# Patient Record
Sex: Female | Born: 1970 | Hispanic: Yes | Marital: Married | Smoking: Never smoker
Health system: Southern US, Community
[De-identification: ages and names within clinical notes are randomized; demographics above are authoritative.]

## PROBLEM LIST (undated history)

## (undated) DIAGNOSIS — N809 Endometriosis, unspecified: Secondary | ICD-10-CM

## (undated) DIAGNOSIS — N2889 Other specified disorders of kidney and ureter: Secondary | ICD-10-CM

## (undated) DIAGNOSIS — T4145XA Adverse effect of unspecified anesthetic, initial encounter: Secondary | ICD-10-CM

## (undated) DIAGNOSIS — R112 Nausea with vomiting, unspecified: Secondary | ICD-10-CM

## (undated) DIAGNOSIS — Z905 Acquired absence of kidney: Secondary | ICD-10-CM

## (undated) DIAGNOSIS — R002 Palpitations: Secondary | ICD-10-CM

## (undated) DIAGNOSIS — Z9889 Other specified postprocedural states: Secondary | ICD-10-CM

## (undated) DIAGNOSIS — M1990 Unspecified osteoarthritis, unspecified site: Secondary | ICD-10-CM

## (undated) DIAGNOSIS — R0602 Shortness of breath: Secondary | ICD-10-CM

## (undated) DIAGNOSIS — R5383 Other fatigue: Secondary | ICD-10-CM

## (undated) DIAGNOSIS — J45909 Unspecified asthma, uncomplicated: Secondary | ICD-10-CM

## (undated) DIAGNOSIS — K219 Gastro-esophageal reflux disease without esophagitis: Secondary | ICD-10-CM

## (undated) DIAGNOSIS — T8859XA Other complications of anesthesia, initial encounter: Secondary | ICD-10-CM

## (undated) HISTORY — DX: Other fatigue: R53.83

## (undated) HISTORY — PX: OTHER SURGICAL HISTORY: SHX169

## (undated) HISTORY — PX: CHOLECYSTECTOMY: SHX55

## (undated) HISTORY — DX: Other specified disorders of kidney and ureter: N28.89

## (undated) HISTORY — DX: Other specified postprocedural states: Z98.890

## (undated) HISTORY — PX: TUBAL LIGATION: SHX77

## (undated) HISTORY — DX: Other specified postprocedural states: R11.2

## (undated) HISTORY — DX: Palpitations: R00.2

## (undated) HISTORY — DX: Gastro-esophageal reflux disease without esophagitis: K21.9

## (undated) HISTORY — PX: TOTAL ABDOMINAL HYSTERECTOMY: SHX209

## (undated) HISTORY — DX: Shortness of breath: R06.02

## (undated) HISTORY — PX: CERVICAL DISC SURGERY: SHX588

## (undated) HISTORY — DX: Acquired absence of kidney: Z90.5

## (undated) HISTORY — PX: ABDOMINAL HYSTERECTOMY: SHX81

---

## 1998-08-31 ENCOUNTER — Other Ambulatory Visit: Admission: RE | Admit: 1998-08-31 | Discharge: 1998-08-31 | Payer: Self-pay | Admitting: Gynecology

## 1998-11-25 ENCOUNTER — Ambulatory Visit (HOSPITAL_COMMUNITY): Admission: RE | Admit: 1998-11-25 | Discharge: 1998-11-25 | Payer: Self-pay | Admitting: Gastroenterology

## 1999-07-25 ENCOUNTER — Other Ambulatory Visit: Admission: RE | Admit: 1999-07-25 | Discharge: 1999-07-25 | Payer: Self-pay | Admitting: Gynecology

## 1999-12-04 ENCOUNTER — Inpatient Hospital Stay (HOSPITAL_COMMUNITY): Admission: AD | Admit: 1999-12-04 | Discharge: 1999-12-04 | Payer: Self-pay | Admitting: Obstetrics and Gynecology

## 2000-01-15 ENCOUNTER — Inpatient Hospital Stay (HOSPITAL_COMMUNITY): Admission: AD | Admit: 2000-01-15 | Discharge: 2000-01-17 | Payer: Self-pay | Admitting: Obstetrics and Gynecology

## 2000-02-18 ENCOUNTER — Observation Stay (HOSPITAL_COMMUNITY): Admission: AD | Admit: 2000-02-18 | Discharge: 2000-02-18 | Payer: Self-pay | Admitting: Obstetrics & Gynecology

## 2000-02-20 ENCOUNTER — Ambulatory Visit (HOSPITAL_COMMUNITY): Admission: RE | Admit: 2000-02-20 | Discharge: 2000-02-20 | Payer: Self-pay | Admitting: Obstetrics & Gynecology

## 2000-02-22 ENCOUNTER — Inpatient Hospital Stay (HOSPITAL_COMMUNITY): Admission: AD | Admit: 2000-02-22 | Discharge: 2000-02-24 | Payer: Self-pay | Admitting: Obstetrics and Gynecology

## 2000-02-25 ENCOUNTER — Encounter: Admission: RE | Admit: 2000-02-25 | Discharge: 2000-05-25 | Payer: Self-pay | Admitting: Obstetrics & Gynecology

## 2000-05-26 ENCOUNTER — Encounter: Admission: RE | Admit: 2000-05-26 | Discharge: 2000-08-24 | Payer: Self-pay | Admitting: Obstetrics & Gynecology

## 2000-08-26 ENCOUNTER — Encounter: Admission: RE | Admit: 2000-08-26 | Discharge: 2000-11-24 | Payer: Self-pay | Admitting: Obstetrics & Gynecology

## 2001-02-16 ENCOUNTER — Encounter: Payer: Self-pay | Admitting: Emergency Medicine

## 2001-02-16 ENCOUNTER — Emergency Department (HOSPITAL_COMMUNITY): Admission: EM | Admit: 2001-02-16 | Discharge: 2001-02-16 | Payer: Self-pay | Admitting: Emergency Medicine

## 2001-04-24 ENCOUNTER — Inpatient Hospital Stay (HOSPITAL_COMMUNITY): Admission: AD | Admit: 2001-04-24 | Discharge: 2001-04-24 | Payer: Self-pay | Admitting: Obstetrics and Gynecology

## 2001-04-24 ENCOUNTER — Encounter: Payer: Self-pay | Admitting: Obstetrics and Gynecology

## 2001-04-24 ENCOUNTER — Ambulatory Visit (HOSPITAL_COMMUNITY): Admission: RE | Admit: 2001-04-24 | Discharge: 2001-04-24 | Payer: Self-pay | Admitting: Obstetrics and Gynecology

## 2001-04-26 ENCOUNTER — Ambulatory Visit (HOSPITAL_COMMUNITY): Admission: AD | Admit: 2001-04-26 | Discharge: 2001-04-26 | Payer: Self-pay | Admitting: *Deleted

## 2001-04-26 ENCOUNTER — Encounter (INDEPENDENT_AMBULATORY_CARE_PROVIDER_SITE_OTHER): Payer: Self-pay

## 2001-05-29 ENCOUNTER — Inpatient Hospital Stay (HOSPITAL_COMMUNITY): Admission: AD | Admit: 2001-05-29 | Discharge: 2001-05-29 | Payer: Self-pay | Admitting: Obstetrics and Gynecology

## 2001-06-05 ENCOUNTER — Encounter: Payer: Self-pay | Admitting: Obstetrics and Gynecology

## 2001-06-05 ENCOUNTER — Ambulatory Visit (HOSPITAL_COMMUNITY): Admission: RE | Admit: 2001-06-05 | Discharge: 2001-06-05 | Payer: Self-pay | Admitting: Obstetrics and Gynecology

## 2001-07-17 ENCOUNTER — Encounter (INDEPENDENT_AMBULATORY_CARE_PROVIDER_SITE_OTHER): Payer: Self-pay

## 2001-07-17 ENCOUNTER — Ambulatory Visit (HOSPITAL_COMMUNITY): Admission: AD | Admit: 2001-07-17 | Discharge: 2001-07-17 | Payer: Self-pay | Admitting: Obstetrics and Gynecology

## 2001-07-17 ENCOUNTER — Encounter: Payer: Self-pay | Admitting: Obstetrics and Gynecology

## 2001-11-05 HISTORY — PX: LAPAROSCOPIC ASSISTED VAGINAL HYSTERECTOMY: SHX5398

## 2002-06-09 ENCOUNTER — Observation Stay (HOSPITAL_COMMUNITY): Admission: RE | Admit: 2002-06-09 | Discharge: 2002-06-10 | Payer: Self-pay | Admitting: Obstetrics and Gynecology

## 2002-06-09 ENCOUNTER — Encounter (INDEPENDENT_AMBULATORY_CARE_PROVIDER_SITE_OTHER): Payer: Self-pay | Admitting: Specialist

## 2003-05-17 ENCOUNTER — Encounter (INDEPENDENT_AMBULATORY_CARE_PROVIDER_SITE_OTHER): Payer: Self-pay | Admitting: Specialist

## 2003-05-17 ENCOUNTER — Observation Stay (HOSPITAL_COMMUNITY): Admission: RE | Admit: 2003-05-17 | Discharge: 2003-05-18 | Payer: Self-pay | Admitting: Obstetrics and Gynecology

## 2003-05-26 ENCOUNTER — Ambulatory Visit (HOSPITAL_COMMUNITY): Admission: RE | Admit: 2003-05-26 | Discharge: 2003-05-26 | Payer: Self-pay | Admitting: Obstetrics and Gynecology

## 2003-05-26 ENCOUNTER — Encounter: Payer: Self-pay | Admitting: Obstetrics and Gynecology

## 2003-07-09 ENCOUNTER — Ambulatory Visit (HOSPITAL_COMMUNITY): Admission: RE | Admit: 2003-07-09 | Discharge: 2003-07-09 | Payer: Self-pay | Admitting: Gastroenterology

## 2003-07-09 ENCOUNTER — Encounter: Payer: Self-pay | Admitting: Gastroenterology

## 2004-02-28 ENCOUNTER — Encounter: Admission: RE | Admit: 2004-02-28 | Discharge: 2004-02-28 | Payer: Self-pay | Admitting: *Deleted

## 2004-08-25 ENCOUNTER — Emergency Department (HOSPITAL_COMMUNITY): Admission: EM | Admit: 2004-08-25 | Discharge: 2004-08-25 | Payer: Self-pay | Admitting: Family Medicine

## 2004-08-28 ENCOUNTER — Emergency Department (HOSPITAL_COMMUNITY): Admission: EM | Admit: 2004-08-28 | Discharge: 2004-08-28 | Payer: Self-pay | Admitting: Family Medicine

## 2004-11-14 ENCOUNTER — Ambulatory Visit: Payer: Self-pay | Admitting: Internal Medicine

## 2004-11-28 ENCOUNTER — Ambulatory Visit: Payer: Self-pay | Admitting: Gastroenterology

## 2004-12-01 ENCOUNTER — Ambulatory Visit: Payer: Self-pay | Admitting: Internal Medicine

## 2004-12-22 ENCOUNTER — Ambulatory Visit: Payer: Self-pay | Admitting: Gastroenterology

## 2004-12-22 ENCOUNTER — Encounter: Payer: Self-pay | Admitting: Internal Medicine

## 2005-01-25 ENCOUNTER — Ambulatory Visit: Payer: Self-pay | Admitting: Internal Medicine

## 2005-03-27 ENCOUNTER — Ambulatory Visit: Payer: Self-pay | Admitting: Internal Medicine

## 2005-08-03 ENCOUNTER — Ambulatory Visit (HOSPITAL_COMMUNITY): Admission: RE | Admit: 2005-08-03 | Discharge: 2005-08-04 | Payer: Self-pay | Admitting: Neurological Surgery

## 2005-08-28 ENCOUNTER — Encounter: Admission: RE | Admit: 2005-08-28 | Discharge: 2005-08-28 | Payer: Self-pay | Admitting: Neurological Surgery

## 2005-08-30 ENCOUNTER — Ambulatory Visit: Payer: Self-pay | Admitting: Internal Medicine

## 2005-11-22 ENCOUNTER — Encounter: Admission: RE | Admit: 2005-11-22 | Discharge: 2005-11-22 | Payer: Self-pay | Admitting: Neurological Surgery

## 2005-11-30 ENCOUNTER — Encounter: Admission: RE | Admit: 2005-11-30 | Discharge: 2005-11-30 | Payer: Self-pay | Admitting: Neurological Surgery

## 2006-01-22 ENCOUNTER — Ambulatory Visit: Payer: Self-pay | Admitting: Internal Medicine

## 2006-01-31 ENCOUNTER — Ambulatory Visit: Payer: Self-pay | Admitting: Internal Medicine

## 2006-02-12 ENCOUNTER — Ambulatory Visit: Payer: Self-pay | Admitting: Internal Medicine

## 2006-02-16 IMAGING — CR DG CERVICAL SPINE 1V
1 series · 1 of 1 positions shown · non-contrast
Comparison: [REDACTED] Intraoperative cervical spine radiograph, 08/03/05.

CLINICAL DATA: Post ACDF 08/03/05 with head and neck burning sensation.  Dizziness. 
 DIAGNOSTIC CERVICAL SPINE ? ONE VIEW:

[w c-spine lat]
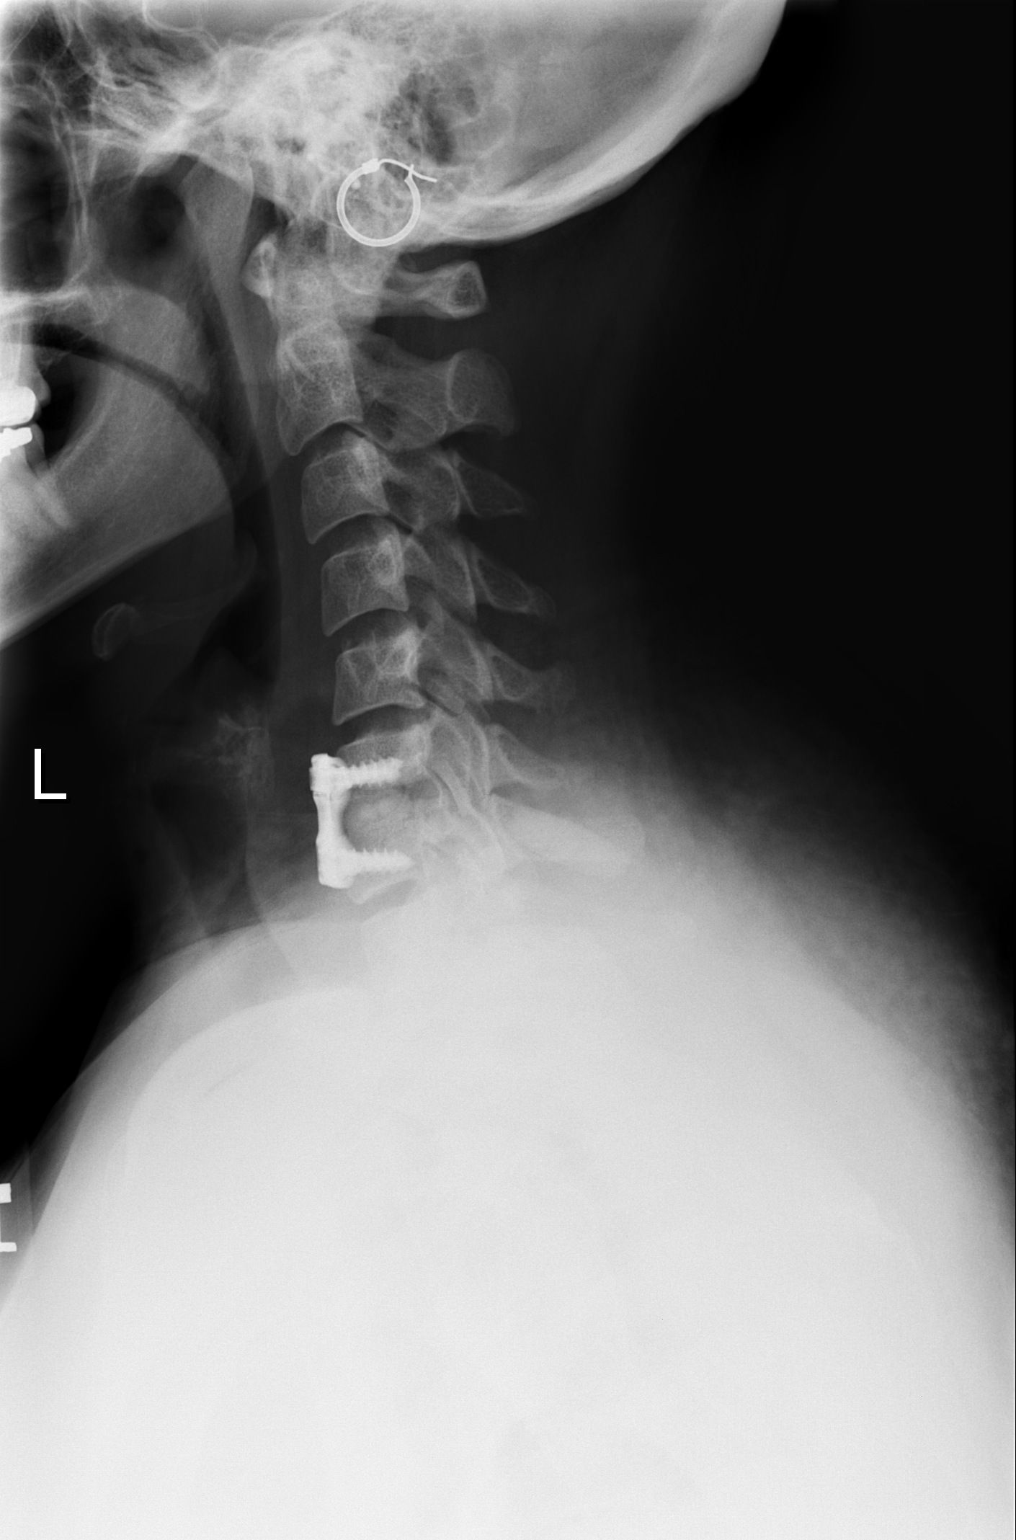

[1 of 1 positions shown; findings below may reference images not displayed]

FINDINGS: Straightening of the normal cervical lordosis of positioning and/or muscle spasm is seen.  No change in anterior cervical diskectomy fusion hardware in satisfactory position and interbody bone plug at C6-7.  The remaining cervical disk spaces and posterior vertebral alignment are normally maintained with no abnormal prevertebral soft tissue swelling.
IMPRESSION: 1.  Stable satisfactory anterior cervical diskectomy fusion C6-7. 
 2.  Otherwise negative.

## 2006-05-06 ENCOUNTER — Ambulatory Visit: Payer: Self-pay | Admitting: Internal Medicine

## 2006-11-26 ENCOUNTER — Ambulatory Visit: Payer: Self-pay | Admitting: Internal Medicine

## 2006-12-24 ENCOUNTER — Ambulatory Visit: Payer: Self-pay | Admitting: *Deleted

## 2007-06-05 ENCOUNTER — Telehealth (INDEPENDENT_AMBULATORY_CARE_PROVIDER_SITE_OTHER): Payer: Self-pay

## 2007-06-10 ENCOUNTER — Ambulatory Visit: Payer: Self-pay | Admitting: Internal Medicine

## 2007-06-10 DIAGNOSIS — F909 Attention-deficit hyperactivity disorder, unspecified type: Secondary | ICD-10-CM | POA: Insufficient documentation

## 2007-07-23 ENCOUNTER — Encounter (INDEPENDENT_AMBULATORY_CARE_PROVIDER_SITE_OTHER): Payer: Self-pay | Admitting: *Deleted

## 2007-07-25 ENCOUNTER — Emergency Department (HOSPITAL_COMMUNITY): Admission: EM | Admit: 2007-07-25 | Discharge: 2007-07-25 | Payer: Self-pay | Admitting: Emergency Medicine

## 2007-07-26 ENCOUNTER — Ambulatory Visit (HOSPITAL_COMMUNITY): Admission: RE | Admit: 2007-07-26 | Discharge: 2007-07-26 | Payer: Self-pay | Admitting: Emergency Medicine

## 2007-12-22 ENCOUNTER — Telehealth: Payer: Self-pay | Admitting: Internal Medicine

## 2008-04-20 ENCOUNTER — Ambulatory Visit: Payer: Self-pay | Admitting: Internal Medicine

## 2008-04-20 DIAGNOSIS — J45991 Cough variant asthma: Secondary | ICD-10-CM | POA: Insufficient documentation

## 2008-04-20 DIAGNOSIS — J45909 Unspecified asthma, uncomplicated: Secondary | ICD-10-CM | POA: Insufficient documentation

## 2008-04-20 DIAGNOSIS — R945 Abnormal results of liver function studies: Secondary | ICD-10-CM | POA: Insufficient documentation

## 2008-04-20 DIAGNOSIS — F411 Generalized anxiety disorder: Secondary | ICD-10-CM | POA: Insufficient documentation

## 2008-04-21 LAB — CONVERTED CEMR LAB
ALT: 30 units/L (ref 0–35)
AST: 19 units/L (ref 0–37)
Albumin: 4 g/dL (ref 3.5–5.2)
Total Protein: 6.2 g/dL (ref 6.0–8.3)

## 2008-04-27 ENCOUNTER — Ambulatory Visit (HOSPITAL_COMMUNITY): Admission: RE | Admit: 2008-04-27 | Discharge: 2008-04-27 | Payer: Self-pay | Admitting: Obstetrics & Gynecology

## 2008-04-27 ENCOUNTER — Ambulatory Visit: Payer: Self-pay | Admitting: Gastroenterology

## 2008-05-05 ENCOUNTER — Ambulatory Visit: Payer: Self-pay | Admitting: Gastroenterology

## 2008-05-11 ENCOUNTER — Ambulatory Visit (HOSPITAL_COMMUNITY): Admission: RE | Admit: 2008-05-11 | Discharge: 2008-05-11 | Payer: Self-pay | Admitting: Obstetrics & Gynecology

## 2008-07-05 ENCOUNTER — Telehealth: Payer: Self-pay | Admitting: Internal Medicine

## 2008-09-14 ENCOUNTER — Telehealth: Payer: Self-pay | Admitting: Internal Medicine

## 2008-11-12 ENCOUNTER — Telehealth: Payer: Self-pay | Admitting: Internal Medicine

## 2008-12-13 ENCOUNTER — Telehealth (INDEPENDENT_AMBULATORY_CARE_PROVIDER_SITE_OTHER): Payer: Self-pay

## 2009-02-07 ENCOUNTER — Telehealth: Payer: Self-pay | Admitting: Internal Medicine

## 2009-05-11 ENCOUNTER — Telehealth: Payer: Self-pay | Admitting: Internal Medicine

## 2009-09-07 ENCOUNTER — Ambulatory Visit (HOSPITAL_COMMUNITY): Payer: Self-pay | Admitting: Psychiatry

## 2009-09-13 DIAGNOSIS — G4700 Insomnia, unspecified: Secondary | ICD-10-CM | POA: Insufficient documentation

## 2010-01-17 ENCOUNTER — Ambulatory Visit (HOSPITAL_COMMUNITY): Payer: Self-pay | Admitting: Psychiatry

## 2010-04-21 ENCOUNTER — Ambulatory Visit (HOSPITAL_COMMUNITY): Payer: Self-pay | Admitting: Psychiatry

## 2010-08-29 DIAGNOSIS — K219 Gastro-esophageal reflux disease without esophagitis: Secondary | ICD-10-CM | POA: Diagnosis present

## 2010-12-06 ENCOUNTER — Ambulatory Visit (HOSPITAL_COMMUNITY): Admit: 2010-12-06 | Payer: Self-pay | Admitting: Psychiatry

## 2010-12-06 ENCOUNTER — Encounter (HOSPITAL_COMMUNITY): Payer: Self-pay | Admitting: Physician Assistant

## 2011-03-12 ENCOUNTER — Other Ambulatory Visit: Payer: Self-pay | Admitting: Nurse Practitioner

## 2011-03-12 DIAGNOSIS — J329 Chronic sinusitis, unspecified: Secondary | ICD-10-CM

## 2011-03-12 DIAGNOSIS — F4321 Adjustment disorder with depressed mood: Secondary | ICD-10-CM | POA: Insufficient documentation

## 2011-03-12 DIAGNOSIS — IMO0002 Reserved for concepts with insufficient information to code with codable children: Secondary | ICD-10-CM

## 2011-03-16 ENCOUNTER — Other Ambulatory Visit: Payer: Self-pay

## 2011-03-23 NOTE — Op Note (Signed)
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   PROCEDURES:  1.  Decompressive anterior cervical diskectomy and fusion, C6-7.  2.  Anterior cervical arthrodesis, C6-7, utilizing a 7 mm VG2 allograft.  3.  Anterior cervical plating utilizing a 22 mm Accufix plate.   SURGEON:  Tia Alert, M.D.   ANESTHESIA:  General endotracheal.   COMPLICATIONS:  None apparent.   INDICATION FOR PROCEDURE:  [name] is a 40 year old female who was  referred with neck pain with bilateral arm pain, right greater than left.  She had an MRI which showed significant spinal stenosis at C6-7 from a broad-  based disk herniation with cord compression.  At C5-6 she had some  spondylosis with a broad-based disk bulge but still CSF posterior to the  cord and no significant cord compression.  I recommended a decompressive  anterior cervical diskectomy and fusion with plating at C6-7, which was much  more significantly involved level on the preop imaging.  She understood the  risks, benefits and expected outcome and wished to proceed with the surgery.   DESCRIPTION OF PROCEDURE:  The patient was taken to the operating room and  after induction of adequate generalized endotracheal anesthesia, she was  placed in the supine position on the operating room table.  Her right  anterior cervical region was prepped with DuraPrep and then draped in the  usual sterile fashion.  Local anesthesia 3 mL was injected and then an  incision was  made to the right of midline and carried down to the platysma  muscle, which was elevated, opened and undermined with Metzenbaum scissors.  I then dissected in a plane medial to the sternocleidomastoid muscle and  internal carotid artery and lateral to the trachea and esophagus to expose  the anterior cervical spine at C6-7.  Intraoperative fluoroscopy confirmed  my level and then the longus colli muscles were taken down and the Shadow  Line retractors were placed under this.  The anterior annulus was incised  with a 15 blade scalpel and the initial diskectomy was done with pituitary  rongeurs and curved curettes.  I then used the high-speed drill to drill the  end plates down to the level of the posterior longitudinal ligament.  The  operating microscope was brought onto the field and the posterior  longitudinal ligament was opened with a nerve hook and then removed in a  circumferential fashion along with the posterior osteophytes at C6-7.  I was  able to undercut the inferior end plate of C6 and the superior end plate of  C7  to decompress the canal.  I was able to identify the C7 nerve roots  bilaterally and decompress them into their neural foramina.  The pedicles  were easily palpable, and the nerves were visualized bilaterally and were  well-decompressed.  Once the decompression was completed, I irrigated with  saline solution containing bacitracin.  I measured the interspace and then  placed a 7 mm VG2 allograft into the interspace.  I then used a 22 mm  Accufix plate and placed two 13 mm variable-angled screws into the body of  C6 and two in the body of C7.  These were locked into position by the  locking mechanism and the plate.  I then irrigated with saline solution  containing bacitracin and dried all bleeding points with bipolar cautery and  once meticulous hemostasis was achieved closed the platysma with 3-0 Vicryl  and closed the subcuticular tissues with 3-0 Vicryl, close the  skin with  Benzoin and Steri-Strips.  A sterile dressing was applied.  The patient was  awakened from general anesthesia and transferred to the recovery room in  stable condition.  At the end of the procedure all sponge, needle and  instrument counts were correct.      Tia Alert, MD  Electronically Signed     DSJ/MEDQ  D:  08/03/2005  T:  08/03/2005  Job:  [id number]

## 2011-03-23 NOTE — Discharge Summary (Signed)
   NAME:  [name], [name]                           ACCOUNT NO.:  [id number]   MEDICAL RECORD NO.:  [id number]                   PATIENT TYPE:  OBV   LOCATION:  9109                                 FACILITY:  [facility]   PHYSICIAN:  Dineen Kid. Rana Snare, M.D.                 DATE OF BIRTH:  04/27/1971   DATE OF ADMISSION:  05/17/2003  DATE OF DISCHARGE:  05/18/2003                                 DISCHARGE SUMMARY   HISTORY OF PRESENT ILLNESS:  [name] is a 40 year old G7, P3, A4 with  pelvic pain and bilateral __________ with history of adhesions,  endometriosis, also dyspareunia.  Ultrasound showed a right hydrosalpinx.  Unresponsive to medical management, she presented for laparoscopic surgical  evaluation and treatment.   HOSPITAL COURSE:  The patient underwent diagnostic laparoscopy with lysis of  adhesions and right salpingectomy.  The procedure was  __________.  Postoperative care was complicated by persistent postoperative nausea,  unable to discharge from the post anesthetic care unit.  The patient was  placed on antiemetics and IV fluids.  By postop day #1, she __________  midnight and tolerating a regular diet, abdomen soft, nontender,  nondistended, __________ and had normoactive bowel sounds.  The patient was  discharged in improved condition .  She will follow up in the office in 2 to  3 weeks and was given a prescription for Vicodin, #30, and Phenergan 12.5  mg, #12.  She was told to call for increased nausea, vomiting, pain,  bleeding __________ .                                               Dineen Kid Rana Snare, M.D.    DCL/MEDQ  D:  05/18/2003  T:  05/18/2003  Job:  [id number]

## 2011-03-23 NOTE — Discharge Summary (Signed)
[facility]  Patient:    [name], [name]                        MRN: [id number] Adm. Date:  [id number] Disc. Date: [id number] Attending:  Mickle Mallory Dictator:   Leilani Able, P.A.                           Discharge Summary  FINAL DIAGNOSES:              1. 30+ week gestation.                               2. Preterm contractions.  HISTORY OF PRESENT ILLNESS:   This 40 year old, gravida 4, para 2-0-1-2, presents around 30 weeks with painful contractions.  The patient has currently been on Procardia for this problem, but contractions began the evening of March 11.  In the ER they are every two to four minutes and her cervix was still long and closed.  She is admitted at this time for subcu Terbutaline, IV fluids, and to check some labs.  HOSPITAL COURSE:              The patient subcu Terbutaline did not help the patient.  She was admitted for magnesium sulfate.  She was started on betamethasone protocol.  By March 13, the patient was having fewer contractions.  She was weaned off the magnesium sulfate and started on Terbutaline 5 mg one every four hours.  She was stable on this protocol and she had her second betamethasone on March 13. She was feeling better by hospital day #2.  Upon discharge, the patients cervix  was no change.  It was still closed and about 50% effaced.  She was sent home on hospital day #2 on Terbutaline 5 mg #60 one every four hours as needed with some refills, discussed work and rest.  She is to continue prenatal vitamins and follow up in the office next week.  The patient was also to call if contractions resumed. DD:  03/10/00 TD:  03/11/00 Job: [id number]

## 2011-03-23 NOTE — Discharge Summary (Signed)
   NAME:  [name], [name]                           ACCOUNT NO.:  AO   MEDICAL RECORD NO.:  [id number]                   PATIENT TYPE:  OBV   LOCATION:  9305                                 FACILITY:  [facility]   PHYSICIAN:  Dineen Kid. Rana Snare, M.D.                 DATE OF BIRTH:  Jul 15, 1971   DATE OF ADMISSION:  06/09/2002  DATE OF DISCHARGE:  06/10/2002                                 DISCHARGE SUMMARY   HISTORY OF PRESENT ILLNESS:  The patient is a 40 year old G6, P3, A3, with  worsening pelvic pain, menometrorrhagia, known fibroids, not responsive to  conservative medical management, who presented for definitive surgery,  planned hysterectomy.   HOSPITAL COURSE:  The patient underwent laparoscopically assisted vaginal  hysterectomy, also ablation of endometriosis implants and lysis of  adhesions.  Surgery was uncomplicated, however, she did have an estimated  blood loss of 500 cc.  Her postoperative course was unremarkable with good  return of bowel function and ambulation on postoperative day #1.  Her  postoperative hemoglobin was 11.2 and by the end of postoperative day #1,  she was ambulating without difficulty, tolerating a regular diet, passing  flatus and is discharged home.   DISPOSITION:  The patient will follow up in the office in two weeks.   DISCHARGE MEDICATIONS:  She is sent home with a prescription for Vicodin,  #30.   DISCHARGE INSTRUCTIONS:  Instructed to return for increased fever, pain or  bleeding.   CONDITION ON DISCHARGE:  Discharge condition is good.                                               Dineen Kid Rana Snare, M.D.    DCL/MEDQ  D:  06/10/2002  T:  06/13/2002  Job:  [id number]

## 2011-03-23 NOTE — Op Note (Signed)
[facility]  Patient:    [name], [name]                        MRN: [id number] Proc. Date: 02/22/00 Adm. Date:  [id number] Attending:  Mickle Mallory                           Operative Report  PATIENTS AGE:                40.  PREOPERATIVE DIAGNOSES:       Thirty-six to 37-week intrauterine pregnancy, positive maturity studies, previous cesarean section x 2, extreme maternal discomfort, preterm contractions, positive group B streptococcus antenatally.  POSTOPERATIVE DIAGNOSES:      Thirty-six to 37-week intrauterine pregnancy, positive maturity studies, previous cesarean section x 2, extreme maternal discomfort, preterm contractions, positive group B streptococcus antenatally, delivery of 6-pound 9-ounce female infant, Apgars 8/9.  PROCEDURE:                    Repeat low transverse cesarean section.  SURGEON:                      Gerrit Friends. Aldona Bar, M.D.  ANESTHESIA:                   Subarachnoid block, Gretta Cool., M.D.  HISTORY:                      This 40 year old gravida 4, para 2, who has had two previous cesarean sections, has had extreme maternal discomfort almost during this entire pregnancy and as well, has complained of frequent contractions, necessitating the use of tocolytic medication.  Because of the desire to deliver her as soon as the baby was mature, an amniocentesis was carried out on February 20, 2000 and fortunately, maturity studies were positive.  She is taken to the operating room now for repeat low transverse cesarean section for delivery.  DESCRIPTION OF PROCEDURE:     Patient was taken to the operating room where a spinal anesthetic was placed by Dr. Harvest Forest.  Thereafter, she was placed in a supine position, slightly tilted to the left, and prepped and draped in usual fashion or cesarean section.  A Foley catheter was inserted as part of the prep.  After she was adequately draped, anesthesia levels  were checked and found to be  adequate and procedure was begun.  A Pfannenstiel incision was made and with minimal difficulty, dissected down sharply to and through the fascia in the low  transverse fashion, with hemostasis created at each layer.  Subfascial space was created inferiorly and superiorly, muscles separated in the midline, peritoneum  identified and entered appropriately, with care taken to avoid the bowel superiorly and the bladder inferiorly.  At this time, the vesicouterine peritoneum was incised in a low transverse fashion and pushed off the lower uterine segment with ease.  Sharp incision into the uterus in a low transverse fashion was then made with the Metzenbaum scissors and then extended with the fingers.  Amniotomy was carried ut with production of clear fluid and thereafter, a viable female infant, which cried spontaneously at once, was delivered from the vertex presentation.  Apgars were  noted to be 8/9.  The infant was taken to the nursery in good condition.  Upon clamping the cord, it was noticed that there was a  true knot in the cord.  The cord had three vessels -- two arteries and one vein.  The placenta was delivered intact without difficulty and afterwards, the uterus was exteriorized.  The uterus was rendered free of any remaining products of conception and then the uterine incision was closed using a single layer of #1 Vicryl in a  running-locking fashion and this was reinforced with a figure-of-eight #1 Vicryl in the midportion to control slight oozing.  At this time, the uterus was noted to be well-contracted.  The uterine incision was dry.  Tubes and ovaries were inspected and noted to be normal.  After the abdomen was lavaged of all free blood and clot, the uterus was replaced into the abdomen and closure of the abdomen was begun in layers.  At this time, all foreign bodies were noted to be out of the abdominal  cavity and all counts  were noted to be correct.  The abdominal peritoneum was closed with 0 Vicryl in a running fashion and muscle secured with same.  Assured of good subfascial hemostasis, the fascia was then reapproximated using 0 Vicryl from angle-to-midline bilaterally.  At this time,  subcu tissue was rendered hemostatic and staples were then used to close the skin. A sterile pressure dressing was applied and the patient at this time was transported to the recovery room in satisfactory condition, having tolerated the procedure well.  Estimated blood loss:  500 cc.  All counts were correct x 2. t the conclusion of the procedure, both mother and baby were doing well in their respective recovery areas.  Mother had good clear urine output during the procedure.  DD:  02/22/00 TD:  02/22/00 Job: [id number]

## 2011-03-23 NOTE — Op Note (Signed)
NAMEANALYN, [name]                           ACCOUNT NO.:  [id number]   MEDICAL RECORD NO.:  [id number]                   PATIENT TYPE:  OBV   LOCATION:  9305                                 FACILITY:  [facility]   PHYSICIAN:  Dineen Kid. Rana Snare, M.D.                 DATE OF BIRTH:  July 15, 1971   DATE OF PROCEDURE:  DATE OF DISCHARGE:                                 OPERATIVE REPORT   PREOPERATIVE DIAGNOSES:  Menometrorrhagia, pelvic pain, fibroids.   POSTOPERATIVE DIAGNOSES:  Menometrorrhagia, pelvic pain, fibroids,  endometriosis.   PROCEDURE:  Laparoscopically assisted vaginal hysterectomy and ablation of  endometriosis implants.   SURGEON:  Dineen Kid. Rana Snare, M.D.   ASSISTANT:  Guy Sandifer. Henderson Cloud, M.D.   ANESTHESIA:  General endotracheal.   INDICATIONS:  The patient is a 40 year old G6, P3 with history of several  ectopics, a left salpingectomy, recently a right salpingostomy.  She has no  future childbearing desires.  She is also continuing to have  menometrorrhagia, pelvic pain which is not responsive to conservative  medical management which has included Depo-Provera, oral contraceptive  agents, anti-inflammatory medications, and currently Vicodin.  She presents  today for definitive surgical treatment, planned hysterectomy.  Risks and  benefits were discussed at length.  Informed consent was obtained.  See  history and physical for further details.   FINDINGS:  Pelvic adhesions to the left tubo-ovarian complex, mild  endometriosis in the cul-de-sac, normal appearing right and left ovary,  normal appearing liver.   DESCRIPTION OF PROCEDURE:  After adequate analgesia with the patient placed  in the dorsal lithotomy position, she is sterilely prepped and draped.  The  bladder was sterilely drained.  Graves speculum was placed and a Hulka  tenaculum was placed in the anterior lip of the cervix.  A 1 cm incision was  made through the previous incision superior to the umbilicus.  The  scar  tissue was taken down sharply.  Veress needle was inserted.  The abdomen as  insufflated to dullness to percussion.  An 11 mm trocar was inserted.  The  laparoscope was inserted and the above findings were noted.  A 5 mm trocar  was placed to the left of midline two fingerbreadths above the pubic  symphysis under direct visualization.  Endometriosis implants were ablated  with bipolar cautery with good thermal burn throughout the entirety of the  implants.  The pelvic adhesions were taken down sharply from both left  ovarian complex and the remnant of the fallopian tube.  The round ligaments  were identified, ligated, and cut.  The utero-ovarian ligaments were then  ligated and cut down across the round ligaments bilaterally with good  hemostasis achieved, the ovaries appearing to be normal.  The bladder was  then dissected off the anterior surface of the cervix.  At this time the  laparoscope was removed.  Abdomen was desufflated.  Weighted  speculum was  placed in the vagina.  A Jacobs tenaculum was placed in the cervix.  Cervix  was circumscribed with Bovie cautery.  A posterior colpotomy was performed.  Jyrus clamp was placed on the uterosacral ligaments bilaterally, ligated,  and cut.  This was done similar to the bladder pillars and also the uterine  vasculature at the inferior portion of the uterus.  At this time the bladder  was dissected off the anterior surface of the cervix.  A Deaver retractor  was placed underneath the bladder.  Successive bites across the uterine  vasculature across the inferior portion of the broad ligament was performed  using the Jyrus and also Mayo scissors.  Femoral burn noted.  At this time  the __________ tenaculum was placed on the fundus of the uterus.  It was  delivered into the introitus and remaining pedicles ligated and cut.  The  uterus was passed off the table.  At this time the uterosacral ligaments  were identified, bilaterally sutured,  ligated with figure-of-eight of 0  Monocryl suture.  Small __________ pack was placed in the posterior fornix  and the posterior peritoneum was then closed in a purse-string fashion with  0 Monocryl.  Posterior vagina was then closed in a vertical fashion using  figure-of-eight of 0 Monocryl with good approximation, good hemostasis.  Uterosacral ligaments were plicated in the midline.  The packing was then  removed and the anterior vaginal mucosa was closed in a similar fashion in a  vertical fashion using figure-of-eight of 0 Monocryl with good  approximation, good hemostasis.  Foley catheter was placed.  Because of the  extensive dissection of the bladder off the uterus, indigo carmine had to be  given to the patient.  Foley catheter when placed revealed clear blue urine  and no evidence of leaking, bleeding are noted.  Reinsufflation of the  abdomen at this time.  A suction irrigator was used.  Areas of moderate  bleeding on the peritoneal edges in the tip of the left ovarian pedicle were  ligated with the Jyrus bipolar cautery.  After a copious amount of  irrigation adequate hemostasis was assured.  The abdomen was desufflated,  the trocars removed.  The supraumbilical incision was undermined with  Metzenbaum scissors through the previous scar tissue since the patient had  undesirable cosmetic effect from the previous laparotomy.  After the scar  tissue was dissected a 0 Vicryl was placed through the fascia in an  interrupted suture.  The incision was then closed with 3-0 Vicryl Rapide in  a subcuticular stitch.  The 5 mm trocar site was closed in a similar fashion  with 3-0 Vicryl Rapide in a subcuticular stitch.  The incision was injected  with 0.25% Marcaine 200 cc.  The patient tolerated the procedure well.  Was  stable on transfer to the recovery room.  The sponge, needle, and instrument  count was normal x3.  The patient received 1 g of Cefotetan preoperatively. Estimated blood  loss from the procedure was 500 cc.                                               Dineen Kid Rana Snare, M.D.    DCL/MEDQ  D:  06/09/2002  T:  06/12/2002  Job:  [id number]

## 2011-03-23 NOTE — H&P (Signed)
NAME:  [name], [name]                           ACCOUNT NO.:  [id number]   MEDICAL RECORD NO.:  [id number]                   PATIENT TYPE:  AMB   LOCATION:  [facility]C                                  FACILITY:  [facility]   PHYSICIAN:  Dineen Kid. Rana Snare, M.D.                 DATE OF BIRTH:  01/19/1971   DATE OF ADMISSION:  05/17/2003  DATE OF DISCHARGE:                                HISTORY & PHYSICAL   HISTORY OF PRESENT ILLNESS:  [name] is a 40 year old G7, P3 with  persistent pelvic pain in bilateral quadrants with a history of  endometriosis, history of adhesions. She also has dyspareunia. All this has  not responded to conservative medical management. She previously had a  laparoscopic assisted vaginal hysterectomy in August of 2003. She reports  that the pain has increased over the last several months and desires  surgical and more aggressive intervention. She had an ultrasound on December 31, 2002 which showed right hydrosalpinx. She presents for laparoscopy with  probably removing one ovary and both fallopian tubes, depending on the  findings at the time of surgery. The patient has been on conservative  medical management and at this time, refuses Depo-Lupron, Depo-Provera,  Elavil. She is taking anti-inflammatory medications as well as narcotic type  of medications and again, wished to proceed with surgery at this time.   PAST MEDICAL HISTORY:  Negative.   PAST SURGICAL HISTORY:  1. History of 3 Cesarean sections  2. Cholecystectomy  3. Multiple laparoscopic surgeries for ectopics  4. Pelvic adhesions  5. Pelvic pain.  6. A laparoscopically assisted vaginal hysterectomy, which also included     ablation of endometrioses implants and lysis of adhesions in August of     2003.   MEDICATIONS:  Vicodin as needed.   ALLERGIES:  CODEINE AND PENICILLIN but again, can tolerate hydrocodone and  oxycodone.   PHYSICAL EXAMINATION:  VITAL SIGNS: Blood pressure is 118/76.  HEART: Regular  rate and rhythm.  LUNGS: Clear to auscultation bilaterally.  ABDOMEN: Soft, nontender and nondistended. No rebound or guarding.  PELVIC: Generalized tenderness in the left, right, and mid pelvis with a 2  out of 5 discomfort. No rebound or guarding or masses are palpable. No  uterosacral nodularity is palpable.   LABORATORY DATA:  Ultrasound in February 2004 shows a 3 x 1.6 x 2 cm ovary  in size with an 8.1 cm cystic tubular structure adjacent to the ovary. The  left adnexa shows a 2.9 x 1.5 x 1.8 cm ovary. The remaining portion of the  cul-de-sac is within normal limits.   IMPRESSION/PLAN:  Pelvic pain in bilateral lower quadrants, history of  endometriosis, history of adhesions and dyspareunia. Numerous options have  been discussed with Ceceilia. Lengthy discussion regarding different treatment  options including expected management, which she wants to do more than that  at this point. Another option  is dealing with chronic pain type of issues,  such as Elavil. She reports that she cannot take anything that makes her  groggy because she has 2 children and is also going to school and is  working. Discussed a third option such as Depo-Lupron, birth control pills  or oral contraceptives agents. She has done all this before and does not  want to be on Depo-Lupron. And then finally, discussed surgical options.  Discussed different options associated with that. I recommended that she not  remove both ovaries unless she has had a trial of Depo-Lupron and sees how  she tolerates menopause, since she does have a history of endometriosis. I  did tell her that if she does want to proceed surgically without doing Depo-  Lupron, I definitely recommend removing both of the fallopian tubes and  ablation of the endometriosis and lysis of adhesions and a possible  unilateral oophorectomy, depending upon which side appears to be worse at  that time. I am trying to preserve at least 1 ovary since she is  young. She  does understand this and she understands that this may not alleviate the  pain or the possibility that this can recur in the future and she does wish  to proceed with the latest plan discussed. Again, she does understand that  there are risks associated with surgery, which include but not limited to,  risk of infection, bleeding, damage to bowel, bladder, ureters, ovaries, the  possibility of not being able to alleviate the pain or worsen the pain or  recurrence of the pain. Risks associated with anesthesia. Blood transfusion  and blood loss. Discussed the recovery time with her and she does give her  informed consent and wishes to proceed.                                               Dineen Kid Rana Snare, M.D.    DCL/MEDQ  D:  05/16/2003  T:  05/16/2003  Job:  [id number]

## 2011-03-23 NOTE — Op Note (Signed)
NAME:  [name], [name]                           ACCOUNT NO.:  [id number]   MEDICAL RECORD NO.:  [id number]                   PATIENT TYPE:  AMB   LOCATION:  SDC                                  FACILITY:  [facility]   PHYSICIAN:  Dineen Kid. Rana Snare, M.D.                 DATE OF BIRTH:  [birth year]   DATE OF PROCEDURE:  05/16/2003  DATE OF DISCHARGE:                                 OPERATIVE REPORT   PREOPERATIVE DIAGNOSES:  1. Pelvic pain.  2. Dyspareunia.  3. History of adhesions.  4. History of endometriosis.  5. History of right hydrosalpinx on ultrasound, not responsive to     conservative medical management.   POSTOPERATIVE DIAGNOSES:  1. Pelvic pain.  2. Dyspareunia.  3. History of adhesions.  4. History of endometriosis.  5. History of right hydrosalpinx on ultrasound, not responsive to     conservative medical management.  6. Extensive pelvic adhesions.   PROCEDURES:  1. Laparoscopy with extensive lysis of adhesions.  2. Right salpingectomy.   SURGEON:  Dineen Kid. Rana Snare, M.D.   ANESTHESIA:  General endotracheal.   INDICATIONS FOR PROCEDURE:  [name] is a 40 year old G7, P3, A4, with  persistent pelvic pain, bilateral quadrants.  She gives a history of  adhesions and endometriosis and dyspareunia.  An ultrasound showed a right  hydrosalpinx.  This has not responded to conservative medical management and  the patient desires more definitive surgical evaluation and treatment.  Planned laparoscopy and possible removal of one ovary, definite removal of  the right hydrosalpinx, possible lysis of adhesions, ablation of  endometriosis.  Risks and benefits were discussed at length.  Informed  consent was obtained.  See history and physical for further details.   FINDINGS AT TIME OF SURGERY:  Extensive pelvic adhesions to the bowel and  omentum to the pelvis close to the vaginal line to bilateral ovaries and a  right hydrosalpinx.  Normal appearing liver.  After lysis of adhesions  was  carried out, the left and right ovaries were normal in appearance.  No  endometriosis was encountered.   DESCRIPTION OF PROCEDURE:  After adequate analgesia, the patient placed in  the dorsal lithotomy position.  She was sterilely prepped and draped.  The  bladder was sterilely drained.  A sponge stick was placed in the vaginal  cuff.  A 1-cm infraumbilical skin incision was made.  The Veress needle was  inserted.  The abdomen was insufflated to dullness of percussion.  A 5-mm  trocar was inserted into the left of the midline two fingerbreadths below  the pubic symphysis and the above findings were noted.  Lysis of adhesions  was carried out with sharp Endoshears and bipolar cautery used for  hemostasis.  This was carried out throughout the entire pelvis with care  being taken to avoid underlying vessels and ureters also to achieve good  hemostasis while avoiding  injury to the bowel.  After extensive lysis of  adhesions, the right hydrosalpinx was identified.  The right fallopian tube  was elevated and cauterization was carried out across the mesosalpinx.  Endoshears were used to remove the mesosalpinx and the right fallopian tube.  This was removed through the trocar.  Good hemostasis was achieved.  At this  point the right ovary was pretty mobile, without any adhesions or evidence  of endometriosis.  The left fallopian tube had previously been removed.  The  left ovary was adherent to the pelvic side wall and bowel and omentum.  These were sharply removed.  Hemostasis was achieved and after the  procedure, the left ovary was free and mobile.  The patient and cul-de-sac  had no residual evidence of endometriosis or pelvic adhesions.  Interceed  adhesion barrier was placed across the pelvic floor.  After copious amount  of irrigation, hemostasis was assured.  The abdomen desufflated.  Trocars  removed.  The infraumbilical skin incision was closed with a 0 Vicryl in  figure-of-eight  fashion and 3-0 Vicryl Rapide subcuticular stitch, and also  two interrupted sutures of 3-0 Vicryl Rapide.  The 5-mm site was closed with  a horizontal mattress of 3-0 Vicryl Rapide.  The incisions were injected  with 0.25% Marcaine 10 mL.  The sponge stick was removed from the vagina.  The patient tolerated the procedure well and was stable on transfer to the  recovery room.  Sponge and instrument count was normal x3 and estimated  blood loss was 10 mL.   DISPOSITION:  The patient to be discharged to home.  Follow up in the office  in two to three weeks.  Sent home with a routine instruction sheet for  laparoscopy and a prescription for Vicodin #30.  Told her to return for  increased pain, fever, or breathing.                                                 Dineen Kid Rana Snare, M.D.    DCL/MEDQ  D:  05/17/2003  T:  05/17/2003  Job:  [id number]

## 2011-03-23 NOTE — Discharge Summary (Signed)
[facility]  Patient:    [name], [name]                        MRN: [id number] Adm. Date:  [id number] Disc. Date: [id number] Attending:  Mickle Mallory Dictator:   Leilani Able, P.A.                           Discharge Summary  FINAL DIAGNOSES:              1. 36 to 37-week intrauterine pregnancy.                               2. Positive maturity studies.                               3. Previous cesarean section x 2.                               4. Extreme maternal discomfort.                               5. Preterm contractions.                               6. Positive Group B Strep culture.  PROCEDURE:                    Repeat low transverse cesarean section.  SURGEON:                      Gerrit Friends. Aldona Bar, M.D.  COMPLICATIONS:                None.  HISTORY OF PRESENT ILLNESS:   This 40 year old, gravida 4, para 2, has had two previous cesarean sections with her last two pregnancies.  The patient has had extreme maternal discomfort during this entire pregnancy and has had frequent contractions requiring hospitalization, tocolytics, and medications.  An amniocentesis was carried out on April 17 and showed that the lung maturity was  positive.  The patient was then scheduled for a cesarean section.  HOSPITAL COURSE:              She was taken to the operating room on February 22, 2000, by Gerrit Friends. Aldona Bar, M.D. where a repeat low transverse cesarean section was performed with the delivery of a 6 pound 9 ounce female infant with Apgars of 8 and 9. Delivery went without complications.  The patients postoperative course was benign without significant fevers.  She was felt ready for discharge on postoperative day #2.  She was sent home on a regular diet, told to decrease activities, told to continue prenatal vitamins.  She was given Tylox one to two  every four hours as needed for pain, and told to return to the office on Monday for staple  removal.  DISCHARGE LABORATORY DATA:    The patient had a hemoglobin of 9.6, white blood ell count 16.4. DD:  03/10/00 TD:  03/11/00 Job: [id number]

## 2011-03-23 NOTE — Op Note (Signed)
[facility]  Patient:    [name], [name]                        MRN: [id number] Proc. Date: 04/26/01 Adm. Date:  [id number] Attending:  Donne Hazel                           Operative Report  PREOPERATIVE DIAGNOSIS:       Ruptured tubal pregnancy.  POSTOPERATIVE DIAGNOSIS:      Ruptured left tubal pregnancy.  PROCEDURE:                    Left partial salpingectomy.  SURGEON:                      Willey Blade, M.D.  ANESTHESIA:                   General endotracheal.  ESTIMATED BLOOD LOSS:         100 cc.  COMPLICATIONS:                None.  FINDINGS:                     At time of surgery, a ruptured tubal pregnancy was identified of the distal one-half of the left fallopian tube. A left partial salpingectomy was performed. I also drained a small left corpus luteum cyst on the left ovary. The uterus was mildly enlarged but otherwise normal. The right adnexa appeared to be normal. There was a hemoperitoneum of about 100 cc noted.  DESCRIPTION OF PROCEDURE:     The patient was taken to the operating room where a general endotracheal anesthetic was administered. The patient was placed on the operating table in the dorsal lithotomy position, the abdomen, perineum, and vagina was prepped and draped in the usual sterile fashion with Betadine and sterile drapes. A Foley catheter was inserted. A Hulka tenaculum was placed inside the intrauterine cavity. Next, a small vertical infraumbilical skin incision was made through which a Veress needle was inserted atraumatically into the abdominal cavity. The abdomen was then insufflated with carbon dioxide gas until a pneumoperitoneum creating 15 mmHg of pressure was created. The Veress needle was removed and a disposable laparoscopic trocar was inserted atraumatically into the abdominal cavity. An accessory incision was made two fingerbreadths above the pubic symphysis and in the midline. A 5 mm  laparoscopic trocar was placed through this with a 5 mm trocar under direct, continuous laparoscopic guidance. The tubal pregnancy was identified and grasped and elevated into the anterior portion of the abdomen. The mesosalpinx was then cauterized thoroughly with a bipolar cautery unit. The distal one path of the tube was then removed after cauterizing the mesosalpinx thoroughly and then dissecting the tube off of the cauterized area with the laparoscopic scissors. An Endocatch was then placed after a 10 mm trocar replaced the 5 mm trocar. An Endocatch was then placed and the ectopic pregnancy removed through this.  The pelvis was then thoroughly irrigated with copious amounts of irrigant and most of the blood was tried to be removed through the suction device. This was done with a Nezhat irrigator. Good hemostasis was once again noted. Attention was then turned to closure. All abdominal instruments were removed and the gas allowed to escape from the abdomen. The two small incisions were closed first  with two interrupted sutures on the muscle fascia.  The skin was then reapproximated with multiple interrupted sutures of 3-0 Vicryl Rapide. All vaginal instruments were then removed. The patient was then awakened, extubated and taken to the recovery room in good condition. There were no perioperative complications. DD:  04/26/01 TD:  04/28/01 Job: [id number]

## 2011-03-23 NOTE — Op Note (Signed)
[facility]  Patient:    [name], [name] Visit Number: [id number] MRN: [id number]          Service Type: DSU Location: [facility] Attending Physician:  Trevor Iha Dictated by:   Trevor Iha, M.D. Proc. Date: 07/17/01                             Operative Report  PREOPERATIVE DIAGNOSES:       1. Probable right ectopic pregnancy.                               2. Pelvic pain.  POSTOPERATIVE DIAGNOSES:      1. Probable right ectopic pregnancy.                               2. Pelvic pain.                               3. Adhesive disease.  PROCEDURE:                    Laparoscopy with lysis of adhesions and right                               linear salpingostomy with removal of ectopic                               pregnancy.  SURGEON:                      Trevor Iha, M.D.  ANESTHESIA:                   General endotracheal.  ESTIMATED BLOOD LOSS:         Less than 25 cc.  INDICATIONS:                  [name] is a 40 year old, G6, P3, with a history of ectopic in June and a left salpingo-oophorectomy who presents with a pregnancy test, beta hCG greater than 9000, ultrasound showing a right ectopic pregnancy measuring 5 weeks 3 days size. She desires definitive surgical intervention, desires preservation the fallopian tube. Risks and benefits were discussed at length. Informed consent was obtained. See history and physical for further details.  FINDINGS AT TIME OF SURGERY:  Adhesions from the bowel and the omentum to the anterior surface of the uterus. Right fallopian tube dilated at the ampullary portion consistent with a right ampullary ectopic.  DESCRIPTION OF PROCEDURE:     After adequate analgesia, the patient was placed in the dorsal lithotomy position. She was sterilely prepped and draped. Bladder was sterilely drained. A Hulka tenaculum was placed on the anterior lip of the cervix. A 1 cm infraumbilical skin  incision was made. A Veress needle was inserted. The abdomen was insufflated to dullness percussion. An 11 mm trocar was inserted and the above findings were noted. A 5 mm trocar was inserted to the left of the midline two fingerbreadths above the pubic symphysis under direct visualization and the trocar was inserted. Adhesions on the anterior surface of the cervix were sharply dissected with  Endoshear scissors with good hemostasis achieved with bipolar cautery. The right fallopian tube was grasped with the atraumatic graspers. Pitressin was injected along the ampullary portion of the antimesenteric border. Linear salpingostomy was performed with Endoshear scissors along the antimesenteric border over the ampulla. The ectopic tissue was grasped from within the ampulla and was removed from the abdomen and sent to pathology. After all the tissue and clot that could be palpated or visualized, was removed from that portion of the tube. Irrigation was performed with suction irrigator, and after no further tissue was identified at this point, hemostasis was then achieved along the border of the _______ salpingostomy with bipolar cautery. Examination showed good hemostasis and no further dilation of the ampullary portion of the tube. At this time, the trocars were removed. The abdomen was desufflated for examination of the tube at this time which still revealed good hemostasis. After the trocar was removed, the infraumbilical skin incision was closed with a O Vicryl UR needle figure-of-eight in fashion. A 3-0 Vicryl Rapide subcuticular stitch in the skin. The 5 mm site was closed with 3-0 Vicryl Rapide subcuticular stitch. The incisions were injected with 0.25% Marcaine. The tenaculum was removed from the cervix, and the cervix was noted to be hemostatic. The patient tolerated the procedure well, was stable, and transferred to recovery room. Sponge, needle, and instrument count was normal x 3.  Estimated blood loss was less than 25 cc.  DISPOSITION:                  The patient will be discharged home to follow up in the office in one week for a repeat beta hCG and incision check. She was sent home with a routine instruction sheet for laparoscopy and a prescription for Darvocet, #30. She was told to return for increased fever, bleeding, pain. We will have her followup with weekly beta hCGs until zero, and will give her Depo-Provera 150 mg IM before she leaves the hospital. Dictated by:   Trevor Iha, M.D. Attending Physician:  Trevor Iha DD:  07/17/01 TD:  07/17/01 Job: [id number]

## 2011-03-23 NOTE — H&P (Signed)
[facility]  Patient:    [name], [name]                        MRN: [id number] Adm. Date:  [id number] Attending:  Donne Hazel                         History and Physical  HISTORY OF PRESENT ILLNESS:   [name] is a 40 year old female, gravida 5, para 3 with known ectopic pregnancy. The patient received methotrexate on April 24, 2001. Today she presents at the emergency room with severe abdominal discomfort. She was admitted to the emergency room and was writhing in pain upon admission. Informed consent was given and she was taken immediately to the operating room for surgical repair of ruptured tubal pregnancy.  PAST MEDICAL HISTORY:         1. History of cholecystectomy.                               2. History of recent methotrexate injection.  PAST SURGICAL HISTORY:        1. Cesarean section x 3.                               2. Cholecystectomy.  OBSTETRIC HISTORY:            1. Cesarean section x 3 at term.                               2. SAB x 1.  CURRENT MEDICATIONS:          Methotrexate.  ALLERGIES:                    CODEINE and PENICILLIN.  PHYSICAL EXAMINATION:  VITAL SIGNS:                  Vital signs stable, temperature deferred, pulse 108, respirations 20, blood pressure 120/72.  GENERAL:                      Well-developed, well-nourished female writhing in pain.  HEENT:                        Within normal limits with no pallor noted.  NECK:                         Supple.  HEART AND LUNGS:              Clear, tachycardia is noted.  BREASTS:                      Deferred.  ABDOMEN:                      Rigid with extreme tenderness and peritoneal signs. No masses identifiable due to the patients discomfort.  PELVIC:                       Deferred.  EXTREMITIES AND NEUROLOGIC:   Grossly normal.  LABORATORY DATA:              Maternal blood type is B positive (  unsure), hemoglobin 12.2.  ASSESSMENT:                    Ruptured tubal pregnancy.  PLAN:                         1. Laparoscopy.                               2. Possible laparotomy.                               3. Repair of ruptured tubal pregnancy.  DISCUSSION:                   The risk and benefits of this procedure discussed briefly with the patient. I explained laparoscopy and the goals of therapy with her briefly. Her questions were answered regarding this procedure. DD:  04/26/01 TD:  04/27/01 Job: [id number]

## 2011-03-23 NOTE — H&P (Signed)
[facility]  Patient:    [name], [name] Visit Number: [id number] MRN: [id number]          Service Type: DSU Location: [facility] Attending Physician:  Trevor Iha Dictated by:   Trevor Iha, M.D. Adm. Date:  07/17/01                           History and Physical  HISTORY OF PRESENT ILLNESS:   [name] is a 40 year old G6, P3, A2, received in the office, newly pregnant.  Beta HCG was 9000.  Ultrasound shows probable right adnexal ectopic measuring 1.1 cm in size.  She has a history of a previous ectopic on the left three months ago which required a left salpingectomy.  She presents today with increasing cramping and bleeding. Ultrasound today at [facility] confirmed the ectopic pregnancy of the right adnexa measuring 5 weeks 3 days with a yolk sac, a pseudodecidual reaction in the uterus but no intrauterine pregnancy.  She presents for laparoscopic evaluation and hopeful preservation of the right fallopian tube. Last menstrual period was May 13, 2001.  Beta HCG yesterday was greater than 9000.  PAST MEDICAL HISTORY:         Negative.  PAST SURGICAL HISTORY:        Cholecystectomy, three cesarean sections, and ruptured ectopic in June with a left salpingectomy.  MEDICATIONS:                  None.  ALLERGIES:                    PENICILLIN and CODEINE.  PHYSICAL EXAMINATION:  VITAL SIGNS:                  Blood pressure 106/62.  HEART:                        Regular rate and rhythm.  LUNGS:                        Clear to auscultation bilaterally.  ABDOMEN:                      Nondistended, nontender.  PELVIC:                       Uterus is anteverted, mobile, mildly tender in the right adnexa.  Mild vaginal bleeding.  LABORATORY DATA:              Hemoglobin 12.9, platelets 348.  IMPRESSION AND PLAN:          Right ectopic pregnancy by ultrasound and beta human chorionic gonadotropin (HCG).  The patient desires  preservation of the right fallopian tube if possible.  I discussed at length the risks and benefits.  She is also offered methotrexate therapy.  She is aware that she is at extremely high risk for recurrent ectopic pregnancy if a salpingostomy is performed.  She is also aware she needs to follow up weekly beta HCGs if we attempt salpingostomy, but she does wish to try to preserve it at all costs.  Risks and benefits of the procedure including but not limited risks of infection, bleeding, damage to uterus, tubes, ovaries, bowel, bladder were discussed.  Also associated risk of blood transfusion including risk of AIDS or hepatitis.  The patient does give informed consent.  Dictated by:   Trevor Iha, M.D. Attending Physician:  Trevor Iha DD:  07/17/01 TD:  07/17/01 Job: [id number]

## 2011-03-23 NOTE — H&P (Signed)
[name], [name]                           ACCOUNT NO.:  [id number]   MEDICAL RECORD NO.:  [id number]                   PATIENT TYPE:  AMB   LOCATION:  SDC                                  FACILITY:  [facility]   PHYSICIAN:  Dineen Kid. Rana Snare, M.D.                 DATE OF BIRTH:  January 08, 1971   DATE OF ADMISSION:  06/09/2002  DATE OF DISCHARGE:                                HISTORY & PHYSICAL   HISTORY OF PRESENT ILLNESS:  The patient is a 40 year old G7, P3, with  history of several ectopics, history of a left salpingo-oophorectomy,  history of multiple ectopics and most recently with abnormal uterine  bleeding and pelvic pain.  She has the pain and bleeding has been  unresponsive to conservative medical management which has included anti-  inflammatory medications, narcotic medications, also Depo-Provera and  estrogen.  The patient desires definitive surgical evaluation of treatment.  She also has a history of uterine fibroids, has no future childbearing  desires and wishes hysterectomy, presents for that.   PAST MEDICAL HISTORY:  Negative.   PAST SURGICAL HISTORY:  Three cesarean sections, cholecystectomy, several  laparoscopies for ectopics and pelvic adhesions and pelvic pain.   MEDICATIONS:  Depo-Provera, Osteo and Ovcon-35 at two a day and Vicodin.   ALLERGIES:  She is allergic to CODEINE and PENICILLIN but can tolerate  hydrocodone and oxycodone.   PHYSICAL EXAMINATION:  VITAL SIGNS: Her blood pressure is 118/76; hemoglobin  15.4.  HEART: Regular rate and rhythm.  LUNGS: Clear to auscultation bilaterally.  ABDOMEN: Nondistended, nontender.  She has several well-healed incisions at  the umbilicus from previous laparoscopies.  She does have an incision just  above the umbilicus which is well healed and dimples.  She had approximately  3-4 mm which she would like revised.  PELVIC: Uterus is antevert and mobile, mildly tender, slightly enlarged by  ultrasound and pelvic exam no  adnexal masses are palpable.  Ultrasound shows  mildly enlarged uterus, at least one fibroid greater than 1 cm, normal-  appearing ovary.   IMPRESSION:  Abnormal uterine bleeding, pelvic pain, fibroids unresponsive  to conservative medical management.  The patient desires definitive surgical  evaluation and treatment.   PLAN:  Hysterectomy.  Risks and benefits were discussed at length which  include but are not limited to risk of infection, bleeding, damage to  ureters, bowel, bladder, possibility of not being able to resolve the pelvic  pain.  The patient also desires the previous incision to be revised.  The  patient also understands that this may or may not achieve the cosmetic  effects that she desires but we will attempt to improve it at the time of  surgery.  She does give an informed consent.  Dineen Kid Rana Snare, M.D.    DCL/MEDQ  D:  06/08/2002  T:  06/08/2002  Job:  [id number]

## 2011-04-03 ENCOUNTER — Encounter (HOSPITAL_COMMUNITY): Payer: Self-pay | Admitting: Physician Assistant

## 2011-04-19 ENCOUNTER — Encounter (HOSPITAL_COMMUNITY): Payer: 59 | Admitting: Physician Assistant

## 2011-05-04 ENCOUNTER — Encounter: Payer: Self-pay | Admitting: Gastroenterology

## 2011-05-10 ENCOUNTER — Encounter: Payer: Self-pay | Admitting: Gastroenterology

## 2011-05-14 ENCOUNTER — Emergency Department (HOSPITAL_COMMUNITY): Admission: EM | Admit: 2011-05-14 | Payer: 59 | Source: Home / Self Care

## 2011-07-06 ENCOUNTER — Other Ambulatory Visit: Payer: 59 | Admitting: Gastroenterology

## 2011-08-10 ENCOUNTER — Telehealth: Payer: Self-pay

## 2011-08-10 NOTE — Telephone Encounter (Signed)
Was called into work @3  am

## 2011-08-24 ENCOUNTER — Other Ambulatory Visit: Payer: 59 | Admitting: Gastroenterology

## 2012-01-18 ENCOUNTER — Ambulatory Visit (INDEPENDENT_AMBULATORY_CARE_PROVIDER_SITE_OTHER): Payer: 59 | Admitting: Family Medicine

## 2012-01-18 ENCOUNTER — Encounter: Payer: Self-pay | Admitting: Family Medicine

## 2012-01-18 VITALS — Ht 60.0 in | Wt 174.2 lb

## 2012-01-18 DIAGNOSIS — E669 Obesity, unspecified: Secondary | ICD-10-CM | POA: Insufficient documentation

## 2012-01-18 NOTE — Progress Notes (Signed)
Medical Nutrition Therapy:  Appt start time: 1200 end time:  1300.  Assessment:  Primary concerns today: Weight management.   Usual eating pattern includes 2-3 meals and 2-3 snacks per day. [name] is trying to limit kcal to 1200 per day, using MyFitnessPal.  She is participating with the [employer] as part of the [facility] ER team.  [place] gave up sodas two weeks ago.  She is also very concerned with her 11-YO son, whose PCP has referred for MNT related to obesity.   Everyday foods include water, Naked Juice (2-3 X wk), 6 oz yogurt, salad daily.  Avoided foods include seafood, [place] food, Brussels sprouts, broccoli, spinach.    24-hr recall suggests an intake of 09-1199 kcal:  B (5:30 AM)-   1 banana, Swt & Salty bkfst bar, 1 c o.j (total 440 kcal) Snk ( AM)-   none L (12:30 PM)-  Salad, Smart Ones frzn meal [place] & potatoes (total kcal 340), water Snk ( PM)-  none D (5:30 PM)-  1/2 Monterey's taco salad (let, car, radishes, 1 tsp salad dressing) (total 327 kcal), water Snk ( PM)-  2 Ritz crackers (~20 kcal)  Usual physical activity includes none, but plans to join the gym, and go with her son.  Fiorella was very active (up to 4 hrs of exercise/day) several years ago.    Progress Towards Goal(s):  In progress.   Nutritional Diagnosis:  NB-2.1 Physical inactivity As related to time constraints and motivation.  As evidenced by no current regular activity.    Intervention:  Nutrition education.  Monitoring/Evaluation:  Dietary intake, exercise, and body weight in 1 month.

## 2012-01-18 NOTE — Patient Instructions (Signed)
-   Exercise goals:  1. Join [employer].    2. Weekly exercise:  At least 60 min 3 X wk to start.  Build up to at least 5 X wk.   3. Look for exercise opportunities in your day:  Never lie down when you can sit; never sit when you can stand; never stand when you can pace.  Moving your body throughout the day is just as important as the 30 or 60 minutes of    exercise at the gym! - Food goals:  1. Eat at least 3 meals and 1-2 snacks per day.  Aim for no more than 5 hours between eating.    Eat breakfast within the first hour of getting up.  If you eat cereal, choose one with at least 5 grams of fiber per serving, and have a lot of milk (for protein).    2. Obtain twice as many veg's &/or fruits as protein or carbohydrate foods for both lunch and dinner.  3. Include protein at each meal, i.e., meat, fish, poultry, beans, peanut butter, dairy foods, eggs, and soy foods.    4. Fat-free milk is best (0 grams of fat vs. 5 grams/cup in 2%).  5. Limit sugar-sweetened beverages.    - TASTE PREFERENCES ARE LEARNED.  This means that it will get easier to choose foods you know are good for you if you are exposed to them enough.   - Keep in mind:  There are EVERYDAY foods and SOMETIMES foods.    - Call [employer] to ask re. coverage for TJ for Medical Nutrition Therapy.  Call HR to ask if he is eligible for no co-pay.

## 2012-03-11 ENCOUNTER — Ambulatory Visit: Payer: 59 | Admitting: Family Medicine

## 2012-08-01 ENCOUNTER — Encounter: Payer: Self-pay | Admitting: Gastroenterology

## 2013-02-03 ENCOUNTER — Other Ambulatory Visit (HOSPITAL_COMMUNITY): Payer: Self-pay | Admitting: Nurse Practitioner

## 2013-02-03 DIAGNOSIS — Z1231 Encounter for screening mammogram for malignant neoplasm of breast: Secondary | ICD-10-CM

## 2013-02-25 ENCOUNTER — Ambulatory Visit (HOSPITAL_COMMUNITY)
Admission: RE | Admit: 2013-02-25 | Discharge: 2013-02-25 | Disposition: A | Discharge status: Home / Self Care | Payer: 59 | Source: Ambulatory Visit | Attending: Nurse Practitioner | Admitting: Nurse Practitioner

## 2013-02-25 DIAGNOSIS — Z1231 Encounter for screening mammogram for malignant neoplasm of breast: Secondary | ICD-10-CM | POA: Insufficient documentation

## 2013-03-05 ENCOUNTER — Encounter: Payer: Self-pay | Admitting: Internal Medicine

## 2013-03-05 ENCOUNTER — Ambulatory Visit (INDEPENDENT_AMBULATORY_CARE_PROVIDER_SITE_OTHER): Payer: 59 | Admitting: Internal Medicine

## 2013-03-05 VITALS — BP 108/70 | HR 95 | Temp 97.7°F | Resp 18 | Ht 61.75 in | Wt 191.0 lb

## 2013-03-05 DIAGNOSIS — Z0000 Encounter for general adult medical examination without abnormal findings: Secondary | ICD-10-CM

## 2013-03-05 DIAGNOSIS — R945 Abnormal results of liver function studies: Secondary | ICD-10-CM

## 2013-03-05 DIAGNOSIS — F909 Attention-deficit hyperactivity disorder, unspecified type: Secondary | ICD-10-CM

## 2013-03-05 DIAGNOSIS — E669 Obesity, unspecified: Secondary | ICD-10-CM

## 2013-03-05 LAB — CBC WITH DIFFERENTIAL/PLATELET
Eosinophils Absolute: 0.3 10*3/uL (ref 0.0–0.7)
Eosinophils Relative: 3 % (ref 0.0–5.0)
Hemoglobin: 13.8 g/dL (ref 12.0–15.0)
Lymphocytes Relative: 23.8 % (ref 12.0–46.0)
Monocytes Relative: 6.2 % (ref 3.0–12.0)
Neutrophils Relative %: 66.8 % (ref 43.0–77.0)
Platelets: 357 10*3/uL (ref 150.0–400.0)
RDW: 14.5 % (ref 11.5–14.6)

## 2013-03-05 LAB — COMPREHENSIVE METABOLIC PANEL
ALT: 50 U/L — ABNORMAL HIGH (ref 0–35)
AST: 25 U/L (ref 0–37)
Albumin: 3.8 g/dL (ref 3.5–5.2)
Alkaline Phosphatase: 61 U/L (ref 39–117)
Calcium: 8.7 mg/dL (ref 8.4–10.5)
Chloride: 102 mEq/L (ref 96–112)
Potassium: 4 mEq/L (ref 3.5–5.1)
Sodium: 134 mEq/L — ABNORMAL LOW (ref 135–145)
Total Protein: 6.7 g/dL (ref 6.0–8.3)

## 2013-03-05 LAB — TSH: TSH: 0.4 u[IU]/mL (ref 0.35–5.50)

## 2013-03-05 MED ORDER — PHENTERMINE HCL 15 MG PO CAPS: 15.0000 mg | ORAL_CAPSULE | ORAL | Status: DC

## 2013-03-05 MED ORDER — PHENTERMINE HCL 37.5 MG PO CAPS: 37.5000 mg | ORAL_CAPSULE | ORAL | Status: DC

## 2013-03-05 NOTE — Patient Instructions (Addendum)
It is important that you exercise regularly, at least 20 minutes 3 to 4 times per week.  If you develop chest pain or shortness of breath seek  medical attention.  You need to lose weight.  Consider a lower calorie diet and regular exercise.DASH Diet The DASH diet stands for "Dietary Approaches to Stop Hypertension." It is a healthy eating plan that has been shown to reduce high blood pressure (hypertension) in as little as 14 days, while also possibly providing other significant health benefits. These other health benefits include reducing the risk of breast cancer after menopause and reducing the risk of type 2 diabetes, heart disease, colon cancer, and stroke. Health benefits also include weight loss and slowing kidney failure in patients with chronic kidney disease.  DIET GUIDELINES  Limit salt (sodium). Your diet should contain less than 1500 mg of sodium daily.  Limit refined or processed carbohydrates. Your diet should include mostly whole grains. Desserts and added sugars should be used sparingly.  Include small amounts of heart-healthy fats. These types of fats include nuts, oils, and tub margarine. Limit saturated and trans fats. These fats have been shown to be harmful in the body. CHOOSING FOODS  The following food groups are based on a 2000 calorie diet. See your Registered Dietitian for individual calorie needs. Grains and Grain Products (6 to 8 servings daily)  Eat More Often: Whole-wheat bread, brown rice, whole-grain or wheat pasta, quinoa, popcorn without added fat or salt (air popped).  Eat Less Often: White bread, white pasta, white rice, cornbread. Vegetables (4 to 5 servings daily)  Eat More Often: Fresh, frozen, and canned vegetables. Vegetables may be raw, steamed, roasted, or grilled with a minimal amount of fat.  Eat Less Often/Avoid: Creamed or fried vegetables. Vegetables in a cheese sauce. Fruit (4 to 5 servings daily)  Eat More Often: All fresh, canned (in  natural juice), or frozen fruits. Dried fruits without added sugar. One hundred percent fruit juice ( cup [237 mL] daily).  Eat Less Often: Dried fruits with added sugar. Canned fruit in light or heavy syrup. [employer], Fish, and Poultry (2 servings or less daily. One serving is 3 to 4 oz [85-114 g]).  Eat More Often: Ninety percent or leaner ground beef, tenderloin, sirloin. Round cuts of beef, chicken breast, [place] breast. All fish. Grill, bake, or broil your meat. Nothing should be fried.  Eat Less Often/Avoid: Fatty cuts of meat, [place], or chicken leg, thigh, or wing. Fried cuts of meat or fish. Dairy (2 to 3 servings)  Eat More Often: Low-fat or fat-free milk, low-fat plain or light yogurt, reduced-fat or part-skim cheese.  Eat Less Often/Avoid: Milk (whole, 2%).Whole milk yogurt. Full-fat cheeses. Nuts, Seeds, and Legumes (4 to 5 servings per week)  Eat More Often: All without added salt.  Eat Less Often/Avoid: Salted nuts and seeds, canned beans with added salt. Fats and Sweets (limited)  Eat More Often: Vegetable oils, tub margarines without trans fats, sugar-free gelatin. Mayonnaise and salad dressings.  Eat Less Often/Avoid: Coconut oils, palm oils, butter, stick margarine, cream, half and half, cookies, candy, pie. FOR MORE INFORMATION The Dash Diet Eating Plan: www.dashdiet.org Document Released: 10/11/2011 Document Revised: 01/14/2012 Document Reviewed: 10/11/2011 [facility] Patient Information 2013 [place], [place].

## 2013-03-05 NOTE — Progress Notes (Signed)
Subjective:    Patient ID: [name], female    DOB: [birth year], 42 y.o.   MRN: [id number]  HPI  42 year old patient who is seen today to reestablish.  She has history of ADHD which has been stable off medication. Her main concern today is weight gain.  She also works 60 hour work weeks and also is going to school.  She has used phentermine in the past with some success. She has joined a Psychologist, forensic and is planning on more regular exercise.  Family history father died age 39 of complications of colon cancer Mother died age 29 of complications of pneumonia 4 sisters one brother. Positive for colon cancer colon polyps One sister with hypothyroidism  Gravida 7 para 3 abortus 4  Social history- divorced employed the [employer] history status post hysterectomy and cholecystectomy remote tonsillectomy  History reviewed. No pertinent past medical history.  History   Social History  . Marital Status: Divorced    Spouse Name: N/A    Number of Children: N/A  . Years of Education: N/A   Occupational History  . Not on file.   Social History Main Topics  . Smoking status: Never Smoker   . Smokeless tobacco: Never Used  . Alcohol Use: Not on file  . Drug Use: Not on file  . Sexually Active: Not on file   Other Topics Concern  . Not on file   Social History Narrative  . No narrative on file    History reviewed. No pertinent past surgical history.  No family history on file.  Allergies  Allergen Reactions  . Penicillins     No current outpatient prescriptions on file prior to visit.   No current facility-administered medications on file prior to visit.    BP 108/70  Pulse 95  Temp(Src) 97.7 F (36.5 C) (Oral)  Resp 18  Ht 5' 1.75" (1.568 m)  Wt 191 lb (86.637 kg)  BMI 35.24 kg/m2  SpO2 98%       Wt Readings from Last 3 Encounters:  03/05/13 191 lb (86.637 kg)  01/18/12 174 lb 3.2 oz (79.017 kg)  04/20/08 156 lb (70.761 kg)    Review of Systems   Constitutional: Positive for unexpected weight change.  HENT: Negative for hearing loss, congestion, sore throat, rhinorrhea, dental problem, sinus pressure and tinnitus.   Eyes: Negative for pain, discharge and visual disturbance.  Respiratory: Negative for cough and shortness of breath.   Cardiovascular: Negative for chest pain, palpitations and leg swelling.  Gastrointestinal: Negative for nausea, vomiting, abdominal pain, diarrhea, constipation, blood in stool and abdominal distention.  Genitourinary: Negative for dysuria, urgency, frequency, hematuria, flank pain, vaginal bleeding, vaginal discharge, difficulty urinating, vaginal pain and pelvic pain.  Musculoskeletal: Negative for joint swelling, arthralgias and gait problem.  Skin: Negative for rash.  Neurological: Negative for dizziness, syncope, speech difficulty, weakness, numbness and headaches.  Hematological: Negative for adenopathy.  Psychiatric/Behavioral: Negative for behavioral problems, dysphoric mood and agitation. The patient is not nervous/anxious.        Objective:   Physical Exam  Constitutional: She is oriented to person, place, and time. She appears well-developed and well-nourished.  Weight 191  HENT:  Head: Normocephalic.  Right Ear: External ear normal.  Left Ear: External ear normal.  Mouth/Throat: Oropharynx is clear and moist.  Eyes: Conjunctivae and EOM are normal. Pupils are equal, round, and reactive to light.  Neck: Normal range of motion. Neck supple. No thyromegaly present.  Cardiovascular: Normal rate,  regular rhythm, normal heart sounds and intact distal pulses.   Pulmonary/Chest: Effort normal and breath sounds normal.  Abdominal: Soft. Bowel sounds are normal. She exhibits no mass. There is no tenderness.  Musculoskeletal: Normal range of motion.  Lymphadenopathy:    She has no cervical adenopathy.  Neurological: She is alert and oriented to person, place, and time.  Skin: Skin is warm and  dry. No rash noted.  Psychiatric: She has a normal mood and affect. Her behavior is normal.          Assessment & Plan:   Preventive health exam Exogenous obesity History of ADHD History of asthma stable History elevated LFTs  We'll check some updated lab We'll refill phentermine which has been helpful for weight loss in the past. Better diet weight loss exercise regimen all discussed Recheck 6 months

## 2013-06-03 ENCOUNTER — Encounter: Payer: Self-pay | Admitting: Internal Medicine

## 2013-06-03 ENCOUNTER — Ambulatory Visit (INDEPENDENT_AMBULATORY_CARE_PROVIDER_SITE_OTHER): Payer: 59 | Admitting: Internal Medicine

## 2013-06-03 VITALS — BP 120/80 | HR 117 | Temp 98.5°F | Resp 20 | Wt 168.0 lb

## 2013-06-03 DIAGNOSIS — E669 Obesity, unspecified: Secondary | ICD-10-CM

## 2013-06-03 DIAGNOSIS — Z23 Encounter for immunization: Secondary | ICD-10-CM

## 2013-06-03 DIAGNOSIS — J45909 Unspecified asthma, uncomplicated: Secondary | ICD-10-CM

## 2013-06-03 MED ORDER — PHENTERMINE HCL 37.5 MG PO CAPS: 37.5000 mg | ORAL_CAPSULE | ORAL | Status: DC

## 2013-06-03 NOTE — Patient Instructions (Signed)
It is important that you exercise regularly, at least 20 minutes 3 to 4 times per week.  If you develop chest pain or shortness of breath seek  medical attention.  You need to lose weight.  Consider a lower calorie diet and regular exercise.  Return in one year for follow-up   

## 2013-06-03 NOTE — Progress Notes (Signed)
  Subjective:    Patient ID: [name], female    DOB: 10-12-1971, 42 y.o.   MRN: [id number]  HPI  42 year old patient who is seen today in followup. She has a history of exogenous obesity history musculoskeletal is on phentermine. She has had a very nice response in her weight is down from 191-168. She is exercising regularly. No concerns or complaints. She does have a history of mild stable asthma and uses albuterol rescue medication only rarely. She has been on maintenance medication in the past. No new concerns or complaints.  No past medical history on file.  History   Social History  . Marital Status: Divorced    Spouse Name: N/A    Number of Children: N/A  . Years of Education: N/A   Occupational History  . Not on file.   Social History Main Topics  . Smoking status: Never Smoker   . Smokeless tobacco: Never Used  . Alcohol Use: Not on file  . Drug Use: Not on file  . Sexually Active: Not on file   Other Topics Concern  . Not on file   Social History Narrative  . No narrative on file    No past surgical history on file.  No family history on file.  Allergies  Allergen Reactions  . Penicillins     Current Outpatient Prescriptions on File Prior to Visit  Medication Sig Dispense Refill  . phentermine 37.5 MG capsule Take 1 capsule (37.5 mg total) by mouth every morning.  90 capsule  2   No current facility-administered medications on file prior to visit.    BP 120/80  Pulse 117  Temp(Src) 98.5 F (36.9 C) (Oral)  Resp 20  Wt 168 lb (76.204 kg)  BMI 30.99 kg/m2  SpO2 97%       Review of Systems  Constitutional: Negative.   HENT: Negative for hearing loss, congestion, sore throat, rhinorrhea, dental problem, sinus pressure and tinnitus.   Eyes: Negative for pain, discharge and visual disturbance.  Respiratory: Negative for cough and shortness of breath.   Cardiovascular: Negative for chest pain, palpitations and leg swelling.   Gastrointestinal: Negative for nausea, vomiting, abdominal pain, diarrhea, constipation, blood in stool and abdominal distention.  Genitourinary: Negative for dysuria, urgency, frequency, hematuria, flank pain, vaginal bleeding, vaginal discharge, difficulty urinating, vaginal pain and pelvic pain.  Musculoskeletal: Negative for joint swelling, arthralgias and gait problem.  Skin: Negative for rash.  Neurological: Negative for dizziness, syncope, speech difficulty, weakness, numbness and headaches.  Hematological: Negative for adenopathy.  Psychiatric/Behavioral: Negative for behavioral problems, dysphoric mood and agitation. The patient is not nervous/anxious.        Objective:   Physical Exam  Constitutional: She appears well-nourished. No distress.  Blood pressure 120/80 Repeat pulse in the 80s          Assessment & Plan:   Exogenous obesity. Nice response with lifestyle changes and phentermine. The patient requests a refill. Stable asthma Recheck 1 year or as needed

## 2013-09-01 ENCOUNTER — Ambulatory Visit (INDEPENDENT_AMBULATORY_CARE_PROVIDER_SITE_OTHER): Payer: Self-pay | Admitting: Internal Medicine

## 2013-09-01 ENCOUNTER — Encounter: Payer: Self-pay | Admitting: Internal Medicine

## 2013-09-01 VITALS — BP 120/80 | HR 110 | Temp 98.4°F | Resp 20 | Wt 164.0 lb

## 2013-09-01 DIAGNOSIS — E669 Obesity, unspecified: Secondary | ICD-10-CM

## 2013-09-01 DIAGNOSIS — Z23 Encounter for immunization: Secondary | ICD-10-CM

## 2013-09-01 DIAGNOSIS — F909 Attention-deficit hyperactivity disorder, unspecified type: Secondary | ICD-10-CM

## 2013-09-01 MED ORDER — PHENTERMINE HCL 37.5 MG PO CAPS: 37.5000 mg | ORAL_CAPSULE | ORAL | Status: DC

## 2013-09-01 NOTE — Patient Instructions (Signed)
It is important that you exercise regularly, at least 20 minutes 3 to 4 times per week.  If you develop chest pain or shortness of breath seek  medical attention.  Return in 6 months for follow-up  

## 2013-09-01 NOTE — Progress Notes (Signed)
Subjective:    Patient ID: [name], female    DOB: Aug 18, 1971, 42 y.o.   MRN: [id number]  HPI  42 year old patient who has a history of ADHD as well as exogenous obesity. She has done well on phentermine for weight control but recently weights have stabilized. She is exercising regularly but do to academic and work demands not quite as regularly. She currently feels well.  History reviewed. No pertinent past medical history.  History   Social History  . Marital Status: Divorced    Spouse Name: N/A    Number of Children: N/A  . Years of Education: N/A   Occupational History  . Not on file.   Social History Main Topics  . Smoking status: Never Smoker   . Smokeless tobacco: Never Used  . Alcohol Use: Not on file  . Drug Use: Not on file  . Sexual Activity: Not on file   Other Topics Concern  . Not on file   Social History Narrative  . No narrative on file    History reviewed. No pertinent past surgical history.  No family history on file.  Allergies  Allergen Reactions  . Penicillins     No current outpatient prescriptions on file prior to visit.   No current facility-administered medications on file prior to visit.    BP 120/80  Pulse 110  Temp(Src) 98.4 F (36.9 C) (Oral)  Resp 20  Wt 164 lb (74.39 kg)  BMI 30.26 kg/m2  SpO2 98%       Review of Systems  Constitutional: Negative.   HENT: Negative for congestion, dental problem, hearing loss, rhinorrhea, sinus pressure, sore throat and tinnitus.   Eyes: Negative for pain, discharge and visual disturbance.  Respiratory: Negative for cough and shortness of breath.   Cardiovascular: Negative for chest pain, palpitations and leg swelling.  Gastrointestinal: Negative for nausea, vomiting, abdominal pain, diarrhea, constipation, blood in stool and abdominal distention.  Genitourinary: Negative for dysuria, urgency, frequency, hematuria, flank pain, vaginal bleeding, vaginal discharge, difficulty  urinating, vaginal pain and pelvic pain.  Musculoskeletal: Negative for arthralgias, gait problem and joint swelling.  Skin: Negative for rash.  Neurological: Negative for dizziness, syncope, speech difficulty, weakness, numbness and headaches.  Hematological: Negative for adenopathy.  Psychiatric/Behavioral: Negative for behavioral problems, dysphoric mood and agitation. The patient is not nervous/anxious.        Objective:   Physical Exam  Constitutional: She is oriented to person, place, and time. She appears well-developed and well-nourished.  HENT:  Head: Normocephalic.  Right Ear: External ear normal.  Left Ear: External ear normal.  Mouth/Throat: Oropharynx is clear and moist.  Eyes: Conjunctivae and EOM are normal. Pupils are equal, round, and reactive to light.  Neck: Normal range of motion. Neck supple. No thyromegaly present.  Cardiovascular: Normal rate, regular rhythm, normal heart sounds and intact distal pulses.   Pulmonary/Chest: Effort normal and breath sounds normal.  Abdominal: Soft. Bowel sounds are normal. She exhibits no mass. There is no tenderness.  Musculoskeletal: Normal range of motion.  Lymphadenopathy:    She has no cervical adenopathy.  Neurological: She is alert and oriented to person, place, and time.  Skin: Skin is warm and dry. No rash noted.  Psychiatric: She has a normal mood and affect. Her behavior is normal.          Assessment & Plan:   Obesity. Improved although a stabilized presently. We'll continue her efforts at diet and weight loss. We'll continue phentermine for least  3 months and if no further improvement we'll consider discontinuation ADHD stable

## 2014-02-25 ENCOUNTER — Ambulatory Visit: Payer: 59 | Admitting: Internal Medicine

## 2014-02-28 DIAGNOSIS — Z0289 Encounter for other administrative examinations: Secondary | ICD-10-CM

## 2014-09-21 ENCOUNTER — Other Ambulatory Visit: Payer: Self-pay | Admitting: Internal Medicine

## 2014-09-22 ENCOUNTER — Telehealth: Payer: Self-pay | Admitting: Internal Medicine

## 2014-09-22 MED ORDER — PHENTERMINE HCL 37.5 MG PO CAPS: 37.5000 mg | ORAL_CAPSULE | ORAL | Status: DC

## 2014-09-22 NOTE — Telephone Encounter (Signed)
Pt request refill of the following: phentermine 37.5 MG capsule   Phamacy:  [facility]

## 2014-09-22 NOTE — Telephone Encounter (Signed)
Left message on voicemail to call office.  

## 2014-09-22 NOTE — Telephone Encounter (Signed)
#  30 only  Needs return office visit prior to any further refills

## 2014-09-22 NOTE — Telephone Encounter (Signed)
Pt requesting refill, last seen 08/2013. Please advise if can fill?

## 2014-09-23 NOTE — Telephone Encounter (Signed)
Left message on voicemail to call office.  

## 2014-09-23 NOTE — Telephone Encounter (Signed)
Pt has been scheduled.  °

## 2014-10-04 ENCOUNTER — Encounter: Payer: Self-pay | Admitting: Internal Medicine

## 2014-10-04 ENCOUNTER — Ambulatory Visit (INDEPENDENT_AMBULATORY_CARE_PROVIDER_SITE_OTHER): Payer: Commercial Managed Care - PPO | Admitting: Internal Medicine

## 2014-10-04 VITALS — BP 146/90 | HR 103 | Temp 98.3°F | Resp 20 | Ht 61.75 in | Wt 182.0 lb

## 2014-10-04 DIAGNOSIS — F909 Attention-deficit hyperactivity disorder, unspecified type: Secondary | ICD-10-CM

## 2014-10-04 DIAGNOSIS — J4520 Mild intermittent asthma, uncomplicated: Secondary | ICD-10-CM

## 2014-10-04 NOTE — Progress Notes (Signed)
Pre visit review using our clinic review tool, if applicable. No additional management support is needed unless otherwise documented below in the visit note. 

## 2014-10-04 NOTE — Patient Instructions (Signed)
It is important that you exercise regularly, at least 20 minutes 3 to 4 times per week.  If you develop chest pain or shortness of breath seek  medical attention.  Return in one year for follow-up   

## 2014-10-04 NOTE — Progress Notes (Signed)
Subjective:    Patient ID: [name], female    DOB: [birth year], 43 y.o.   MRN: [id number]  HPI 43 year old patient who has a history of mild intermittent asthma.  This has been fairly stable.  She has had to borrow her son's metered dose inhaler occasionally over the past few months. She has a history of exogenous obesity and has resumed efforts at weight loss. In general doing reasonably well She does have a history of ADHD, but presently no longer in school and not on stimulants  Social history recently engaged with wedding date scheduled for spring 2016   No past medical history on file.  History   Social History  . Marital Status: Divorced    Spouse Name: N/A    Number of Children: N/A  . Years of Education: N/A   Occupational History  . Not on file.   Social History Main Topics  . Smoking status: Never Smoker   . Smokeless tobacco: Never Used  . Alcohol Use: Not on file  . Drug Use: Not on file  . Sexual Activity: Not on file   Other Topics Concern  . Not on file   Social History Narrative    No past surgical history on file.  No family history on file.  Allergies  Allergen Reactions  . Penicillins     Current Outpatient Prescriptions on File Prior to Visit  Medication Sig Dispense Refill  . phentermine 37.5 MG capsule Take 1 capsule (37.5 mg total) by mouth every morning. 30 capsule 0   No current facility-administered medications on file prior to visit.    BP 146/90 mmHg  Pulse 103  Temp(Src) 98.3 F (36.8 C) (Oral)  Resp 20  Ht 5' 1.75" (1.568 m)  Wt 182 lb (82.555 kg)  BMI 33.58 kg/m2  SpO2 97%      Review of Systems  Constitutional: Positive for unexpected weight change.  HENT: Positive for congestion and sinus pressure. Negative for dental problem, hearing loss, rhinorrhea, sore throat and tinnitus.   Eyes: Negative for pain, discharge and visual disturbance.  Respiratory: Negative for cough and shortness of breath.     Cardiovascular: Negative for chest pain, palpitations and leg swelling.  Gastrointestinal: Negative for nausea, vomiting, abdominal pain, diarrhea, constipation, blood in stool and abdominal distention.  Genitourinary: Negative for dysuria, urgency, frequency, hematuria, flank pain, vaginal bleeding, vaginal discharge, difficulty urinating, vaginal pain and pelvic pain.  Musculoskeletal: Negative for joint swelling, arthralgias and gait problem.  Skin: Negative for rash.  Neurological: Negative for dizziness, syncope, speech difficulty, weakness, numbness and headaches.  Hematological: Negative for adenopathy.  Psychiatric/Behavioral: Negative for behavioral problems, dysphoric mood and agitation. The patient is not nervous/anxious.        Objective:   Physical Exam  Constitutional: She is oriented to person, place, and time. She appears well-developed and well-nourished.  HENT:  Head: Normocephalic.  Right Ear: External ear normal.  Left Ear: External ear normal.  Mouth/Throat: Oropharynx is clear and moist.  Eyes: Conjunctivae and EOM are normal. Pupils are equal, round, and reactive to light.  Neck: Normal range of motion. Neck supple. No thyromegaly present.  Cardiovascular: Normal rate, regular rhythm, normal heart sounds and intact distal pulses.   Pulmonary/Chest: Effort normal and breath sounds normal. No respiratory distress. She has no wheezes. She has no rales.  Abdominal: Soft. Bowel sounds are normal. She exhibits no mass. There is no tenderness.  Musculoskeletal: Normal range of motion.  Lymphadenopathy:  She has no cervical adenopathy.  Neurological: She is alert and oriented to person, place, and time.  Skin: Skin is warm and dry. No rash noted.  Psychiatric: She has a normal mood and affect. Her behavior is normal.          Assessment & Plan   Asthma.  Will renew prescription for albuterol Obesity.  Weight loss encouraged ADHD stable off  medication  Schedule CPX

## 2014-11-29 ENCOUNTER — Telehealth: Payer: Self-pay

## 2014-11-29 ENCOUNTER — Other Ambulatory Visit: Payer: Self-pay | Admitting: Internal Medicine

## 2014-11-29 MED ORDER — PHENTERMINE HCL 37.5 MG PO CAPS: 37.5000 mg | ORAL_CAPSULE | ORAL | Status: DC

## 2014-11-29 NOTE — Telephone Encounter (Signed)
Received a call from [place] on [address]. Stating that pt's phentermine was last refilled in Nov. 2015 and pt is telling the pharmacy that she has a written prescription for phentermine with multiple refills. Per the last office note on 11.30.2015 pt needs to schedule a physical.

## 2014-11-29 NOTE — Telephone Encounter (Signed)
Spoke to pharmacist Dhruv, told him I do not see a Rx was given to pt for Phentermine and that I called in #30 on 11/18 and that pt is to schedule physical. Told Dhruv okay to give pt #30 and 0 refill. Dhruv verbalized understanding.

## 2014-11-29 NOTE — Telephone Encounter (Signed)
Pt called and I spoke to pt regarding Rx for Phentermine. Pt said when she saw Dr. Raliegh Ip on 11/30 he gave her a written Rx for 6  Months and was told did not need to return till March also pt said she handed the Rx into the pharmacy and now they do not have it. Pt is very upset with the pharmacyTold pt I called in one month supply and will discuss with Dr. Raliegh Ip.  Discussed with Dr. Raliegh Ip, he did not remember but said okay to call in Rx for Phentermine with refills. Rx called into pharmacy  Pt notified Rx called into pharmacy with refills.

## 2015-01-03 ENCOUNTER — Telehealth: Payer: Self-pay | Admitting: Internal Medicine

## 2015-01-03 NOTE — Telephone Encounter (Signed)
Error/njr °

## 2015-04-22 ENCOUNTER — Encounter: Payer: Self-pay | Admitting: Gastroenterology

## 2016-01-24 ENCOUNTER — Other Ambulatory Visit: Payer: Self-pay | Admitting: Obstetrics and Gynecology

## 2016-01-24 ENCOUNTER — Other Ambulatory Visit (HOSPITAL_COMMUNITY)
Admission: RE | Admit: 2016-01-24 | Discharge: 2016-01-24 | Disposition: A | Discharge status: Home / Self Care | Payer: Commercial Managed Care - PPO | Source: Ambulatory Visit | Attending: Obstetrics and Gynecology | Admitting: Obstetrics and Gynecology

## 2016-01-24 DIAGNOSIS — Z01419 Encounter for gynecological examination (general) (routine) without abnormal findings: Secondary | ICD-10-CM | POA: Insufficient documentation

## 2016-01-24 DIAGNOSIS — Z1151 Encounter for screening for human papillomavirus (HPV): Secondary | ICD-10-CM | POA: Insufficient documentation

## 2016-01-26 LAB — CYTOLOGY - PAP

## 2016-02-08 ENCOUNTER — Encounter: Payer: Self-pay | Admitting: Genetic Counselor

## 2016-02-08 ENCOUNTER — Telehealth: Payer: Self-pay | Admitting: Genetic Counselor

## 2016-02-08 NOTE — Telephone Encounter (Signed)
Verified address and insurance, faxed referring office, mailed new pt packets, scheduled intake

## 2016-02-14 ENCOUNTER — Other Ambulatory Visit (HOSPITAL_COMMUNITY): Payer: Self-pay | Admitting: *Deleted

## 2016-02-14 DIAGNOSIS — N644 Mastodynia: Secondary | ICD-10-CM

## 2016-02-23 ENCOUNTER — Ambulatory Visit
Admission: RE | Admit: 2016-02-23 | Discharge: 2016-02-23 | Disposition: A | Discharge status: Home / Self Care | Payer: No Typology Code available for payment source | Source: Ambulatory Visit | Attending: Obstetrics and Gynecology | Admitting: Obstetrics and Gynecology

## 2016-02-23 ENCOUNTER — Encounter (HOSPITAL_COMMUNITY): Payer: Self-pay

## 2016-02-23 ENCOUNTER — Ambulatory Visit (HOSPITAL_COMMUNITY)
Admission: RE | Admit: 2016-02-23 | Discharge: 2016-02-23 | Disposition: A | Discharge status: Home / Self Care | Payer: Self-pay | Source: Ambulatory Visit | Attending: Obstetrics and Gynecology | Admitting: Obstetrics and Gynecology

## 2016-02-23 VITALS — BP 138/86 | Temp 98.5°F | Ht 60.0 in | Wt 197.4 lb

## 2016-02-23 DIAGNOSIS — Z1239 Encounter for other screening for malignant neoplasm of breast: Secondary | ICD-10-CM

## 2016-02-23 DIAGNOSIS — N644 Mastodynia: Secondary | ICD-10-CM

## 2016-02-23 DIAGNOSIS — N6452 Nipple discharge: Secondary | ICD-10-CM

## 2016-02-23 HISTORY — DX: Endometriosis, unspecified: N80.9

## 2016-02-23 NOTE — Patient Instructions (Addendum)
Educational materials on self breast awareness given. Explained to [name] that she did not need a Pap smear today due to a history of a hysterectomy for benign reasons. Let patient know that she doesn't need any further Pap smears due to her history of a hysterectomy for benign reasons. Referred patient to the [facility] for diagnostic mammogram. Appointment scheduled for Thursday, February 23, 2016 at 1000. Let patient know will follow up with her within the next couple weeks with results of breast discharge by phone. [name] verbalized understanding.  [name], Arvil Chaco, RN 11:11 AM

## 2016-02-23 NOTE — Addendum Note (Signed)
Encounter addended by: Loletta Parish, RN on: 02/23/2016 11:50 AM<BR>     Documentation filed: Patient Instructions Section

## 2016-02-23 NOTE — Progress Notes (Signed)
Complaints of bilateral breast being warm to touch and painful. Patient states the pain comes and goes rating at a 4-5 out of 10. Patient complained of left breast discharge that is yellow to white in color stating it has been happening spontaneously for the past 16 years.  Pap Smear:  Pap smear not completed today. Last Pap smear was 01/24/2016 at [facility] and normal with negative HPV. Per patient had an abnormal Pap smear when she was 45 years old that cryotherapy was completed for follow up. Patient stated she hasn't had any abnormal Pap smears since. Patient has a history of a hysterectomy 06/09/2002 for fibroids and AUB. No further Pap smears are needed due to history of a hysterectomy for benign reasons. Last Pap smear result is in EPIC.  Physical exam: Breasts Breasts symmetrical. No skin abnormalities bilateral breasts. No nipple retraction bilateral breasts. No nipple discharge left breast. Thick whitish colored discharge expressed from right breast on exam. Sample sent to cytology. No lymphadenopathy. No lumps palpated bilateral breasts. Complaints of left breast pain at 3 o'clock, 9 o'clock, and 12 o'clock on exam. Complaints of right breast pain at  3 o'clock and 9 o'clock on exam. Referred patient to the [facility] for diagnostic mammogram. Appointment scheduled for Thursday, February 23, 2016 at 1000.  Pelvic/Bimanual No Pap smear completed today since last Pap smear was 01/24/2016 and patient has a history of a hysterectomy for benign reasons.. Pap smear not indicated per BCCCP guidelines.   Smoking History: Patient has never smoked.  Patient Navigation: Patient education provided. Access to services provided for patient through [facility] program.

## 2016-02-23 NOTE — Addendum Note (Signed)
Encounter addended by: Armond Hang, LPN on: [id number]  [id number]<BR>     Documentation filed: [employer], Orders

## 2016-02-24 ENCOUNTER — Encounter (HOSPITAL_COMMUNITY): Payer: Self-pay | Admitting: *Deleted

## 2016-03-13 ENCOUNTER — Other Ambulatory Visit: Payer: Self-pay

## 2016-03-13 ENCOUNTER — Ambulatory Visit (HOSPITAL_BASED_OUTPATIENT_CLINIC_OR_DEPARTMENT_OTHER): Payer: Self-pay | Admitting: Genetic Counselor

## 2016-03-13 DIAGNOSIS — Z8049 Family history of malignant neoplasm of other genital organs: Secondary | ICD-10-CM

## 2016-03-13 DIAGNOSIS — Z315 Encounter for genetic counseling: Secondary | ICD-10-CM

## 2016-03-13 DIAGNOSIS — Z8371 Family history of colonic polyps: Secondary | ICD-10-CM

## 2016-03-13 DIAGNOSIS — Z800 Family history of malignant neoplasm of digestive organs: Secondary | ICD-10-CM

## 2016-03-13 DIAGNOSIS — Z803 Family history of malignant neoplasm of breast: Secondary | ICD-10-CM

## 2016-03-13 DIAGNOSIS — Z809 Family history of malignant neoplasm, unspecified: Secondary | ICD-10-CM

## 2016-03-13 DIAGNOSIS — Z8051 Family history of malignant neoplasm of kidney: Secondary | ICD-10-CM

## 2016-03-14 ENCOUNTER — Encounter: Payer: Self-pay | Admitting: Genetic Counselor

## 2016-03-14 DIAGNOSIS — Z803 Family history of malignant neoplasm of breast: Secondary | ICD-10-CM | POA: Insufficient documentation

## 2016-03-14 DIAGNOSIS — Z800 Family history of malignant neoplasm of digestive organs: Secondary | ICD-10-CM | POA: Insufficient documentation

## 2016-03-14 NOTE — Progress Notes (Signed)
REFERRING PROVIDER: Thurnell Lose, MD  PRIMARY PROVIDER:  Nyoka Cowden, MD  PRIMARY REASON FOR VISIT:  1. Family history of colon cancer   2. Family history of breast cancer in female   3. Family history of colonic polyps   4. Family history of renal cancer   5. Family history of cervical cancer   6. Family history of cancer      HISTORY OF PRESENT ILLNESS:   [name], a 45 y.o. female, was seen for a [facility] cancer genetics consultation at the request of Dr. Simona Huh due to a family history of cancer.  [name] presents to clinic today to discuss the possibility of a hereditary predisposition to cancer, genetic testing, and to further clarify her future cancer risks, as well as potential cancer risks for family members.   [name] is a 45 y.o. female with no personal history of cancer.    HORMONAL RISK FACTORS:  Menarche was at age 45.  First live birth at age 33.  OCP use for approximately - "off an on for a few years" Ovaries intact: has "1/4 of an ovary and part of a fallopian tube" still remaining.  Hysterectomy: yes.  Menopausal status: postmenopausal.  HRT use: HCG injections for weight loss  Colonoscopy: yes, most recent was 4 years ago; normal. Mammogram within the last year: yes and currently awaiting biopsy results Number of breast biopsies: 1. Up to date with pelvic exams:  yes. Any excessive radiation exposure in the past:  Used to work in the ED at [employer]; reports some history of secondhand smoke exposure as a child (her mother smoked)  Past Medical History  Diagnosis Date  . Endometriosis     Past Surgical History  Procedure Laterality Date  . Abdominal hysterectomy      Social History   Social History  . Marital Status: Divorced    Spouse Name: N/A  . Number of Children: N/A  . Years of Education: N/A   Social History Main Topics  . Smoking status: Never Smoker   . Smokeless tobacco: Never Used  . Alcohol Use:  No  . Drug Use: No  . Sexual Activity: Yes    Birth Control/ Protection: Surgical   Other Topics Concern  . Not on file   Social History Narrative     FAMILY HISTORY:  We obtained a detailed, 4-generation family history.  Significant diagnoses are listed below: Family History  Problem Relation Age of Onset  . Hypertension Mother   . Cancer Mother 67    "back cancer"  . Other Mother     hysterectomy at 78 for "pre-cancerous cervical cells"  . Hypertension Father   . Colon cancer Father 109  . Cervical cancer Sister     full sister dx. unspecified age  . Colon polyps Sister     unspecified number  . Breast cancer Maternal Grandmother     unspecified age  . Diabetes Maternal Grandmother   . Diabetes Paternal Grandfather   . Colon cancer Paternal Aunt     (x2) paternal aunts with colon cancer at unspecified ages; lim info  . Colon cancer Paternal Uncle     dx. unspecified age; lim info  . Colon polyps Sister     paternal half-sister w/ colon polyps dx. age 22 or younger - unspecified number  . Colon cancer Sister 7    paternal half-sister   . Colon polyps Sister     paternal half-sister; unspecified number  .  Colon cancer Sister     paternal half-sister dx. before age 61; has had two colon cancers  . Breast cancer Sister     paternal half-sister dx under age 1  . Renal cancer Sister 77    paternal half-sister dx. w/ "[facility] - lymphoid renal cancer"  . Cancer Sister 79    paternal half-sister dx cancer of wrist that was not a skin cancer  . Colon polyps Son 73    has had a "bleeding polyp"  . Cervical cancer Other 25    niece; +hysterectomy  . Breast cancer Paternal Aunt     (x3) paternal aunts with breast cancer at unspecified ages; lim info    [name] has three sons, ages 45, 27, and 77.  Her oldest son had a "bleeding polyp" found on colonoscopy at the age of 58.  He is expecting his first child (a daughter) soon.  [name] has one full sister who is  currently 54 and has a history of cervical cancer and colon polyps (unspecified number).  This sister has one son and one daughter; her daughter has a history of cervical cancer, diagnosed at 40.  [name] has one maternal half-brother who is currently 60 and has never had cancer.  He has two sons who have also not had cancer.  [name] also has three paternal half-sisters.  One half-sister is 66 and has never had cancer, but does have a history of colon polyps (unspecified number).  Another half-sister is 16 and was diagnosed with colon cancer at the age of 90; she also has a history of unspecified number of colon polyps.  This sister has three sons and one daughter who are cancer-free but who are still very young.  [name]'s oldest paternal half-sister is currently 70 and has a history of two colon cancers, the first of which was diagnosed at the age of 78, a history of breast cancer younger than 79, a history of "lymphoid renal cancer" at age 37, and history of a wrist cancer ("not a skin cancer") at the age of 54.  [name] reports that none of these cancers were metastases, but instead were primaries.  This sister has two sons who are living, are below age 70, and are cancer-free; she also has a daughter who was stillborn.  [name]'s mother died of pneumonia at age 3.  She has a history of "back cancer" at the age of 64 and she had a hysterectomy at the age of 81 due to pre-cancerous cervical cells.  [name]'s mother is an only child.  Her parents have both passed away an [name] has limited information for them.  She does report that her maternal grandmother was diagnosed with breast cancer at an unspecified age.  She also reports a distant maternal family history of colon cancer, but is not sure which of her relatives are affected.  [name]'s father was diagnosed with colon cancer at 103 and he passed away a year later.  He had five full brothers  and seven full sisters.  Two brothers have passed away, but [name] has very limited information for them and her other paternal aunts and uncles.  She does report that one paternal uncle and two aunts have had colon cancer at unspecified ages.  Three aunts have been diagnosed with breast cancer.  She has no information for any of her paternal first cousins.  Her paternal grandmother passed away in her 34s and her grandfather died of diabetes-related illness in his late 24s.  She has no further information for any of her paternal great aunts/uncles or great grandparents.  Patient's maternal ancestors are of Caucasian and Native [place] and [place] descent, and paternal ancestors are of [place] descent. There is no reported Ashkenazi Jewish ancestry. There is no known consanguinity.  GENETIC COUNSELING ASSESSMENT: [name] is a 45 y.o. female with a family history of colon and breast cancers and colon polyps which is somewhat suggestive of a hereditary cancers syndrome and predisposition to cancer. We, therefore, discussed and recommended the following at today's visit.   DISCUSSION: We reviewed the characteristics, features and inheritance patterns of hereditary cancer syndromes, particularly those caused by mutations in the BRCA1/2, Lynch syndrome, APC, and TP53 genes. We also discussed genetic testing, including the appropriate family members to test, the process of testing, insurance coverage and turn-around-time for results. We discussed the implications of a negative, positive and/or variant of uncertain significant result. We recommended [name] pursue genetic testing for the 28-gene [facility] Hereditary Cancer Panel through [facility]  The [facility] gene panel offered by [employer] includes sequencing and/or deletion/duplication testing of the following 28 genes: APC, ATM, BARD1, BMPR1A, BRCA1, BRCA2, BRIP1,  CHD1, CDK4, CDKN2A, CHEK2, EPCAM (large rearrangement only), GREM1, MLH1, MSH2, MSH6, MUTYH, NBN, PALB2, PMS2, POLD1, POLE, PTEN, RAD51C, RAD51D, SMAD4, STK11, and TP53.     Based on [name]'s family history of cancer, she meets medical criteria for genetic testing. Since [name] in uninsured and meets [facility]' criteria for their Financial Assistance Program, she should have no out-of-pocket cost for genetic testing.  She is instructed to call the genetic counselor, if she erroneously receives a bill for genetic testing.  PLAN: After considering the risks, benefits, and limitations, [name]  provided informed consent to pursue genetic testing and the blood sample was sent to [facility] for analysis of the 28-gene [facility] Hereditary Cancer Panel. Results should be available within approximately 3 weeks' time, at which point they will be disclosed by telephone to [name], as will any additional recommendations warranted by these results. [name] will receive a summary of her genetic counseling visit and a copy of her results once available. This information will also be available in Epic. We encouraged [name] to remain in contact with cancer genetics annually so that we can continuously update the family history and inform her of any changes in cancer genetics and testing that may be of benefit for her family. [name]'s questions were answered to her satisfaction today. Our contact information was provided should additional questions or concerns arise.  Thank you for the referral and allowing [place] to share in the care of your patient.   Jeanine Luz, MS, [facility] Certified Genetic Counselor [email or web address] Phone: [phone]  The patient was seen for a total of 60 minutes in face-to-face genetic counseling.  This patient was discussed with Drs. Magrinat, Lindi Adie and/or Burr Medico who agrees with the above.     _______________________________________________________________________ For Office Staff:  Number of people involved in session: 1 Was an Intern/ student involved with case: no

## 2016-03-30 ENCOUNTER — Telehealth: Payer: Self-pay | Admitting: Genetic Counselor

## 2016-03-30 NOTE — Telephone Encounter (Signed)
Discussed with [name] that her genetic test results were negative for pathogenic mutations within any of 28 genes on the [facility] Hereditary Cancer Panel ([facility]) that would increase her genetic risk for breast, colon, ovarian, or other related cancers.  Additionally, no uncertain changes were found.  Discussed that this result cannot rule out a genetic cause for the family history of cancer.  The family history is still suspicious of a hereditary cancer syndrome.  There is a significant paternal family history of colon and breast cancers and colon polyps--some of which are early-onset.  We discussed the importance that [name]'s affected relatives (especially her affected half-sisters) have genetic counseling and testing.  They all live in different states, so we discussed that they could use the [employer] website to locate a cancer genetic counselor in their local area.  Discussed that [name] will need to be screened as if she were high-risk for colon and breast cancers in the meantime.  She should be keeping up with her colonoscopies (probably every 3-5 years, or more often as based on her doctor's recommendations and/or based on abnormal findings).  She should be sure to continue to have annual mammograms, and, based on her half-sister's diagnosis at 68, and the additional family history in 3 paternal aunts, she may be eligible for additional screening, such as breast MRIs.  She should discuss with her doctor and determine whether she should be seen for consult at a high risk breast center.  Again, further testing of affected relatives would be very helpful to further elucidation of her own cancer risks.  She should like a copy of her test results mailed to her and I am happy to do that.  I will send along with a results letter.  She is welcome to call with any questions, and I encouraged her to update the family history with [place] in the future.

## 2016-04-04 ENCOUNTER — Encounter: Payer: Self-pay | Admitting: Genetic Counselor

## 2016-04-04 ENCOUNTER — Ambulatory Visit: Payer: Self-pay | Admitting: Genetic Counselor

## 2016-04-04 DIAGNOSIS — Z1379 Encounter for other screening for genetic and chromosomal anomalies: Secondary | ICD-10-CM

## 2016-04-04 DIAGNOSIS — Z8051 Family history of malignant neoplasm of kidney: Secondary | ICD-10-CM

## 2016-04-04 DIAGNOSIS — Z809 Family history of malignant neoplasm, unspecified: Secondary | ICD-10-CM

## 2016-04-04 DIAGNOSIS — Z800 Family history of malignant neoplasm of digestive organs: Secondary | ICD-10-CM

## 2016-04-04 DIAGNOSIS — Z803 Family history of malignant neoplasm of breast: Secondary | ICD-10-CM

## 2016-04-04 NOTE — Progress Notes (Signed)
GENETIC TEST RESULT  HPI: [name] was previously seen in the [place] clinic due to a family history of early-onset colon, breast and other cancers, and concerns regarding a hereditary predisposition to cancer. Please refer to our prior cancer genetics clinic note from Mar 13, 2016 for more information regarding [name] medical, social and family histories, and our assessment and recommendations, at the time. [name]'s recent genetic test results were disclosed to her, as were recommendations warranted by these results. These results and recommendations are discussed in more detail below.  GENETIC TEST RESULTS: At the time of [name]'s visit on 03/13/16, we recommended she pursue genetic testing of the 28-gene myRisk Hereditary Cancer Panel through [facility].  The [facility] Hereditary Cancer Panel offered by [facility] ([place], [place]) includes sequencing and/or deletion/duplication testing of the following 28 genes: APC, ATM, BARD1, BMPR1A, BRCA1, BRCA2, BRIP1, CHD1, CDK4, CDKN2A, CHEK2, EPCAM (large rearrangement only), GREM1, MLH1, MSH2, MSH6, MUTYH, NBN, PALB2, PMS2, POLD1, POLE, PTEN, RAD51C, RAD51D, SMAD4, STK11, and TP53.  Those results are now back, the report date for which is Mar 28, 2016.  Genetic testing was normal, and did not reveal a deleterious mutation in these genes.  Additionally, no variants of uncertain significance (VUSes) were found.  The test report will be scanned into EPIC and will be located under the Results Review tab in the Pathology>Molecular Pathology section.   We discussed with [name] that since the current genetic testing is not perfect, it is possible there may be a gene mutation in one of these genes that current testing cannot detect, but that chance is small. We also discussed, that it is possible that another gene that has not yet been discovered, or that we have not yet  tested, is responsible for the cancer diagnoses in the family, and it is, therefore, important to remain in touch with cancer genetics in the future so that we can continue to offer [name] the most up-to-date genetic testing.   CANCER SCREENING RECOMMENDATIONS: Given [name]'s family history, we must interpret these negative results with some caution.  Families with features suggestive of hereditary risk for cancer tend to have multiple family members with cancer, diagnoses in multiple generations and diagnoses before the age of 53. [name] family exhibits some of these features. Thus this result may simply reflect our current inability to detect all mutations within these genes or there may be a different gene that has not yet been discovered or tested.  Further testing of affected relatives would be helpful for further understanding of [name]'s own cancer risks as well as for that of the family.  [name] should continue to follow her doctors' recommendations for future cancer screening.  These recommendations will likely include, at a minimum, having colonoscopies every 3-5 years, annual mammograms, annual clinical breast exams, monthly self breast exams, and routine gynecological exams.  She should discuss with her doctor whether or not she may be eligible for additional cancer screening based on family history of cancer, such as annual breast MRIs.   RECOMMENDATIONS FOR FAMILY MEMBERS: Women in this family are likely at an increased risk of developing breast and colon cancer, over the general population risk, simply due to the family history of these cancers. We recommended women in this family have a yearly mammogram beginning at age 26, or 51 years younger than the earliest onset  of cancer in the immediate family, an annual clinical breast exam, and perform monthly breast self-exams. Women in this family should also have a gynecological exam as recommended by  their primary provider. All family members should likely have a colonoscopy by age 17 or younger, as recommended by their provider.  Based on [name]'s family history, we recommended her paternal half-sisters, who have been diagnosed with early-onset colon, breast, and/or renal cancers, have genetic counseling and testing.  Her paternal aunts/uncle who have had colon or breast cancer are also eligible for genetic counseling and testing. [name] will let us know if we can be of any assistance in coordinating genetic counseling and/or testing for these family members.  These relatives all live in different areas, so they can also use the [employer] website to locate cancer genetic counselors in their areas.  FOLLOW-UP: Lastly, we discussed with [name] that cancer genetics is a rapidly advancing field and it is possible that new genetic tests will be appropriate for her and/or her family members in the future. We encouraged her to remain in contact with cancer genetics on an annual basis so we can update her personal and family histories and let her know of advances in cancer genetics that may benefit this family.   Our contact number was provided. [name]'s questions were answered to her satisfaction, and she knows she is welcome to call us at anytime with additional questions or concerns.   Jeanine Luz, MS, [facility] Certified Genetic Counselor [email or web address] Phone: [phone]

## 2016-10-04 ENCOUNTER — Encounter: Payer: Self-pay | Admitting: Family Medicine

## 2016-10-04 ENCOUNTER — Ambulatory Visit (INDEPENDENT_AMBULATORY_CARE_PROVIDER_SITE_OTHER): Payer: PRIVATE HEALTH INSURANCE | Admitting: Family Medicine

## 2016-10-04 VITALS — BP 138/88 | HR 99 | Temp 97.8°F | Wt 191.4 lb

## 2016-10-04 DIAGNOSIS — L509 Urticaria, unspecified: Secondary | ICD-10-CM

## 2016-10-04 MED ORDER — METHYLPREDNISOLONE ACETATE 80 MG/ML IJ SUSP
80.0000 mg | Freq: Once | INTRAMUSCULAR | Status: AC
  Administered 2016-10-04: 80 mg via INTRAMUSCULAR

## 2016-10-04 MED ORDER — PREDNISONE 10 MG PO TABS: ORAL_TABLET | ORAL | 0 refills | Status: DC

## 2016-10-04 NOTE — Patient Instructions (Addendum)
Please take allegra, claritin, or zyrtec. Please take prednisone as directed. Also, please have epipen ready if needed. Follow up with allergy referral as directed.   Hives Introduction Hives (urticaria) are itchy, red, swollen areas on your skin. Hives can show up on any part of your body, and they can vary in size. They can be as small as the tip of a pen or much larger. Hives often fade within 24 hours (acute hives). In other cases, new hives show up after old ones fade. This can continue for many days or weeks (chronic hives). Hives are caused by your body's reaction to an irritant or to something that you are allergic to (trigger). You can get hives right after being around a trigger or hours later. Hives do not spread from person to person (are not contagious). Hives may get worse if you scratch them, if you exercise, or if you have worries (emotional stress). Follow these instructions at home: Medicines  Take or apply over-the-counter and prescription medicines only as told by your doctor.  If you were prescribed an antibiotic medicine, use it as told by your doctor. Do not stop taking the antibiotic even if you start to feel better. Skin Care  Apply cool, wet cloths (cool compresses) to the itchy, red, swollen areas.  Do not scratch your skin. Do not rub your skin. General instructions  Do not take hot showers or baths. This can make itching worse.  Do not wear tight clothes.  Use sunscreen and wear clothing that covers your skin when you are outside.  Avoid any triggers that cause your hives. Keep a journal to help you keep track of what causes your hives. Write down:  What medicines you take.  What you eat and drink.  What products you use on your skin.  Keep all follow-up visits as told by your doctor. This is important. Contact a doctor if:  Your symptoms are not better with medicine.  Your joints are painful or swollen. Get help right away if:  You have a  fever.  You have belly pain.  Your tongue or lips are swollen.  Your eyelids are swollen.  Your chest or throat feels tight.  You have trouble breathing or swallowing. These symptoms may be an emergency. Do not wait to see if the symptoms will go away. Get medical help right away. Call your local emergency services (911 in the [place] Do not drive yourself to the hospital.  This information is not intended to replace advice given to you by your health care provider. Make sure you discuss any questions you have with your health care provider. Document Released: 07/31/2008 Document Revised: 03/29/2016 Document Reviewed: 08/10/2015  2017 Elsevier

## 2016-10-04 NOTE — Progress Notes (Signed)
Subjective:    Patient ID: [name], female    DOB: 1971-07-24, 45 y.o.   MRN: [id number]  HPI  [name] is a 45 year old female who presents today with hives that she noticed over the past 5 days. Hives will appear without a known trigger and treatment with benedryl has provided benefit. Three days ago she experienced hives while at work where she was evaluated and treated in the ED at her work. She received steroids, ranitidine, epipen, and allergy referral.  Today, she is experiencing hives again that are located on her abdomen, back, and upper chest.  She is extremely anxious and is scratching throughout the exam. She reports that her experience in the ED has concerned her as she was experiencing throat tightness and she is concerned this will happen again. She has attempted to make an appointment with an allergist and is waiting to be seen in approximately 6 to 8 weeks. Today, she denies fever, chills, sweats, throat tightness, SOB, swelling of lips/tongue/eyelids, or trouble swallowing. No new medications, foods, detergents, lotions, fabric softener, perfumes, makeup, or pets.   Review of Systems  Constitutional: Negative for chills and fever.  HENT: Negative for congestion, rhinorrhea, sneezing and sore throat.   Respiratory: Negative for cough, shortness of breath and wheezing.   Cardiovascular: Negative for chest pain and palpitations.  Gastrointestinal: Negative for abdominal pain, diarrhea, nausea and vomiting.  Musculoskeletal: Negative for myalgias.  Skin:       Hives  Neurological: Negative for dizziness, weakness, light-headedness and headaches.   Past Medical History:  Diagnosis Date  . Endometriosis      Social History   Social History  . Marital status: Divorced    Spouse name: N/A  . Number of children: N/A  . Years of education: N/A   Occupational History  . Not on file.   Social History Main Topics  . Smoking status: Never Smoker  .  Smokeless tobacco: Never Used  . Alcohol use No  . Drug use: No  . Sexual activity: Yes    Birth control/ protection: Surgical   Other Topics Concern  . Not on file   Social History Narrative  . No narrative on file    Past Surgical History:  Procedure Laterality Date  . ABDOMINAL HYSTERECTOMY      Family History  Problem Relation Age of Onset  . Hypertension Mother   . Cancer Mother 30    "back cancer"  . Other Mother     hysterectomy at 47 for "pre-cancerous cervical cells"  . Hypertension Father   . Colon cancer Father 101  . Cervical cancer Sister     full sister dx. unspecified age  . Colon polyps Sister     unspecified number  . Breast cancer Maternal Grandmother     unspecified age  . Diabetes Maternal Grandmother   . Diabetes Paternal Grandfather   . Colon cancer Paternal Aunt     (x2) paternal aunts with colon cancer at unspecified ages; lim info  . Colon cancer Paternal Uncle     dx. unspecified age; lim info  . Colon polyps Sister     paternal half-sister w/ colon polyps dx. age 31 or younger - unspecified number  . Colon cancer Sister 7    paternal half-sister   . Colon polyps Sister     paternal half-sister; unspecified number  . Colon cancer Sister     paternal half-sister dx. before age 68; has  had two colon cancers  . Breast cancer Sister     paternal half-sister dx under age 62  . Renal cancer Sister 42    paternal half-sister dx. w/ "[facility] - lymphoid renal cancer"  . Cancer Sister 20    paternal half-sister dx cancer of wrist that was not a skin cancer  . Colon polyps Son 89    has had a "bleeding polyp"  . Cervical cancer Other 25    niece; +hysterectomy  . Breast cancer Paternal Aunt     (x3) paternal aunts with breast cancer at unspecified ages; lim info    Allergies  Allergen Reactions  . Penicillins     Current Outpatient Prescriptions on File Prior to Visit  Medication Sig Dispense Refill  . phentermine 37.5 MG capsule Take 1  capsule (37.5 mg total) by mouth every morning. 30 capsule 3   No current facility-administered medications on file prior to visit.     BP 138/88   Pulse 99   Temp 97.8 F (36.6 C) (Oral)   Wt 191 lb 6.4 oz (86.8 kg)   SpO2 97%   BMI 37.38 kg/m       Objective:   Physical Exam  Constitutional: She is oriented to person, place, and time. She appears well-developed and well-nourished.  Patient is scratching and appears anxious during exam  HENT:  Right Ear: Tympanic membrane normal.  Left Ear: Tympanic membrane normal.  Nose: No rhinorrhea.  Mouth/Throat: Uvula is midline and mucous membranes are normal. No oropharyngeal exudate or posterior oropharyngeal erythema.  Eyes: Pupils are equal, round, and reactive to light.  Neck: Neck supple.  Cardiovascular: Normal rate and regular rhythm.   Pulmonary/Chest: Effort normal and breath sounds normal. She has no wheezes. She has no rales.  Abdominal: Soft. Bowel sounds are normal. There is no tenderness.  Musculoskeletal: She exhibits no edema.  Lymphadenopathy:    She has no cervical adenopathy.  Neurological: She is alert and oriented to person, place, and time.  Skin: Skin is warm and dry.  Erythematous, pruritic plaques scattered on torso in a serpiginous pattern      Assessment & Plan:  1. Hives Exam and history support treatment for urticaria. No known trigger has been identified. Advised patient to keep a diary of any new products and use hypoallergenic products. Advised patient that anxiety and stress can exacerbate these symptoms.  Depo-Medrol provided in office with taper dose of prednisone to be completed at home. Allegra, Claritin, or Zyrtec will be added. Advised avoidance of NSAIDs or new medications. We discussed/reviewed use of epipen. She was able to articulate proper use of this pen and the need to seek medical attention if symptoms of throat tightness, swelling of lips/eyelids/tongue, or trouble breathing or  swallowing. She voiced understanding and agreed with plan.  - methylPREDNISolone acetate (DEPO-MEDROL) injection 80 mg; Inject 1 mL (80 mg total) into the muscle once.  Contacted allergist for possible earlier appointment with this patient. She was advised to keep her scheduled appointment and she will be placed on a cancellation list for earlier time if available.  Delano Metz, FNP-C

## 2016-10-04 NOTE — Progress Notes (Signed)
Pre visit review using our clinic review tool, if applicable. No additional management support is needed unless otherwise documented below in the visit note. 

## 2016-11-21 ENCOUNTER — Ambulatory Visit: Payer: Self-pay | Admitting: Internal Medicine

## 2016-12-21 ENCOUNTER — Encounter: Payer: Self-pay | Admitting: Internal Medicine

## 2016-12-21 ENCOUNTER — Telehealth: Payer: Self-pay | Admitting: Internal Medicine

## 2016-12-21 ENCOUNTER — Ambulatory Visit (INDEPENDENT_AMBULATORY_CARE_PROVIDER_SITE_OTHER): Payer: PRIVATE HEALTH INSURANCE | Admitting: Internal Medicine

## 2016-12-21 VITALS — BP 120/86 | HR 121 | Temp 97.7°F | Ht 60.0 in | Wt 185.6 lb

## 2016-12-21 DIAGNOSIS — J340 Abscess, furuncle and carbuncle of nose: Secondary | ICD-10-CM | POA: Diagnosis not present

## 2016-12-21 DIAGNOSIS — R911 Solitary pulmonary nodule: Secondary | ICD-10-CM | POA: Diagnosis not present

## 2016-12-21 DIAGNOSIS — J101 Influenza due to other identified influenza virus with other respiratory manifestations: Secondary | ICD-10-CM | POA: Diagnosis not present

## 2016-12-21 DIAGNOSIS — J4520 Mild intermittent asthma, uncomplicated: Secondary | ICD-10-CM | POA: Diagnosis not present

## 2016-12-21 DIAGNOSIS — N281 Cyst of kidney, acquired: Secondary | ICD-10-CM | POA: Diagnosis not present

## 2016-12-21 MED ORDER — HYDROCODONE-HOMATROPINE 5-1.5 MG/5ML PO SYRP: 5.0000 mL | ORAL_SOLUTION | Freq: Four times a day (QID) | ORAL | 0 refills | Status: DC | PRN

## 2016-12-21 MED ORDER — DOXYCYCLINE HYCLATE 100 MG PO TABS: 100.0000 mg | ORAL_TABLET | Freq: Two times a day (BID) | ORAL | 0 refills | Status: DC

## 2016-12-21 MED ORDER — OSELTAMIVIR PHOSPHATE 75 MG PO CAPS: 75.0000 mg | ORAL_CAPSULE | Freq: Two times a day (BID) | ORAL | 0 refills | Status: DC

## 2016-12-21 NOTE — Telephone Encounter (Signed)
Pharmacy has also called because they have a question about the instructions and the length of the prescribed   oseltamivir (TAMIFLU) 75 MG capsule  Pharm states the regular dosage is BID for 5 days, or one a day for 7 days.  This rx is for BID/ 7 days.  Would like to know if this is correct?  [facility]/ [address]

## 2016-12-21 NOTE — Telephone Encounter (Signed)
Verbal orders given to pharmacy per Dr Raliegh Ip for BID x 5 days.

## 2016-12-21 NOTE — Telephone Encounter (Signed)
Twice a day for 5 days

## 2016-12-21 NOTE — Progress Notes (Signed)
Pre visit review using our clinic review tool, if applicable. No additional management support is needed unless otherwise documented below in the visit note. 

## 2016-12-21 NOTE — Progress Notes (Signed)
Subjective:    Patient ID: [name], female    DOB: [birth year], 46 y.o.   MRN: [id number]  HPI 46 year old patient who has a history of asthma.  She presents with a four-day history of increasing cough, wheezing, myalgias and productive cough.  2 family members have been diagnosed with influenza A. She is also noted a rash involving the bridge of her nose.  This apparently started as a pustule. She complains of significant myalgias, but no documented fever.  She states that she has been evaluated in [facility] and states that she has a pulmonary nodule noted on CT scan. She also states that she has a very large renal cyst and needed to be evaluated by urology  She wishes her medical care to be delivered in [place] and not [employer]  Past Medical History:  Diagnosis Date  . Endometriosis      Social History   Social History  . Marital status: Divorced    Spouse name: N/A  . Number of children: N/A  . Years of education: N/A   Occupational History  . Not on file.   Social History Main Topics  . Smoking status: Never Smoker  . Smokeless tobacco: Never Used  . Alcohol use No  . Drug use: No  . Sexual activity: Yes    Birth control/ protection: Surgical   Other Topics Concern  . Not on file   Social History Narrative  . No narrative on file    Past Surgical History:  Procedure Laterality Date  . ABDOMINAL HYSTERECTOMY      Family History  Problem Relation Age of Onset  . Hypertension Mother   . Cancer Mother 61    "back cancer"  . Other Mother     hysterectomy at 10 for "pre-cancerous cervical cells"  . Hypertension Father   . Colon cancer Father 69  . Cervical cancer Sister     full sister dx. unspecified age  . Colon polyps Sister     unspecified number  . Breast cancer Maternal Grandmother     unspecified age  . Diabetes Maternal Grandmother   . Diabetes Paternal Grandfather   . Colon cancer Paternal Aunt     (x2) paternal aunts  with colon cancer at unspecified ages; lim info  . Colon cancer Paternal Uncle     dx. unspecified age; lim info  . Colon polyps Sister     paternal half-sister w/ colon polyps dx. age 6 or younger - unspecified number  . Colon cancer Sister 7    paternal half-sister   . Colon polyps Sister     paternal half-sister; unspecified number  . Colon cancer Sister     paternal half-sister dx. before age 22; has had two colon cancers  . Breast cancer Sister     paternal half-sister dx under age 36  . Renal cancer Sister 42    paternal half-sister dx. w/ "[facility] - lymphoid renal cancer"  . Cancer Sister 60    paternal half-sister dx cancer of wrist that was not a skin cancer  . Colon polyps Son 54    has had a "bleeding polyp"  . Cervical cancer Other 25    niece; +hysterectomy  . Breast cancer Paternal Aunt     (x3) paternal aunts with breast cancer at unspecified ages; lim info    Allergies  Allergen Reactions  . Penicillins     Current Outpatient Prescriptions on File Prior to Visit  Medication  Sig Dispense Refill  . phentermine 37.5 MG capsule Take 1 capsule (37.5 mg total) by mouth every morning. 30 capsule 3   No current facility-administered medications on file prior to visit.     BP 120/86 (BP Location: Left Arm, Patient Position: Sitting, Cuff Size: Normal)   Pulse (!) 121   Temp 97.7 F (36.5 C) (Oral)   Ht 5' (1.524 m)   Wt 185 lb 9.6 oz (84.2 kg)   SpO2 98%   BMI 36.25 kg/m      Review of Systems  Constitutional: Positive for activity change, appetite change and fatigue.  HENT: Positive for congestion. Negative for dental problem, hearing loss, rhinorrhea, sinus pressure, sore throat and tinnitus.   Eyes: Negative for pain, discharge and visual disturbance.  Respiratory: Positive for shortness of breath and wheezing. Negative for cough.   Cardiovascular: Negative for chest pain, palpitations and leg swelling.  Gastrointestinal: Negative for abdominal  distention, abdominal pain, blood in stool, constipation, diarrhea, nausea and vomiting.  Genitourinary: Negative for difficulty urinating, dysuria, flank pain, frequency, hematuria, pelvic pain, urgency, vaginal bleeding, vaginal discharge and vaginal pain.  Musculoskeletal: Positive for myalgias. Negative for arthralgias, gait problem and joint swelling.  Skin: Negative for rash.  Neurological: Positive for headaches. Negative for dizziness, syncope, speech difficulty, weakness and numbness.  Hematological: Negative for adenopathy.  Psychiatric/Behavioral: Negative for agitation, behavioral problems and dysphoric mood. The patient is not nervous/anxious.        Objective:   Physical Exam  Constitutional: She is oriented to person, place, and time. She appears well-developed and well-nourished.  Pulse 120 No acute distress but appears unwell O2 saturation 98%  HENT:  Head: Normocephalic.  Right Ear: External ear normal.  Left Ear: External ear normal.  Mouth/Throat: Oropharynx is clear and moist.  Eyes: Conjunctivae and EOM are normal. Pupils are equal, round, and reactive to light.  Neck: Normal range of motion. Neck supple. No thyromegaly present.  Cardiovascular: Normal rate, regular rhythm, normal heart sounds and intact distal pulses.   Pulmonary/Chest: Effort normal and breath sounds normal.  Abdominal: Soft. Bowel sounds are normal. She exhibits no mass. There is no tenderness.  Musculoskeletal: Normal range of motion.  Lymphadenopathy:    She has no cervical adenopathy.  Neurological: She is alert and oriented to person, place, and time.  Skin: Skin is warm and dry. No rash noted.  Erythema, soft tissue swelling and some superficial ulceration involving the bridge of her nose  Psychiatric: She has a normal mood and affect. Her behavior is normal.          Assessment & Plan:   Flu syndrome.  Strong exposure history.  Likely influenza A.  Will treat with Tamiflu for 5  days Cellulitis of the nose.  Will treat with doxycycline.  History of pulmonary nodule.  Patient request.  Pulmonary referral History of large renal cyst.  Patient requests urology referral  These records and imaging studies are not available.  She was told to make these available for her referrals  Nyoka Cowden

## 2016-12-21 NOTE — Telephone Encounter (Signed)
Pt state that Dr. Raliegh Ip was going to give her a cream for her nose that had a infection in it and wanted to know if the antibiotics that was given was for that.

## 2016-12-21 NOTE — Patient Instructions (Addendum)
Take over-the-counter expectorants and cough medications such as  Mucinex DM.  Call if there is no improvement in 5 to 7 days or if  you develop worsening cough, fever, or new symptoms, such as shortness of breath or chest pain.   Influenza, Adult Influenza ("the flu") is an infection in the lungs, nose, and throat (respiratory tract). It is caused by a virus. The flu causes many common cold symptoms, as well as a high fever and body aches. It can make you feel very sick. The flu spreads easily from person to person (is contagious). Getting a flu shot (influenza vaccination) every year is the best way to prevent the flu. Follow these instructions at home:  Take over-the-counter and prescription medicines only as told by your doctor.  Use a cool mist humidifier to add moisture (humidity) to the air in your home. This can make it easier to breathe.  Rest as needed.  Drink enough fluid to keep your pee (urine) clear or pale yellow.  Cover your mouth and nose when you cough or sneeze.  Wash your hands with soap and water often, especially after you cough or sneeze. If you cannot use soap and water, use hand sanitizer.  Stay home from work or school as told by your doctor. Unless you are visiting your doctor, try to avoid leaving home until your fever has been gone for 24 hours without the use of medicine.  Keep all follow-up visits as told by your doctor. This is important. How is this prevented?  Getting a yearly (annual) flu shot is the best way to avoid getting the flu. You may get the flu shot in late summer, fall, or winter. Ask your doctor when you should get your flu shot.  Wash your hands often or use hand sanitizer often.  Avoid contact with people who are sick during cold and flu season.  Eat healthy foods.  Drink plenty of fluids.  Get enough sleep.  Exercise regularly. Contact a doctor if:  You get new symptoms.  You have:  Chest pain.  Watery poop  (diarrhea).  A fever.  Your cough gets worse.  You start to have more mucus.  You feel sick to your stomach (nauseous).  You throw up (vomit). Get help right away if:  You start to be short of breath or have trouble breathing.  Your skin or nails turn a bluish color.  You have very bad pain or stiffness in your neck.  You get a sudden headache.  You get sudden pain in your face or ear.  You cannot stop throwing up. This information is not intended to replace advice given to you by your health care provider. Make sure you discuss any questions you have with your health care provider. Document Released: 07/31/2008 Document Revised: 03/29/2016 Document Reviewed: 08/16/2015 Elsevier Interactive Patient Education  2017 [employer].

## 2016-12-21 NOTE — Telephone Encounter (Signed)
See message below  Please advise

## 2016-12-25 ENCOUNTER — Ambulatory Visit (INDEPENDENT_AMBULATORY_CARE_PROVIDER_SITE_OTHER): Payer: PRIVATE HEALTH INSURANCE | Admitting: Adult Health

## 2016-12-25 ENCOUNTER — Telehealth: Payer: Self-pay | Admitting: Internal Medicine

## 2016-12-25 ENCOUNTER — Emergency Department (HOSPITAL_COMMUNITY)
Admission: EM | Admit: 2016-12-25 | Discharge: 2016-12-25 | Disposition: A | Discharge status: Home / Self Care | Payer: PRIVATE HEALTH INSURANCE | Attending: Emergency Medicine | Admitting: Emergency Medicine

## 2016-12-25 ENCOUNTER — Emergency Department (HOSPITAL_COMMUNITY): Payer: PRIVATE HEALTH INSURANCE

## 2016-12-25 ENCOUNTER — Encounter: Payer: Self-pay | Admitting: Adult Health

## 2016-12-25 ENCOUNTER — Encounter (HOSPITAL_COMMUNITY): Payer: Self-pay

## 2016-12-25 VITALS — BP 150/78 | HR 118 | Temp 98.4°F | Ht 60.0 in | Wt 187.2 lb

## 2016-12-25 DIAGNOSIS — J069 Acute upper respiratory infection, unspecified: Secondary | ICD-10-CM

## 2016-12-25 DIAGNOSIS — J45909 Unspecified asthma, uncomplicated: Secondary | ICD-10-CM | POA: Insufficient documentation

## 2016-12-25 DIAGNOSIS — J029 Acute pharyngitis, unspecified: Secondary | ICD-10-CM | POA: Diagnosis not present

## 2016-12-25 DIAGNOSIS — R6889 Other general symptoms and signs: Secondary | ICD-10-CM

## 2016-12-25 DIAGNOSIS — R112 Nausea with vomiting, unspecified: Secondary | ICD-10-CM | POA: Diagnosis not present

## 2016-12-25 DIAGNOSIS — R05 Cough: Secondary | ICD-10-CM | POA: Diagnosis present

## 2016-12-25 DIAGNOSIS — Z79899 Other long term (current) drug therapy: Secondary | ICD-10-CM | POA: Insufficient documentation

## 2016-12-25 HISTORY — DX: Unspecified osteoarthritis, unspecified site: M19.90

## 2016-12-25 LAB — URINALYSIS, ROUTINE W REFLEX MICROSCOPIC
BILIRUBIN URINE: NEGATIVE
GLUCOSE, UA: NEGATIVE mg/dL
Hgb urine dipstick: NEGATIVE
KETONES UR: NEGATIVE mg/dL
Leukocytes, UA: NEGATIVE
Nitrite: NEGATIVE
PH: 5 (ref 5.0–8.0)
Protein, ur: NEGATIVE mg/dL
Specific Gravity, Urine: 1.02 (ref 1.005–1.030)

## 2016-12-25 LAB — COMPREHENSIVE METABOLIC PANEL
ALK PHOS: 81 U/L (ref 38–126)
ALT: 32 U/L (ref 14–54)
AST: 25 U/L (ref 15–41)
Albumin: 3.7 g/dL (ref 3.5–5.0)
Anion gap: 13 (ref 5–15)
BUN: 9 mg/dL (ref 6–20)
CALCIUM: 9.2 mg/dL (ref 8.9–10.3)
CO2: 23 mmol/L (ref 22–32)
CREATININE: 0.88 mg/dL (ref 0.44–1.00)
Chloride: 105 mmol/L (ref 101–111)
GFR calc Af Amer: >60 mL/min (ref >60)
GFR calc non Af Amer: >60 mL/min (ref >60)
Glucose, Bld: 116 mg/dL — ABNORMAL HIGH (ref 65–99)
Potassium: 3.3 mmol/L — ABNORMAL LOW (ref 3.5–5.1)
SODIUM: 141 mmol/L (ref 135–145)
Total Bilirubin: <0.1 mg/dL — ABNORMAL LOW (ref 0.3–1.2)
Total Protein: 6.5 g/dL (ref 6.5–8.1)

## 2016-12-25 LAB — CBC WITH DIFFERENTIAL/PLATELET
BASOS ABS: 0 10*3/uL (ref 0.0–0.1)
BASOS PCT: 0 %
EOS ABS: 0.2 10*3/uL (ref 0.0–0.7)
Eosinophils Relative: 1 %
HCT: 42.1 % (ref 36.0–46.0)
HEMOGLOBIN: 13.4 g/dL (ref 12.0–15.0)
LYMPHS ABS: 2.4 10*3/uL (ref 0.7–4.0)
Lymphocytes Relative: 19 %
MCH: 27 pg (ref 26.0–34.0)
MCHC: 31.8 g/dL (ref 30.0–36.0)
MCV: 84.9 fL (ref 78.0–100.0)
Monocytes Absolute: 0.9 10*3/uL (ref 0.1–1.0)
Monocytes Relative: 7 %
NEUTROS PCT: 73 %
Neutro Abs: 9.3 10*3/uL — ABNORMAL HIGH (ref 1.7–7.7)
Platelets: 338 10*3/uL (ref 150–400)
RBC: 4.96 MIL/uL (ref 3.87–5.11)
RDW: 14.5 % (ref 11.5–15.5)
WBC: 12.8 10*3/uL — AB (ref 4.0–10.5)

## 2016-12-25 LAB — I-STAT TROPONIN, ED: Troponin i, poc: 0 ng/mL (ref 0.00–0.08)

## 2016-12-25 LAB — D-DIMER, QUANTITATIVE: D-Dimer, Quant: 0.33 ug/mL-FEU (ref 0.00–0.50)

## 2016-12-25 MED ORDER — SODIUM CHLORIDE 0.9 % IV BOLUS (SEPSIS)
1000.0000 mL | Freq: Once | INTRAVENOUS | Status: AC
  Administered 2016-12-25: 1000 mL via INTRAVENOUS

## 2016-12-25 MED ORDER — LEVOFLOXACIN 750 MG PO TABS: 750.0000 mg | ORAL_TABLET | Freq: Every day | ORAL | 0 refills | Status: DC

## 2016-12-25 MED ORDER — ONDANSETRON HCL 4 MG PO TABS: 4.0000 mg | ORAL_TABLET | Freq: Three times a day (TID) | ORAL | 0 refills | Status: DC | PRN

## 2016-12-25 MED ORDER — PREDNISONE 50 MG PO TABS: ORAL_TABLET | ORAL | 0 refills | Status: DC

## 2016-12-25 MED ORDER — ONDANSETRON HCL 4 MG PO TABS: 4.0000 mg | ORAL_TABLET | Freq: Four times a day (QID) | ORAL | 0 refills | Status: DC

## 2016-12-25 MED ORDER — ONDANSETRON HCL 4 MG/2ML IJ SOLN
4.0000 mg | Freq: Once | INTRAMUSCULAR | Status: AC
  Administered 2016-12-25: 4 mg via INTRAMUSCULAR

## 2016-12-25 MED ORDER — CYCLOBENZAPRINE HCL 10 MG PO TABS: 10.0000 mg | ORAL_TABLET | Freq: Two times a day (BID) | ORAL | 0 refills | Status: DC | PRN

## 2016-12-25 MED ORDER — ONDANSETRON HCL 4 MG/2ML IJ SOLN
4.0000 mg | Freq: Once | INTRAMUSCULAR | Status: AC
  Administered 2016-12-25: 4 mg via INTRAVENOUS
  Filled 2016-12-25: qty 2

## 2016-12-25 MED ORDER — HYDROCOD POLST-CPM POLST ER 10-8 MG/5ML PO SUER: 5.0000 mL | Freq: Two times a day (BID) | ORAL | 0 refills | Status: DC | PRN

## 2016-12-25 MED ORDER — SODIUM CHLORIDE 0.9 % IV SOLN
INTRAVENOUS | Status: AC
  Administered 2016-12-25: 19:00:00 via INTRAVENOUS

## 2016-12-25 MED ORDER — METHYLPREDNISOLONE ACETATE 80 MG/ML IJ SUSP
80.0000 mg | Freq: Once | INTRAMUSCULAR | Status: AC
  Administered 2016-12-25: 80 mg via INTRAMUSCULAR

## 2016-12-25 MED ORDER — IPRATROPIUM-ALBUTEROL 0.5-2.5 (3) MG/3ML IN SOLN: 3.0000 mL | Freq: Four times a day (QID) | RESPIRATORY_TRACT | Status: DC

## 2016-12-25 MED ORDER — HYDROCOD POLST-CPM POLST ER 10-8 MG/5ML PO SUER
5.0000 mL | Freq: Once | ORAL | Status: AC
  Administered 2016-12-25: 5 mL via ORAL
  Filled 2016-12-25: qty 5

## 2016-12-25 NOTE — Telephone Encounter (Signed)
FYI  Pt wanted you to know that she is on her way to the ER she is having a hard time to breath.

## 2016-12-25 NOTE — Discharge Instructions (Signed)
Take the medication as directed and follow up with your doctor. Return here as needed for worsening symptoms.  °

## 2016-12-25 NOTE — Progress Notes (Signed)
Subjective:    Patient ID: [name], female    DOB: 04-23-1971, 46 y.o.   MRN: [id number]  HPI  46 year old female who  has a past medical history of Endometriosis. She was seen in the office on 12/21/2016 and diagnosed with influenza, was given a 5 day course of Tamiflu. She presents to the clinic today complaining on no improvement in her symptoms. She reports worsening shortness of breath, fatigue, productive cough,  nausea and diarrhea. She denies fever.   Per patient she has a long history of pneumonia.     Review of Systems  Constitutional: Positive for activity change, appetite change and fatigue. Negative for chills and fever.  HENT: Negative.   Eyes: Negative.   Respiratory: Positive for cough, chest tightness, shortness of breath and wheezing. Negative for apnea, choking and stridor.   Cardiovascular: Negative.   Gastrointestinal: Positive for diarrhea and nausea. Negative for abdominal pain and blood in stool.  Neurological: Positive for weakness.  All other systems reviewed and are negative.   Past Medical History:  Diagnosis Date  . Endometriosis     Social History   Social History  . Marital status: Divorced    Spouse name: N/A  . Number of children: N/A  . Years of education: N/A   Occupational History  . Not on file.   Social History Main Topics  . Smoking status: Never Smoker  . Smokeless tobacco: Never Used  . Alcohol use No  . Drug use: No  . Sexual activity: Yes    Birth control/ protection: Surgical   Other Topics Concern  . Not on file   Social History Narrative  . No narrative on file    Past Surgical History:  Procedure Laterality Date  . ABDOMINAL HYSTERECTOMY      Family History  Problem Relation Age of Onset  . Hypertension Mother   . Cancer Mother 56    "back cancer"  . Other Mother     hysterectomy at 63 for "pre-cancerous cervical cells"  . Hypertension Father   . Colon cancer Father 35  . Cervical  cancer Sister     full sister dx. unspecified age  . Colon polyps Sister     unspecified number  . Breast cancer Maternal Grandmother     unspecified age  . Diabetes Maternal Grandmother   . Diabetes Paternal Grandfather   . Colon cancer Paternal Aunt     (x2) paternal aunts with colon cancer at unspecified ages; lim info  . Colon cancer Paternal Uncle     dx. unspecified age; lim info  . Colon polyps Sister     paternal half-sister w/ colon polyps dx. age 69 or younger - unspecified number  . Colon cancer Sister 7    paternal half-sister   . Colon polyps Sister     paternal half-sister; unspecified number  . Colon cancer Sister     paternal half-sister dx. before age 109; has had two colon cancers  . Breast cancer Sister     paternal half-sister dx under age 76  . Renal cancer Sister 65    paternal half-sister dx. w/ "[facility] - lymphoid renal cancer"  . Cancer Sister 74    paternal half-sister dx cancer of wrist that was not a skin cancer  . Colon polyps Son 51    has had a "bleeding polyp"  . Cervical cancer Other 25    niece; +hysterectomy  . Breast cancer Paternal Aunt     (  x3) paternal aunts with breast cancer at unspecified ages; lim info    Allergies  Allergen Reactions  . Doxycycline Diarrhea  . Penicillins     Current Outpatient Prescriptions on File Prior to Visit  Medication Sig Dispense Refill  . HYDROcodone-homatropine (HYCODAN) 5-1.5 MG/5ML syrup Take 5 mLs by mouth every 6 (six) hours as needed for cough. 120 mL 0  . oseltamivir (TAMIFLU) 75 MG capsule Take 1 capsule (75 mg total) by mouth 2 (two) times daily. 14 capsule 0  . phentermine 37.5 MG capsule Take 1 capsule (37.5 mg total) by mouth every morning. 30 capsule 3   No current facility-administered medications on file prior to visit.     BP (!) 150/78   Pulse (!) 118   Temp 98.4 F (36.9 C) (Oral)   Ht 5' (1.524 m)   Wt 187 lb 3.2 oz (84.9 kg)   SpO2 97%   BMI 36.56 kg/m       Objective:    Physical Exam  Constitutional: She is oriented to person, place, and time. She appears well-developed and well-nourished. She appears ill. No distress.  Cardiovascular: Normal rate, regular rhythm, normal heart sounds and intact distal pulses.  Exam reveals no gallop and no friction rub.   No murmur heard. Pulmonary/Chest: Accessory muscle usage present. No respiratory distress. She has decreased breath sounds in the right lower field, the left middle field and the left lower field. She has wheezes in the right upper field, the right middle field, the right lower field, the left upper field, the left middle field and the left lower field. She has no rhonchi. She has no rales. She exhibits no tenderness.  Tight sounding   Neurological: She is alert and oriented to person, place, and time.  Skin: Skin is warm and dry. No rash noted. She is not diaphoretic. No erythema. No pallor.  Psychiatric: She has a normal mood and affect. Her behavior is normal. Judgment and thought content normal.  Nursing note and vitals reviewed.     Assessment & Plan:  1. Upper respiratory tract infection, unspecified type - I advised that she go to go for further evaluation due to symptoms. She refused at this time. Advised to go to the ER if her symptoms did not improve or if they became worse  - predniSONE (DELTASONE) 50 MG tablet; Take one tablet daily  Dispense: 5 tablet; Refill: 0 - ondansetron (ZOFRAN) 4 MG tablet; Take 1 tablet (4 mg total) by mouth every 8 (eight) hours as needed for nausea or vomiting.  Dispense: 20 tablet; Refill: 0 - levofloxacin (LEVAQUIN) 750 MG tablet; Take 1 tablet (750 mg total) by mouth daily.  Dispense: 7 tablet; Refill: 0 - methylPREDNISolone acetate (DEPO-MEDROL) injection 80 mg; Inject 1 mL (80 mg total) into the muscle once. - ondansetron (ZOFRAN) injection 4 mg; Inject 2 mLs (4 mg total) into the muscle once. - ipratropium-albuterol (DUONEB) 0.5-2.5 (3) MG/3ML nebulizer solution 3  mL; Take 3 mLs by nebulization every 6 (six) hours. - After Duoneb the patient endorsed as though she was able to breath easier. She was still using accessory muscle. Her pulse went down from 120 to 95 after duo neb. Oxygen saturation at 97%. Wheezing had diminished and she sounded less tight.   Dorothyann Peng, NP

## 2016-12-25 NOTE — ED Provider Notes (Signed)
[facility] DEPT Provider Note   CSN: [id number] Arrival date & time: 12/25/16  1554  By signing my name below, I, [name], attest that this documentation has been prepared under the direction and in the presence of Whitney Baller, NP. Electronically Signed: Charolotte [name], Scribe. 12/25/16. 6:07 PM.   History   Chief Complaint Chief Complaint  Patient presents with  . Influenza    HPI [name] is a 46 y.o. female with h/o PNA,  HTN who presents to the Emergency Department complaining of cough producing green chucks that started 2 weeks ago. Pt has associated fever tmax 105, diarrhea, nausea, vomiting. Pt has taken Tamiflu with limited relief due to vomiting the medication. Pt went to PCP today and given 2 injections that patient thinks was a steroid and zofran. She also states she had a breathing treatment. She was told to go to ED for fluids and IV medications. Pt has taken Hydrocodone cough syrup with slight relief. Doxycycline with an allergic reaction hives. Pt had positive sick contact from son and husband.   The history is provided by the patient. No language interpreter was used.    Past Medical History:  Diagnosis Date  . Arthritis   . Endometriosis     Patient Active Problem List   Diagnosis Date Noted  . Genetic testing 04/04/2016  . Family history of colon cancer 03/14/2016  . Family history of breast cancer in female 03/14/2016  . Obesity 01/18/2012  . ANXIETY 04/20/2008  . Asthma 04/20/2008  . LIVER FUNCTION TESTS, ABNORMAL 04/20/2008  . Attention deficit hyperactivity disorder (ADHD) 06/10/2007    Past Surgical History:  Procedure Laterality Date  . ABDOMINAL HYSTERECTOMY      OB History    Gravida Para Term Preterm AB Living   7 3 3   4 3    SAB TAB Ectopic Multiple Live Births   4               Home Medications    Prior to Admission medications   Medication Sig Start Date End Date Taking? Authorizing Provider    chlorpheniramine-HYDROcodone (TUSSIONEX PENNKINETIC ER) 10-8 MG/5ML SUER Take 5 mLs by mouth every 12 (twelve) hours as needed for cough. 12/25/16   Whitney Weichel Bunnie Pion, NP  cyclobenzaprine (FLEXERIL) 10 MG tablet Take 1 tablet (10 mg total) by mouth 2 (two) times daily as needed for muscle spasms. 12/25/16   Whitney Kingma Bunnie Pion, NP  HYDROcodone-homatropine (HYCODAN) 5-1.5 MG/5ML syrup Take 5 mLs by mouth every 6 (six) hours as needed for cough. 12/21/16   Whitney Lor, MD  levofloxacin (LEVAQUIN) 750 MG tablet Take 1 tablet (750 mg total) by mouth daily. 12/25/16   Whitney Peng, NP  ondansetron (ZOFRAN) 4 MG tablet Take 1 tablet (4 mg total) by mouth every 6 (six) hours. 12/25/16   Rigby, NP  oseltamivir (TAMIFLU) 75 MG capsule Take 1 capsule (75 mg total) by mouth 2 (two) times daily. 12/21/16   Whitney Lor, MD  phentermine 37.5 MG capsule Take 1 capsule (37.5 mg total) by mouth every morning. 11/29/14   Whitney Lor, MD  predniSONE (DELTASONE) 50 MG tablet Take one tablet daily 12/25/16   Whitney Peng, NP    Family History Family History  Problem Relation Age of Onset  . Hypertension Mother   . Cancer Mother 31    "back cancer"  . Other Mother     hysterectomy at 47 for "pre-cancerous cervical cells"  .  Hypertension Father   . Colon cancer Father 62  . Cervical cancer Sister     full sister dx. unspecified age  . Colon polyps Sister     unspecified number  . Breast cancer Maternal Grandmother     unspecified age  . Diabetes Maternal Grandmother   . Diabetes Paternal Grandfather   . Colon cancer Paternal Aunt     (x2) paternal aunts with colon cancer at unspecified ages; lim info  . Colon cancer Paternal Uncle     dx. unspecified age; lim info  . Colon polyps Sister     paternal half-sister w/ colon polyps dx. age 57 or younger - unspecified number  . Colon cancer Sister 7    paternal half-sister   . Colon polyps Sister     paternal half-sister; unspecified number   . Colon cancer Sister     paternal half-sister dx. before age 84; has had two colon cancers  . Breast cancer Sister     paternal half-sister dx under age 26  . Renal cancer Sister 88    paternal half-sister dx. w/ "[facility] - lymphoid renal cancer"  . Cancer Sister 53    paternal half-sister dx cancer of wrist that was not a skin cancer  . Colon polyps Son 6    has had a "bleeding polyp"  . Cervical cancer Other 25    niece; +hysterectomy  . Breast cancer Paternal Aunt     (x3) paternal aunts with breast cancer at unspecified ages; lim info    Social History Social History  Substance Use Topics  . Smoking status: Never Smoker  . Smokeless tobacco: Never Used  . Alcohol use No     Allergies   Doxycycline and Penicillins   Review of Systems Review of Systems  Constitutional: Positive for chills and fever.  HENT: Positive for congestion and sore throat. Negative for trouble swallowing.   Eyes: Negative for redness.  Respiratory: Positive for cough and shortness of breath.   Gastrointestinal: Positive for diarrhea, nausea and vomiting. Negative for abdominal pain.  Genitourinary: Negative for difficulty urinating.  Musculoskeletal: Negative for neck stiffness.  Skin: Negative for rash.  Neurological: Positive for headaches.     Physical Exam Updated Vital Signs BP 160/91   Pulse 93   Temp 98.9 F (37.2 C) (Oral)   Resp 12   Ht 5' (1.524 m)   Wt 77.1 kg   SpO2 97%   BMI 33.20 kg/m   Physical Exam  Constitutional: She is oriented to person, place, and time. She appears well-developed and well-nourished. No distress.  HENT:  Head: Normocephalic and atraumatic.  Uvula midline.  Eyes: EOM are normal. Pupils are equal, round, and reactive to light.  Neck: Normal range of motion. Neck supple.  Cardiovascular: Normal rate and regular rhythm.   Pulmonary/Chest: Effort normal. She has no wheezes. She has no rales.  Abdominal: Soft. Bowel sounds are normal. There is no  tenderness.  Musculoskeletal: Normal range of motion.  Lymphadenopathy:    She has no cervical adenopathy.  Neurological: She is alert and oriented to person, place, and time.  Skin: Skin is warm and dry.  Psychiatric: She has a normal mood and affect.  Nursing note and vitals reviewed.    ED Treatments / Results   DIAGNOSTIC STUDIES: Oxygen Saturation is 99% on room air, normal by my interpretation.    COORDINATION OF CARE: 6:06 PM Discussed treatment plan with pt at bedside and pt agreed to plan.  Labs (all labs ordered are listed, but only abnormal results are displayed) Labs Reviewed  CBC WITH DIFFERENTIAL/PLATELET - Abnormal; Notable for the following:       Result Value   WBC 12.8 (*)    Neutro Abs 9.3 (*)    All other components within normal limits  COMPREHENSIVE METABOLIC PANEL - Abnormal; Notable for the following:    Potassium 3.3 (*)    Glucose, Bld 116 (*)    Total Bilirubin <0.1 (*)    All other components within normal limits  URINALYSIS, ROUTINE W REFLEX MICROSCOPIC  D-DIMER, QUANTITATIVE (NOT AT [facility])  I-STAT TROPOININ, ED    EKG  EKG Interpretation  Date/Time:  Tuesday December 25 2016 19:31:59 EST Ventricular Rate:  94 PR Interval:    QRS Duration: 86 QT Interval:  370 QTC Calculation: 463 R Axis:   -32 Text Interpretation:  Sinus rhythm Left axis deviation Abnormal R-wave progression, late transition Borderline abnrm T, anterolateral leads Baseline wander in lead(s) V4 V5 No significant change since last tracing Confirmed by [facility] MD, Junie Panning ([id number]) on 12/25/2016 9:20:58 PM Also confirmed by [facility] MD, ERIN ([id number]), editor [place]  CCT, BEVERLY ([id number])  on 12/26/2016 6:52:55 AM       Radiology Dg Chest 2 View  Result Date: 12/25/2016 CLINICAL DATA:  Cough with fever and body aches. Vomiting for 2 weeks. Recent history of flu and pneumonia. EXAM: CHEST  2 VIEW COMPARISON:  08/01/2005. FINDINGS: The heart size and mediastinal contours  are within normal limits. Both lungs are clear. The visualized skeletal structures are unremarkable. Prior cervical fusion. IMPRESSION: No active cardiopulmonary disease.  Stable exam. Electronically Signed   By: Staci Righter M.D.   On: 12/25/2016 16:36    Procedures Procedures (including critical care time)  Medications Ordered in ED Medications  0.9 %  sodium chloride infusion ( Intravenous Stopped 12/25/16 2203)  sodium chloride 0.9 % bolus 1,000 mL (0 mLs Intravenous Stopped 12/25/16 2312)  ondansetron (ZOFRAN) injection 4 mg (4 mg Intravenous Given 12/25/16 1847)  chlorpheniramine-HYDROcodone (TUSSIONEX) 10-8 MG/5ML suspension 5 mL (5 mLs Oral Given 12/25/16 2216)     Initial Impression / Assessment and Plan / ED Course  I have reviewed the triage vital signs and the nursing notes.  Pertinent labs & imaging results that were available during my care of the patient were reviewed by me and considered in my medical decision making (see chart for details).     Patient with symptoms consistent with influenza.  Vitals are stable.  Tolerating PO's prior to d/c.  Lungs are clear. Due to patient's presentation and physical exam a chest x-ray was ordered and is normal. Patient already taking Tamiflu.  Patient will be discharged with instructions to orally hydrate, rest, and use over-the-counter medications such as anti-inflammatories ibuprofen and Aleve for muscle aches and Tylenol for fever.  Patient will also be given a cough suppressant.   Dr. Billy Fischer in to examine the patient and will add flexeril to her d/c Rx for muscle aches. Stable for d/c. Patient to f/u with her PCP. Return precautions given.  Final Clinical Impressions(s) / ED Diagnoses   Final diagnoses:  Flu-like symptoms    New Prescriptions Discharge Medication List as of 12/25/2016 10:46 PM    START taking these medications   Details  chlorpheniramine-HYDROcodone (TUSSIONEX PENNKINETIC ER) 10-8 MG/5ML SUER Take 5 mLs by  mouth every 12 (twelve) hours as needed for cough., Starting Tue 12/25/2016, Print    cyclobenzaprine (FLEXERIL) 10 MG tablet  Take 1 tablet (10 mg total) by mouth 2 (two) times daily as needed for muscle spasms., Starting Tue 12/25/2016, Print       I personally performed the services described in this documentation, which was scribed in my presence. The recorded information has been reviewed and is accurate.     [address] [place], [place] 12/27/16 [id number]    Gareth Morgan, MD 12/27/16 [phone]

## 2016-12-25 NOTE — ED Notes (Signed)
Mask on patient at triage.

## 2016-12-25 NOTE — Patient Instructions (Signed)
I have sent in a prescription for Levaquin, Zofran, and Prednisone.   Take these as directed.   Go to the ER if you are not feeling any better or your symptoms worsen

## 2016-12-25 NOTE — ED Triage Notes (Addendum)
Pt reports she was sent here to ED by her PCP. She has been diagnosed with the flu last week as well as bronchitis and pneumonia with productive cough of green "chunks." She reports n/v/d as well. She has taken Tamiflu but "it hasnt worked." She reports chest congestion and headache. States her doctor recommends IV fluids for dehydration.

## 2016-12-26 NOTE — Telephone Encounter (Signed)
Noted  

## 2016-12-31 ENCOUNTER — Ambulatory Visit (INDEPENDENT_AMBULATORY_CARE_PROVIDER_SITE_OTHER): Payer: PRIVATE HEALTH INSURANCE | Admitting: Family Medicine

## 2016-12-31 ENCOUNTER — Encounter: Payer: Self-pay | Admitting: Family Medicine

## 2016-12-31 VITALS — BP 124/82 | HR 99 | Temp 98.7°F | Wt 187.2 lb

## 2016-12-31 DIAGNOSIS — R6889 Other general symptoms and signs: Secondary | ICD-10-CM

## 2016-12-31 DIAGNOSIS — R112 Nausea with vomiting, unspecified: Secondary | ICD-10-CM | POA: Diagnosis not present

## 2016-12-31 MED ORDER — PROMETHAZINE HCL 25 MG RE SUPP: 25.0000 mg | Freq: Four times a day (QID) | RECTAL | 0 refills | Status: DC | PRN

## 2016-12-31 NOTE — Progress Notes (Signed)
Subjective:    Patient ID: [name], female    DOB: 09/10/1971, 46 y.o.   MRN: [id number]  HPI  Ms. Marlowe Sax is a 46 year old female who presents today with nausea and vomiting intermittently for 2 weeks.  Associated flu like symptoms were noted and she  was treated in the office on 12/2016 for an URI where she was advised to go to the ED.  On 12/25/16, she was evaluated in the ED with fever, diarrhea, nausea and vomiting. She took Tamiflu however vomited medication and found little benefit.  At PCP office on 2/202/18, she was treated with zofran, steroid, and breathing treatment and advised to go to ED for IV fluids and medications.   At the ED, she was diagnosed with influenza and vitals were stable. She received a chest X-ray and exam which was normal.  She was discharged with supportive treatment.     Today, she reports nausea with vomiting, nasal congestion, and sinus pressure/pain.  She denies fever, chills, sweats, diarrhea, neck pain, and myalgias. She is seeking care today as nausea has persisted and she is unable to take oral zofran that was provided to her in the ED.      Review of Systems  Constitutional: Negative for chills and fever.  HENT: Positive for congestion.   Respiratory: Positive for cough. Negative for shortness of breath and wheezing.   Gastrointestinal: Positive for nausea and vomiting. Negative for abdominal pain and diarrhea.  Genitourinary: Negative for decreased urine volume.  Musculoskeletal: Negative for myalgias.  Neurological: Negative for dizziness, light-headedness and headaches.   Past Medical History:  Diagnosis Date  . Arthritis   . Endometriosis      Social History   Social History  . Marital status: Divorced    Spouse name: N/A  . Number of children: N/A  . Years of education: N/A   Occupational History  . Not on file.   Social History Main Topics  . Smoking status: Never Smoker  . Smokeless tobacco: Never Used  . Alcohol  use No  . Drug use: No  . Sexual activity: Yes    Birth control/ protection: Surgical   Other Topics Concern  . Not on file   Social History Narrative  . No narrative on file    Past Surgical History:  Procedure Laterality Date  . ABDOMINAL HYSTERECTOMY      Family History  Problem Relation Age of Onset  . Hypertension Mother   . Cancer Mother 17    "back cancer"  . Other Mother     hysterectomy at 16 for "pre-cancerous cervical cells"  . Hypertension Father   . Colon cancer Father 42  . Cervical cancer Sister     full sister dx. unspecified age  . Colon polyps Sister     unspecified number  . Breast cancer Maternal Grandmother     unspecified age  . Diabetes Maternal Grandmother   . Diabetes Paternal Grandfather   . Colon cancer Paternal Aunt     (x2) paternal aunts with colon cancer at unspecified ages; lim info  . Colon cancer Paternal Uncle     dx. unspecified age; lim info  . Colon polyps Sister     paternal half-sister w/ colon polyps dx. age 65 or younger - unspecified number  . Colon cancer Sister 7    paternal half-sister   . Colon polyps Sister     paternal half-sister; unspecified number  . Colon cancer Sister  paternal half-sister dx. before age 26; has had two colon cancers  . Breast cancer Sister     paternal half-sister dx under age 35  . Renal cancer Sister 55    paternal half-sister dx. w/ "[facility] - lymphoid renal cancer"  . Cancer Sister 65    paternal half-sister dx cancer of wrist that was not a skin cancer  . Colon polyps Son 52    has had a "bleeding polyp"  . Cervical cancer Other 25    niece; +hysterectomy  . Breast cancer Paternal Aunt     (x3) paternal aunts with breast cancer at unspecified ages; lim info    Allergies  Allergen Reactions  . Doxycycline Diarrhea  . Penicillins     Current Outpatient Prescriptions on File Prior to Visit  Medication Sig Dispense Refill  . chlorpheniramine-HYDROcodone (TUSSIONEX PENNKINETIC  ER) 10-8 MG/5ML SUER Take 5 mLs by mouth every 12 (twelve) hours as needed for cough. 115 mL 0  . cyclobenzaprine (FLEXERIL) 10 MG tablet Take 1 tablet (10 mg total) by mouth 2 (two) times daily as needed for muscle spasms. 20 tablet 0  . HYDROcodone-homatropine (HYCODAN) 5-1.5 MG/5ML syrup Take 5 mLs by mouth every 6 (six) hours as needed for cough. 120 mL 0  . levofloxacin (LEVAQUIN) 750 MG tablet Take 1 tablet (750 mg total) by mouth daily. 7 tablet 0  . ondansetron (ZOFRAN) 4 MG tablet Take 1 tablet (4 mg total) by mouth every 6 (six) hours. 12 tablet 0  . oseltamivir (TAMIFLU) 75 MG capsule Take 1 capsule (75 mg total) by mouth 2 (two) times daily. 14 capsule 0  . phentermine 37.5 MG capsule Take 1 capsule (37.5 mg total) by mouth every morning. 30 capsule 3  . predniSONE (DELTASONE) 50 MG tablet Take one tablet daily 5 tablet 0   Current Facility-Administered Medications on File Prior to Visit  Medication Dose Route Frequency Provider Last Rate Last Dose  . ipratropium-albuterol (DUONEB) 0.5-2.5 (3) MG/3ML nebulizer solution 3 mL  3 mL Nebulization Q6H Cory Nafziger, NP        BP 124/82 (BP Location: Left Arm, Patient Position: Sitting, Cuff Size: Normal)   Pulse 99   Temp 98.7 F (37.1 C) (Oral)   Wt 187 lb 3.2 oz (84.9 kg)   SpO2 96%   BMI 36.56 kg/m        Objective:   Physical Exam  Constitutional: She is oriented to person, place, and time. She appears well-developed.  HENT:  Right Ear: Tympanic membrane normal.  Left Ear: Tympanic membrane normal.  Nose: No rhinorrhea. Right sinus exhibits no maxillary sinus tenderness and no frontal sinus tenderness. Left sinus exhibits no maxillary sinus tenderness and no frontal sinus tenderness.  Mouth/Throat: Mucous membranes are normal. No oropharyngeal exudate or posterior oropharyngeal erythema.  Eyes: Pupils are equal, round, and reactive to light. No scleral icterus.  Neck: Neck supple.  Cardiovascular: Normal rate, regular  rhythm and intact distal pulses.   Pulmonary/Chest: Effort normal and breath sounds normal. She has no wheezes. She has no rales.  Abdominal: Soft. Bowel sounds are normal. There is no tenderness. There is no rebound.  Lymphadenopathy:    She has no cervical adenopathy.  Neurological: She is alert and oriented to person, place, and time. Coordination normal.  Skin: Skin is warm and dry. No rash noted.  Psychiatric: She has a normal mood and affect. Her behavior is normal. Judgment and thought content normal.  Assessment & Plan:  1. Nausea and vomiting, intractability of vomiting not specified, unspecified vomiting type Recent flu symptoms diagnosed with strong exposure history to flu; recent ED treatment with fluids and evaluation with an EKG, chest x-ray and normal physical exam; exam is reassuring today; she is afebrile; nausea and vomiting with increased fluids will be treated with suppository as she reports inability to take oral zofran that was previously prescribed. Further advised her to monitor her urine output and fluid intake as it is important that she remain hydrated. She will seek medical treatment if she is unable to hold down fluids with treatment or urine decreases. She verbalized understanding and agreed with plan. - promethazine (PHENERGAN) 25 MG suppository; Place 1 suppository (25 mg total) rectally every 6 (six) hours as needed for nausea or vomiting.  Dispense: 12 each; Refill: 0   2. Flu-like symptoms Resolving but nausea with vomiting still persists. Advised patient on supportive measures:  Get rest, drink plenty of fluids, and use follow up if fever >101, if symptoms worsen or if symptoms are not improved in 3 days. Patient verbalizes understanding.    Delano Metz, FNP-C

## 2016-12-31 NOTE — Patient Instructions (Signed)
Please take medication as prescribed and focus on increasing fluids and diet slowly as tolerated.  If you are unable to hold fluids down or develop new symptoms, please follow up for evaluation.   Nausea and Vomiting, Adult Introduction Feeling sick to your stomach (nausea) means that your stomach is upset or you feel like you have to throw up (vomit). Feeling more and more sick to your stomach can lead to throwing up. Throwing up happens when food and liquid from your stomach are thrown up and out the mouth. Throwing up can make you feel weak and cause you to get dehydrated. Dehydration can make you tired and thirsty, make you have a dry mouth, and make it so you pee (urinate) less often. Older adults and people with other diseases or a weak defense system (immune system) are at higher risk for dehydration. If you feel sick to your stomach or if you throw up, it is important to follow instructions from your doctor about how to take care of yourself. Follow these instructions at home: Eating and drinking Follow these instructions as told by your doctor:  Take an oral rehydration solution (ORS). This is a drink that is sold at pharmacies and stores.  Drink clear fluids in small amounts as you are able, such as:  Water.  Ice chips.  Diluted fruit juice.  Low-calorie sports drinks.  Eat bland, easy-to-digest foods in small amounts as you are able, such as:  Bananas.  Applesauce.  Rice.  Low-fat (lean) meats.  Toast.  Crackers.  Avoid fluids that have a lot of sugar or caffeine in them.  Avoid alcohol.  Avoid spicy or fatty foods. General instructions  Drink enough fluid to keep your pee (urine) clear or pale yellow.  Wash your hands often. If you cannot use soap and water, use hand sanitizer.  Make sure that all people in your home wash their hands well and often.  Take over-the-counter and prescription medicines only as told by your doctor.  Rest at home while you  get better.  Watch your condition for any changes.  Breathe slowly and deeply when you feel sick to your stomach.  Keep all follow-up visits as told by your doctor. This is important. Contact a doctor if:  You have a fever.  You cannot keep fluids down.  Your symptoms get worse.  You have new symptoms.  You feel sick to your stomach for more than two days.  You feel light-headed or dizzy.  You have a headache.  You have muscle cramps. Get help right away if:  You have pain in your chest, neck, arm, or jaw.  You feel very weak or you pass out (faint).  You throw up again and again.  You see blood in your throw-up.  Your throw-up looks like black coffee grounds.  You have bloody or black poop (stools) or poop that look like tar.  You have a very bad headache, a stiff neck, or both.  You have a rash.  You have very bad pain, cramping, or bloating in your belly (abdomen).  You have trouble breathing.  You are breathing very quickly.  Your heart is beating very quickly.  Your skin feels cold and clammy.  You feel confused.  You have pain when you pee.  You have signs of dehydration, such as:  Dark pee, hardly any pee, or no pee.  Cracked lips.  Dry mouth.  Sunken eyes.  Sleepiness.  Weakness. These symptoms may be an emergency.  Do not wait to see if the symptoms will go away. Get medical help right away. Call your local emergency services (911 in the [place] Do not drive yourself to the hospital.  This information is not intended to replace advice given to you by your health care provider. Make sure you discuss any questions you have with your health care provider. Document Released: 04/09/2008 Document Revised: 05/11/2016 Document Reviewed: 06/28/2015  2017 Elsevier

## 2016-12-31 NOTE — Progress Notes (Signed)
Pre visit review using our clinic review tool, if applicable. No additional management support is needed unless otherwise documented below in the visit note. 

## 2017-01-01 ENCOUNTER — Telehealth: Payer: Self-pay | Admitting: Internal Medicine

## 2017-01-01 NOTE — Telephone Encounter (Signed)
Called back the patient, not able to leave a voice message due to mailbox being full, will trying in the morning.

## 2017-01-01 NOTE — Telephone Encounter (Signed)
Pt saw Gregary Signs yesterday and forgot to get a work note. Pt would like to pick up note on Wednesday stating ok to go back to work on Monday.  Pt states she is still coughing real bad, but was able to rest with the med she prescribed.  Pt states Gregary Signs also advised her to call back today to let us know how she is doing and Tommi Rumps would address any concerns.   Pt hopes someone can write her work note for pick up tomorrow.

## 2017-01-02 NOTE — Telephone Encounter (Signed)
Okay to provide work note. Advise her to follow up by the end of the week if she is not improving or sooner if she is unable to keep fluids down.

## 2017-01-03 NOTE — Telephone Encounter (Signed)
Patient wanted to see Dr Gillermina Phy 01/02/17 to get a Zofran injection, but Doctor Burnice Logan told her that if she was driving  Herself it would be dangerous since Zofran was sedating and he wanted to make sure she was safe. Patient  Was advised to follow up if not feeling better.

## 2017-01-16 ENCOUNTER — Ambulatory Visit: Payer: PRIVATE HEALTH INSURANCE | Admitting: Adult Health

## 2017-01-17 ENCOUNTER — Ambulatory Visit (INDEPENDENT_AMBULATORY_CARE_PROVIDER_SITE_OTHER): Payer: PRIVATE HEALTH INSURANCE | Admitting: Adult Health

## 2017-01-17 ENCOUNTER — Encounter: Payer: Self-pay | Admitting: Adult Health

## 2017-01-17 VITALS — BP 152/80 | Temp 98.0°F | Ht 60.0 in | Wt 192.9 lb

## 2017-01-17 DIAGNOSIS — J45901 Unspecified asthma with (acute) exacerbation: Secondary | ICD-10-CM | POA: Diagnosis not present

## 2017-01-17 DIAGNOSIS — R112 Nausea with vomiting, unspecified: Secondary | ICD-10-CM

## 2017-01-17 LAB — BASIC METABOLIC PANEL
BUN: 11 mg/dL (ref 6–23)
CHLORIDE: 106 meq/L (ref 96–112)
CO2: 27 meq/L (ref 19–32)
Calcium: 8.9 mg/dL (ref 8.4–10.5)
Creatinine, Ser: 0.95 mg/dL (ref 0.40–1.20)
GFR: 66.71 mL/min (ref >60.00)
GLUCOSE: 94 mg/dL (ref 70–99)
POTASSIUM: 4 meq/L (ref 3.5–5.1)
Sodium: 141 mEq/L (ref 135–145)

## 2017-01-17 LAB — CBC WITH DIFFERENTIAL/PLATELET
BASOS PCT: 0.4 % (ref 0.0–3.0)
Basophils Absolute: 0 10*3/uL (ref 0.0–0.1)
Eosinophils Absolute: 0.7 10*3/uL (ref 0.0–0.7)
Eosinophils Relative: 8.9 % — ABNORMAL HIGH (ref 0.0–5.0)
HEMATOCRIT: 41.8 % (ref 36.0–46.0)
Hemoglobin: 13.3 g/dL (ref 12.0–15.0)
LYMPHS PCT: 21.9 % (ref 12.0–46.0)
Lymphs Abs: 1.8 10*3/uL (ref 0.7–4.0)
MCHC: 31.9 g/dL (ref 30.0–36.0)
MCV: 85.4 fl (ref 78.0–100.0)
MONOS PCT: 11.2 % (ref 3.0–12.0)
Monocytes Absolute: 0.9 10*3/uL (ref 0.1–1.0)
NEUTROS ABS: 4.8 10*3/uL (ref 1.4–7.7)
Neutrophils Relative %: 57.6 % (ref 43.0–77.0)
PLATELETS: 351 10*3/uL (ref 150.0–400.0)
RBC: 4.89 Mil/uL (ref 3.87–5.11)
RDW: 15.6 % — AB (ref 11.5–15.5)
WBC: 8.3 10*3/uL (ref 4.0–10.5)

## 2017-01-17 MED ORDER — HYDROCODONE-HOMATROPINE 5-1.5 MG/5ML PO SYRP: 5.0000 mL | ORAL_SOLUTION | Freq: Three times a day (TID) | ORAL | 0 refills | Status: DC | PRN

## 2017-01-17 MED ORDER — METHYLPREDNISOLONE ACETATE 80 MG/ML IJ SUSP
80.0000 mg | Freq: Once | INTRAMUSCULAR | Status: AC
  Administered 2017-01-17: 80 mg via INTRAMUSCULAR

## 2017-01-17 MED ORDER — PROMETHAZINE HCL 25 MG RE SUPP: 25.0000 mg | Freq: Four times a day (QID) | RECTAL | 0 refills | Status: DC | PRN

## 2017-01-17 MED ORDER — IPRATROPIUM-ALBUTEROL 0.5-2.5 (3) MG/3ML IN SOLN
3.0000 mL | Freq: Once | RESPIRATORY_TRACT | Status: AC
  Administered 2017-01-17: 3 mL via RESPIRATORY_TRACT

## 2017-01-17 MED ORDER — AZITHROMYCIN 250 MG PO TABS: ORAL_TABLET | ORAL | 0 refills | Status: DC

## 2017-01-17 NOTE — Addendum Note (Signed)
Addended by: Sandria Bales B on: 01/17/2017 11:33 AM   Modules accepted: Orders

## 2017-01-17 NOTE — Progress Notes (Signed)
Subjective:    Patient ID: [name], female    DOB: [birth year], 46 y.o.   MRN: [id number]  HPI  46 year old female, patient of Dr. Raliegh Ip who presents to the office today with the complaint of shortness of breath, dry cough, and nausea and vomiting. She was seen for these same issues by this writeron 12/25/2016 and was advised to go the ER, which she did. She was diagnosed with influenza. Her chest x ray was normal. She was again seen in the office by Almira Coaster on 12/31/2016 and was given a prescription for Phenergan suppositories as she was unable to keep down oral Zofran.   She reports " I was good for three days and then my granddaughter got me sick again."   She has been vomiting and has been short of breath with a dry cough for the last 24 hours. She has been using cough syrup but has run out of her prescription. She has also been using phenergan suppositories to help with nausea and vomiting.   She denies any fevers  She was referred to pulmonology by Dr. Raliegh Ip but has not heard from them about making an appointment. Per appointment notes, she was called but did not answer and she does not have her voice mail set up. The referral has been closed.    Review of Systems  Constitutional: Positive for activity change.  HENT: Positive for postnasal drip, rhinorrhea and sore throat.   Respiratory: Positive for cough, chest tightness, shortness of breath and wheezing.   Cardiovascular: Negative.   Gastrointestinal: Negative.   All other systems reviewed and are negative.  Past Medical History:  Diagnosis Date  . Arthritis   . Endometriosis     Social History   Social History  . Marital status: Divorced    Spouse name: N/A  . Number of children: N/A  . Years of education: N/A   Occupational History  . Not on file.   Social History Main Topics  . Smoking status: Never Smoker  . Smokeless tobacco: Never Used  . Alcohol use No  . Drug use: No  . Sexual activity:  Yes    Birth control/ protection: Surgical   Other Topics Concern  . Not on file   Social History Narrative  . No narrative on file    Past Surgical History:  Procedure Laterality Date  . ABDOMINAL HYSTERECTOMY      Family History  Problem Relation Age of Onset  . Hypertension Mother   . Cancer Mother 60    "back cancer"  . Other Mother     hysterectomy at 25 for "pre-cancerous cervical cells"  . Hypertension Father   . Colon cancer Father 75  . Cervical cancer Sister     full sister dx. unspecified age  . Colon polyps Sister     unspecified number  . Breast cancer Maternal Grandmother     unspecified age  . Diabetes Maternal Grandmother   . Diabetes Paternal Grandfather   . Colon cancer Paternal Aunt     (x2) paternal aunts with colon cancer at unspecified ages; lim info  . Colon cancer Paternal Uncle     dx. unspecified age; lim info  . Colon polyps Sister     paternal half-sister w/ colon polyps dx. age 59 or younger - unspecified number  . Colon cancer Sister 7    paternal half-sister   . Colon polyps Sister     paternal half-sister; unspecified number  .  Colon cancer Sister     paternal half-sister dx. before age 58; has had two colon cancers  . Breast cancer Sister     paternal half-sister dx under age 57  . Renal cancer Sister 33    paternal half-sister dx. w/ "[facility] - lymphoid renal cancer"  . Cancer Sister 45    paternal half-sister dx cancer of wrist that was not a skin cancer  . Colon polyps Son 57    has had a "bleeding polyp"  . Cervical cancer Other 25    niece; +hysterectomy  . Breast cancer Paternal Aunt     (x3) paternal aunts with breast cancer at unspecified ages; lim info    Allergies  Allergen Reactions  . Doxycycline Diarrhea  . Penicillins     Current Outpatient Prescriptions on File Prior to Visit  Medication Sig Dispense Refill  . cyclobenzaprine (FLEXERIL) 10 MG tablet Take 1 tablet (10 mg total) by mouth 2 (two) times daily as  needed for muscle spasms. 20 tablet 0  . levofloxacin (LEVAQUIN) 750 MG tablet Take 1 tablet (750 mg total) by mouth daily. 7 tablet 0  . ondansetron (ZOFRAN) 4 MG tablet Take 1 tablet (4 mg total) by mouth every 6 (six) hours. 12 tablet 0  . oseltamivir (TAMIFLU) 75 MG capsule Take 1 capsule (75 mg total) by mouth 2 (two) times daily. 14 capsule 0  . predniSONE (DELTASONE) 50 MG tablet Take one tablet daily 5 tablet 0  . promethazine (PHENERGAN) 25 MG suppository Place 1 suppository (25 mg total) rectally every 6 (six) hours as needed for nausea or vomiting. 12 each 0   Current Facility-Administered Medications on File Prior to Visit  Medication Dose Route Frequency Provider Last Rate Last Dose  . ipratropium-albuterol (DUONEB) 0.5-2.5 (3) MG/3ML nebulizer solution 3 mL  3 mL Nebulization Q6H Calogero Geisen, NP        BP (!) 152/80 (BP Location: Right Arm, Patient Position: Sitting, Cuff Size: Normal)   Temp 98 F (36.7 C) (Oral)   Ht 5' (1.524 m)   Wt 192 lb 14.4 oz (87.5 kg)   BMI 37.67 kg/m       Objective:   Physical Exam  Constitutional: She is oriented to person, place, and time. She appears well-developed and well-nourished. No distress.  HENT:  Head: Normocephalic and atraumatic.  Right Ear: External ear normal.  Left Ear: External ear normal.  Nose: Nose normal.  Mouth/Throat: Oropharynx is clear and moist. No oropharyngeal exudate.  Eyes: Conjunctivae and EOM are normal. Pupils are equal, round, and reactive to light. Right eye exhibits no discharge. Left eye exhibits no discharge. No scleral icterus.  Neck: Normal range of motion. Neck supple. No thyromegaly present.  Cardiovascular: Normal rate, regular rhythm, normal heart sounds and intact distal pulses.  Exam reveals no gallop and no friction rub.   No murmur heard. Pulmonary/Chest: Effort normal. Tachypnea noted. No respiratory distress. She has wheezes (wheezes throughout. Tight sounding ). She has no rhonchi. She  has no rales. She exhibits no tenderness.  Abdominal: Soft. Bowel sounds are normal. She exhibits no distension and no mass. There is no tenderness. There is no rebound and no guarding.  Lymphadenopathy:    She has no cervical adenopathy.  Neurological: She is alert and oriented to person, place, and time.  Skin: Skin is warm and dry. No rash noted. She is not diaphoretic. No erythema. No pallor.  Psychiatric: She has a normal mood and affect. Her behavior is  normal. Judgment and thought content normal.  Nursing note and vitals reviewed.     Assessment & Plan:  1. Moderate asthma with exacerbation, unspecified whether persistent  - ipratropium-albuterol (DUONEB) 0.5-2.5 (3) MG/3ML nebulizer solution 3 mL; Take 3 mLs by nebulization once. - azithromycin (ZITHROMAX Z-PAK) 250 MG tablet; Take 2 tablets on Day 1.  Then take 1 tablet daily.  Dispense: 6 tablet; Refill: 0 - HYDROcodone-homatropine (HYCODAN) 5-1.5 MG/5ML syrup; Take 5 mLs by mouth every 8 (eight) hours as needed for cough.  Dispense: 120 mL; Refill: 0 - methylPREDNISolone acetate (DEPO-MEDROL) injection 80 mg; Inject 1 mL (80 mg total) into the muscle once. - Ambulatory referral to Pulmonology - Breathing improved after duoneb. She reported being able to breath easier. Wheezing still present but reduces.  - Follow up as needed. Go to the ER with worsening symptoms  2. Nausea and vomiting, intractability of vomiting not specified, unspecified vomiting type - New script for phenergan suppositories given  - CBC with Differential/Platelet - Basic metabolic panel; Future   Dorothyann Peng, NP

## 2017-02-18 ENCOUNTER — Ambulatory Visit (INDEPENDENT_AMBULATORY_CARE_PROVIDER_SITE_OTHER): Payer: PRIVATE HEALTH INSURANCE | Admitting: Internal Medicine

## 2017-02-18 ENCOUNTER — Encounter: Payer: Self-pay | Admitting: Internal Medicine

## 2017-02-18 VITALS — BP 136/82 | HR 112 | Ht 60.0 in | Wt 193.4 lb

## 2017-02-18 DIAGNOSIS — R911 Solitary pulmonary nodule: Secondary | ICD-10-CM

## 2017-02-18 DIAGNOSIS — J45991 Cough variant asthma: Secondary | ICD-10-CM | POA: Diagnosis not present

## 2017-02-18 LAB — NITRIC OXIDE: NITRIC OXIDE: 8

## 2017-02-18 MED ORDER — FAMOTIDINE 20 MG PO TABS: ORAL_TABLET | ORAL | 2 refills | Status: DC

## 2017-02-18 MED ORDER — PANTOPRAZOLE SODIUM 40 MG PO TBEC: 40.0000 mg | DELAYED_RELEASE_TABLET | Freq: Every day | ORAL | 2 refills | Status: DC

## 2017-02-18 NOTE — Assessment & Plan Note (Addendum)
CT chest 10/20/16 wnl   There is apparently a subsequent ct taken while acutely ill with "pna/flu" (which she did not provide) that shows an abnormality but she did not provide it - given she's a never smoker and had nl ct 10/20/16 likely any change with acute illness would be inflammatory and would not necessitate f/u but would instead follow fleischner society guidelines/ advised

## 2017-02-18 NOTE — Assessment & Plan Note (Signed)
Body mass index is 37.77 kg/m.  -  trending up Lab Results  Component Value Date   TSH 0.40 03/05/2013     Contributing to gerd risk/ doe/reviewed the need and the process to achieve and maintain neg calorie balance > defer f/u primary care including intermittently monitoring thyroid status

## 2017-02-18 NOTE — Assessment & Plan Note (Addendum)
02/18/2017  Walked RA x 3 laps @ 185 ft each stopped due to  End of study, fast pace, no  desat   - sob at end  After ex Spirometry 02/18/2017  s any obstruction at all while symptomatic - FENO 02/18/2017  =   8 while symptomatic and no recent steroids of any kind - 02/18/2017  After extensive coaching HFA effectiveness =    75% from baseline < 25%      Symptoms are markedly disproportionate to objective findings and not clear this is a lung problem but pt does appear to have difficult airway management issues. DDX of  difficult airways management almost all start with A and  include Adherence, Ace Inhibitors, Acid Reflux, Active Sinus Disease, Alpha 1 Antitripsin deficiency, Anxiety masquerading as Airways dz,  ABPA,  Allergy(esp in young), Aspiration (esp in elderly), Adverse effects of meds,  Active smokers, A bunch of PE's (a small clot burden can't cause this syndrome unless there is already severe underlying pulm or vascular dz with poor reserve) plus two Bs  = Bronchiectasis and Beta blocker use..and one C= CHF   Adherence is always the initial "prime suspect" and is a multilayered concern that requires a "trust but verify" approach in every patient - starting with knowing how to use medications, especially inhalers, correctly, keeping up with refills and understanding the fundamental difference between maintenance and prns vs those medications only taken for a very short course and then stopped and not refilled.  - see hfa teaching   ? Acid (or non-acid) GERD > always difficult to exclude as up to 75% of pts in some series report no assoc GI/ Heartburn symptoms> rec max (24h)  acid suppression and diet restrictions/ reviewed and instructions given in writing.   ? Anxiety > usually at the bottom of this list of usual suspects but should be much higher on this pt's based on H and P and note has worked in [employer] which is a common feature for pseudasthma/ vcd  ? Allergy/ asthma very  unlikely at present given she's symptomatic currently and yet  no evidence of eos inflammation or airways obst so should use saba sparingly when can't otherwise catch her breath   ? A bunch of PE's > certainly at risk given obesity but chronology back to age 46 does not support this   rec reconditioning/reduce saba if possible  f/u @ 4 weeks with cpst   Total time devoted to counseling  > 50 % of initial 60 min office visit:  review case with pt/ discussion of options/alternatives/ personally creating written customized instructions  in presence of pt  then going over those specific  Instructions directly with the pt including how to use all of the meds but in particular covering each new medication in detail and the difference between the maintenance= "automatic" meds and the prns using an action plan format for the latter (If this problem/symptom => do that organization reading Left to right).  Please see AVS from this visit for a full list of these instructions which I personally wrote for this pt and  are unique to this visit.

## 2017-02-18 NOTE — Patient Instructions (Addendum)
Only use your albuterol as a rescue medication to be used if you can't catch your breath by resting or doing a relaxed purse lip breathing pattern.  - The less you use it, the better it will work when you need it. - Ok to use up to 2 puffs  every 4 hours if you must but call for immediate appointment if use goes up over your usual need - Don't leave home without it !!  (think of it like the spare tire for your car)    Pantoprazole (protonix) 40 mg   Take  30-60 min before first meal of the day and Pepcid (famotidine)  20 mg one @  bedtime until return to office - this is the best way to tell whether stomach acid is contributing to your problem.    GERD (REFLUX)  is an extremely common cause of respiratory symptoms just like yours , many times with no obvious heartburn at all.    It can be treated with medication, but also with lifestyle changes including elevation of the head of your bed (ideally with 6 inch  bed blocks),  Smoking cessation, avoidance of late meals, excessive alcohol, and avoid fatty foods, chocolate, peppermint, colas, red wine, and acidic juices such as orange juice.  NO MINT OR MENTHOL PRODUCTS SO NO COUGH DROPS   USE SUGARLESS CANDY INSTEAD (Jolley ranchers or Stover's or Life Savers) or even ice chips will also do - the key is to swallow to prevent all throat clearing. NO OIL BASED VITAMINS - use powdered substitutes.   Weight control is simply a matter of calorie balance which needs to be tilted in your favor by eating less and exercising more.  To get the most out of exercise, you need to be continuously aware that you are short of breath, but never out of breath, for 30 minutes daily. As you improve, it will actually be easier for you to do the same amount of exercise  in  30 minutes so always push to the level where you are short of breath.  If this does not result in gradual weight reduction then I strongly recommend you see a nutritionist with a food diary x 2 weeks so  that we can work out a negative calorie balance which is universally effective in steady weight loss programs.  Think of your calorie balance like you do your bank account where in this case you want the balance to go down so you must take in less calories than you burn up.  It's just that simple:  Hard to do, but easy to understand.  Good luck!    If not improving after a month please call so schedule CPST by calling [facility] at [phone]

## 2017-02-18 NOTE — Progress Notes (Signed)
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 Pulmonary Consult    Referred by Dorothyann Peng. Pt states she was dxed with Asthma in her 20's. She c/o increased SOB for the past 9 months.  She states she gets SOB walking short distances such as from parking lot to our building today.   last pregnancy age 49  baby born premturely @  49 months but did fine and she did fine with breathing @ term but says sob since age 7  Baseline wt around 58 at age 46  Prednisone doesn't really help any more, not sure it ever did  avg day = today used saba 9 h prior to OV and sob across the room    Sleeps 30 degrees better p melatonin   No obvious day to day or daytime variability or assoc excess/ purulent sputum or mucus plugs or hemoptysis or cp or chest tightness, subjective wheeze or overt sinus or hb symptoms. No unusual exp hx or h/o childhood pna/ asthma or knowledge of premature birth.  Sleeping ok without nocturnal  or early am exacerbation  of respiratory  c/o's or need for noct saba. Also denies any obvious fluctuation of symptoms with weather or environmental changes or other aggravating or alleviating factors except as outlined above   Current Medications, Allergies, Complete Past Medical History, Past Surgical History, Family History, and Social History were reviewed in [facility] record.  ROS  The following are not active complaints unless bolded sore throat, dysphagia, dental problems, itching, sneezing,  nasal congestion or  excess/ purulent secretions, ear ache,   fever, chills, sweats, unintended wt loss, classically pleuritic or exertional cp,  orthopnea pnd or leg swelling, presyncope, palpitations, abdominal pain, anorexia, nausea, vomiting, diarrhea  or change in bowel or bladder habits, change in stools or urine, dysuria,hematuria,  rash, arthralgias, visual complaints, headache, numbness, weakness or ataxia or problems with walking or coordination,  change in mood/affect or memory.          Review of Systems     Objective:   Physical Exam    amb wf nad   Wt Readings from Last 3 Encounters:  02/18/17 193 lb 6.4 oz (87.7 kg)  01/17/17 192 lb 14.4 oz (87.5 kg)  12/31/16 187 lb 3.2 oz (84.9 kg)    Vital signs reviewed   - Note on arrival 02 sats  96% on RA     HEENT: nl dentition, turbinates bilaterally, and oropharynx. Nl external ear canals without cough reflex   NECK :  without JVD/Nodes/TM/ nl carotid upstrokes bilaterally   LUNGS: no acc muscle use,  Nl contour chest which is clear to A and P bilaterally without cough on insp or exp maneuvers   CV:  RRR  no s3 or murmur or increase in P2, and no edema   ABD:  Obese but soft and nontender with nl inspiratory excursion in the supine position. No bruits or  organomegaly appreciated, bowel sounds nl  MS:  Nl gait/ ext warm without deformities, calf tenderness, cyanosis or clubbing No obvious joint restrictions   SKIN: warm and dry without lesions    NEURO:  alert, approp, nl sensorium with  no motor or cerebellar deficits apparent.                 Assessment:

## 2017-03-05 ENCOUNTER — Telehealth: Payer: Self-pay | Admitting: Gastroenterology

## 2017-03-05 ENCOUNTER — Other Ambulatory Visit (HOSPITAL_COMMUNITY): Payer: Self-pay | Admitting: Urology

## 2017-03-05 DIAGNOSIS — N1339 Other hydronephrosis: Secondary | ICD-10-CM

## 2017-03-05 NOTE — Telephone Encounter (Signed)
She needs upper eus radial +/- linear next Thursday (5/10) with MAC sedation for abnormal stomach.  thanks

## 2017-03-05 NOTE — Telephone Encounter (Signed)
Patient notified that records have arrived. I advised her that I will pass them along to Dr. Ardis Hughs to review.

## 2017-03-05 NOTE — Telephone Encounter (Signed)
Patient reports a "mass on my stomach" found on CT.  She was told that we would be doing an EUS on her.  Patient says that Dr. Karsten Ro from [facility] is supposed to be sending the referral.  Discussed with Dr. Ardis Hughs he has not received a referral.  Patient notified that referral is not here.  She will contact Dr. Simone Curia office to send the referral.  She is extremely anxious.

## 2017-03-06 NOTE — Telephone Encounter (Signed)
Pt has been scheduled for EUS on 03/14/17 [facility] 1030 am

## 2017-03-06 NOTE — Telephone Encounter (Signed)
EUS scheduled, pt instructed and medications reviewed.  Patient instructions mailed to home.  Patient to call with any questions or concerns.  

## 2017-03-13 ENCOUNTER — Ambulatory Visit (HOSPITAL_COMMUNITY)
Admission: RE | Admit: 2017-03-13 | Discharge: 2017-03-13 | Disposition: A | Discharge status: Home / Self Care | Payer: PRIVATE HEALTH INSURANCE | Source: Ambulatory Visit | Attending: Urology | Admitting: Urology

## 2017-03-13 ENCOUNTER — Encounter (HOSPITAL_COMMUNITY): Payer: Self-pay | Admitting: *Deleted

## 2017-03-13 ENCOUNTER — Telehealth: Payer: Self-pay | Admitting: Gastroenterology

## 2017-03-13 DIAGNOSIS — N270 Small kidney, unilateral: Secondary | ICD-10-CM | POA: Insufficient documentation

## 2017-03-13 DIAGNOSIS — N1339 Other hydronephrosis: Secondary | ICD-10-CM

## 2017-03-13 MED ORDER — FUROSEMIDE 10 MG/ML IJ SOLN
44.0000 mg | Freq: Once | INTRAMUSCULAR | Status: AC
  Administered 2017-03-13: 44 mg via INTRAVENOUS

## 2017-03-13 MED ORDER — TECHNETIUM TC 99M MERTIATIDE
4.8000 | Freq: Once | INTRAVENOUS | Status: AC | PRN
  Administered 2017-03-13: 4.8 via INTRAVENOUS

## 2017-03-13 MED ORDER — FUROSEMIDE 10 MG/ML IJ SOLN
INTRAMUSCULAR | Status: AC
  Filled 2017-03-13: qty 8

## 2017-03-13 NOTE — Telephone Encounter (Signed)
Attempted to return call but received message that voicemail box has not been set up yet, unable to reach. Will try again.

## 2017-03-13 NOTE — Telephone Encounter (Signed)
Pt has been advised to arrive at 9 am

## 2017-03-14 ENCOUNTER — Ambulatory Visit (HOSPITAL_COMMUNITY): Payer: PRIVATE HEALTH INSURANCE | Admitting: Anesthesiology

## 2017-03-14 ENCOUNTER — Encounter (HOSPITAL_COMMUNITY): Payer: Self-pay

## 2017-03-14 ENCOUNTER — Ambulatory Visit (HOSPITAL_COMMUNITY)
Admission: RE | Admit: 2017-03-14 | Discharge: 2017-03-14 | Disposition: A | Discharge status: Home / Self Care | Payer: PRIVATE HEALTH INSURANCE | Source: Ambulatory Visit | Attending: Gastroenterology | Admitting: Gastroenterology

## 2017-03-14 ENCOUNTER — Encounter (HOSPITAL_COMMUNITY)
Admission: RE | Disposition: A | Discharge status: Home / Self Care | Payer: Self-pay | Source: Ambulatory Visit | Attending: Gastroenterology

## 2017-03-14 DIAGNOSIS — R1310 Dysphagia, unspecified: Secondary | ICD-10-CM | POA: Diagnosis present

## 2017-03-14 DIAGNOSIS — Z809 Family history of malignant neoplasm, unspecified: Secondary | ICD-10-CM | POA: Insufficient documentation

## 2017-03-14 DIAGNOSIS — J45909 Unspecified asthma, uncomplicated: Secondary | ICD-10-CM | POA: Insufficient documentation

## 2017-03-14 DIAGNOSIS — N1330 Unspecified hydronephrosis: Secondary | ICD-10-CM | POA: Diagnosis not present

## 2017-03-14 DIAGNOSIS — K3189 Other diseases of stomach and duodenum: Secondary | ICD-10-CM | POA: Diagnosis not present

## 2017-03-14 DIAGNOSIS — R933 Abnormal findings on diagnostic imaging of other parts of digestive tract: Secondary | ICD-10-CM | POA: Diagnosis not present

## 2017-03-14 HISTORY — DX: Adverse effect of unspecified anesthetic, initial encounter: T41.45XA

## 2017-03-14 HISTORY — PX: EUS: SHX5427

## 2017-03-14 HISTORY — DX: Other complications of anesthesia, initial encounter: T88.59XA

## 2017-03-14 SURGERY — UPPER ENDOSCOPIC ULTRASOUND (EUS) LINEAR
Anesthesia: Monitor Anesthesia Care

## 2017-03-14 MED ORDER — LACTATED RINGERS IV SOLN
INTRAVENOUS | Status: DC | PRN
  Administered 2017-03-14: 10:00:00 via INTRAVENOUS

## 2017-03-14 MED ORDER — PROPOFOL 10 MG/ML IV BOLUS
INTRAVENOUS | Status: AC
  Filled 2017-03-14: qty 20

## 2017-03-14 MED ORDER — PROPOFOL 10 MG/ML IV BOLUS
INTRAVENOUS | Status: AC
  Filled 2017-03-14: qty 40

## 2017-03-14 MED ORDER — PROPOFOL 500 MG/50ML IV EMUL
INTRAVENOUS | Status: DC | PRN
  Administered 2017-03-14 (×3): 10 mg via INTRAVENOUS
  Administered 2017-03-14: 50 mg via INTRAVENOUS
  Administered 2017-03-14: 10 mg via INTRAVENOUS
  Administered 2017-03-14: 60 mg via INTRAVENOUS

## 2017-03-14 MED ORDER — PROPOFOL 500 MG/50ML IV EMUL
INTRAVENOUS | Status: DC | PRN
  Administered 2017-03-14: 100 ug/kg/min via INTRAVENOUS

## 2017-03-14 NOTE — H&P (View-Only) (Signed)
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 Pulmonary Consult    Referred by Dorothyann Peng. Pt states she was dxed with Asthma in her 20's. She c/o increased SOB for the past 9 months.  She states she gets SOB walking short distances such as from parking lot to our building today.   last pregnancy age 46  baby born premturely @  27 months but did fine and she did fine with breathing @ term but says sob since age 84  Baseline wt around 35 at age 20  Prednisone doesn't really help any more, not sure it ever did  avg day = today used saba 9 h prior to OV and sob across the room    Sleeps 30 degrees better p melatonin   No obvious day to day or daytime variability or assoc excess/ purulent sputum or mucus plugs or hemoptysis or cp or chest tightness, subjective wheeze or overt sinus or hb symptoms. No unusual exp hx or h/o childhood pna/ asthma or knowledge of premature birth.  Sleeping ok without nocturnal  or early am exacerbation  of respiratory  c/o's or need for noct saba. Also denies any obvious fluctuation of symptoms with weather or environmental changes or other aggravating or alleviating factors except as outlined above   Current Medications, Allergies, Complete Past Medical History, Past Surgical History, Family History, and Social History were reviewed in [employer] record.  ROS  The following are not active complaints unless bolded sore throat, dysphagia, dental problems, itching, sneezing,  nasal congestion or  excess/ purulent secretions, ear ache,   fever, chills, sweats, unintended wt loss, classically pleuritic or exertional cp,  orthopnea pnd or leg swelling, presyncope, palpitations, abdominal pain, anorexia, nausea, vomiting, diarrhea  or change in bowel or bladder habits, change in stools or urine, dysuria,hematuria,  rash, arthralgias, visual complaints, headache, numbness, weakness or ataxia or problems with walking or coordination,  change in mood/affect or memory.          Review of Systems     Objective:   Physical Exam    amb wf nad   Wt Readings from Last 3 Encounters:  02/18/17 193 lb 6.4 oz (87.7 kg)  01/17/17 192 lb 14.4 oz (87.5 kg)  12/31/16 187 lb 3.2 oz (84.9 kg)    Vital signs reviewed   - Note on arrival 02 sats  96% on RA     HEENT: nl dentition, turbinates bilaterally, and oropharynx. Nl external ear canals without cough reflex   NECK :  without JVD/Nodes/TM/ nl carotid upstrokes bilaterally   LUNGS: no acc muscle use,  Nl contour chest which is clear to A and P bilaterally without cough on insp or exp maneuvers   CV:  RRR  no s3 or murmur or increase in P2, and no edema   ABD:  Obese but soft and nontender with nl inspiratory excursion in the supine position. No bruits or  organomegaly appreciated, bowel sounds nl  MS:  Nl gait/ ext warm without deformities, calf tenderness, cyanosis or clubbing No obvious joint restrictions   SKIN: warm and dry without lesions    NEURO:  alert, approp, nl sensorium with  no motor or cerebellar deficits apparent.                 Assessment:

## 2017-03-14 NOTE — Op Note (Signed)
[facility] Patient Name: [name] Procedure Date: 03/14/2017 MRN: [id number] Attending MD: Milus Banister , MD Date of Birth: 16-Feb-1971 CSN: [id number] Age: 46 Admit Type: Outpatient Procedure:                Upper EUS Indications:              Hyperenhancing lesion noted on recent CT scan on                            stomach wall, also right sided hydronephrosis and                            obstructing lesion in pelvis; history of                            endometriosis and extensive FH of cancer Providers:                Milus Banister, MD, Burtis Junes, RN, Lillie Fragmin, RN, Cherylynn Ridges, Technician, Alan Mulder, Technician, Rosario Adie, CRNA Referring MD:             Dr. Kathie Rhodes Medicines:                Monitored Anesthesia Care Complications:            No immediate complications. Estimated blood loss:                            None. Estimated Blood Loss:     Estimated blood loss: none. Procedure:                Pre-Anesthesia Assessment:                           - Prior to the procedure, a History and Physical                            was performed, and patient medications and                            allergies were reviewed. The patient's tolerance of                            previous anesthesia was also reviewed. The risks                            and benefits of the procedure and the sedation                            options and risks were discussed with the patient.  All questions were answered, and informed consent                            was obtained. Prior Anticoagulants: The patient has                            taken no previous anticoagulant or antiplatelet                            agents. ASA Grade Assessment: II - A patient with                            mild systemic disease. After reviewing the risks           and benefits, the patient was deemed in                            satisfactory condition to undergo the procedure.                           After obtaining informed consent, the endoscope was                            passed under direct vision. Throughout the                            procedure, the patient's blood pressure, pulse, and                            oxygen saturations were monitored continuously. The                            was introduced through the mouth, and advanced to                            the antrum of the stomach. The [id number] ([id number])                            scope was introduced through the mouth, and                            advanced to the antrum of the stomach. The upper                            EUS was accomplished without difficulty. The                            patient tolerated the procedure well. Scope In: Scope Out: Findings:      Endoscopic Finding :      The examined esophagus was endoscopically normal.      The entire examined stomach was endoscopically normal.      The examined duodenum was endoscopically normal.      Endosonographic Finding :      1. There was a heterogeneous, predominantly hyperechoic soft  tissue       lesion abutting the posterior gastric wall. The lesion is not mucosal,       rather it appears to communicate with serosa of the stomach and it       measures 2.2cm maximally. Fine needle aspiration for cytology was       performed. Color Doppler imaging was utilized prior to needle puncture       to confirm a lack of significant vascular structures within the needle       path. Two passes were made with the 25 gauge needle using a transgastric       approach. A stylet was used. A cytotechnologist was present to evaluate       the adequacy of the specimen. Final cytology results are pending.      2. Limited views of liver, spleen, pancreas were all normal. Impression:               - UGI tract is normal  endoscopically (no mucosal                            lesions)                           - 2.2cm soft tissue lesion along the posterior wall                            of the stomach that appears to involve the serosa                            of the gastric wall, externally. This was sampled                            with FNA. Cytology results pending. Moderate Sedation:      N/A- Per Anesthesia Care Recommendation:           - Discharge patient to home (ambulatory).                           - Await pathology results. Procedure Code(s):        --- Professional ---                           [phone], 77, Esophagogastroduodenoscopy, flexible,                            transoral; with transendoscopic ultrasound-guided                            intramural or transmural fine needle                            aspiration/biopsy(s), (includes endoscopic                            ultrasound examination limited to the esophagus,                            stomach or duodenum, and adjacent structures)  Diagnosis Code(s):        --- Professional ---                           K31.89, Other diseases of stomach and duodenum                           R93.3, Abnormal findings on diagnostic imaging of                            other parts of digestive tract CPT copyright 2016 American Medical Association. All rights reserved. The codes documented in this report are preliminary and upon coder review may  be revised to meet current compliance requirements. Milus Banister, MD 03/14/2017 11:15:15 AM This report has been signed electronically. Number of Addenda: 0

## 2017-03-14 NOTE — Transfer of Care (Signed)
Immediate Anesthesia Transfer of Care Note  Patient: [name]  Procedure(s) Performed: Procedure(s): UPPER ENDOSCOPIC ULTRASOUND (EUS) LINEAR (N/A)  Patient Location: PACU  Anesthesia Type:MAC  Level of Consciousness: awake, alert  and oriented  Airway & Oxygen Therapy: Patient Spontanous Breathing  Post-op Assessment: Report given to RN  Post vital signs: Reviewed and stable  Last Vitals:  Vitals:   03/14/17 0942  BP: (!) 146/85  Pulse: 80  Resp: 19  Temp: 36.8 C    Last Pain:  Vitals:   03/14/17 0942  TempSrc: Oral         Complications: No apparent anesthesia complications

## 2017-03-14 NOTE — Anesthesia Preprocedure Evaluation (Addendum)
Anesthesia Evaluation  Patient identified by MRN, date of birth, ID band Patient awake    Reviewed: Allergy & Precautions, NPO status , Patient's Chart, lab work & pertinent test results  Airway Mallampati: II  TM Distance: >3 FB Neck ROM: Full    Dental no notable dental hx.    Pulmonary neg pulmonary ROS,    Pulmonary exam normal breath sounds clear to auscultation       Cardiovascular negative cardio ROS Normal cardiovascular exam Rhythm:Regular Rate:Normal     Neuro/Psych negative neurological ROS  negative psych ROS   GI/Hepatic negative GI ROS, Neg liver ROS,   Endo/Other  negative endocrine ROS  Renal/GU negative Renal ROS  negative genitourinary   Musculoskeletal negative musculoskeletal ROS (+)   Abdominal   Peds negative pediatric ROS (+)  Hematology negative hematology ROS (+)   Anesthesia Other Findings   Reproductive/Obstetrics negative OB ROS                             Anesthesia Physical Anesthesia Plan  ASA: II  Anesthesia Plan: MAC   Post-op Pain Management:    Induction: Intravenous  Airway Management Planned: Nasal Cannula  Additional Equipment:   Intra-op Plan:   Post-operative Plan:   Informed Consent: I have reviewed the patients History and Physical, chart, labs and discussed the procedure including the risks, benefits and alternatives for the proposed anesthesia with the patient or authorized representative who has indicated his/her understanding and acceptance.   Dental advisory given  Plan Discussed with: CRNA and Surgeon  Anesthesia Plan Comments:         Anesthesia Quick Evaluation

## 2017-03-14 NOTE — Interval H&P Note (Signed)
History and Physical Interval Note:  03/14/2017 9:35 AM     Found to have lesion along wall of posterior stomach on recent CT scan.  Known endometriosis. Also right hydro with possible endometrioma causing ureteral obstruction. Here for EUS evaluation of the gastric lesion.    [name]  has presented today for surgery, with the diagnosis of abn stomach  The various methods of treatment have been discussed with the patient and family. After consideration of risks, benefits and other options for treatment, the patient has consented to  Procedure(s): UPPER ENDOSCOPIC ULTRASOUND (EUS) LINEAR (N/A) as a surgical intervention .  The patient's history has been reviewed, patient examined, no change in status, stable for surgery.  I have reviewed the patient's chart and labs.  Questions were answered to the patient's satisfaction.     Milus Banister

## 2017-03-14 NOTE — Discharge Instructions (Signed)

## 2017-03-15 ENCOUNTER — Encounter (HOSPITAL_COMMUNITY): Payer: Self-pay | Admitting: Gastroenterology

## 2017-03-15 NOTE — Anesthesia Postprocedure Evaluation (Signed)
Anesthesia Post Note  Patient: [name]  Procedure(s) Performed: Procedure(s) (LRB): UPPER ENDOSCOPIC ULTRASOUND (EUS) LINEAR (N/A)  Patient location during evaluation: PACU Anesthesia Type: MAC Level of consciousness: awake and alert Pain management: pain level controlled Vital Signs Assessment: post-procedure vital signs reviewed and stable Respiratory status: spontaneous breathing, nonlabored ventilation, respiratory function stable and patient connected to nasal cannula oxygen Cardiovascular status: stable and blood pressure returned to baseline Anesthetic complications: no       Last Vitals:  Vitals:   03/14/17 1125 03/14/17 1130  BP:  128/90  Pulse: 74 77  Resp: 14 15  Temp:      Last Pain:  Vitals:   03/14/17 1100  TempSrc: Oral                 [name] S

## 2017-03-27 ENCOUNTER — Telehealth: Payer: Self-pay | Admitting: Internal Medicine

## 2017-03-27 NOTE — Telephone Encounter (Signed)
Pt is getting ready to have one of her kidney removed due to mass and cyst. Pt has [facility]. Pt would like to see nutrition due to must start taken better care of herself

## 2017-03-28 NOTE — Telephone Encounter (Signed)
Please see message below, please advise 

## 2017-03-28 NOTE — Telephone Encounter (Signed)
Okay for nutritional consult

## 2017-04-03 ENCOUNTER — Other Ambulatory Visit: Payer: Self-pay | Admitting: Internal Medicine

## 2017-04-03 DIAGNOSIS — Z794 Long term (current) use of insulin: Principal | ICD-10-CM

## 2017-04-03 DIAGNOSIS — E119 Type 2 diabetes mellitus without complications: Secondary | ICD-10-CM

## 2017-04-03 DIAGNOSIS — Z905 Acquired absence of kidney: Secondary | ICD-10-CM

## 2017-04-05 ENCOUNTER — Encounter: Payer: Self-pay | Admitting: Dietician

## 2017-04-05 ENCOUNTER — Encounter: Payer: PRIVATE HEALTH INSURANCE | Attending: Internal Medicine | Admitting: Dietician

## 2017-04-05 DIAGNOSIS — E119 Type 2 diabetes mellitus without complications: Secondary | ICD-10-CM | POA: Diagnosis not present

## 2017-04-05 DIAGNOSIS — Z794 Long term (current) use of insulin: Secondary | ICD-10-CM | POA: Insufficient documentation

## 2017-04-05 DIAGNOSIS — Z713 Dietary counseling and surveillance: Secondary | ICD-10-CM | POA: Diagnosis not present

## 2017-04-05 NOTE — Patient Instructions (Signed)
Aim for good nutrition.  Breakfast, Lunch, Dinner daily.  Snack when you are hungry. Healthy eating for life. Whole foods.  Less processed. No nitrates (avoid processed meat-bacon, sausage, hot dogs, lunch meat etc.) Avoid grilled/charred meat.  Grill vegetables on the grill instead. Limit added fat.  Increase your vegetable intake.  Consider looking at the pcrm.com (physician's committee for responsible  medicine.)

## 2017-04-05 NOTE — Progress Notes (Signed)
  Medical Nutrition Therapy:  Appt start time: 1300 end time:  2023.   Assessment:  Primary concerns today: Patient here today to learn to eat healthfully and lose weight.  UBW 100-110 lbs 5 years ago.  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  Biopsy could not tell if it was cancer at this point.  She wants to proactive and learn what she needs to do now. Weight today 190 lbs decreased from 199 lbs in the past month by stopping regular soda use.  GFR 66.   Patient lives with her husband and son (she has 4 children). She used to be a Electrical engineer at [employer]. She is currently not working.  She used to run up to 5 hours per day until 1 year ago.  She is growing a garden.  Preferred Learning Style:   No preference indicated   Learning Readiness:   Ready  Change in progress   MEDICATIONS: see list   DIETARY INTAKE:  Usual eating pattern includes 2 meals and 1 snacks per day.  Everyday foods include Monk fruit sweetener.  Avoided foods include bread (usually), no fried foods, seafood (dislikes), take out and other fast food other than [employer], soda, limits sweets.  Was drinking 2 six packs of Dr. Malachi Bonds per day prior to 1 month ago.  24-hr recall:  B ( AM): just a banana today OR yogurt OR chips OR rare cereal OR egg  Snk ( AM): none  L ( PM): skips at times OR vegetables and meat Snk ( PM): none D ( PM): Meat, vegetables, and potatoes, occasional tortillas or chips and salsa OR spaghetti OR olive Garden Snk ( PM): piece of chocolate or bite of ice cream or a few chips Beverages: water, cucumber/lime water, occasional lemonade  Usual physical activity: increased cleaning, gardening, walking   Progress Towards Goal(s):  In progress.   Nutritional Diagnosis:  NB-1.1 Food and nutrition-related knowledge deficit As related to healthy eating pre and post op and for  lifestyle changes to promote weight loss..  As evidenced by diet hx and patient report.    Intervention:  Nutrition counseling/education related to healthy nutrition pre and post op.  Encouraged increased plant based, whole foods.  Aim for good nutrition.  Breakfast, Lunch, Dinner daily.  Snack when you are hungry. Healthy eating for life. Whole foods.  Less processed. No nitrates (avoid processed meat-bacon, sausage, hot dogs, lunch meat etc.) Avoid grilled/charred meat.  Grill vegetables on the grill instead. Limit added fat.  Increase your vegetable intake.  Consider looking at the pcrm.com (physician's committee for responsible  medicine.)  Teaching Method Utilized:  Visual Auditory Hands on  Handouts given during visit include:  Meal plan card  Barriers to learning/adherence to lifestyle change: none  Demonstrated degree of understanding via:  Teach Back   Monitoring/Evaluation:  Dietary intake, exercise, and body weight prn.

## 2017-04-08 NOTE — Addendum Note (Signed)
Addendum  created 04/08/17 1432 by Blessing Ozga, MD   Sign clinical note    

## 2017-04-08 NOTE — Anesthesia Postprocedure Evaluation (Signed)
Anesthesia Post Note  Patient: [name]  Procedure(s) Performed: Procedure(s) (LRB): UPPER ENDOSCOPIC ULTRASOUND (EUS) LINEAR (N/A)     Anesthesia Post Evaluation  Last Vitals:  Vitals:   03/14/17 1125 03/14/17 1130  BP:  128/90  Pulse: 74 77  Resp: 14 15  Temp:      Last Pain:  Vitals:   03/18/17 1512  TempSrc:   PainSc: 0-No pain                 Sherian Valenza S

## 2017-04-17 ENCOUNTER — Other Ambulatory Visit: Payer: Self-pay | Admitting: Urology

## 2017-04-17 ENCOUNTER — Ambulatory Visit: Payer: Self-pay | Admitting: General Surgery

## 2017-04-17 NOTE — H&P (Signed)
[name] 04/12/2017 9:17 AM Location: [facility] Patient #: [id number] DOB: Mar 25, 1971 Married / Language: English / Race: White Female   History of Present Illness Whitney Hollingshead MD; 04/15/2017 9:20 AM) The patient is a 46 year old female.  Note:She is referred by Dr. Louis [name] for consultation regarding an incidentally found enhancing mass on the lesser curvature of the gastric wall. She was having right flank and CVA pain. A CT scan demonstrated right hydronephrosis with an atrophic right kidney. There appeared to be a lesion near the distal right ureter causing the obstruction. She was also noted to have a enhancing lesion of the lesser curvature gastric wall. She underwent upper EUS which demonstrated a soft tissue lesion that was not mucosal but rather involved the serosa and measured 2.2 cm. FNA was performed whch demonstrated benign gastric cells. [name] is planning on a right nephrectomy and inspection of the area in the pelvis leading to the ureteral obstruction. She does have a history of endometriosis.  Past Surgical History Whitney Lorenzo, LPN; 03/09/7321 0:25 AM) Cesarean Section - Multiple  Colon Polyp Removal - Colonoscopy  Gallbladder Surgery - Laparoscopic  Hysterectomy (due to cancer) - Partial  Hysterectomy (not due to cancer) - Partial  Tonsillectomy   Diagnostic Studies History Whitney Lorenzo, LPN; 02/04/7061 3:76 AM) Colonoscopy  5-10 years ago Mammogram  within last year Pap Smear  1-5 years ago  Allergies Whitney Lorenzo, LPN; 12/13/3149 7:61 AM) Penicillins  Keflex *CEPHALOSPORINS*  Doxycycline *DERMATOLOGICALS*  Allergies Reconciled   Medication History Whitney Lorenzo, LPN; 6/0/7371 0:62 AM) Juice Plus Fibre (Oral) Active. Biotin Plus Keratin (10000-100MCG-MG Tablet, Oral) Active.  Social History Whitney Lorenzo, LPN; 04/13/4853 6:27 AM) No alcohol use  No caffeine use  No drug use  Tobacco use  Never  smoker.  Family History Whitney Lorenzo, LPN; 0/01/5008 3:81 AM) Alcohol Abuse  Brother, Family Members In General, Mother. Breast Cancer  Sister. Cancer  Family Members In General, Mother. Cerebrovascular Accident  Mother. Cervical Cancer  Mother. Colon Cancer  Father. Colon Polyps  Family Members In General, Father, Sister, Son. Diabetes Mellitus  Family Members In General. Heart Disease  Family Members In General. Heart disease in female family member before age 102  Hypertension  Brother, Mother, Son. Kidney Disease  Sister. Ovarian Cancer  Mother, Sister. Respiratory Condition  Mother. Thyroid problems  Sister.  Pregnancy / Birth History Whitney Lorenzo, LPN; 06/06/9936 1:69 AM) Age at menarche  61 years. Gravida  6 Length (months) of breastfeeding  3-6 Maternal age  33-20 Para  3  Other Problems Whitney Lorenzo, LPN; 04/11/8937 1:01 AM) Asthma  Cholelithiasis  Chronic Renal Failure Syndrome  Gastroesophageal Reflux Disease     Review of Systems Whitney Billings Dockery LPN; 05/09/1024 8:52 AM) General Present- Weight Gain and Weight Loss. Not Present- Appetite Loss, Chills, Fatigue, Fever and Night Sweats. Skin Not Present- Change in Wart/Mole, Dryness, Hives, Jaundice, New Lesions, Non-Healing Wounds, Rash and Ulcer. HEENT Not Present- Earache, Hearing Loss, Hoarseness, Nose Bleed, Oral Ulcers, Ringing in the Ears, Seasonal Allergies, Sinus Pain, Sore Throat, Visual Disturbances, Wears glasses/contact lenses and Yellow Eyes. Respiratory Not Present- Bloody sputum, Chronic Cough, Difficulty Breathing, Snoring and Wheezing. Breast Not Present- Breast Mass, Breast Pain, Nipple Discharge and Skin Changes. Cardiovascular Not Present- Chest Pain, Difficulty Breathing Lying Down, Leg Cramps, Palpitations, Rapid Heart Rate, Shortness of Breath and Swelling of Extremities. Gastrointestinal Present- Abdominal Pain and Bloating. Not Present- Bloody Stool, Change in Bowel  Habits, Chronic diarrhea, Constipation,  Difficulty Swallowing, Excessive gas, Gets full quickly at meals, Hemorrhoids, Indigestion, Nausea, Rectal Pain and Vomiting. Female Genitourinary Present- Frequency and Nocturia. Not Present- Painful Urination, Pelvic Pain and Urgency. Musculoskeletal Present- Back Pain. Not Present- Joint Pain, Joint Stiffness, Muscle Pain, Muscle Weakness and Swelling of Extremities. Neurological Not Present- Decreased Memory, Fainting, Headaches, Numbness, Seizures, Tingling, Tremor, Trouble walking and Weakness. Psychiatric Not Present- Anxiety, Bipolar, Change in Sleep Pattern, Depression, Fearful and Frequent crying. Endocrine Present- Hair Changes. Not Present- Cold Intolerance, Excessive Hunger, Heat Intolerance, Hot flashes and New Diabetes. Hematology Not Present- Blood Thinners, Easy Bruising, Excessive bleeding, Gland problems, HIV and Persistent Infections.  Vitals Whitney Billings Dockery LPN; 06/11/5796 2:82 AM) 04/12/2017 9:24 AM Weight: 191 lb Height: 60in Body Surface Area: 1.83 m Body Mass Index: 37.3 kg/m  Temp.: 98.49F(Oral)  Pulse: 115 (Regular)  P.OX: 97% (Room air) BP: 142/101 (Sitting, Left Arm, Standard)       Physical Exam Whitney Hollingshead MD; 04/15/2017 9:13 AM) The physical exam findings are as follows: Note:GENERAL APPEARANCE: Obese female in NAD. Pleasant and cooperative.  EARS, NOSE, MOUTH THROAT: [facility]/AT external ears: no lesions or deformities external nose: no lesions or deformities hearing: grossly normal lips: moist, no deformities EYES external: conjunctiva, lids, sclerae normal pupils: equal, round  NECK: Supple, no obvious mass or thyroid mass/enlargement, no trachea deviation, scar on lower right side  CV ascultation: RRR, no murmur extremity edema: no extremity varicosities: no  RESP auscultation: breath sounds equal and clear respiratory effort: normal  GASTROINTESTINAL abdomen: Soft, non-tender,  non-distended, no masses liver and spleen: not enlarged. 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 I have explained the procedure and risks. Risks include but are not limited to bleeding, infection, wound problems, anesthesia, anastomotic leak, need for reoperative surgery, injury to intraabominal organs (such as intestine, spleen, kidney etc.), ileus, irregular bowel habits. She seems to understand and agrees with the plan. Will coordinate with Dr. Louis [name].  Jackolyn Confer, M.D.

## 2017-05-07 ENCOUNTER — Other Ambulatory Visit: Payer: Self-pay | Admitting: Urology

## 2017-05-07 NOTE — Progress Notes (Signed)
12-25-16 (EPIC) EKG, CXR

## 2017-05-07 NOTE — Patient Instructions (Signed)
[place]  05/07/2017   Your procedure is scheduled on: 05-17-17   Report to [facility]  Entrance Take [place]  elevators to 3rd floor to [place] at 5:30 AM.    Call this number if you have problems the morning of surgery [phone]    Remember: ONLY 1 PERSON MAY GO WITH YOU TO SHORT STAY TO GET  READY MORNING OF [place].  Do not eat food or drink liquids :After Midnight.     Take these medicines the morning of surgery with A SIP OF WATER: None.                                You may not have any metal on your body including hair pins and              piercings  Do not wear jewelry, make-up, lotions, powders or perfumes, deodorant             Do not wear nail polish.  Do not shave  48 hours prior to surgery.                Do not bring valuables to the hospital. [place].  Contacts, dentures or bridgework may not be worn into surgery.  Leave suitcase in the car. After surgery it may be brought to your room.                Please read over the following fact sheets you were given: _____________________________________________________________________             [facility] - Preparing for Surgery Before surgery, you can play an important role.  Because skin is not sterile, your skin needs to be as free of germs as possible.  You can reduce the number of germs on your skin by washing with CHG (chlorahexidine gluconate) soap before surgery.  CHG is an antiseptic cleaner which kills germs and bonds with the skin to continue killing germs even after washing. Please DO NOT use if you have an allergy to CHG or antibacterial soaps.  If your skin becomes reddened/irritated stop using the CHG and inform your nurse when you arrive at Short Stay. Do not shave (including legs and underarms) for at least 48 hours prior to the first CHG shower.  You may shave your face/neck. Please follow  these instructions carefully:  1.  Shower with CHG Soap the night before surgery and the  morning of Surgery.  2.  If you choose to wash your hair, wash your hair first as usual with your  normal  shampoo.  3.  After you shampoo, rinse your hair and body thoroughly to remove the  shampoo.                           4.  Use CHG as you would any other liquid soap.  You can apply chg directly  to the skin and wash                       Gently with a scrungie or clean washcloth.  5.  Apply the CHG Soap to your body ONLY  FROM THE NECK DOWN.   Do not use on face/ open                           Wound or open sores. Avoid contact with eyes, ears mouth and genitals (private parts).                       Wash face,  Genitals (private parts) with your normal soap.             6.  Wash thoroughly, paying special attention to the area where your surgery  will be performed.  7.  Thoroughly rinse your body with warm water from the neck down.  8.  DO NOT shower/wash with your normal soap after using and rinsing off  the CHG Soap.                9.  Pat yourself dry with a clean towel.            10.  Wear clean pajamas.            11.  Place clean sheets on your bed the night of your first shower and do not  sleep with pets. Day of Surgery : Do not apply any lotions/deodorants the morning of surgery.  Please wear clean clothes to the hospital/surgery center.  FAILURE TO FOLLOW THESE INSTRUCTIONS MAY RESULT IN THE CANCELLATION OF YOUR SURGERY PATIENT SIGNATURE_________________________________  NURSE SIGNATURE__________________________________  ________________________________________________________________________

## 2017-05-14 ENCOUNTER — Encounter (HOSPITAL_COMMUNITY): Payer: Self-pay

## 2017-05-14 ENCOUNTER — Encounter (HOSPITAL_COMMUNITY)
Admission: RE | Admit: 2017-05-14 | Discharge: 2017-05-14 | Disposition: A | Discharge status: Home / Self Care | Payer: PRIVATE HEALTH INSURANCE | Source: Ambulatory Visit | Attending: General Surgery | Admitting: General Surgery

## 2017-05-14 DIAGNOSIS — K319 Disease of stomach and duodenum, unspecified: Secondary | ICD-10-CM | POA: Insufficient documentation

## 2017-05-14 DIAGNOSIS — N289 Disorder of kidney and ureter, unspecified: Secondary | ICD-10-CM | POA: Insufficient documentation

## 2017-05-14 DIAGNOSIS — Z01812 Encounter for preprocedural laboratory examination: Secondary | ICD-10-CM

## 2017-05-14 DIAGNOSIS — Z0183 Encounter for blood typing: Secondary | ICD-10-CM | POA: Insufficient documentation

## 2017-05-14 LAB — CBC WITH DIFFERENTIAL/PLATELET
Basophils Absolute: 0 10*3/uL (ref 0.0–0.1)
Basophils Relative: 0 %
EOS ABS: 0.3 10*3/uL (ref 0.0–0.7)
EOS PCT: 3 %
HCT: 42.6 % (ref 36.0–46.0)
HEMOGLOBIN: 13.6 g/dL (ref 12.0–15.0)
LYMPHS ABS: 1.7 10*3/uL (ref 0.7–4.0)
Lymphocytes Relative: 14 %
MCH: 27.1 pg (ref 26.0–34.0)
MCHC: 31.9 g/dL (ref 30.0–36.0)
MCV: 84.9 fL (ref 78.0–100.0)
MONO ABS: 0.9 10*3/uL (ref 0.1–1.0)
MONOS PCT: 7 %
NEUTROS PCT: 76 %
Neutro Abs: 9.3 10*3/uL — ABNORMAL HIGH (ref 1.7–7.7)
Platelets: 378 10*3/uL (ref 150–400)
RBC: 5.02 MIL/uL (ref 3.87–5.11)
RDW: 13.8 % (ref 11.5–15.5)
WBC: 12.2 10*3/uL — ABNORMAL HIGH (ref 4.0–10.5)

## 2017-05-14 LAB — ABO/RH: ABO/RH(D): A POS

## 2017-05-14 LAB — COMPREHENSIVE METABOLIC PANEL
ALK PHOS: 76 U/L (ref 38–126)
ALT: 31 U/L (ref 14–54)
ANION GAP: 8 (ref 5–15)
AST: 22 U/L (ref 15–41)
Albumin: 4.1 g/dL (ref 3.5–5.0)
BUN: 14 mg/dL (ref 6–20)
CALCIUM: 9.2 mg/dL (ref 8.9–10.3)
CO2: 26 mmol/L (ref 22–32)
Chloride: 103 mmol/L (ref 101–111)
Creatinine, Ser: 0.87 mg/dL (ref 0.44–1.00)
GFR calc non Af Amer: >60 mL/min (ref >60)
Glucose, Bld: 94 mg/dL (ref 65–99)
Potassium: 4.2 mmol/L (ref 3.5–5.1)
SODIUM: 137 mmol/L (ref 135–145)
TOTAL PROTEIN: 7.1 g/dL (ref 6.5–8.1)
Total Bilirubin: 0.3 mg/dL (ref 0.3–1.2)

## 2017-05-17 ENCOUNTER — Inpatient Hospital Stay (HOSPITAL_COMMUNITY): Payer: PRIVATE HEALTH INSURANCE | Admitting: Certified Registered Nurse Anesthetist

## 2017-05-17 ENCOUNTER — Encounter (HOSPITAL_COMMUNITY): Payer: Self-pay

## 2017-05-17 ENCOUNTER — Encounter (HOSPITAL_COMMUNITY)
Admission: RE | Disposition: A | Discharge status: Home / Self Care | Payer: Self-pay | Source: Ambulatory Visit | Attending: General Surgery

## 2017-05-17 ENCOUNTER — Inpatient Hospital Stay (HOSPITAL_COMMUNITY)
Admission: RE | Admit: 2017-05-17 | Discharge: 2017-05-24 | DRG: 982 | Disposition: A | Discharge status: Home / Self Care | Payer: PRIVATE HEALTH INSURANCE | Source: Ambulatory Visit | Attending: General Surgery | Admitting: General Surgery

## 2017-05-17 DIAGNOSIS — B999 Unspecified infectious disease: Secondary | ICD-10-CM

## 2017-05-17 DIAGNOSIS — Z90710 Acquired absence of both cervix and uterus: Secondary | ICD-10-CM

## 2017-05-17 DIAGNOSIS — E876 Hypokalemia: Secondary | ICD-10-CM | POA: Diagnosis not present

## 2017-05-17 DIAGNOSIS — N136 Pyonephrosis: Secondary | ICD-10-CM | POA: Diagnosis not present

## 2017-05-17 DIAGNOSIS — K567 Ileus, unspecified: Secondary | ICD-10-CM | POA: Diagnosis not present

## 2017-05-17 DIAGNOSIS — F411 Generalized anxiety disorder: Secondary | ICD-10-CM | POA: Diagnosis present

## 2017-05-17 DIAGNOSIS — N261 Atrophy of kidney (terminal): Secondary | ICD-10-CM

## 2017-05-17 DIAGNOSIS — D131 Benign neoplasm of stomach: Principal | ICD-10-CM | POA: Diagnosis present

## 2017-05-17 DIAGNOSIS — N803 Endometriosis of pelvic peritoneum: Secondary | ICD-10-CM | POA: Diagnosis present

## 2017-05-17 DIAGNOSIS — T4145XA Adverse effect of unspecified anesthetic, initial encounter: Secondary | ICD-10-CM | POA: Diagnosis present

## 2017-05-17 DIAGNOSIS — D490 Neoplasm of unspecified behavior of digestive system: Secondary | ICD-10-CM | POA: Diagnosis present

## 2017-05-17 DIAGNOSIS — N2889 Other specified disorders of kidney and ureter: Secondary | ICD-10-CM

## 2017-05-17 DIAGNOSIS — J45909 Unspecified asthma, uncomplicated: Secondary | ICD-10-CM | POA: Diagnosis present

## 2017-05-17 DIAGNOSIS — N1330 Unspecified hydronephrosis: Secondary | ICD-10-CM | POA: Diagnosis present

## 2017-05-17 DIAGNOSIS — K219 Gastro-esophageal reflux disease without esophagitis: Secondary | ICD-10-CM | POA: Diagnosis present

## 2017-05-17 DIAGNOSIS — M1990 Unspecified osteoarthritis, unspecified site: Secondary | ICD-10-CM | POA: Diagnosis present

## 2017-05-17 DIAGNOSIS — Z800 Family history of malignant neoplasm of digestive organs: Secondary | ICD-10-CM | POA: Diagnosis not present

## 2017-05-17 DIAGNOSIS — J069 Acute upper respiratory infection, unspecified: Secondary | ICD-10-CM

## 2017-05-17 DIAGNOSIS — T8859XA Other complications of anesthesia, initial encounter: Secondary | ICD-10-CM | POA: Diagnosis present

## 2017-05-17 DIAGNOSIS — F909 Attention-deficit hyperactivity disorder, unspecified type: Secondary | ICD-10-CM | POA: Diagnosis present

## 2017-05-17 HISTORY — PX: LAPAROSCOPIC ABDOMINAL EXPLORATION: SHX6249

## 2017-05-17 HISTORY — PX: LAPAROSCOPIC NEPHRECTOMY: SHX1930

## 2017-05-17 LAB — TYPE AND SCREEN
ABO/RH(D): A POS
ANTIBODY SCREEN: NEGATIVE

## 2017-05-17 SURGERY — EXPLORATION, ABDOMEN, LAPAROSCOPIC
Anesthesia: General | Laterality: Right

## 2017-05-17 MED ORDER — CHLORHEXIDINE GLUCONATE CLOTH 2 % EX PADS: 6.0000 | MEDICATED_PAD | Freq: Once | CUTANEOUS | Status: DC

## 2017-05-17 MED ORDER — SODIUM CHLORIDE 0.9 % IV SOLN
INTRAVENOUS | Status: DC
  Administered 2017-05-17 – 2017-05-19 (×4): via INTRAVENOUS

## 2017-05-17 MED ORDER — FENTANYL CITRATE (PF) 250 MCG/5ML IJ SOLN
INTRAMUSCULAR | Status: AC
  Filled 2017-05-17: qty 5

## 2017-05-17 MED ORDER — LACTATED RINGERS IV SOLN
INTRAVENOUS | Status: DC | PRN
  Administered 2017-05-17 (×3): via INTRAVENOUS

## 2017-05-17 MED ORDER — LABETALOL HCL 5 MG/ML IV SOLN
INTRAVENOUS | Status: DC | PRN
  Administered 2017-05-17 (×5): 2.5 mg via INTRAVENOUS

## 2017-05-17 MED ORDER — PANTOPRAZOLE SODIUM 40 MG PO TBEC: 40.0000 mg | DELAYED_RELEASE_TABLET | Freq: Every day | ORAL | 1 refills | Status: DC

## 2017-05-17 MED ORDER — OXYCODONE HCL 5 MG PO TABS: 5.0000 mg | ORAL_TABLET | ORAL | 0 refills | Status: DC | PRN

## 2017-05-17 MED ORDER — PROMETHAZINE HCL 25 MG/ML IJ SOLN
6.2500 mg | INTRAMUSCULAR | Status: AC | PRN
  Administered 2017-05-17 (×2): 6.25 mg via INTRAVENOUS

## 2017-05-17 MED ORDER — BUPIVACAINE HCL (PF) 0.5 % IJ SOLN
INTRAMUSCULAR | Status: AC
  Filled 2017-05-17: qty 30

## 2017-05-17 MED ORDER — ESMOLOL HCL 100 MG/10ML IV SOLN
INTRAVENOUS | Status: DC | PRN
  Administered 2017-05-17 (×2): 20 mg via INTRAVENOUS

## 2017-05-17 MED ORDER — PANTOPRAZOLE SODIUM 40 MG IV SOLR
40.0000 mg | Freq: Every day | INTRAVENOUS | Status: DC
  Administered 2017-05-17: 40 mg via INTRAVENOUS
  Filled 2017-05-17: qty 40

## 2017-05-17 MED ORDER — ONDANSETRON HCL 4 MG/2ML IJ SOLN
INTRAMUSCULAR | Status: DC | PRN
  Administered 2017-05-17: 4 mg via INTRAVENOUS

## 2017-05-17 MED ORDER — STERILE WATER FOR IRRIGATION IR SOLN
Status: DC | PRN
  Administered 2017-05-17: 1000 mL

## 2017-05-17 MED ORDER — SUCCINYLCHOLINE CHLORIDE 200 MG/10ML IV SOSY
PREFILLED_SYRINGE | INTRAVENOUS | Status: DC | PRN
  Administered 2017-05-17: 100 mg via INTRAVENOUS

## 2017-05-17 MED ORDER — DEXAMETHASONE SODIUM PHOSPHATE 10 MG/ML IJ SOLN
INTRAMUSCULAR | Status: AC
  Filled 2017-05-17: qty 1

## 2017-05-17 MED ORDER — GLYCOPYRROLATE 0.2 MG/ML IJ SOLN
INTRAMUSCULAR | Status: DC | PRN
  Administered 2017-05-17: 0.1 mg via INTRAVENOUS

## 2017-05-17 MED ORDER — SCOPOLAMINE 1 MG/3DAYS TD PT72
MEDICATED_PATCH | TRANSDERMAL | Status: AC
  Filled 2017-05-17: qty 1

## 2017-05-17 MED ORDER — SCOPOLAMINE 1 MG/3DAYS TD PT72
MEDICATED_PATCH | TRANSDERMAL | Status: DC | PRN
  Administered 2017-05-17: 1 via TRANSDERMAL

## 2017-05-17 MED ORDER — BUPIVACAINE HCL (PF) 0.5 % IJ SOLN
INTRAMUSCULAR | Status: DC | PRN
  Administered 2017-05-17: 11 mL

## 2017-05-17 MED ORDER — SODIUM CHLORIDE 0.9 % IJ SOLN
INTRAMUSCULAR | Status: DC | PRN
  Administered 2017-05-17: 80 mL

## 2017-05-17 MED ORDER — 0.9 % SODIUM CHLORIDE (POUR BTL) OPTIME
TOPICAL | Status: DC | PRN
  Administered 2017-05-17: 1000 mL

## 2017-05-17 MED ORDER — ROCURONIUM BROMIDE 50 MG/5ML IV SOSY
PREFILLED_SYRINGE | INTRAVENOUS | Status: AC
  Filled 2017-05-17: qty 5

## 2017-05-17 MED ORDER — ALBUTEROL SULFATE HFA 108 (90 BASE) MCG/ACT IN AERS: 2.0000 | INHALATION_SPRAY | Freq: Four times a day (QID) | RESPIRATORY_TRACT | Status: DC | PRN

## 2017-05-17 MED ORDER — ESMOLOL HCL 100 MG/10ML IV SOLN
INTRAVENOUS | Status: AC
  Filled 2017-05-17: qty 10

## 2017-05-17 MED ORDER — PROMETHAZINE HCL 25 MG/ML IJ SOLN
INTRAMUSCULAR | Status: AC
  Filled 2017-05-17: qty 1

## 2017-05-17 MED ORDER — ONDANSETRON HCL 4 MG/2ML IJ SOLN
4.0000 mg | INTRAMUSCULAR | Status: DC | PRN
  Administered 2017-05-17 – 2017-05-19 (×9): 4 mg via INTRAVENOUS
  Filled 2017-05-17 (×8): qty 2

## 2017-05-17 MED ORDER — MORPHINE SULFATE (PF) 4 MG/ML IV SOLN
1.0000 mg | INTRAVENOUS | Status: DC | PRN
  Administered 2017-05-17 – 2017-05-18 (×3): 1 mg via INTRAVENOUS
  Administered 2017-05-18: 2 mg via INTRAVENOUS
  Administered 2017-05-18: 1 mg via INTRAVENOUS
  Administered 2017-05-18 – 2017-05-19 (×5): 2 mg via INTRAVENOUS
  Filled 2017-05-17 (×10): qty 1

## 2017-05-17 MED ORDER — GLYCOPYRROLATE 0.2 MG/ML IV SOSY
PREFILLED_SYRINGE | INTRAVENOUS | Status: AC
  Filled 2017-05-17: qty 5

## 2017-05-17 MED ORDER — ACETAMINOPHEN 325 MG PO TABS: 650.0000 mg | ORAL_TABLET | ORAL | Status: DC | PRN

## 2017-05-17 MED ORDER — PREMIER PROTEIN SHAKE
2.0000 [oz_av] | ORAL | Status: DC
  Administered 2017-05-19 – 2017-05-21 (×9): 2 [oz_av] via ORAL
  Filled 2017-05-17 (×34): qty 325.31

## 2017-05-17 MED ORDER — ROCURONIUM BROMIDE 50 MG/5ML IV SOSY
PREFILLED_SYRINGE | INTRAVENOUS | Status: DC | PRN
  Administered 2017-05-17: 30 mg via INTRAVENOUS
  Administered 2017-05-17: 50 mg via INTRAVENOUS
  Administered 2017-05-17 (×3): 10 mg via INTRAVENOUS
  Administered 2017-05-17 (×2): 20 mg via INTRAVENOUS

## 2017-05-17 MED ORDER — BUPIVACAINE LIPOSOME 1.3 % IJ SUSP
20.0000 mL | Freq: Once | INTRAMUSCULAR | Status: AC
Start: 2017-05-17 — End: 2017-05-17
  Administered 2017-05-17: 20 mL
  Filled 2017-05-17: qty 20

## 2017-05-17 MED ORDER — SUGAMMADEX SODIUM 200 MG/2ML IV SOLN
INTRAVENOUS | Status: AC
  Filled 2017-05-17: qty 2

## 2017-05-17 MED ORDER — OXYCODONE HCL 5 MG/5ML PO SOLN: 5.0000 mg | ORAL | Status: DC | PRN

## 2017-05-17 MED ORDER — ENOXAPARIN SODIUM 30 MG/0.3ML ~~LOC~~ SOLN
30.0000 mg | Freq: Two times a day (BID) | SUBCUTANEOUS | Status: DC
  Administered 2017-05-18 – 2017-05-24 (×13): 30 mg via SUBCUTANEOUS
  Filled 2017-05-17 (×14): qty 0.3

## 2017-05-17 MED ORDER — ALBUTEROL SULFATE (2.5 MG/3ML) 0.083% IN NEBU: 2.5000 mg | INHALATION_SOLUTION | Freq: Four times a day (QID) | RESPIRATORY_TRACT | Status: DC | PRN

## 2017-05-17 MED ORDER — HYDROMORPHONE HCL-NACL 0.5-0.9 MG/ML-% IV SOSY: 0.2500 mg | PREFILLED_SYRINGE | INTRAVENOUS | Status: DC | PRN

## 2017-05-17 MED ORDER — LACTATED RINGERS IV SOLN
INTRAVENOUS | Status: DC | PRN
  Administered 2017-05-17 (×3): via INTRAVENOUS

## 2017-05-17 MED ORDER — MIDAZOLAM HCL 2 MG/2ML IJ SOLN
INTRAMUSCULAR | Status: AC
  Filled 2017-05-17: qty 2

## 2017-05-17 MED ORDER — ACETAMINOPHEN 10 MG/ML IV SOLN
1000.0000 mg | Freq: Four times a day (QID) | INTRAVENOUS | Status: DC
  Administered 2017-05-17 – 2017-05-18 (×3): 1000 mg via INTRAVENOUS
  Filled 2017-05-17 (×4): qty 100

## 2017-05-17 MED ORDER — SODIUM CHLORIDE 0.9 % IJ SOLN
INTRAMUSCULAR | Status: AC
  Filled 2017-05-17: qty 100

## 2017-05-17 MED ORDER — PROPOFOL 10 MG/ML IV BOLUS
INTRAVENOUS | Status: DC | PRN
  Administered 2017-05-17: 150 mg via INTRAVENOUS

## 2017-05-17 MED ORDER — SUCCINYLCHOLINE CHLORIDE 200 MG/10ML IV SOSY
PREFILLED_SYRINGE | INTRAVENOUS | Status: AC
  Filled 2017-05-17: qty 10

## 2017-05-17 MED ORDER — LABETALOL HCL 5 MG/ML IV SOLN
INTRAVENOUS | Status: AC
  Filled 2017-05-17: qty 4

## 2017-05-17 MED ORDER — SUGAMMADEX SODIUM 200 MG/2ML IV SOLN
INTRAVENOUS | Status: DC | PRN
  Administered 2017-05-17: 200 mg via INTRAVENOUS

## 2017-05-17 MED ORDER — PROPOFOL 10 MG/ML IV BOLUS
INTRAVENOUS | Status: AC
  Filled 2017-05-17: qty 20

## 2017-05-17 MED ORDER — ONDANSETRON HCL 4 MG/2ML IJ SOLN
INTRAMUSCULAR | Status: AC
  Filled 2017-05-17: qty 2

## 2017-05-17 MED ORDER — DEXAMETHASONE SODIUM PHOSPHATE 10 MG/ML IJ SOLN
INTRAMUSCULAR | Status: DC | PRN
  Administered 2017-05-17: 10 mg via INTRAVENOUS

## 2017-05-17 MED ORDER — LIDOCAINE 2% (20 MG/ML) 5 ML SYRINGE
INTRAMUSCULAR | Status: AC
  Filled 2017-05-17: qty 5

## 2017-05-17 MED ORDER — CLINDAMYCIN PHOSPHATE 900 MG/50ML IV SOLN
900.0000 mg | Freq: Once | INTRAVENOUS | Status: AC
  Administered 2017-05-17: 900 mg via INTRAVENOUS
  Filled 2017-05-17: qty 50

## 2017-05-17 MED ORDER — EPHEDRINE SULFATE 50 MG/ML IJ SOLN
INTRAMUSCULAR | Status: DC | PRN
  Administered 2017-05-17: 5 mg via INTRAVENOUS

## 2017-05-17 MED ORDER — VANCOMYCIN HCL IN DEXTROSE 1-5 GM/200ML-% IV SOLN
1000.0000 mg | INTRAVENOUS | Status: AC
  Administered 2017-05-17: 1000 mg via INTRAVENOUS
  Filled 2017-05-17: qty 200

## 2017-05-17 MED ORDER — ACETAMINOPHEN 10 MG/ML IV SOLN
INTRAVENOUS | Status: AC
  Administered 2017-05-17: 1000 mg
  Filled 2017-05-17: qty 100

## 2017-05-17 MED ORDER — MIDAZOLAM HCL 5 MG/5ML IJ SOLN
INTRAMUSCULAR | Status: DC | PRN
  Administered 2017-05-17: 2 mg via INTRAVENOUS

## 2017-05-17 MED ORDER — ACETAMINOPHEN 160 MG/5ML PO SOLN: 325.0000 mg | ORAL | Status: DC | PRN

## 2017-05-17 MED ORDER — FENTANYL CITRATE (PF) 100 MCG/2ML IJ SOLN
INTRAMUSCULAR | Status: DC | PRN
  Administered 2017-05-17 (×9): 50 ug via INTRAVENOUS

## 2017-05-17 MED ORDER — LIDOCAINE 2% (20 MG/ML) 5 ML SYRINGE
INTRAMUSCULAR | Status: DC | PRN
  Administered 2017-05-17: 100 mg via INTRAVENOUS

## 2017-05-17 SURGICAL SUPPLY — 99 items
ADH SKN CLS APL DERMABOND .7 (GAUZE/BANDAGES/DRESSINGS) ×4
APL ESCP 34 STRL LF DISP (HEMOSTASIS)
APL SKNCLS STERI-STRIP NONHPOA (GAUZE/BANDAGES/DRESSINGS)
APL SRG 38 LTWT LNG FL B (MISCELLANEOUS)
APPLICATOR ARISTA FLEXITIP XL (MISCELLANEOUS) IMPLANT
APPLICATOR SURGIFLO ENDO (HEMOSTASIS) IMPLANT
APPLIER CLIP ROT 10 11.4 M/L (STAPLE)
APR CLP MED LRG 11.4X10 (STAPLE)
BAG LAPAROSCOPIC 12 15 PORT 16 (BASKET) IMPLANT
BAG RETRIEVAL 12/15 (BASKET) ×3
BAG SPEC RTRVL 10 TROC 200 (ENDOMECHANICALS) ×2
BAG SPEC THK2 15X12 ZIP CLS (MISCELLANEOUS) ×2
BAG ZIPLOCK 12X15 (MISCELLANEOUS) ×3 IMPLANT
BENZOIN TINCTURE PRP APPL 2/3 (GAUZE/BANDAGES/DRESSINGS) IMPLANT
BLADE EXTENDED COATED 6.5IN (ELECTRODE) IMPLANT
BLADE HEX COATED 2.75 (ELECTRODE) IMPLANT
BLADE SURG SZ10 CARB STEEL (BLADE) IMPLANT
CHLORAPREP W/TINT 26ML (MISCELLANEOUS) ×6 IMPLANT
CLIP APPLIE ROT 10 11.4 M/L (STAPLE) IMPLANT
CLIP LIGATING HEM O LOK PURPLE (MISCELLANEOUS) ×3 IMPLANT
CLIP LIGATING HEMO LOK XL GOLD (MISCELLANEOUS) IMPLANT
CLIP LIGATING HEMO O LOK GREEN (MISCELLANEOUS) ×4 IMPLANT
CUTTER FLEX LINEAR 45M (STAPLE) IMPLANT
DECANTER SPIKE VIAL GLASS SM (MISCELLANEOUS) ×3 IMPLANT
DERMABOND ADVANCED (GAUZE/BANDAGES/DRESSINGS) ×2
DERMABOND ADVANCED .7 DNX12 (GAUZE/BANDAGES/DRESSINGS) ×2 IMPLANT
DRAPE INCISE IOBAN 66X45 STRL (DRAPES) ×3 IMPLANT
DRAPE UTILITY XL STRL (DRAPES) ×3 IMPLANT
DRAPE WARM FLUID 44X44 (DRAPE) IMPLANT
DRSG TEGADERM 2-3/8X2-3/4 SM (GAUZE/BANDAGES/DRESSINGS) ×1 IMPLANT
ELECT PENCIL ROCKER SW 15FT (MISCELLANEOUS) ×3 IMPLANT
ELECT REM PT RETURN 15FT ADLT (MISCELLANEOUS) ×6 IMPLANT
GLOVE BIOGEL M STRL SZ7.5 (GLOVE) ×3 IMPLANT
GLOVE BIOGEL PI IND STRL 7.0 (GLOVE) ×2 IMPLANT
GLOVE BIOGEL PI INDICATOR 7.0 (GLOVE) ×1
GLOVE ECLIPSE 8.0 STRL XLNG CF (GLOVE) ×3 IMPLANT
GLOVE INDICATOR 8.0 STRL GRN (GLOVE) ×6 IMPLANT
GOWN STRL REUS W/TWL LRG LVL3 (GOWN DISPOSABLE) ×9 IMPLANT
GOWN STRL REUS W/TWL XL LVL3 (GOWN DISPOSABLE) ×6 IMPLANT
HANDLE SUCTION POOLE (INSTRUMENTS) IMPLANT
HEMOSTAT ARISTA ABSORB 3G PWDR (MISCELLANEOUS) IMPLANT
HEMOSTAT SURGICEL 4X8 (HEMOSTASIS) ×1 IMPLANT
IRRIG SUCT STRYKERFLOW 2 WTIP (MISCELLANEOUS) ×3
IRRIGATION SUCT STRKRFLW 2 WTP (MISCELLANEOUS) ×2 IMPLANT
KIT BASIN OR (CUSTOM PROCEDURE TRAY) ×6 IMPLANT
MANIFOLD NEPTUNE II (INSTRUMENTS) ×3 IMPLANT
NDL SPNL 22GX3.5 QUINCKE BK (NEEDLE) ×2 IMPLANT
NEEDLE SPNL 22GX3.5 QUINCKE BK (NEEDLE) ×3 IMPLANT
PAD POSITIONING PINK XL (MISCELLANEOUS) IMPLANT
POSITIONER SURGICAL ARM (MISCELLANEOUS) ×6 IMPLANT
POUCH RETRIEVAL ECOSAC 10 (ENDOMECHANICALS) IMPLANT
POUCH RETRIEVAL ECOSAC 10MM (ENDOMECHANICALS) ×1
RELOAD 45 VASCULAR/THIN (ENDOMECHANICALS) IMPLANT
RELOAD STAPLE 45 2.5 WHT GRN (ENDOMECHANICALS) IMPLANT
RELOAD STAPLE 45 3.5 BLU ETS (ENDOMECHANICALS) IMPLANT
RELOAD STAPLE 60 2.6 WHT THN (STAPLE) IMPLANT
RELOAD STAPLE 60 3.8 GOLD REG (STAPLE) IMPLANT
RELOAD STAPLE TA45 3.5 REG BLU (ENDOMECHANICALS) IMPLANT
RELOAD STAPLER GOLD 60MM (STAPLE) ×6 IMPLANT
RELOAD STAPLER WHITE 60MM (STAPLE) ×10 IMPLANT
SCISSORS LAP 5X35 DISP (ENDOMECHANICALS) IMPLANT
SHEARS HARMONIC ACE PLUS 36CM (ENDOMECHANICALS) ×3 IMPLANT
SLEEVE XCEL OPT CAN 5 100 (ENDOMECHANICALS) ×7 IMPLANT
SOLUTION ANTI FOG 6CC (MISCELLANEOUS) ×3 IMPLANT
SPONGE LAP 4X18 X RAY DECT (DISPOSABLE) IMPLANT
STAPLER ECHELON LONG 60 440 (INSTRUMENTS) ×1 IMPLANT
STAPLER RELOAD GOLD 60MM (STAPLE) ×9
STAPLER RELOAD WHITE 60MM (STAPLE) ×15
STRIP CLOSURE SKIN 1/2X4 (GAUZE/BANDAGES/DRESSINGS) IMPLANT
SUCTION POOLE HANDLE (INSTRUMENTS)
SUT ETHILON 2 0 PS N (SUTURE) ×1 IMPLANT
SUT MNCRL AB 4-0 PS2 18 (SUTURE) ×7 IMPLANT
SUT PDS AB 0 CT1 36 (SUTURE) ×4 IMPLANT
SUT SILK 2 0 SH (SUTURE) ×3 IMPLANT
SUT SILK 2 0 SH CR/8 (SUTURE) IMPLANT
SUT VIC AB 2-0 CT1 27 (SUTURE)
SUT VIC AB 2-0 CT1 27XBRD (SUTURE) IMPLANT
SUT VIC AB 3-0 SH 27 (SUTURE) ×6
SUT VIC AB 3-0 SH 27XBRD (SUTURE) IMPLANT
SUT VIC AB 4-0 PS2 27 (SUTURE) IMPLANT
SUT VICRYL 0 UR6 27IN ABS (SUTURE) ×7 IMPLANT
SUT VICRYL 2 0 18  UND BR (SUTURE)
SUT VICRYL 2 0 18 UND BR (SUTURE) IMPLANT
TAPE CLOTH 4X10 WHT NS (GAUZE/BANDAGES/DRESSINGS) IMPLANT
TOWEL OR 17X26 10 PK STRL BLUE (TOWEL DISPOSABLE) ×7 IMPLANT
TOWEL OR NON WOVEN STRL DISP B (DISPOSABLE) ×6 IMPLANT
TRAY FOLEY W/METER SILVER 16FR (SET/KITS/TRAYS/PACK) IMPLANT
TRAY LAPAROSCOPIC (CUSTOM PROCEDURE TRAY) ×6 IMPLANT
TROCAR BLADELESS OPT 5 100 (ENDOMECHANICALS) ×3 IMPLANT
TROCAR UNIVERSAL OPT 12M 100M (ENDOMECHANICALS) ×3 IMPLANT
TROCAR XCEL 12X100 BLDLESS (ENDOMECHANICALS) ×4 IMPLANT
TROCAR XCEL BLUNT TIP 100MML (ENDOMECHANICALS) IMPLANT
TROCAR XCEL NON-BLD 11X100MML (ENDOMECHANICALS) IMPLANT
TROCAR XCEL UNIV SLVE 11M 100M (ENDOMECHANICALS) IMPLANT
TUBING CONNECTING 10 (TUBING) ×1 IMPLANT
TUBING ENDO SMARTCAP PENTAX (MISCELLANEOUS) ×1 IMPLANT
TUBING INSUF HEATED (TUBING) ×6 IMPLANT
YANKAUER SUCT BULB TIP 10FT TU (MISCELLANEOUS) IMPLANT
YANKAUER SUCT BULB TIP NO VENT (SUCTIONS) IMPLANT

## 2017-05-17 NOTE — Op Note (Signed)
Operative Note  [place] female 46 y.o. 05/17/2017  PREOPERATIVE DX:  Gastric wall tumor (lesser curvature)  POSTOPERATIVE DX:  Same  PROCEDURE:   Laparoscopic resection of gastric wall tumor Upper endoscopy by Dr. Lucia Gaskins         Surgeon: Jackolyn Confer J   Assistants: Alphonsa Overall M.D.  Anesthesia: General endotracheal anesthesia  Indications:   This is a 46 year old female having severe right flank pain. CT scan demonstrated right hydronephrosis due to an obstructing lesion in that area with an atrophic right kidney. There is incidentally found enhancing gastric mass on the lesser curvature of the stomach emanating from the gastric wall. It measured approximately 2.2 cm. Upper endoscopy demonstrated no intrinsic luminal lesion. Attempted needle biopsy just demonstrated benign gastric cells. She is now here for excision of this tumor. Dr. Louis Meckel is going to follow with right nephrectomy.  Procedure Detail:  She was brought to the operating room placed supine on the operating table and a general anesthetic was administered. An nasogastric tube was inserted. A Foley catheter was inserted. The abdominal wall was widely sterilely prepped and draped. A timeout was performed.  She was put in slight reverse Trendelenburg position. A 5 mm incision was made in the left subcostal area. Using a 5 mm Optiview trocar and laparoscope, access was gained into the peritoneal cavity and a pneumoperitoneum was created. Inspection of the area under the trocar demonstrated no evidence of bleeding or organ injury. A 5 mm trocar was placed just to the left of the umbilicus. A 5 mm trocar was placed in the right mid lateral abdomen. A 5 mm trocar was placed in the right mid abdomen. A 5 mm trocar was placed in the lateral left upper quadrant.  Using the harmonic scalpel, the lesser sac was opened beginning at the body of the stomach and carried up to the fundus and down toward the antrum. I  gastric wall mass was visualized. Stay sutures of 2-0 silk were placed proximally and distally to the mass. I then went to the lesser curvature and mobilized some of by dividing part of the gastrohepatic ligament using the Harmonic scalpel. I was able to hold the 2 sutures up directly anteriorly. The liver appeared normal. There are no omental implants.   The lateral right-sided trocar was upsized to a 12 mm. Using a laparoscopic gastrointestinal stapler with gold loads, the tumor was resected with adequate gross margins. The tumor was placed in a retrieval bag and removed through the 12 mm port site.   The staple line appeared solid without bleeding or leak. Upper endoscopy was performed and the stomach insufflated. This staple line was placed under irrigation fluid and there was no evidence of leak. There is no evidence of intragastric bleeding.  Specimen was sent fresh to pathology and the lesion was cut open and noted. It coincided with the lesion found on CT scan.  She tolerated this part of the procedure well without any apparent complications. Dr. Louis Meckel is proceeding with the right simple nephrectomy.  Estimated Blood Loss:  100 ml         Specimens: Gastric wall tumor        Complications:  * No complications entered in OR log *         Disposition: Dr. Louis Meckel is proceeding with right simple nephrectomy

## 2017-05-17 NOTE — H&P (Signed)
hydronephrosis.  HPI: [name] is a 46 year-old female established patient who is here for hydronephrosis.  The problem is on the right side. She is having pain. She is currently having flank pain and back pain. She denies having groin pain, nausea, vomiting, fever, chills, and voiding symptoms. The patient has had recent labs. The patient had some abnormal labsnot.   She has had previous abdominal surgery. She has had 1 surgery. She has not had a stent placed in her kidney. She has not received radiation therapy. The patient does not have a history of recurrent UTIs.   BUN/Cr - 9/0.88  Cholecystectomy  h/o of Endometriosis with h/o hysterectomy in 2004, likely has endometrium on distal right ureter causing obstruction.   Renogram - right kidney 6% function/left kidney 94%  CT - 02/26/17 - atrophic right kidney, simple hilum, hydronephrosis down to right distal ureter. 2.4 cm enhancing mass on posterior/lesser curvature of stomach.       ALLERGIES: Ibuprofen Penicillin    MEDICATIONS: Apple Cider Vinegar  Hair Vitamin  Juice Plus     GU PSH: Hysterectomy Locm 300-399Mg /Ml Iodine,1Ml - 02/26/2017    NON-GU PSH: Cholecystectomy (laparoscopic)    GU PMH: Oth hydronephrosis (Stable), She was concerned about the cause of her hydronephrosis and I told her at the time of her nephrectomy either Dr. Louis Meckel or the general surgeon could potentially evaluate the area where she is obstructed and a biopsy this if necessary. We discussed the possible causes including a possible malignant cause but more likely, with her history of extensive endometriosis, the fact that this is much more likely to be endometriosis or scar from previous endometriosis. - 03/18/2017, Right, We went over the results of her CT scan report in detail. She has an area of ureteral obstruction in the pelvis associated with what appears to be some scarring or possibly some form of benign or malignant neoplastic condition.  It appears to have been present for quite some time as she has lost the majority of the parenchyma of her right kidney. In addition she was found to have an enhancing lesion associated with the lesser curvature of the stomach which could be endometriosis but further characterization is necessary. I told her that at this point my recommendation was to proceed with upper endoscopy with endoscopic ultrasound and biopsy by a gastroenterologist and also further evaluation of the amount of renal function present on the right side. We discussed the fact that if there is minimal function this may direct surgical therapy differently than if there is a salvageable amount of function left in the kidney. I therefore have recommended a renogram and follow-up after her gastric lesion and the renogram had been completed., - 03/04/2017 Renal atrophy, Right, She has marked thinning of her renal parenchyma on the right hand side. I need to check a creatinine today. - 02/21/2017 RLQ pain, She's having right and left lower quadrant abdominal pain. With a strong history of endometriosis I suspect that may be what is occurring. - 02/21/2017 Endometriosis Ot    NON-GU PMH: Neoplasm of uncertain behavior of stomach, I'm going to schedule her for an appointment with Dr. Ardis Hughs for consideration of endoscopic ultrasound and biopsy. Once she has undergone a biopsy I will then have her return to discuss further management of her right kidney. - 03/04/2017 Asthma GERD    FAMILY HISTORY: 3 Son's - Other Breast Cancer - Sister, Grandmother Colon Cancer - Father, Runs in Family Death of family member -  Father, Mother Diabetes - Runs in Family Kidney Stones - Son pneumonia - Mother   SOCIAL HISTORY: Marital Status: Married Current Smoking Status: Patient has never smoked.   Tobacco Use Assessment Completed: Used Tobacco in last 30 days? Does not use smokeless tobacco. Has never drank.  Does not drink caffeine. 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    REVIEW OF SYSTEMS:    GU Review Female:   Patient denies leakage of urine, burning /pain with urination, have to strain to urinate, frequent urination, hard to postpone urination, get up at night to urinate, trouble starting your stream, being pregnant, and stream starts and stops.  Gastrointestinal (Upper):   Patient denies nausea, vomiting, and indigestion/ heartburn.  Gastrointestinal (Lower):   Patient denies diarrhea and constipation.  Constitutional:   Patient denies fever, night sweats, weight loss, and fatigue.  Skin:   Patient denies skin rash/ lesion and itching.  Eyes:   Patient denies blurred vision and double vision.  Ears/ Nose/ Throat:   Patient denies sore throat and sinus problems.  Hematologic/Lymphatic:   Patient denies swollen glands and easy bruising.  Cardiovascular:   Patient denies leg swelling and chest pains.  Respiratory:   Patient denies cough and shortness of breath.  Endocrine:   Patient denies excessive thirst.  Musculoskeletal:   Patient denies back pain and joint pain.  Neurological:   Patient denies headaches and dizziness.  Psychologic:   Patient denies depression and anxiety.   VITAL SIGNS:      04/02/2017 04:21 PM  Weight 193 lb / 87.54 kg  Height 60 in / 152.4 cm  BP 143/84 mmHg  Pulse 89 /min  Temperature 98.2 F / 37 C  BMI 37.7 kg/m   MULTI-SYSTEM PHYSICAL EXAMINATION:    Constitutional: Well-nourished. No physical deformities. Normally developed. Good grooming.  Neck: Neck symmetrical, not swollen. Normal tracheal position.  Respiratory: No labored breathing, no use of accessory muscles. CTA- BILATERALLY  Cardiovascular: Normal temperature, normal extremity pulses, no swelling, no varicosities. RRR  Lymphatic: No enlargement of neck, axillae, groin.  Skin: No paleness, no jaundice, no cyanosis. No lesion, no ulcer, no rash.  Neurologic / Psychiatric: Oriented to time, oriented to place, oriented  to person. No depression, no anxiety, no agitation.  Gastrointestinal: No mass, no tenderness, no rigidity, non obese abdomen. right CVA tenderness  Eyes: Normal conjunctivae. Normal eyelids.  Ears, Nose, Mouth, and Throat: Left ear no scars, no lesions, no masses. Right ear no scars, no lesions, no masses. Nose no scars, no lesions, no masses. Normal hearing. Normal lips.  Musculoskeletal: Normal gait and station of head and neck.     PAST DATA REVIEWED:  Source Of History:  Patient  Records Review:   Previous Patient Records  X-Ray Review: C.T. Abdomen/Pelvis: Reviewed Films.  Lasix Renogram: Reviewed Films.     PROCEDURES:          Urinalysis w/Scope - 81001 Dipstick Dipstick Cont'd Micro  Color: Yellow Bilirubin: Neg WBC/hpf: NS (Not Seen)  Appearance: Cloudy Ketones: Neg RBC/hpf: 0 - 2/hpf  Specific Gravity: 1.020 Blood: Neg Bacteria: Rare (0-9/hpf)  pH: 6.0 Protein: Neg Cystals: NS (Not Seen)  Glucose: Neg Urobilinogen: 0.2 Casts: NS (Not Seen)    Nitrites: Neg Trichomonas: Not Present    Leukocyte Esterase: Neg Mucous: Not Present      Epithelial Cells: 0 - 5/hpf      Yeast: NS (Not Seen)      Sperm: Not Present    Notes:  ASSESSMENT:      ICD-10 Details  1 GU:   Hydronephrosis Unspec - N13.30    PLAN:           Document Letter(s):  Created for Patient: Clinical Summary         Notes:   The patient has an atrophic nonfunctioning right kidney with hydroureteronephrosis down to the distal ureter. At that point, it appears to be extrinsic compression from endometriosis or possibly a "drop lesion"From the neoplastic process involving her lesser curvature of the stomach.She is symptomatic having significant amount of right CVA and lower abdominal pain.   Our plan is to perform a laparoscopic nephrectomy.We would do this in combination with general surgery at the time of the excisional biopsy of the lesion in the posterior or lesser curvature of the stomach. We  would dissected the ureter down to the distal area and excise the endometrial lesion or soft tissue that is compressing the ureter.   I detailed the various treatment options with the patient and ultimately I recommended a laparoscopic radical nephrectomy. I went over this operation in detail with the patient including the risks and benefits. We discussed port position and I outlined the position of the 4 planned trocars. I also explained to him that he will need extraction incision which typically is in the left lower quadrant. I discussed the actual surgery with him and we went over the various structures better intimate association with the kidney and the risk of damage thereof. I outlined the risk of injury to the major nerves and vessels in proximity to the kidney. I explained the patient the expected hospital course. I told him that he should plan to be in the hospital at least 2-3 days. Further, I told her that she would likely need approximately 1 month to fully recover. We will plan to schedule ASAP.

## 2017-05-17 NOTE — Anesthesia Procedure Notes (Signed)
Procedure Name: Intubation Date/Time: 05/17/2017 7:41 AM Performed by: Maxwell Caul Pre-anesthesia Checklist: Patient identified, Emergency Drugs available, Suction available and Patient being monitored Patient Re-evaluated:Patient Re-evaluated prior to induction Oxygen Delivery Method: Circle system utilized Preoxygenation: Pre-oxygenation with 100% oxygen Induction Type: IV induction, Rapid sequence and Cricoid Pressure applied Laryngoscope Size: Mac and 4 Grade View: Grade I Tube type: Oral Tube size: 7.5 mm Number of attempts: 1 Airway Equipment and Method: Stylet Placement Confirmation: ETT inserted through vocal cords under direct vision,  positive ETCO2 and breath sounds checked- equal and bilateral Secured at: 21 cm Tube secured with: Tape Dental Injury: Teeth and Oropharynx as per pre-operative assessment

## 2017-05-17 NOTE — H&P (View-Only) (Signed)
[name] 04/12/2017 9:17 AM Location: [facility] Patient #: [id number] DOB: 1971/02/26 Married / Language: English / Race: White Female   History of Present Illness Whitney Hollingshead MD; 04/15/2017 9:20 AM) The patient is a 46 year old female.  Note:She is referred by Dr. Louis [name] for consultation regarding an incidentally found enhancing mass on the lesser curvature of the gastric wall. She was having right flank and CVA pain. A CT scan demonstrated right hydronephrosis with an atrophic right kidney. There appeared to be a lesion near the distal right ureter causing the obstruction. She was also noted to have a enhancing lesion of the lesser curvature gastric wall. She underwent upper EUS which demonstrated a soft tissue lesion that was not mucosal but rather involved the serosa and measured 2.2 cm. FNA was performed whch demonstrated benign gastric cells. [name] is planning on a right nephrectomy and inspection of the area in the pelvis leading to the ureteral obstruction. She does have a history of endometriosis.  Past Surgical History Whitney Lorenzo, LPN; 02/06/346 4:25 AM) Cesarean Section - Multiple  Colon Polyp Removal - Colonoscopy  Gallbladder Surgery - Laparoscopic  Hysterectomy (due to cancer) - Partial  Hysterectomy (not due to cancer) - Partial  Tonsillectomy   Diagnostic Studies History Whitney Lorenzo, LPN; 07/10/6386 5:64 AM) Colonoscopy  5-10 years ago Mammogram  within last year Pap Smear  1-5 years ago  Allergies Whitney Lorenzo, LPN; 01/05/2950 8:84 AM) Penicillins  Keflex *CEPHALOSPORINS*  Doxycycline *DERMATOLOGICALS*  Allergies Reconciled   Medication History Whitney Lorenzo, LPN; 11/10/6061 0:16 AM) Juice Plus Fibre (Oral) Active. Biotin Plus Keratin (10000-100MCG-MG Tablet, Oral) Active.  Social History Whitney Lorenzo, LPN; 0/11/930 3:55 AM) No alcohol use  No caffeine use  No drug use  Tobacco use  Never  smoker.  Family History Whitney Lorenzo, LPN; 05/07/2201 5:42 AM) Alcohol Abuse  Brother, Family Members In General, Mother. Breast Cancer  Sister. Cancer  Family Members In General, Mother. Cerebrovascular Accident  Mother. Cervical Cancer  Mother. Colon Cancer  Father. Colon Polyps  Family Members In General, Father, Sister, Son. Diabetes Mellitus  Family Members In General. Heart Disease  Family Members In General. Heart disease in female family member before age 99  Hypertension  Brother, Mother, Son. Kidney Disease  Sister. Ovarian Cancer  Mother, Sister. Respiratory Condition  Mother. Thyroid problems  Sister.  Pregnancy / Birth History Whitney Lorenzo, LPN; 7/0/6237 6:28 AM) Age at menarche  23 years. Gravida  6 Length (months) of breastfeeding  3-6 Maternal age  45-20 Para  3  Other Problems Whitney Lorenzo, LPN; 01/04/5175 1:60 AM) Asthma  Cholelithiasis  Chronic Renal Failure Syndrome  Gastroesophageal Reflux Disease     Review of Systems Whitney Billings Dockery LPN; 05/07/7105 2:69 AM) General Present- Weight Gain and Weight Loss. Not Present- Appetite Loss, Chills, Fatigue, Fever and Night Sweats. Skin Not Present- Change in Wart/Mole, Dryness, Hives, Jaundice, New Lesions, Non-Healing Wounds, Rash and Ulcer. HEENT Not Present- Earache, Hearing Loss, Hoarseness, Nose Bleed, Oral Ulcers, Ringing in the Ears, Seasonal Allergies, Sinus Pain, Sore Throat, Visual Disturbances, Wears glasses/contact lenses and Yellow Eyes. Respiratory Not Present- Bloody sputum, Chronic Cough, Difficulty Breathing, Snoring and Wheezing. Breast Not Present- Breast Mass, Breast Pain, Nipple Discharge and Skin Changes. Cardiovascular Not Present- Chest Pain, Difficulty Breathing Lying Down, Leg Cramps, Palpitations, Rapid Heart Rate, Shortness of Breath and Swelling of Extremities. Gastrointestinal Present- Abdominal Pain and Bloating. Not Present- Bloody Stool, Change in Bowel  Habits, Chronic diarrhea, Constipation,  Difficulty Swallowing, Excessive gas, Gets full quickly at meals, Hemorrhoids, Indigestion, Nausea, Rectal Pain and Vomiting. Female Genitourinary Present- Frequency and Nocturia. Not Present- Painful Urination, Pelvic Pain and Urgency. Musculoskeletal Present- Back Pain. Not Present- Joint Pain, Joint Stiffness, Muscle Pain, Muscle Weakness and Swelling of Extremities. Neurological Not Present- Decreased Memory, Fainting, Headaches, Numbness, Seizures, Tingling, Tremor, Trouble walking and Weakness. Psychiatric Not Present- Anxiety, Bipolar, Change in Sleep Pattern, Depression, Fearful and Frequent crying. Endocrine Present- Hair Changes. Not Present- Cold Intolerance, Excessive Hunger, Heat Intolerance, Hot flashes and New Diabetes. Hematology Not Present- Blood Thinners, Easy Bruising, Excessive bleeding, Gland problems, HIV and Persistent Infections.  Vitals Whitney Billings Dockery LPN; 01/06/4355 8:61 AM) 04/12/2017 9:24 AM Weight: 191 lb Height: 60in Body Surface Area: 1.83 m Body Mass Index: 37.3 kg/m  Temp.: 98.97F(Oral)  Pulse: 115 (Regular)  P.OX: 97% (Room air) BP: 142/101 (Sitting, Left Arm, Standard)       Physical Exam Whitney Hollingshead MD; 04/15/2017 9:13 AM) The physical exam findings are as follows: Note:GENERAL APPEARANCE: Obese female in NAD. Pleasant and cooperative.  EARS, NOSE, MOUTH THROAT: [name]/AT external ears: no lesions or deformities external nose: no lesions or deformities hearing: grossly normal lips: moist, no deformities EYES external: conjunctiva, lids, sclerae normal pupils: equal, round  NECK: Supple, no obvious mass or thyroid mass/enlargement, no trachea deviation, scar on lower right side  CV ascultation: RRR, no murmur extremity edema: no extremity varicosities: no  RESP auscultation: breath sounds equal and clear respiratory effort: normal  GASTROINTESTINAL abdomen: Soft, non-tender,  non-distended, no masses liver and spleen: not enlarged. 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 I have explained the procedure and risks. Risks include but are not limited to bleeding, infection, wound problems, anesthesia, anastomotic leak, need for reoperative surgery, injury to intraabominal organs (such as intestine, spleen, kidney etc.), ileus, irregular bowel habits. She seems to understand and agrees with the plan. Will coordinate with Dr. Louis [name].  Jackolyn Confer, M.D.

## 2017-05-17 NOTE — Interval H&P Note (Signed)
History and Physical Interval Note:  05/17/2017 7:23 AM  [name]  has presented today for surgery, with the diagnosis of stomach mass  The various methods of treatment have been discussed with the patient and family. After consideration of risks, benefits and other options for treatment, the patient has consented to  Procedure(s): LAPAROSCOPIC POSSIBLE OPEN REMOVAL OF STOMACH MASS (N/A) RIGHT LAPAROSCOPIC SIMPLE NEPHRECTOMY (Right) as a surgical intervention .  The patient's history has been reviewed, patient examined, no change in status, stable for surgery.  I have reviewed the patient's chart and labs.  Questions were answered to the patient's satisfaction.     Darlisa Spruiell Lenna Sciara

## 2017-05-17 NOTE — Discharge Instructions (Addendum)
[facility] ______[facility] ABDOMINAL SURGERY: POST OP INSTRUCTIONS Always review your discharge instruction sheet given to you by the facility where your surgery was performed. IF YOU HAVE DISABILITY OR FAMILY LEAVE FORMS, YOU MUST BRING THEM TO THE OFFICE FOR PROCESSING.   DO NOT GIVE THEM TO YOUR DOCTOR.  1. A prescription for pain medication may be given to you upon discharge.  Take your pain medication as prescribed, if needed.  If narcotic pain medicine is not needed, then you may take acetaminophen (Tylenol) or ibuprofen (Advil) as needed. 2. Take your usually prescribed medications unless otherwise directed. 3. If you need a refill on your pain medication, please contact your pharmacy.  They will contact our office to request authorization. Prescriptions will not be filled after 5pm or on week-ends. 4. You should follow a soft diet for one week after arrival home. Six small meals per day. DO NOT OVEREAT!  Be sure to include lots of fluids daily.   5. Most patients will experience some swelling and bruising in the area of the incisions.  Ice packs will help.  Swelling and bruising can take several days to resolve.  6. It is common to experience some constipation if taking pain medication after surgery.  Increasing fluid intake and taking a stool softener (such as Colace) will usually help or prevent this problem from occurring.  A mild laxative (Milk of Magnesia or Miralax) should be taken according to package instructions if there are no bowel movements after 48 hours. 7. Unless discharge instructions indicate otherwise, you may remove your bandages 72 hours after surgery.  You may shower the day after surgery.  You may have steri-strips (small skin tapes) in place directly over the incision.  These strips should be left on the skin until they fall off.  If your surgeon used skin glue on the incision, you may shower in 24 hours.  The glue will flake off over the next 2-3 weeks.  Any  sutures or staples will be removed at the office during your follow-up visit. 8. ACTIVITIES:  You may resume regular (light) daily activities beginning the next day--such as daily self-care, walking, climbing stairs--gradually increasing activities as tolerated.  You may have sexual intercourse when it is comfortable.  Refrain from any heavy lifting or straining until released by your surgeons.  Do not lift anything over 10 pounds.  a. You may drive when you are no longer taking prescription pain medication, you can comfortably wear a seatbelt, and you can safely maneuver your car and apply brakes. b. RETURN TO WORK:  When released by the surgeons .________________________________________________________ 9. You should see your doctor in the office for a follow-up appointment approximately 2-3 weeks after your surgery.  Make sure that you call for this appointment within a day or two after you arrive home to insure a convenient appointment time. 10. OTHER INSTRUCTIONS: __________________________________________________________________________________________________________________________ __________________________________________________________________________________________________________________________ WHEN TO CALL YOUR DOCTOR: 1. Fever over 101.0 2. Inability to urinate 3. Continued bleeding from incision. 4. Increased pain, redness, or drainage from the incision. 5. Increasing abdominal pain  The clinic staff is available to answer your questions during regular business hours.  Please dont hesitate to call and ask to speak to one of the nurses for clinical concerns.  If you have a medical emergency, go to the nearest emergency room or call 911.  A surgeon from [facility] is always on call at the hospital. [address], [place], [place], [place]  [address] ?  [id number], [place], [place]   [address] [phone] ? [phone] ? FAX [phone] Web site:  [email or web address]

## 2017-05-17 NOTE — Anesthesia Preprocedure Evaluation (Addendum)
Anesthesia Evaluation  Patient identified by MRN, date of birth, ID band Patient awake    Reviewed: Allergy & Precautions, NPO status , Patient's Chart, lab work & pertinent test results  Airway Mallampati: II  TM Distance: >3 FB Neck ROM: Full    Dental  (+) Dental Advisory Given   Pulmonary asthma ,    breath sounds clear to auscultation       Cardiovascular negative cardio ROS   Rhythm:Regular Rate:Normal     Neuro/Psych Anxiety    GI/Hepatic Neg liver ROS, GERD  ,Stomach mass   Endo/Other  negative endocrine ROS  Renal/GU Renal mass     Musculoskeletal  (+) Arthritis ,   Abdominal (+) + obese,   Peds  Hematology negative hematology ROS (+)   Anesthesia Other Findings   Reproductive/Obstetrics                           Lab Results  Component Value Date   WBC 12.2 (H) 05/14/2017   HGB 13.6 05/14/2017   HCT 42.6 05/14/2017   MCV 84.9 05/14/2017   PLT 378 05/14/2017   Lab Results  Component Value Date   CREATININE 0.87 05/14/2017   BUN 14 05/14/2017   NA 137 05/14/2017   K 4.2 05/14/2017   CL 103 05/14/2017   CO2 26 05/14/2017    Anesthesia Physical Anesthesia Plan  ASA: III  Anesthesia Plan: General   Post-op Pain Management:    Induction: Intravenous  PONV Risk Score and Plan: 4 or greater and Ondansetron, Dexamethasone, Propofol, Midazolam and Scopolamine patch - Pre-op  Airway Management Planned: Oral ETT  Additional Equipment:   Intra-op Plan:   Post-operative Plan: Extubation in OR and Possible Post-op intubation/ventilation  Informed Consent: I have reviewed the patients History and Physical, chart, labs and discussed the procedure including the risks, benefits and alternatives for the proposed anesthesia with the patient or authorized representative who has indicated his/her understanding and acceptance.   Dental advisory given  Plan Discussed with:  CRNA  Anesthesia Plan Comments:        Anesthesia Quick Evaluation

## 2017-05-17 NOTE — Anesthesia Postprocedure Evaluation (Signed)
Anesthesia Post Note  Patient: Meli Faley Hill  Procedure(s) Performed: Procedure(s) (LRB): LAPAROSCOPIC POSSIBLE OPEN REMOVAL OF STOMACH MASS (N/A) RIGHT RADICAL NEPHRECTOMY, URETERECTOMY (Right)     Patient location during evaluation: PACU Anesthesia Type: General Level of consciousness: awake and alert Pain management: pain level controlled Vital Signs Assessment: post-procedure vital signs reviewed and stable Respiratory status: spontaneous breathing, nonlabored ventilation, respiratory function stable and patient connected to nasal cannula oxygen Cardiovascular status: blood pressure returned to baseline and stable Postop Assessment: no signs of nausea or vomiting Anesthetic complications: no    Last Vitals:  Vitals:   05/17/17 1500 05/17/17 1515  BP: (!) 148/89 (!) 149/84  Pulse: 93 93  Resp: 20 19  Temp:      Last Pain:  Vitals:   05/17/17 1445  TempSrc:   PainSc: 7                  Tylan Kinn,W. EDMOND

## 2017-05-17 NOTE — Progress Notes (Signed)
Patient ambulated around 40 feet in hallway with standby assist. Tolerated fairly well, c/o 8 out of 10 pain to abdomen after ambulation. PRN Morphine 1mg  given per request. Continues to c/o nausea but no vomiting, did dry heave several times. Zofran effective at this time. IS performed- patient achieved to 750. Will continue to encourage IS and ambulation.

## 2017-05-17 NOTE — Op Note (Signed)
Preoperative diagnosis:  1. Right atrophic  nonfunctioning kidney 2. Right distal ureteral obstruction secondary to extrinsic mass  Postoperative diagnosis:  1. same   Procedure: 1. Laparoscopic right radical nephrectomy with distal ureterectomy and pelvic mass excision 2. Laparoscopic right oophorectomy 3.  Surgeon: Ardis Hughs, MD Resident Assistant: Chrisandra Netters First Assistant: Debbrah Alar PA-C  Anesthesia: General  Complications: None  Intraoperative findings:  There was a 2 cm firm mass at the distal ureter with proximal hydroureteronephrosis. This was excised along with the ureter.  As part of the dissection, the right ovary and ovarian artery was taken to allow excision of the pelvic mass. Given the need to take the artery, reactive to also take the ovary.  EBL: 250cc  Specimens:   #1: Right kidney #2: Right ureter and pelvic mass   #3: Right ovary  Indication: [name] is a 46 y.o. 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 Workup demonstrated a essentially nonfunctioning right kidney. In addition she was found to have a tumor on the lesser sac of the stomach. Dr. Clarise Cruz saw the patient and they opted to surgically excise this. Thus we decided to combine the 2 cases. The patient agreed to proceed after we explained to her the risks and benefits of the operation..  After reviewing the management options for treatment, he elected to proceed with the above surgical procedure(s). We have discussed the potential benefits and risks of the procedure, side effects of the proposed treatment, the likelihood of the patient achieving the goals of the procedure, and any potential problems that might occur during the procedure or recuperation. Informed consent has been obtained.  Description of procedure:  Consent was obtained the preoperative Foley area. She is a brought  back to the operating room placed on the table in supine position. Gen. anesthesia was then induced and timeout held.  Dr. Clarise Cruz then proceeded to perform his part of the operation, please see his note for further details.  I then assumed care of the patient and we began by closing the most lateral left port. We were able to use the remaining ports to open the posterior peritoneum and identified the right ureter. This was dissected down into the pelvis to the ureteral mass. The area was then dissected out and the ureter was transected distally. During the dissection, the artery to the ovary also appeared to be feeding this area extrinsic to the ureter where the mass was. As such, this artery was taken, and the ovary was then also freed and brought with the specimen. We then closed several of the additional ports including the ports on the left side of her abdomen and 12 mm port placed in the right mid abdomen by Dr. Clarise Cruz. We placed a Betadine impregnated last to grab on her abdomen and repositioned her into the lateral decubitus position. We were able to use the trocar that was right of her umbilicus. This was converted to a 12 mm port.  This was placed using a standard open Hassan technique which allowed entry into the peritoneal cavity under direct vision and without difficulty. A 12 mm Hassan cannula was placed and a pneumoperitoneum established. The camera was then used to inspect the abdomen and there was no evidence of any intra-abdominal injuries or other abnormalities. The remaining abdominal ports were then placed under visual guidance.  A second 12 mm port was placed in  the right lower quadrant approximately 8 cm away from the camera. A 5 mm port was placed in the right upper quadrant again 8 cm away from the camera. A second 5 mm port was placed just inferior to the xiphoid process in the midline, this was used as a liver retractor.  An additional 5 mm port was then placed in the right  lower quadrant at the anterior axillary line lateral to the 12 mm port. All ports were placed under direct vision without difficulty.   The white line of Toldt was incised allowing the colon to be mobilized medially and the plane between the mesocolon and the anterior layer of Gerota's fascia to be developed and the kidney exposed. The ureter and gonadal vein were identified inferiorly and the ureter was lifted anteriorly off the psoas muscle. Dissection proceeded superiorly along the gonadal vein until the renal vein was identified. The renal hilum was then carefully isolated with a combination of blunt and sharp dissectiong allowing the renal arterial and venous structures to be separated and isolated.   The renal artery and vein were subsequently isolated and using a 60 mm vascular stapler they were taken in combination simultaneously. A second load was then fired along the adrenal vascular supply.   Gerota's fascia was intentionally entered superiorly and the space between the adrenal gland and the kidney was developed allowing the adrenal gland to be spared. The hepatorenal ligaments were divided.. The lateral and posterior attachements to the kidney were then divided. Once the kidney was free from its attachments  40cc of 0.25% rupivocaine was then injected into the right anterior axillary line b/w the iliac crest and the twelfth rib under laparoscopic guidance. The layer between the tranversus abdominus and the internal oblique was targeted.   The kidney/ureter specimen was then placed into a 15 mm Endocatch II retrieval bag. The renal hilum, liver, adrenal bed and gonadal vein areas were each inspected and hemostasis was ensured with the pneomperitoneal pressures lowered. Surgicel was then placed over the hilum.  The camera was then brought to the 12 mm port laterally and the camera port was removed and the fascia closed using a [place] needle with a 0 Vicryl.  All the ports were then removed  under visual guidance. The lateral 12 mm port and 5 mm port were then connected sharply with a 15 blade. Then opened this incision down to the external oblique fascia. We then spread the muscle fibers down to the internal oblique fascia which we then opened with cautery. These were then spread in all muscle spared and the posterior peritoneum was opened. The rectus muscle was pulled medially. The specimen was then removed through these incision. The internal oblique fascia was then closed with a 0 Vicryl in a running fashion. The external oblique fascia was then closed with a 0 PDS in a running fashion.  All incisions were injected with local anesthetic and reapproximated at the skin with 4-0 monocryl sutures. Dermabond was applied to the skin. The patient tolerated the procedure well and without complications and was transferred to the recovery unit in satisfactory condition.   Ardis Hughs, M.D.

## 2017-05-17 NOTE — Progress Notes (Signed)
Post-op note  Subjective: The patient is doing well.  Sleepy, with occasionally nausea likely from anesthesia.   Objective: Vital signs in last 24 hours: Temp:  [98 F (36.7 C)-98.3 F (36.8 C)] 98 F (36.7 C) (07/13 1548) Pulse Rate:  [84-102] 96 (07/13 1702) Resp:  [16-20] 18 (07/13 1702) BP: (132-156)/(78-89) 152/85 (07/13 1702) SpO2:  [95 %-100 %] 97 % (07/13 1702) Weight:  [85.7 kg (189 lb)] 85.7 kg (189 lb) (07/13 0544)  Intake/Output from previous day: No intake/output data recorded. Intake/Output this shift: Total I/O In: 5300 [P.O.:300; I.V.:4900; IV Piggyback:100] Out: 730 [Urine:400; Emesis/NG output:130; Blood:200]  Physical Exam:  General: Alert and oriented. Abdomen: Soft, Nondistended. Incisions: Clean and dry, dermabond.  GU: foley catheter to drainage, draining clear yellow urine.   Lab Results: No results for input(s): HGB, HCT in the last 72 hours.  Assessment/Plan: POD#0 s/p lap right nephrectomy, partial ureterectomy.   1) Continue to monitor   Jonna Clark, MD   LOS: 0 days   FILIPPOU, PAULINE L 05/17/2017, 5:45 PM

## 2017-05-17 NOTE — Transfer of Care (Signed)
Immediate Anesthesia Transfer of Care Note  Patient: [name]  Procedure(s) Performed: Procedure(s): LAPAROSCOPIC POSSIBLE OPEN REMOVAL OF STOMACH MASS (N/A) RIGHT RADICAL NEPHRECTOMY, URETERECTOMY (Right)  Patient Location: PACU  Anesthesia Type:General  Level of Consciousness:  sedated, patient cooperative and responds to stimulation  Airway & Oxygen Therapy:Patient Spontanous Breathing and Patient connected to face mask oxgen  Post-op Assessment:  Report given to PACU RN and Post -op Vital signs reviewed and stable  Post vital signs:  Reviewed and stable  Last Vitals:  Vitals:   05/17/17 0515  BP: 132/86  Pulse: 84  Resp: 16  Temp: 82.0 C    Complications: No apparent anesthesia complications

## 2017-05-18 ENCOUNTER — Encounter (HOSPITAL_COMMUNITY): Payer: Self-pay | Admitting: Surgery

## 2017-05-18 DIAGNOSIS — N261 Atrophy of kidney (terminal): Secondary | ICD-10-CM

## 2017-05-18 DIAGNOSIS — T4145XA Adverse effect of unspecified anesthetic, initial encounter: Secondary | ICD-10-CM | POA: Diagnosis present

## 2017-05-18 DIAGNOSIS — M1990 Unspecified osteoarthritis, unspecified site: Secondary | ICD-10-CM | POA: Diagnosis present

## 2017-05-18 DIAGNOSIS — T8859XA Other complications of anesthesia, initial encounter: Secondary | ICD-10-CM | POA: Diagnosis present

## 2017-05-18 DIAGNOSIS — N2889 Other specified disorders of kidney and ureter: Secondary | ICD-10-CM

## 2017-05-18 DIAGNOSIS — K219 Gastro-esophageal reflux disease without esophagitis: Secondary | ICD-10-CM | POA: Diagnosis present

## 2017-05-18 LAB — CBC WITH DIFFERENTIAL/PLATELET
Basophils Absolute: 0 10*3/uL (ref 0.0–0.1)
Basophils Relative: 0 %
EOS ABS: 0 10*3/uL (ref 0.0–0.7)
EOS PCT: 0 %
HCT: 38.2 % (ref 36.0–46.0)
Hemoglobin: 12.5 g/dL (ref 12.0–15.0)
LYMPHS ABS: 1.1 10*3/uL (ref 0.7–4.0)
LYMPHS PCT: 6 %
MCH: 27.5 pg (ref 26.0–34.0)
MCHC: 32.7 g/dL (ref 30.0–36.0)
MCV: 84 fL (ref 78.0–100.0)
MONO ABS: 2.1 10*3/uL — AB (ref 0.1–1.0)
MONOS PCT: 11 %
Neutro Abs: 14.9 10*3/uL — ABNORMAL HIGH (ref 1.7–7.7)
Neutrophils Relative %: 83 %
Platelets: 392 10*3/uL (ref 150–400)
RBC: 4.55 MIL/uL (ref 3.87–5.11)
RDW: 13.8 % (ref 11.5–15.5)
WBC: 18 10*3/uL — AB (ref 4.0–10.5)

## 2017-05-18 MED ORDER — PANTOPRAZOLE SODIUM 40 MG PO TBEC
40.0000 mg | DELAYED_RELEASE_TABLET | Freq: Every day | ORAL | Status: DC
  Administered 2017-05-18 – 2017-05-24 (×6): 40 mg via ORAL
  Filled 2017-05-18 (×8): qty 1

## 2017-05-18 MED ORDER — SODIUM CHLORIDE 0.9% FLUSH
3.0000 mL | Freq: Two times a day (BID) | INTRAVENOUS | Status: DC
  Administered 2017-05-18 – 2017-05-24 (×10): 3 mL via INTRAVENOUS

## 2017-05-18 MED ORDER — BISACODYL 10 MG RE SUPP: 10.0000 mg | Freq: Two times a day (BID) | RECTAL | Status: DC | PRN

## 2017-05-18 MED ORDER — GABAPENTIN 300 MG PO CAPS
300.0000 mg | ORAL_CAPSULE | Freq: Every day | ORAL | Status: AC
  Administered 2017-05-19 – 2017-05-20 (×2): 300 mg via ORAL
  Filled 2017-05-18 (×3): qty 1

## 2017-05-18 MED ORDER — METHOCARBAMOL 500 MG PO TABS
1000.0000 mg | ORAL_TABLET | Freq: Four times a day (QID) | ORAL | Status: DC | PRN
  Administered 2017-05-18: 1000 mg via ORAL
  Filled 2017-05-18: qty 2

## 2017-05-18 MED ORDER — IPRATROPIUM-ALBUTEROL 0.5-2.5 (3) MG/3ML IN SOLN: 3.0000 mL | Freq: Four times a day (QID) | RESPIRATORY_TRACT | Status: DC | PRN

## 2017-05-18 MED ORDER — MENTHOL 3 MG MT LOZG: 1.0000 | LOZENGE | OROMUCOSAL | Status: DC | PRN

## 2017-05-18 MED ORDER — ACETAMINOPHEN 500 MG PO TABS
1000.0000 mg | ORAL_TABLET | Freq: Three times a day (TID) | ORAL | Status: DC
  Administered 2017-05-18 – 2017-05-19 (×4): 1000 mg via ORAL
  Filled 2017-05-18 (×5): qty 2

## 2017-05-18 MED ORDER — LACTATED RINGERS IV BOLUS (SEPSIS): 1000.0000 mL | Freq: Three times a day (TID) | INTRAVENOUS | Status: AC | PRN

## 2017-05-18 MED ORDER — HYDROCORTISONE 2.5 % RE CREA
1.0000 "application " | TOPICAL_CREAM | Freq: Four times a day (QID) | RECTAL | Status: DC | PRN
  Filled 2017-05-18: qty 28.35

## 2017-05-18 MED ORDER — ALUM & MAG HYDROXIDE-SIMETH 200-200-20 MG/5ML PO SUSP: 30.0000 mL | Freq: Four times a day (QID) | ORAL | Status: DC | PRN

## 2017-05-18 MED ORDER — IPRATROPIUM-ALBUTEROL 0.5-2.5 (3) MG/3ML IN SOLN
3.0000 mL | Freq: Four times a day (QID) | RESPIRATORY_TRACT | Status: DC
  Filled 2017-05-18: qty 3

## 2017-05-18 MED ORDER — PROCHLORPERAZINE EDISYLATE 5 MG/ML IJ SOLN: 5.0000 mg | INTRAMUSCULAR | Status: DC | PRN

## 2017-05-18 MED ORDER — POLYETHYLENE GLYCOL 3350 17 G PO PACK
17.0000 g | PACK | Freq: Two times a day (BID) | ORAL | Status: DC | PRN
  Administered 2017-05-22 – 2017-05-23 (×2): 17 g via ORAL
  Filled 2017-05-18: qty 1

## 2017-05-18 MED ORDER — GUAIFENESIN-DM 100-10 MG/5ML PO SYRP: 10.0000 mL | ORAL_SOLUTION | ORAL | Status: DC | PRN

## 2017-05-18 MED ORDER — SACCHAROMYCES BOULARDII 250 MG PO CAPS
250.0000 mg | ORAL_CAPSULE | Freq: Two times a day (BID) | ORAL | Status: DC
  Administered 2017-05-18 – 2017-05-24 (×9): 250 mg via ORAL
  Filled 2017-05-18 (×12): qty 1

## 2017-05-18 MED ORDER — LIP MEDEX EX OINT
1.0000 "application " | TOPICAL_OINTMENT | Freq: Two times a day (BID) | CUTANEOUS | Status: DC
  Administered 2017-05-18 – 2017-05-24 (×10): 1 via TOPICAL
  Filled 2017-05-18 (×2): qty 7

## 2017-05-18 MED ORDER — SODIUM CHLORIDE 0.9 % IV SOLN: 250.0000 mL | INTRAVENOUS | Status: DC | PRN

## 2017-05-18 MED ORDER — HYDROMORPHONE HCL-NACL 0.5-0.9 MG/ML-% IV SOSY: 0.5000 mg | PREFILLED_SYRINGE | INTRAVENOUS | Status: DC | PRN

## 2017-05-18 MED ORDER — SIMETHICONE 40 MG/0.6ML PO SUSP
40.0000 mg | Freq: Four times a day (QID) | ORAL | Status: DC | PRN
  Filled 2017-05-18: qty 0.6

## 2017-05-18 MED ORDER — MAGIC MOUTHWASH
15.0000 mL | Freq: Four times a day (QID) | ORAL | Status: DC | PRN
Start: 2017-05-18 — End: 2017-05-24
  Filled 2017-05-18: qty 15

## 2017-05-18 MED ORDER — SODIUM CHLORIDE 0.9% FLUSH: 3.0000 mL | INTRAVENOUS | Status: DC | PRN

## 2017-05-18 MED ORDER — OXYCODONE HCL 5 MG PO TABS
5.0000 mg | ORAL_TABLET | ORAL | Status: DC | PRN
  Administered 2017-05-18 – 2017-05-21 (×8): 10 mg via ORAL
  Filled 2017-05-18 (×9): qty 2

## 2017-05-18 MED ORDER — DIPHENHYDRAMINE HCL 50 MG/ML IJ SOLN: 12.5000 mg | Freq: Four times a day (QID) | INTRAMUSCULAR | Status: DC | PRN

## 2017-05-18 MED ORDER — METOCLOPRAMIDE HCL 5 MG/ML IJ SOLN: 5.0000 mg | Freq: Four times a day (QID) | INTRAMUSCULAR | Status: DC | PRN

## 2017-05-18 MED ORDER — HYDROCORTISONE 1 % EX CREA
1.0000 "application " | TOPICAL_CREAM | Freq: Three times a day (TID) | CUTANEOUS | Status: DC | PRN
  Filled 2017-05-18: qty 28

## 2017-05-18 MED ORDER — PHENOL 1.4 % MT LIQD: 1.0000 | OROMUCOSAL | Status: DC | PRN

## 2017-05-18 MED ORDER — PSYLLIUM 95 % PO PACK
1.0000 | PACK | Freq: Every day | ORAL | Status: DC
  Filled 2017-05-18 (×2): qty 1

## 2017-05-18 MED ORDER — METHOCARBAMOL 1000 MG/10ML IJ SOLN
1000.0000 mg | Freq: Four times a day (QID) | INTRAMUSCULAR | Status: DC | PRN
  Filled 2017-05-18: qty 10

## 2017-05-18 NOTE — Progress Notes (Signed)
1 Day Post-Op Subjective: Patient reports nausea, vomiting and incisional pain.  Objective: Vital signs in last 24 hours: Temp:  [98 F (36.7 C)-99.5 F (37.5 C)] 99.5 F (37.5 C) (07/14 0620) Pulse Rate:  [87-110] 110 (07/14 0620) Resp:  [16-20] 18 (07/14 0620) BP: (135-160)/(78-94) 160/84 (07/14 0620) SpO2:  [95 %-100 %] 97 % (07/14 0620)  Intake/Output from previous day: 07/13 0701 - 07/14 0700 In: 7086.7 [P.O.:300; I.V.:6386.7; IV Piggyback:400] Out: 4710 [Urine:4380; Emesis/NG output:130; Blood:200] Intake/Output this shift: No intake/output data recorded.  Physical Exam:  General:alert, cooperative and mild distress GI: not done and soft, with all incisions intact and no drainage.   Lab Results:  Recent Labs  05/18/17 0528  HGB 12.5  HCT 38.2   BMET No results for input(s): NA, K, CL, CO2, GLUCOSE, BUN, CREATININE, CALCIUM in the last 72 hours. No results for input(s): LABPT, INR in the last 72 hours. No results for input(s): LABURIN in the last 72 hours. No results found for this or any previous visit.  Studies/Results: No results found.  Assessment/Plan: Status post right nephrectomy and distal ureterectomy with excision of pelvic mass - she's doing relatively well postoperatively with some abdominal pain persisting is to be anticipated. She's also had some nausea. Her Foley catheter was removed this morning. Her H&H are stable.   1. BMP in a.m.  2. Diet per general surgery 3. Encouraged ambulation   LOS: 1 day   Joshawa Dubin C 05/18/2017, 8:16 AM

## 2017-05-18 NOTE — Progress Notes (Signed)
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 Narcotics?  Plan:  -change narcotics -max nonnarc pain control -low vol Bariatric type clear diet -f/u path -VTE prophylaxis- SCDs, etc -mobilize as tolerated to help recovery  20 minutes spent in review, evaluation, examination, counseling, and coordination of care.  More than 50% of that time was spent in counseling.  I updated the patient's status to the patient and spouse.  Recommendations were made.  Questions were answered.  They expressed understanding & appreciation.   Adin Hector, M.D., F.A.C.S. Gastrointestinal and Minimally Invasive Surgery [facility] [address] [place], [place] [address] [phone] Main / Paging   05/18/2017    Subjective: (Chief complaint)  N/V last night - "I usually have problems w anesthesia" Sore Husband in room Foley kinked? - d/c'd &  voiding fine Feels bloated  Objective:  Vital signs:  Vitals:   05/17/17 1702 05/17/17 2029 05/18/17 0132 05/18/17 0620  BP: (!) 152/85 (!) 154/85 (!) 155/94 (!) 160/84  Pulse: 96 98 (!) 109 (!) 110  Resp: 18 18 18 18   Temp:  98.9 F (37.2 C) 99.3 F (37.4 C) 99.5 F (37.5 C)  TempSrc:  Oral Oral Oral  SpO2: 97% 99% 96% 97%  Weight:      Height:        Last BM Date: 05/17/17  Intake/Output   Yesterday:  07/13 0701 - 07/14 0700 In: 7086.7 [P.O.:300; I.V.:6386.7; IV Piggyback:400] Out: 4710 [Urine:4380; Emesis/NG output:130; Blood:200] This shift:  No intake/output data recorded.  Bowel function:  Flatus: YES  BM:  No  Drain: (No drain)   Physical Exam:  General: Pt awake/alert/oriented x4 in mild acute distress (sore).  Not toxic/sickly Eyes: PERRL, normal EOM.  Sclera clear.  No icterus Neuro: CN II-XII intact w/o focal sensory/motor deficits. Lymph: No head/neck/groin lymphadenopathy Psych:  No delerium/psychosis/paranoia HENT: Normocephalic, Mucus membranes moist.  No thrush Neck: Supple, No tracheal deviation Chest: No chest wall pain w good excursion CV:  Pulses intact.  Regular rhythm MS: Normal AROM mjr joints.  No obvious deformity  Abdomen: Somewhat firm.  Mildy distended.  Mildly tender at incisions only.  No evidence of peritonitis.  No incarcerated hernias.  Ext:  No deformity.  No mjr edema.  No cyanosis Skin: No petechiae / purpura  Results:   Labs: Results for orders placed or performed during the hospital encounter of 05/17/17 (from the past 48 hour(s))  CBC WITH DIFFERENTIAL     Status: Abnormal   Collection Time: 05/18/17  5:28 AM  Result Value Ref Range   WBC 18.0 (H) 4.0 - 10.5 K/uL   RBC 4.55 3.87 - 5.11 MIL/uL   Hemoglobin 12.5 12.0 - 15.0 g/dL   HCT 38.2 36.0 - 46.0 %   MCV 84.0 78.0 - 100.0 fL   MCH 27.5 26.0 - 34.0 pg   MCHC 32.7 30.0 - 36.0 g/dL   RDW 13.8 11.5 - 15.5 %   Platelets 392 150 - 400 K/uL   Neutrophils  Relative % 83 %   Neutro Abs 14.9 (H) 1.7 - 7.7 K/uL   Lymphocytes Relative 6 %   Lymphs Abs 1.1 0.7 - 4.0 K/uL   Monocytes Relative 11 %   Monocytes Absolute 2.1 (H) 0.1 - 1.0 K/uL   Eosinophils Relative 0 %   Eosinophils Absolute 0.0 0.0 - 0.7 K/uL   Basophils Relative 0 %   Basophils Absolute 0.0 0.0 - 0.1 K/uL    Imaging / Studies: No results found.  Medications / Allergies: per chart  Antibiotics: Anti-infectives    Start     Dose/Rate Route Frequency Ordered Stop   05/17/17 0600  vancomycin (VANCOCIN) IVPB 1000 mg/200 mL premix     1,000 mg 200 mL/hr over 60 Minutes Intravenous 60 min pre-op 05/17/17 0515 05/17/17 0759   05/17/17 0530  clindamycin (CLEOCIN) IVPB 900 mg     900 mg 100 mL/hr over 30 Minutes Intravenous  Once 05/17/17 0515 05/17/17 0835        Note: Portions of this report may have been transcribed using voice recognition software. Every effort was made to ensure accuracy; however, inadvertent computerized transcription errors may be present.   Any transcriptional errors that result from this process are unintentional.     Adin Hector, M.D., F.A.C.S. Gastrointestinal and Minimally Invasive Surgery [facility] [address] [place], [place] [address] [phone] Main / Paging   05/18/2017

## 2017-05-19 LAB — CBC
HCT: 38.8 % (ref 36.0–46.0)
HEMOGLOBIN: 12.5 g/dL (ref 12.0–15.0)
MCH: 27.4 pg (ref 26.0–34.0)
MCHC: 32.2 g/dL (ref 30.0–36.0)
MCV: 85.1 fL (ref 78.0–100.0)
PLATELETS: 380 10*3/uL (ref 150–400)
RBC: 4.56 MIL/uL (ref 3.87–5.11)
RDW: 13.9 % (ref 11.5–15.5)
WBC: 20 10*3/uL — AB (ref 4.0–10.5)

## 2017-05-19 LAB — URINALYSIS, ROUTINE W REFLEX MICROSCOPIC
GLUCOSE, UA: NEGATIVE mg/dL
KETONES UR: 80 mg/dL — AB
NITRITE: NEGATIVE
PROTEIN: 30 mg/dL — AB
Specific Gravity, Urine: 1.026 (ref 1.005–1.030)
pH: 5 (ref 5.0–8.0)

## 2017-05-19 LAB — BASIC METABOLIC PANEL
ANION GAP: 11 (ref 5–15)
BUN: 8 mg/dL (ref 6–20)
CALCIUM: 8.6 mg/dL — AB (ref 8.9–10.3)
CO2: 23 mmol/L (ref 22–32)
Chloride: 102 mmol/L (ref 101–111)
Creatinine, Ser: 0.88 mg/dL (ref 0.44–1.00)
Glucose, Bld: 104 mg/dL — ABNORMAL HIGH (ref 65–99)
Potassium: 3.7 mmol/L (ref 3.5–5.1)
SODIUM: 136 mmol/L (ref 135–145)

## 2017-05-19 MED ORDER — ONDANSETRON HCL 4 MG/2ML IJ SOLN
4.0000 mg | INTRAMUSCULAR | Status: DC | PRN
  Administered 2017-05-19 – 2017-05-21 (×12): 4 mg via INTRAVENOUS
  Filled 2017-05-19 (×13): qty 2

## 2017-05-19 MED ORDER — METHOCARBAMOL 500 MG PO TABS
1000.0000 mg | ORAL_TABLET | Freq: Four times a day (QID) | ORAL | Status: AC
  Administered 2017-05-19 – 2017-05-20 (×6): 1000 mg via ORAL
  Filled 2017-05-19 (×7): qty 2

## 2017-05-19 MED ORDER — MORPHINE SULFATE (PF) 4 MG/ML IV SOLN
2.0000 mg | INTRAVENOUS | Status: DC | PRN
  Administered 2017-05-19: 2 mg via INTRAVENOUS
  Administered 2017-05-19 – 2017-05-20 (×4): 4 mg via INTRAVENOUS
  Administered 2017-05-20 – 2017-05-21 (×3): 2 mg via INTRAVENOUS
  Administered 2017-05-21: 4 mg via INTRAVENOUS
  Administered 2017-05-21: 2 mg via INTRAVENOUS
  Administered 2017-05-21: 4 mg via INTRAVENOUS
  Filled 2017-05-19 (×11): qty 1

## 2017-05-19 MED ORDER — ALBUTEROL SULFATE (2.5 MG/3ML) 0.083% IN NEBU: 3.0000 mL | INHALATION_SOLUTION | Freq: Four times a day (QID) | RESPIRATORY_TRACT | Status: DC | PRN

## 2017-05-19 MED ORDER — BISACODYL 10 MG RE SUPP
10.0000 mg | Freq: Every day | RECTAL | Status: DC
  Administered 2017-05-20 – 2017-05-24 (×4): 10 mg via RECTAL
  Filled 2017-05-19 (×5): qty 1

## 2017-05-19 MED ORDER — SULFAMETHOXAZOLE-TRIMETHOPRIM 800-160 MG PO TABS
1.0000 | ORAL_TABLET | Freq: Two times a day (BID) | ORAL | Status: DC
  Administered 2017-05-19 – 2017-05-24 (×9): 1 via ORAL
  Filled 2017-05-19 (×11): qty 1

## 2017-05-19 MED ORDER — IPRATROPIUM-ALBUTEROL 0.5-2.5 (3) MG/3ML IN SOLN: 3.0000 mL | RESPIRATORY_TRACT | Status: DC | PRN

## 2017-05-19 MED ORDER — MORPHINE SULFATE (PF) 4 MG/ML IV SOLN: 1.0000 mg | INTRAVENOUS | Status: DC | PRN

## 2017-05-19 MED ORDER — POLYETHYLENE GLYCOL 3350 17 G PO PACK
17.0000 g | PACK | Freq: Every day | ORAL | Status: DC
  Administered 2017-05-19 – 2017-05-24 (×4): 17 g via ORAL
  Filled 2017-05-19 (×6): qty 1

## 2017-05-19 NOTE — Progress Notes (Signed)
MD Alinda Money made aware of UA showing large leukocytes and many bacteria. MD to place orders.

## 2017-05-19 NOTE — Progress Notes (Signed)
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 Narcotics?  Plan:  -max nonnarc pain control -low vol pureed type clear diet for better flavoer options -anxiolysis -f/u path -VTE prophylaxis- SCDs, etc -mobilize as tolerated to help recovery  20 minutes spent in review, evaluation, examination, counseling, and coordination of care.  More than 50% of that time was spent in counseling.  I updated the patient's status to the patient and spouse.  D/w Dr Karsten Ro w Urology as well.  Recommendations were made.  Questions were answered.  They expressed understanding & appreciation.   Adin Hector, M.D., F.A.C.S. Gastrointestinal and Minimally Invasive Surgery [facility] [address] [place], [place] [address] [phone] Main / Paging   05/19/2017    Subjective: (Chief complaint)  Nausea w a few sips but no emesis "I usually have problems w anesthesia" Sore Husband in room Walked to nursing station.  Going to BR in room q2H  Objective:  Vital signs:  Vitals:   05/18/17 1419 05/18/17 2110 05/19/17 0146 05/19/17 0436  BP: (!) 163/77 (!) 165/85 (!) 158/92 127/86  Pulse: 95 (!) 107 (!) 106 (!) 122  Resp: '16 17 16 18  ' Temp: 98.5 F (36.9 C) 98.3 F (36.8 C) 98.9 F (37.2 C) 99.1 F (37.3 C)  TempSrc: Oral Oral Oral Axillary  SpO2: 98% 95% 96% 96%  Weight:      Height:        Last BM Date: 05/17/17  Intake/Output   Yesterday:  07/14 0701 - 07/15 0700 In: 1600 [P.O.:240; I.V.:1360] Out: 3775 [Urine:3775] This shift:  Total I/O In: -  Out: 100 [Urine:100]  Bowel function:  Flatus: YES - scant  BM:  No  Drain: (No drain)   Physical Exam:  General: Pt awake/alert/oriented x4 in mild acute distress (sore).  Not toxic/sickly Eyes: PERRL, normal EOM.  Sclera clear.  No icterus Neuro: CN II-XII intact w/o focal sensory/motor deficits. Lymph: No head/neck/groin lymphadenopathy Psych:  No delerium/psychosis/paranoia.  Anxious but consolable HENT: Normocephalic, Mucus membranes moist.  No thrush Neck: Supple, No tracheal deviation Chest: No chest wall pain w good excursion CV:  Pulses intact.  Regular rhythm MS: Normal AROM mjr joints.  No obvious deformity  Abdomen: Somewhat firm.  Mildy distended.  Mildly tender at incisions only.  No evidence of peritonitis.  No incarcerated hernias.  Ext:  No deformity.  No mjr  edema.  No cyanosis Skin: No petechiae / purpura  Results:   Labs: Results for orders placed or performed during the hospital encounter of 05/17/17 (from the past 48 hour(s))  CBC WITH DIFFERENTIAL     Status: Abnormal   Collection Time: 05/18/17  5:28 AM  Result Value Ref Range   WBC 18.0 (H) 4.0 - 10.5 K/uL    RBC 4.55 3.87 - 5.11 MIL/uL   Hemoglobin 12.5 12.0 - 15.0 g/dL   HCT 38.2 36.0 - 46.0 %   MCV 84.0 78.0 - 100.0 fL   MCH 27.5 26.0 - 34.0 pg   MCHC 32.7 30.0 - 36.0 g/dL   RDW 13.8 11.5 - 15.5 %   Platelets 392 150 - 400 K/uL   Neutrophils Relative % 83 %   Neutro Abs 14.9 (H) 1.7 - 7.7 K/uL   Lymphocytes Relative 6 %   Lymphs Abs 1.1 0.7 - 4.0 K/uL   Monocytes Relative 11 %   Monocytes Absolute 2.1 (H) 0.1 - 1.0 K/uL   Eosinophils Relative 0 %   Eosinophils Absolute 0.0 0.0 - 0.7 K/uL   Basophils Relative 0 %   Basophils Absolute 0.0 0.0 - 0.1 K/uL  Basic metabolic panel     Status: Abnormal   Collection Time: 05/19/17  5:11 AM  Result Value Ref Range   Sodium 136 135 - 145 mmol/L   Potassium 3.7 3.5 - 5.1 mmol/L   Chloride 102 101 - 111 mmol/L   CO2 23 22 - 32 mmol/L   Glucose, Bld 104 (H) 65 - 99 mg/dL   BUN 8 6 - 20 mg/dL   Creatinine, Ser 0.88 0.44 - 1.00 mg/dL   Calcium 8.6 (L) 8.9 - 10.3 mg/dL   GFR calc non Af Amer >60 >60 mL/min   GFR calc Af Amer >60 >60 mL/min    Comment: (NOTE) The eGFR has been calculated using the CKD EPI equation. This calculation has not been validated in all clinical situations. eGFR's persistently <60 mL/min signify possible Chronic Kidney Disease.    Anion gap 11 5 - 15  CBC     Status: Abnormal   Collection Time: 05/19/17  5:11 AM  Result Value Ref Range   WBC 20.0 (H) 4.0 - 10.5 K/uL   RBC 4.56 3.87 - 5.11 MIL/uL   Hemoglobin 12.5 12.0 - 15.0 g/dL   HCT 38.8 36.0 - 46.0 %   MCV 85.1 78.0 - 100.0 fL   MCH 27.4 26.0 - 34.0 pg   MCHC 32.2 30.0 - 36.0 g/dL   RDW 13.9 11.5 - 15.5 %   Platelets 380 150 - 400 K/uL    Imaging / Studies: No results found.  Medications / Allergies: per chart  Antibiotics: Anti-infectives    Start     Dose/Rate Route Frequency Ordered Stop   05/17/17 0600  vancomycin (VANCOCIN) IVPB 1000 mg/200 mL premix     1,000 mg 200 mL/hr over 60 Minutes Intravenous 60 min pre-op 05/17/17 0515 05/17/17 0759    05/17/17 0530  clindamycin (CLEOCIN) IVPB 900 mg     900 mg 100 mL/hr over 30 Minutes Intravenous  Once 05/17/17 0515 05/17/17 0835        Note: Portions of this report may have been transcribed using voice recognition software. Every effort was made to ensure accuracy; however, inadvertent computerized transcription errors may be present.   Any transcriptional errors that result from this process are unintentional.     Adin Hector, M.D., F.A.C.S.  Gastrointestinal and Minimally Invasive Surgery [facility] [address] [place], [place] [address] [phone] Main / Paging   05/19/2017

## 2017-05-19 NOTE — Progress Notes (Signed)
2 Days Post-Op Subjective: Patient reports some abdominal discomfort that has persisted (right side greater than left). It doesn't seem to be worsening. She reports nausea with clear liquid intake.   Objective: Vital signs in last 24 hours: Temp:  [98.3 F (36.8 C)-99.1 F (37.3 C)] 99.1 F (37.3 C) (07/15 0436) Pulse Rate:  [95-122] 122 (07/15 0436) Resp:  [16-18] 18 (07/15 0436) BP: (127-165)/(77-92) 127/86 (07/15 0436) SpO2:  [95 %-98 %] 96 % (07/15 0436)  Intake/Output from previous day: 07/14 0701 - 07/15 0700 In: 1600 [P.O.:240; I.V.:1360] Out: 3775 [Urine:3775] Intake/Output this shift: Total I/O In: -  Out: 100 [Urine:100]  Physical Exam:  General:alert, cooperative and mild distress GI: Abdomen is mildly obese. Soft with incisions that are free of any sign of infection. She is tender to palpation on the right side more than left with no mass noted.  Lab Results:  Recent Labs  05/18/17 0528 05/19/17 0511  HGB 12.5 12.5  HCT 38.2 38.8   BMET  Recent Labs  05/19/17 0511  NA 136  K 3.7  CL 102  CO2 23  GLUCOSE 104*  BUN 8  CREATININE 0.88  CALCIUM 8.6*   No results for input(s): LABPT, INR in the last 72 hours. No results for input(s): LABURIN in the last 72 hours. No results found for this or any previous visit.  Studies/Results: No results found.  Assessment/Plan: Status post right nephrectomy with ureterectomy and excision of pelvic mass: Overall she seems to be doing well from that objective standpoint. Her hemoglobin remained stable. There has been no change in her creatinine after removal of her kidney with a creatinine of 0.88 currently. Her white count is 20,000 with no fever. She is slow to advance her diet.  1. It appears she will need to remain on clear liquids with ultimate decision by general surgery. 2. Ambulate   LOS: 2 days   Chuong Casebeer C 05/19/2017, 8:50 AM

## 2017-05-19 NOTE — Progress Notes (Addendum)
Patient ID: [name], female   DOB: 09/02/1971, 46 y.o.   MRN: [id number]  I was called by the nursing staff tonight.  Her urine has been noted to be cloudy and odiferous. Considering her increasing WBC and tachycardia earlier today, will send UA and culture.     UA noted to be positive.  Pt persists in being tachycardic.  Confirmed she is take oral intake.  Allergies and normal renal function noted.  Will empirically start Bactrim pending culture results.

## 2017-05-19 NOTE — Plan of Care (Signed)
Problem: Food- and Nutrition-Related Knowledge Deficit (NB-1.1) Goal: Nutrition education Formal process to instruct or train a patient/client in a skill or to impart knowledge to help patients/clients voluntarily manage or modify food choices and eating behavior to maintain or improve health. Outcome: Completed/Met Date Met: 05/19/17 Nutrition Education Note  RD consulted for nutrition education regarding post gastrectomy diet.    RD provided "Gastric surgery Nutrition Therapy" handout from the [employer]. Reviewed liquid options with patient and encouraged patient to continue protein shakes at home to ensure adequate protein consumption.  Once diet is advanced to soft diet, advised patient to eat multiple small meals, avoid high fat and sugary foods, and avoid high fiber foods. Advised patient to chew foods thoroughly before swallowing, separate fluids from meals, wait at least 30 minutes after drinking prior to meals and don't drink anything 30 minutes after meals. Encouraged patient to sit up or walk after meals if possible. Use of daily chewable multivitamin encouraged.  Teach back method used.  Expect good compliance. Family present at bedside.  Body mass index is 36.91 kg/m. Pt meets criteria for obesity based on current BMI.  Current diet order is dysphagia 1 diet.  Labs and medications reviewed. No further nutrition interventions warranted at this time. If additional nutrition issues arise, please re-consult RD.  Clayton Bibles, MS, RD, LDN Pager: [phone] After Hours Pager: [phone]

## 2017-05-20 LAB — BASIC METABOLIC PANEL
Anion gap: 9 (ref 5–15)
BUN: 12 mg/dL (ref 6–20)
CO2: 24 mmol/L (ref 22–32)
Calcium: 8.5 mg/dL — ABNORMAL LOW (ref 8.9–10.3)
Chloride: 100 mmol/L — ABNORMAL LOW (ref 101–111)
Creatinine, Ser: 0.95 mg/dL (ref 0.44–1.00)
GFR calc Af Amer: >60 mL/min (ref >60)
GLUCOSE: 78 mg/dL (ref 65–99)
Potassium: 3.8 mmol/L (ref 3.5–5.1)
Sodium: 133 mmol/L — ABNORMAL LOW (ref 135–145)

## 2017-05-20 LAB — CBC
HEMATOCRIT: 35.5 % — AB (ref 36.0–46.0)
Hemoglobin: 11.6 g/dL — ABNORMAL LOW (ref 12.0–15.0)
MCH: 27.6 pg (ref 26.0–34.0)
MCHC: 32.7 g/dL (ref 30.0–36.0)
MCV: 84.3 fL (ref 78.0–100.0)
Platelets: 307 10*3/uL (ref 150–400)
RBC: 4.21 MIL/uL (ref 3.87–5.11)
RDW: 13.6 % (ref 11.5–15.5)
WBC: 16.2 10*3/uL — ABNORMAL HIGH (ref 4.0–10.5)

## 2017-05-20 MED ORDER — ACETAMINOPHEN 10 MG/ML IV SOLN
1000.0000 mg | Freq: Four times a day (QID) | INTRAVENOUS | Status: AC
  Administered 2017-05-20 – 2017-05-21 (×4): 1000 mg via INTRAVENOUS
  Filled 2017-05-20 (×4): qty 100

## 2017-05-20 MED ORDER — IPRATROPIUM-ALBUTEROL 0.5-2.5 (3) MG/3ML IN SOLN: 3.0000 mL | RESPIRATORY_TRACT | Status: DC | PRN

## 2017-05-20 NOTE — Progress Notes (Signed)
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 0.9 %  sodium chloride infusion  250 mL Intravenous PRN Michael Boston, MD      . acetaminophen (OFIRMEV) IV 1,000 mg  1,000 mg Intravenous Q6H Ardis Hughs, MD      . albuterol (PROVENTIL) (2.5 MG/3ML) 0.083% nebulizer solution 3 mL  3 mL Inhalation Q6H PRN Michael Boston, MD      . alum & mag hydroxide-simeth (MAALOX/MYLANTA) 200-200-20 MG/5ML suspension 30 mL  30 mL Oral Q6H PRN Michael Boston, MD      . bisacodyl (DULCOLAX) suppository 10 mg  10 mg Rectal Daily Michael Boston, MD      . diphenhydrAMINE (BENADRYL) injection 12.5-25 mg  12.5-25 mg Intravenous Q6H PRN Michael Boston, MD      . enoxaparin (LOVENOX) injection 30 mg  30 mg Subcutaneous Q12H Jackolyn Confer, MD   30 mg at 05/19/17 2141  . gabapentin (NEURONTIN) capsule 300 mg  300 mg Oral Ardeen Fillers, MD   300 mg at 05/19/17 2144  . guaiFENesin-dextromethorphan (ROBITUSSIN DM) 100-10 MG/5ML syrup 10 mL  10 mL Oral Q4H PRN Michael Boston, MD      . hydrocortisone (ANUSOL-HC) 2.5 % rectal cream 1 application  1 application Topical QID PRN Michael Boston, MD      . hydrocortisone cream 1 % 1 application  1 application Topical TID PRN Michael Boston, MD      . HYDROmorphone (DILAUDID) injection 0.5-2 mg  0.5-2 mg Intravenous Q1H PRN Michael Boston, MD      . ipratropium-albuterol (DUONEB) 0.5-2.5 (3) MG/3ML nebulizer solution 3 mL  3 mL Nebulization Q4H PRN Michael Boston, MD      . lactated ringers bolus 1,000 mL  1,000 mL Intravenous Q8H PRN Gross, Remo Lipps, MD      . lip balm (CARMEX) ointment 1 application  1 application Topical BID Michael Boston, MD   1 application at 70/62/37 2200  . magic mouthwash  15 mL Oral QID PRN Michael Boston, MD      . menthol-cetylpyridinium (CEPACOL) lozenge 3 mg  1 lozenge Oral PRN Michael Boston, MD      . methocarbamol (ROBAXIN) 1,000 mg in dextrose 5 % 50 mL IVPB  1,000 mg Intravenous Q6H PRN Michael Boston, MD      . methocarbamol (ROBAXIN) tablet 1,000 mg  1,000 mg Oral QID Michael Boston, MD   1,000 mg at 05/19/17 2142  . metoCLOPramide (REGLAN) injection 5-10 mg  5-10 mg Intravenous Q6H PRN Michael Boston, MD      . morphine 4 MG/ML injection 2-4 mg  2-4 mg Intravenous Q2H PRN Michael Boston, MD   4 mg at 05/20/17 0450  . ondansetron (ZOFRAN) injection 4-8 mg  4-8 mg Intravenous Q4H PRN Michael Boston, MD   4 mg at 05/20/17 0450  .  oxyCODONE (Oxy IR/ROXICODONE) immediate release tablet 5-10 mg  5-10 mg Oral Q4H PRN Michael Boston, MD   10 mg at 05/19/17 2006  . pantoprazole (PROTONIX) EC tablet 40 mg  40 mg Oral Q1200 Michael Boston, MD   40 mg at 05/19/17 1157  . phenol (CHLORASEPTIC) mouth spray 1-2 spray  1-2 spray Mouth/Throat PRN Michael Boston, MD      . polyethylene glycol (MIRALAX / GLYCOLAX) packet 17 g  17 g Oral Q12H PRN Michael Boston, MD      . polyethylene glycol (MIRALAX / GLYCOLAX) packet 17 g  17 g Oral Daily Michael Boston, MD   17 g at 05/19/17 0926  . prochlorperazine (COMPAZINE) injection 5-10 mg  5-10 mg Intravenous Q4H PRN Michael Boston, MD      . protein supplement (PREMIER PROTEIN) liquid  2 oz Oral Q2H Jackolyn Confer, MD   2 oz at 05/19/17 1600  . saccharomyces boulardii (FLORASTOR)  capsule 250 mg  250 mg Oral BID Michael Boston, MD   250 mg at 05/19/17 2144  . simethicone (MYLICON) 40 CZ/6.6AY suspension 40 mg  40 mg Oral QID PRN Michael Boston, MD      . sodium chloride flush (NS) 0.9 % injection 3 mL  3 mL Intravenous Gorden Harms, MD   3 mL at 05/19/17 2200  . sodium chloride flush (NS) 0.9 % injection 3 mL  3 mL Intravenous PRN Michael Boston, MD      . sulfamethoxazole-trimethoprim (BACTRIM DS,SEPTRA DS) 800-160 MG per tablet 1 tablet  1 tablet Oral Q12H Raynelle Bring, MD   1 tablet at 05/19/17 2143     Objective: Vital: Vitals:   05/19/17 1312 05/19/17 1924 05/20/17 0118 05/20/17 0450  BP: 99/75 129/78 126/77 131/72  Pulse: (!) 136 (!) 117 (!) 109 (!) 107  Resp: 18 17 16 18   Temp: 98.1 F (36.7 C) 98.3 F (36.8 C) 98.4 F (36.9 C) 98.1 F (36.7 C)  TempSrc: Axillary Axillary Oral Oral  SpO2: 97% 95% 96% 98%  Weight:      Height:       I/Os: I/O last 3 completed shifts: In: 1213.3 [P.O.:600; I.V.:613.3] Out: 1975 [TKZSW:1093]  Physical Exam:  General: Patient is in no apparent distress Lungs: Normal respiratory effort, chest expands symmetrically. GI: Incisions are c/d/i. Some bruising around her incisions, the abdomen is soft and appropriately tender Ext: lower extremities symmetric  Lab Results:  Recent Labs  05/18/17 0528 05/19/17 0511 05/20/17 0446  WBC 18.0* 20.0* 16.2*  HGB 12.5 12.5 11.6*  HCT 38.2 38.8 35.5*    Recent Labs  05/19/17 0511 05/20/17 0446  NA 136 133*  K 3.7 3.8  CL 102 100*  CO2 23 24  GLUCOSE 104* 78  BUN 8 12  CREATININE 0.88 0.95  CALCIUM 8.6* 8.5*   No results for input(s): LABPT, INR in the last 72 hours. No results for input(s): LABURIN in the last 72 hours. No results found for this or any previous visit.  Studies/Results: No results found.  Assessment: Procedure(s): LAPAROSCOPIC POSSIBLE OPEN REMOVAL OF STOMACH MASS RIGHT RADICAL NEPHRECTOMY, URETERECTOMY, 3 Days Post-Op  Slow to  progress - likely has a bit of an post-op ileus, nausea and poor pain control.  Started on empiric abx for UTI.  Plan: IV Tylenol - non-narcotic to help control pain and allow better ambulation. She needs to be up out of bed today and ambulating more. Continue Abx and f/u urine culture. Diet per GS, slow advance  given nausea and ileus picture.    Louis Meckel, MD Urology 05/20/2017, 7:35 AM

## 2017-05-20 NOTE — Care Management Note (Signed)
Case Management Note  Patient Details  Name: [name] MRN: [id number] Date of Birth: May 26, 1971  Subjective/Objective:  46 y/o f admitted w/Gastric tumor. From home.                  Action/Plan:d/c plan home.   Expected Discharge Date:                  Expected Discharge Plan:  Home/Self Care  In-House Referral:     Discharge planning Services  CM Consult  Post Acute Care Choice:    Choice offered to:     DME Arranged:    DME Agency:     HH Arranged:    HH Agency:     Status of Service:  In process, will continue to follow  If discussed at Long Length of Stay Meetings, dates discussed:    Additional Comments:  Dessa Phi, RN 05/20/2017, 1:09 PM

## 2017-05-20 NOTE — Progress Notes (Signed)
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   LOS: 3 days     3 Days Post-Op  Chief Complaint/Subjective: No BM or flatus.  Some nausea and dry heaves.    Objective: Vital signs in last 24 hours: Temp:  [98.1 F (36.7 C)-98.4 F (36.9 C)] 98.1 F (36.7 C) (07/16 0450) Pulse Rate:  [107-136] 107 (07/16 0450) Resp:  [16-18] 18 (07/16 0450) BP: (99-131)/(72-78) 131/72 (07/16 0450) SpO2:  [95 %-98 %] 98 % (07/16 0450) Last BM Date: 05/17/17  Intake/Output from previous day: 07/15 0701 - 07/16 0700 In: 720 [P.O.:720] Out: 800 [Urine:800] Intake/Output this shift: No intake/output data recorded.  PE: General- In NAD.  Awake and alert. Abdomen-soft, wounds clean and intact with some surrounding bruising, few bowel sounds  Lab Results:   Recent Labs  05/19/17 0511 05/20/17 0446  WBC 20.0* 16.2*  HGB 12.5 11.6*  HCT 38.8 35.5*  PLT 380 307   BMET  Recent Labs  05/19/17 0511 05/20/17 0446  NA 136 133*  K 3.7 3.8  CL 102 100*  CO2 23 24  GLUCOSE 104* 78  BUN 8 12  CREATININE 0.88 0.95  CALCIUM 8.6* 8.5*   PT/INR No results for input(s): LABPROT, INR in the last 72 hours. Comprehensive Metabolic Panel:    Component Value Date/Time   NA 133 (L) 05/20/2017 0446   NA 136 05/19/2017 0511   K 3.8 05/20/2017 0446   K 3.7 05/19/2017 0511   CL 100 (L) 05/20/2017 0446   CL 102 05/19/2017 0511   CO2 24 05/20/2017 0446   CO2 23 05/19/2017 0511   BUN 12 05/20/2017 0446   BUN 8 05/19/2017 0511   CREATININE 0.95 05/20/2017 0446   CREATININE 0.88 05/19/2017 0511   GLUCOSE 78 05/20/2017 0446   GLUCOSE 104 (H) 05/19/2017 0511   CALCIUM 8.5 (L) 05/20/2017 0446   CALCIUM 8.6 (L) 05/19/2017  0511   AST 22 05/14/2017 1100   AST 25 12/25/2016 1614   ALT 31 05/14/2017 1100   ALT 32 12/25/2016 1614   ALKPHOS 76 05/14/2017 1100   ALKPHOS 81 12/25/2016 1614   BILITOT 0.3 05/14/2017 1100   BILITOT <0.1 (L) 12/25/2016 1614   PROT 7.1 05/14/2017 1100   PROT 6.5 12/25/2016 1614   ALBUMIN 4.1 05/14/2017 1100   ALBUMIN 3.7 12/25/2016 1614     Studies/Results: No results found.  Anti-infectives: Anti-infectives    Start     Dose/Rate Route Frequency Ordered Stop   05/19/17 2200  sulfamethoxazole-trimethoprim (BACTRIM DS,SEPTRA DS) 800-160 MG per tablet 1 tablet     1 tablet Oral Every 12 hours 05/19/17 1949     05/17/17 0600  vancomycin (VANCOCIN) IVPB 1000 mg/200 mL premix     1,000 mg 200 mL/hr over 60 Minutes Intravenous 60 min pre-op 05/17/17 0515 05/17/17 0759   05/17/17 0530  clindamycin (CLEOCIN) IVPB 900 mg     900 mg 100 mL/hr over 30 Minutes Intravenous  Once 05/17/17 0515 05/17/17 0835       Shayan Bramhall J 05/20/2017

## 2017-05-20 NOTE — Progress Notes (Signed)
3 Days Post-Op Subjective: Patient reports some abdominal discomfort that has persisted (right side greater than left). Distended, has not yet passed gas. Having nausea with clear intake. Denies new pains. Ambulated x2 yesterday.   Objective: Vital signs in last 24 hours: Temp:  [98.1 F (36.7 C)-98.4 F (36.9 C)] 98.1 F (36.7 C) (07/16 0450) Pulse Rate:  [107-136] 107 (07/16 0450) Resp:  [16-18] 18 (07/16 0450) BP: (99-131)/(72-78) 131/72 (07/16 0450) SpO2:  [95 %-98 %] 98 % (07/16 0450)  Intake/Output from previous day: 07/15 0701 - 07/16 0700 In: 600 [P.O.:600] Out: 800 [Urine:800] Intake/Output this shift: No intake/output data recorded.  Physical Exam:  General:alert, cooperative and mild distress GI: Abdomen is mildly obese. Soft with incisions that are free of any sign of infection. She is tender to palpation on the right side more than left with no mass noted. Slightly distended non tympanitic.   Lab Results:  Recent Labs  05/18/17 0528 05/19/17 0511 05/20/17 0446  HGB 12.5 12.5 11.6*  HCT 38.2 38.8 35.5*   BMET  Recent Labs  05/19/17 0511 05/20/17 0446  NA 136 133*  K 3.7 3.8  CL 102 100*  CO2 23 24  GLUCOSE 104* 78  BUN 8 12  CREATININE 0.88 0.95  CALCIUM 8.6* 8.5*   No results for input(s): LABPT, INR in the last 72 hours. No results for input(s): LABURIN in the last 72 hours. No results found for this or any previous visit.  Studies/Results: No results found.  Assessment/Plan: Status post right nephrectomy with ureterectomy and excision of pelvic mass: Overall she seems to be doing well from that objective standpoint. Her hemoglobin remained stable. There has been no change in her creatinine after removal of her kidney with a creatinine of 0.88 currently. Leukocytosis and tachycardia likely due to a UTI after cathed UA obtained yesterday concerning for infection. Urine culture pending, started on Bacrtim prophylactically.   1. It appears she  will need to remain on clear liquids with ultimate decision by general surgery. 2. Ambulate 3. Follow up urine culture, switch antibiotics based on sensitivities. Total course 7 days.    LOS: 3 days   FILIPPOU, PAULINE L 05/20/2017, 7:32 AM

## 2017-05-21 LAB — BASIC METABOLIC PANEL
Anion gap: 8 (ref 5–15)
BUN: 9 mg/dL (ref 6–20)
CHLORIDE: 100 mmol/L — AB (ref 101–111)
CO2: 25 mmol/L (ref 22–32)
Calcium: 8.3 mg/dL — ABNORMAL LOW (ref 8.9–10.3)
Creatinine, Ser: 0.85 mg/dL (ref 0.44–1.00)
GFR calc Af Amer: >60 mL/min (ref >60)
GFR calc non Af Amer: >60 mL/min (ref >60)
GLUCOSE: 107 mg/dL — AB (ref 65–99)
POTASSIUM: 3.6 mmol/L (ref 3.5–5.1)
Sodium: 133 mmol/L — ABNORMAL LOW (ref 135–145)

## 2017-05-21 LAB — CBC
HEMATOCRIT: 34.6 % — AB (ref 36.0–46.0)
Hemoglobin: 11.1 g/dL — ABNORMAL LOW (ref 12.0–15.0)
MCH: 26.7 pg (ref 26.0–34.0)
MCHC: 32.1 g/dL (ref 30.0–36.0)
MCV: 83.2 fL (ref 78.0–100.0)
Platelets: 327 10*3/uL (ref 150–400)
RBC: 4.16 MIL/uL (ref 3.87–5.11)
RDW: 13.5 % (ref 11.5–15.5)
WBC: 12.9 10*3/uL — ABNORMAL HIGH (ref 4.0–10.5)

## 2017-05-21 MED ORDER — ACETAMINOPHEN 500 MG PO TABS
1000.0000 mg | ORAL_TABLET | Freq: Four times a day (QID) | ORAL | Status: DC
  Administered 2017-05-21 (×2): 1000 mg via ORAL
  Filled 2017-05-21 (×3): qty 2

## 2017-05-21 MED ORDER — LIDOCAINE 5 % EX PTCH
1.0000 | MEDICATED_PATCH | CUTANEOUS | Status: DC
  Administered 2017-05-22 – 2017-05-23 (×2): 1 via TRANSDERMAL
  Filled 2017-05-21 (×4): qty 1

## 2017-05-21 NOTE — Progress Notes (Signed)
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   LOS: 4 days     4 Days Post-Op  Chief Complaint/Subjective: Passing gas.  Still has nausea and incisional pain.  Objective: Vital signs in last 24 hours: Temp:  [97.8 F (36.6 C)-98.3 F (36.8 C)] 98.2 F (36.8 C) (07/17 0722) Pulse Rate:  [94-107] 96 (07/17 0722) Resp:  [16-19] 16 (07/17 0722) BP: (125-142)/(67-80) 131/67 (07/17 0722) SpO2:  [97 %] 97 % (07/17 0722) Last BM Date: 05/17/17  Intake/Output from previous day: 07/16 0701 - 07/17 0700 In: 760 [P.O.:460; IV Piggyback:300] Out: 2650 [Urine:2650] Intake/Output this shift: No intake/output data recorded.  PE: General- In NAD.  Awake and alert. Abdomen-soft, wounds clean and intact with some surrounding bruising, more active bowel sounds today  Lab Results:   Recent Labs  05/20/17 0446 05/21/17 0453  WBC 16.2* 12.9*  HGB 11.6* 11.1*  HCT 35.5* 34.6*  PLT 307 327   BMET  Recent Labs  05/20/17 0446 05/21/17 0453  NA 133* 133*  K 3.8 3.6  CL 100* 100*  CO2 24 25  GLUCOSE 78 107*  BUN 12 9  CREATININE 0.95 0.85  CALCIUM 8.5* 8.3*   PT/INR No results for input(s): LABPROT, INR in the last 72 hours. Comprehensive Metabolic Panel:    Component Value Date/Time   NA 133 (L) 05/21/2017 0453   NA 133 (L) 05/20/2017 0446   K 3.6 05/21/2017 0453   K 3.8 05/20/2017 0446   CL 100 (L) 05/21/2017 0453   CL 100 (L) 05/20/2017 0446   CO2 25 05/21/2017 0453   CO2 24 05/20/2017 0446   BUN 9 05/21/2017 0453   BUN 12 05/20/2017 0446   CREATININE 0.85  05/21/2017 0453   CREATININE 0.95 05/20/2017 0446   GLUCOSE 107 (H) 05/21/2017 0453   GLUCOSE 78 05/20/2017 0446   CALCIUM 8.3 (L) 05/21/2017 0453   CALCIUM 8.5 (L) 05/20/2017 0446   AST 22 05/14/2017 1100   AST 25 12/25/2016 1614   ALT 31 05/14/2017 1100   ALT 32 12/25/2016 1614   ALKPHOS 76 05/14/2017 1100   ALKPHOS 81 12/25/2016 1614   BILITOT 0.3 05/14/2017 1100   BILITOT <0.1 (L) 12/25/2016 1614   PROT 7.1 05/14/2017 1100   PROT 6.5 12/25/2016 1614   ALBUMIN 4.1 05/14/2017 1100   ALBUMIN 3.7 12/25/2016 1614     Studies/Results: No results found.  Anti-infectives: Anti-infectives    Start     Dose/Rate Route Frequency Ordered Stop   05/19/17 2200  sulfamethoxazole-trimethoprim (BACTRIM DS,SEPTRA DS) 800-160 MG per tablet 1 tablet     1 tablet Oral Every 12 hours 05/19/17 1949     05/17/17 0600  vancomycin (VANCOCIN) IVPB 1000 mg/200 mL premix     1,000 mg 200 mL/hr over 60 Minutes Intravenous 60 min pre-op 05/17/17 0515 05/17/17 0759   05/17/17 0530  clindamycin (CLEOCIN) IVPB 900 mg     900 mg 100 mL/hr over 30 Minutes Intravenous  Once 05/17/17 0515 05/17/17 0835       [name] J 05/21/2017

## 2017-05-21 NOTE — Progress Notes (Signed)
4 Days Post-Op Subjective: Ongoing pain and nausea particularly with medications. Passed flatus, no BM. Ambulated x3 yesterday.    Objective: Vital signs in last 24 hours: Temp:  [97.8 F (36.6 C)-98.3 F (36.8 C)] 98.2 F (36.8 C) (07/17 0722) Pulse Rate:  [94-107] 96 (07/17 0722) Resp:  [16-19] 16 (07/17 0722) BP: (125-142)/(67-80) 131/67 (07/17 0722) SpO2:  [97 %] 97 % (07/17 0722)  Intake/Output from previous day: 07/16 0701 - 07/17 0700 In: 760 [P.O.:460; IV Piggyback:300] Out: 2650 [Urine:2650] Intake/Output this shift: No intake/output data recorded.  Physical Exam:  General:alert, cooperative and mild distress GI: Abdomen is mildly obese. Soft with incisions that are free of any sign of infection. She is tender to palpation on the right side more than left with no mass noted.   Lab Results:  Recent Labs  05/19/17 0511 05/20/17 0446 05/21/17 0453  HGB 12.5 11.6* 11.1*  HCT 38.8 35.5* 34.6*   BMET  Recent Labs  05/20/17 0446 05/21/17 0453  NA 133* 133*  K 3.8 3.6  CL 100* 100*  CO2 24 25  GLUCOSE 78 107*  BUN 12 9  CREATININE 0.95 0.85  CALCIUM 8.5* 8.3*   No results for input(s): LABPT, INR in the last 72 hours. No results for input(s): LABURIN in the last 72 hours. No results found for this or any previous visit.  Studies/Results: No results found.  Assessment/Plan: 46 yo POD4 s/p radical lap nephrectomy. Cr, hgb stable. Leukocytosis and tachycardia has improved since starting Bactrim for likely UTI.  1. Regular diet, med lock 2. Continue ambulation 3. Scheduled tylenol, lidocaine patches for pain.  4. Follow up urine culture, switch antibiotics based on sensitivities. Total course 7 days.    LOS: 4 days   FILIPPOU, PAULINE L 05/21/2017, 7:48 AM

## 2017-05-21 NOTE — Progress Notes (Signed)
Resumed care of pt. Agree with previous RN's assessment. Will continue to monitor and follow plan of care for pt.  

## 2017-05-22 ENCOUNTER — Inpatient Hospital Stay (HOSPITAL_COMMUNITY): Payer: PRIVATE HEALTH INSURANCE

## 2017-05-22 LAB — CBC
HEMATOCRIT: 35.2 % — AB (ref 36.0–46.0)
Hemoglobin: 11.3 g/dL — ABNORMAL LOW (ref 12.0–15.0)
MCH: 26.5 pg (ref 26.0–34.0)
MCHC: 32.1 g/dL (ref 30.0–36.0)
MCV: 82.6 fL (ref 78.0–100.0)
PLATELETS: 342 10*3/uL (ref 150–400)
RBC: 4.26 MIL/uL (ref 3.87–5.11)
RDW: 13.6 % (ref 11.5–15.5)
WBC: 10.2 10*3/uL (ref 4.0–10.5)

## 2017-05-22 LAB — BASIC METABOLIC PANEL
Anion gap: 8 (ref 5–15)
BUN: 10 mg/dL (ref 6–20)
CHLORIDE: 101 mmol/L (ref 101–111)
CO2: 27 mmol/L (ref 22–32)
CREATININE: 0.77 mg/dL (ref 0.44–1.00)
Calcium: 8.3 mg/dL — ABNORMAL LOW (ref 8.9–10.3)
GFR calc Af Amer: >60 mL/min (ref >60)
GFR calc non Af Amer: >60 mL/min (ref >60)
GLUCOSE: 107 mg/dL — AB (ref 65–99)
POTASSIUM: 3.3 mmol/L — AB (ref 3.5–5.1)
SODIUM: 136 mmol/L (ref 135–145)

## 2017-05-22 LAB — URINE CULTURE

## 2017-05-22 MED ORDER — BOOST / RESOURCE BREEZE PO LIQD: 1.0000 | Freq: Two times a day (BID) | ORAL | Status: DC

## 2017-05-22 MED ORDER — UNJURY CHICKEN SOUP POWDER
8.0000 [oz_av] | Freq: Two times a day (BID) | ORAL | Status: DC
  Administered 2017-05-22: 8 [oz_av] via ORAL
  Filled 2017-05-22 (×5): qty 27

## 2017-05-22 MED ORDER — POTASSIUM CHLORIDE 10 MEQ/100ML IV SOLN
10.0000 meq | INTRAVENOUS | Status: AC
  Administered 2017-05-22 (×3): 10 meq via INTRAVENOUS
  Filled 2017-05-22 (×3): qty 100

## 2017-05-22 MED ORDER — ACETAMINOPHEN 10 MG/ML IV SOLN
1000.0000 mg | Freq: Four times a day (QID) | INTRAVENOUS | Status: AC
  Administered 2017-05-22 (×2): 1000 mg via INTRAVENOUS
  Filled 2017-05-22 (×4): qty 100

## 2017-05-22 MED ORDER — ADULT MULTIVITAMIN LIQUID CH
15.0000 mL | Freq: Every day | ORAL | Status: DC
  Filled 2017-05-22 (×3): qty 15

## 2017-05-22 MED ORDER — KCL IN DEXTROSE-NACL 20-5-0.9 MEQ/L-%-% IV SOLN
INTRAVENOUS | Status: DC
  Administered 2017-05-22 (×2): via INTRAVENOUS
  Filled 2017-05-22 (×4): qty 1000

## 2017-05-22 MED ORDER — METOCLOPRAMIDE HCL 5 MG/ML IJ SOLN
10.0000 mg | Freq: Four times a day (QID) | INTRAMUSCULAR | Status: DC
  Administered 2017-05-22 – 2017-05-24 (×9): 10 mg via INTRAVENOUS
  Filled 2017-05-22 (×9): qty 2

## 2017-05-22 NOTE — Progress Notes (Signed)
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   LOS: 5 days     5 Days Post-Op  Chief Complaint/Subjective: Had episodes of n/v after pureed diet last night.  Passing gas. No BM.  Incisional soreness especially after n/v.  Objective: Vital signs in last 24 hours: Temp:  [97.5 F (36.4 C)-98.4 F (36.9 C)] 97.9 F (36.6 C) (07/18 0530) Pulse Rate:  [83-100] 83 (07/18 0530) Resp:  [17-18] 18 (07/18 0530) BP: (111-130)/(66-82) 124/73 (07/18 0530) SpO2:  [96 %-99 %] 97 % (07/18 0530) Last BM Date: 05/17/17  Intake/Output from previous day: 07/17 0701 - 07/18 0700 In: 510 [P.O.:510] Out: 1700 [Urine:1700] Intake/Output this shift: No intake/output data recorded.  PE: General- In NAD.  Awake and alert. Abdomen-soft, wounds clean and intact with some surrounding bruising,  active bowel sounds   Lab Results:   Recent Labs  05/21/17 0453 05/22/17 0435  WBC 12.9* 10.2  HGB 11.1* 11.3*  HCT 34.6* 35.2*  PLT 327 342   BMET  Recent Labs  05/21/17 0453 05/22/17 0435  NA 133* 136  K 3.6 3.3*  CL 100* 101  CO2 25 27  GLUCOSE 107* 107*  BUN 9 10  CREATININE 0.85 0.77  CALCIUM 8.3* 8.3*   PT/INR No results for input(s): LABPROT, INR in the last 72 hours. Comprehensive Metabolic Panel:    Component Value Date/Time   NA 136 05/22/2017 0435   NA 133 (L) 05/21/2017 0453   K 3.3 (L) 05/22/2017 0435   K 3.6 05/21/2017 0453   CL 101 05/22/2017 0435   CL 100 (L) 05/21/2017 0453   CO2 27 05/22/2017 0435   CO2 25 05/21/2017 0453   BUN 10 05/22/2017 0435   BUN 9 05/21/2017 0453   CREATININE 0.77 05/22/2017 0435   CREATININE 0.85 05/21/2017 0453   GLUCOSE 107 (H) 05/22/2017 0435   GLUCOSE 107 (H) 05/21/2017 0453   CALCIUM 8.3 (L) 05/22/2017 0435   CALCIUM 8.3 (L) 05/21/2017 0453   AST 22 05/14/2017 1100   AST 25 12/25/2016 1614   ALT 31 05/14/2017 1100   ALT 32 12/25/2016 1614   ALKPHOS 76 05/14/2017 1100   ALKPHOS 81 12/25/2016 1614   BILITOT 0.3 05/14/2017 1100   BILITOT <0.1 (L) 12/25/2016 1614   PROT 7.1 05/14/2017 1100   PROT 6.5 12/25/2016 1614   ALBUMIN 4.1 05/14/2017 1100   ALBUMIN 3.7 12/25/2016 1614     Studies/Results: No results found.  Anti-infectives: Anti-infectives    Start     Dose/Rate Route Frequency Ordered Stop   05/19/17 2200  sulfamethoxazole-trimethoprim (BACTRIM DS,SEPTRA DS) 800-160 MG per tablet 1 tablet     1 tablet Oral Every 12 hours 05/19/17 1949     05/17/17 0600  vancomycin (VANCOCIN) IVPB 1000 mg/200 mL premix     1,000 mg 200 mL/hr over 60 Minutes Intravenous 60 min pre-op 05/17/17 0515 05/17/17 0759   05/17/17 0530  clindamycin (CLEOCIN) IVPB 900 mg     900 mg 100 mL/hr over 30 Minutes Intravenous  Once 05/17/17 0515 05/17/17 0835  [name] 05/22/2017

## 2017-05-22 NOTE — Progress Notes (Signed)
5 Days Post-Op Subjective: Ongoing pain and nausea particularly with medications. Vomited x3 yesterday. Continuing to pass flatus, no BM. Incisional soreness, particularly after vomiting.    Objective: Vital signs in last 24 hours: Temp:  [97.5 F (36.4 C)-98.4 F (36.9 C)] 97.9 F (36.6 C) (07/18 0530) Pulse Rate:  [83-100] 83 (07/18 0530) Resp:  [17-18] 18 (07/18 0530) BP: (111-130)/(66-82) 124/73 (07/18 0530) SpO2:  [96 %-99 %] 97 % (07/18 0530)  Intake/Output from previous day: 07/17 0701 - 07/18 0700 In: 510 [P.O.:510] Out: 1700 [Urine:1700] Intake/Output this shift: No intake/output data recorded.  Physical Exam:  General:alert, cooperative and mild distress GI: Abdomen is mildly obese. Soft with incisions that are free of any sign of infection. She is tender to palpation on the right side more than left with no mass noted. Soft, mildly distended.   Lab Results:  Recent Labs  05/20/17 0446 05/21/17 0453 05/22/17 0435  HGB 11.6* 11.1* 11.3*  HCT 35.5* 34.6* 35.2*   BMET  Recent Labs  05/21/17 0453 05/22/17 0435  NA 133* 136  K 3.6 3.3*  CL 100* 101  CO2 25 27  GLUCOSE 107* 107*  BUN 9 10  CREATININE 0.85 0.77  CALCIUM 8.3* 8.3*   No results for input(s): LABPT, INR in the last 72 hours. No results for input(s): LABURIN in the last 72 hours. Results for orders placed or performed during the hospital encounter of 05/17/17  Urine Culture     Status: Abnormal   Collection Time: 05/19/17  6:17 PM  Result Value Ref Range Status   Specimen Description URINE, CLEAN CATCH  Final   Special Requests NONE  Final   Culture 30,000 COLONIES/mL CITROBACTER FREUNDII (A)  Final   Report Status 05/22/2017 FINAL  Final   Organism ID, Bacteria CITROBACTER FREUNDII (A)  Final      Susceptibility   Citrobacter freundii - MIC*    CEFAZOLIN >=64 RESISTANT Resistant     CEFTRIAXONE <=1 SENSITIVE Sensitive     CIPROFLOXACIN <=0.25 SENSITIVE Sensitive     GENTAMICIN <=1  SENSITIVE Sensitive     IMIPENEM 0.5 SENSITIVE Sensitive     NITROFURANTOIN <=16 SENSITIVE Sensitive     TRIMETH/SULFA <=20 SENSITIVE Sensitive     PIP/TAZO <=4 SENSITIVE Sensitive     * 30,000 COLONIES/mL CITROBACTER FREUNDII    Studies/Results: No results found.  Assessment/Plan: 46 yo POD5 s/p radical lap nephrectomy with partial gastrectomy. Cr, hgb stable. WBC normalized today. Ongoing pain and nausea, now with vomiting despite passing gas.  1. Diet, bowel regimen per general surgery  2. Continue ambulation 3. Scheduled tylenol, lidocaine patches for pain.  4. Total abx course x7 days, ending 7/22 PM.    LOS: 5 days   [name] L 05/22/2017, 9:21 AM

## 2017-05-22 NOTE — Progress Notes (Signed)
Initial Nutrition Assessment  DOCUMENTATION CODES:   Obesity unspecified  INTERVENTION:  - Will order Boost Breeze BID, each supplement provides 250 kcal and 9 grams of protein - Will order Unjury Chicken Soup BID, each 8 ounce serving provides 100kcal and 21g protein. - Diet re-advancement as medically feasible. - RD will continue to monitor for needs.   NUTRITION DIAGNOSIS:   Inadequate protein intake related to  (current diet order) as evidenced by  (CLD does not meet estimated protein needs).  GOAL:   Patient will meet greater than or equal to 90% of their needs  MONITOR:   PO intake, Supplement acceptance, Diet advancement, Weight trends, Labs, Skin  REASON FOR ASSESSMENT:   Consult  (change in diet order)  ASSESSMENT:   46 year-old female who presented for hydronephrosis with R atrophic nonfunctioning kidney and R distal ureteral obstruction secondary to extrinsic mass. She underwent laparoscopic resection of gastric wall tumor and upper endoscopy on 05/17/17.  Pt seen for consult d/t down grade in diet from Dysphagia 1, thin liquids with 1.8L fluid restriction to CLD. BMI indicates obesity. Pt is POD #5. Diet downgraded d/t persistent N/V. Per Dr. Bertrum Sol note yesterday AM, pathology findings are "consistent with ectopic pancreas (benign)."  Physical assessment shows no muscle or fat wasting present. Per chart review, weight with some fluctuation (185-193 lbs) since February. Most recently, she has lost 4 lbs (2% body weight) in 2 months which is not significant for time frame. Will trial supplements as outlined above to aid in meeting protein goal.   Medications reviewed; 10 mg IV Reglan QID, 40 mg oral Protonix/day, 1 packet Miralax/day, 250 mg Florastor BID. Labs reviewed; K: 3.3 mmol/L, Ca: 8.3 mg/dL.  IVF: D5-NS-20 mEq IV KCl @ 100 mL/hr (408 kcal from dextrose).    Diet Order:  Diet clear liquid Room service appropriate? Yes; Fluid consistency:  Thin  Skin:  Wound (see comment) (R abdominal and flank incision from 05/17/17)  Last BM:  7/13  Height:   Ht Readings from Last 1 Encounters:  05/17/17 5' (1.524 m)    Weight:   Wt Readings from Last 1 Encounters:  05/17/17 189 lb (85.7 kg)    Ideal Body Weight:  45.45 kg  BMI:  Body mass index is 36.91 kg/m.  Estimated Nutritional Needs:   Kcal:  1715-1970 (20-23 kcal/kg)  Protein:  85-100 grams (1-1.2 grams/kg)  Fluid:  >/= 2 L/day  EDUCATION NEEDS:   Education needs addressed    Jarome Matin, MS, RD, LDN, CNSC Inpatient Clinical Dietitian Pager # [phone] After hours/weekend pager # [phone]

## 2017-05-23 LAB — BASIC METABOLIC PANEL
Anion gap: 6 (ref 5–15)
BUN: 7 mg/dL (ref 6–20)
CALCIUM: 8.1 mg/dL — AB (ref 8.9–10.3)
CO2: 25 mmol/L (ref 22–32)
Chloride: 105 mmol/L (ref 101–111)
Creatinine, Ser: 0.71 mg/dL (ref 0.44–1.00)
GFR calc Af Amer: >60 mL/min (ref >60)
GLUCOSE: 118 mg/dL — AB (ref 65–99)
POTASSIUM: 4.1 mmol/L (ref 3.5–5.1)
SODIUM: 136 mmol/L (ref 135–145)

## 2017-05-23 LAB — CBC
HEMATOCRIT: 34.3 % — AB (ref 36.0–46.0)
Hemoglobin: 11.1 g/dL — ABNORMAL LOW (ref 12.0–15.0)
MCH: 27.1 pg (ref 26.0–34.0)
MCHC: 32.4 g/dL (ref 30.0–36.0)
MCV: 83.9 fL (ref 78.0–100.0)
Platelets: 345 10*3/uL (ref 150–400)
RBC: 4.09 MIL/uL (ref 3.87–5.11)
RDW: 13.5 % (ref 11.5–15.5)
WBC: 12.5 10*3/uL — AB (ref 4.0–10.5)

## 2017-05-23 MED ORDER — ACETAMINOPHEN 500 MG PO TABS
1000.0000 mg | ORAL_TABLET | Freq: Four times a day (QID) | ORAL | Status: DC
  Administered 2017-05-23 – 2017-05-24 (×4): 1000 mg via ORAL
  Filled 2017-05-23 (×4): qty 2

## 2017-05-23 NOTE — Progress Notes (Signed)
6 Days Post-Op Subjective: Several bowel movements yesterday, feeling much better. Asking for solid food. Pain better controlled.   Objective: Vital signs in last 24 hours: Temp:  [97.9 F (36.6 C)-98.1 F (36.7 C)] 98.1 F (36.7 C) (07/19 0516) Pulse Rate:  [85-87] 85 (07/19 0516) Resp:  [15-18] 18 (07/19 0516) BP: (115-124)/(72-82) 124/76 (07/19 0516) SpO2:  [97 %-98 %] 97 % (07/19 0516)  Intake/Output from previous day: 07/18 0701 - 07/19 0700 In: 2445 [I.V.:1945; IV Piggyback:500] Out: 1200 [Urine:1200] Intake/Output this shift: Total I/O In: 175 [I.V.:175] Out: -   Physical Exam:  General:alert, cooperative and mild distress GI: Abdomen is mildly obese. Soft with incisions that are free of any sign of infection. Minimal tenderness to palpation today. Soft, non distended.   Lab Results:  Recent Labs  05/21/17 0453 05/22/17 0435 05/23/17 0455  HGB 11.1* 11.3* 11.1*  HCT 34.6* 35.2* 34.3*   BMET  Recent Labs  05/22/17 0435 05/23/17 0455  NA 136 136  K 3.3* 4.1  CL 101 105  CO2 27 25  GLUCOSE 107* 118*  BUN 10 7  CREATININE 0.77 0.71  CALCIUM 8.3* 8.1*   No results for input(s): LABPT, INR in the last 72 hours. No results for input(s): LABURIN in the last 72 hours. Results for orders placed or performed during the hospital encounter of 05/17/17  Urine Culture     Status: Abnormal   Collection Time: 05/19/17  6:17 PM  Result Value Ref Range Status   Specimen Description URINE, CLEAN CATCH  Final   Special Requests NONE  Final   Culture 30,000 COLONIES/mL CITROBACTER FREUNDII (A)  Final   Report Status 05/22/2017 FINAL  Final   Organism ID, Bacteria CITROBACTER FREUNDII (A)  Final      Susceptibility   Citrobacter freundii - MIC*    CEFAZOLIN >=64 RESISTANT Resistant     CEFTRIAXONE <=1 SENSITIVE Sensitive     CIPROFLOXACIN <=0.25 SENSITIVE Sensitive     GENTAMICIN <=1 SENSITIVE Sensitive     IMIPENEM 0.5 SENSITIVE Sensitive     NITROFURANTOIN  <=16 SENSITIVE Sensitive     TRIMETH/SULFA <=20 SENSITIVE Sensitive     PIP/TAZO <=4 SENSITIVE Sensitive     * 30,000 COLONIES/mL CITROBACTER FREUNDII    Studies/Results: Dg Abd 2 Views  Result Date: 05/22/2017 CLINICAL DATA:  Nausea vomiting.  Abdominal surgery. EXAM: ABDOMEN - 2 VIEW COMPARISON:  CT 02/26/2017. FINDINGS: Surgical clips right upper quadrant. Surgical sutures left upper abdomen. No bowel distention. No free air. Calcifications in pelvis consistent phleboliths. Lung bases are clear. IMPRESSION: No acute abnormality. Electronically Signed   By: Marcello Moores  Register   On: 05/22/2017 09:49    Assessment/Plan: 46 yo POD5 s/p radical lap nephrectomy with partial gastrectomy. Appears much better, tolerating regular diet and having BMs. Cr, hgb stable. WBC elevated again today.  1. Diet, bowel regimen per general surgery  2. Continue ambulation 3. Scheduled tylenol, lidocaine patches for pain.  4. Total abx course x7 days for UTI, ending 7/22 PM.  5. Agree with general surgery plan re WBC monitoring and plan for imaging tomorrow if elevated.    LOS: 6 days   FILIPPOU, PAULINE L 05/23/2017, 9:21 AM

## 2017-05-23 NOTE — Progress Notes (Signed)
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  Continue Reglan. Check WBC tomorrow.  If WBC not going up and she is tolerating diet, will discharge tomorrow.  If WBC continues to increase, will get CT.  LOS: 6 days     6 Days Post-Op  Chief Complaint/Subjective: Had 2 BMs and is feeling better.  Nausea better. Wants to eat solid food.  Objective: Vital signs in last 24 hours: Temp:  [97.9 F (36.6 C)-98.1 F (36.7 C)] 98.1 F (36.7 C) (07/19 0516) Pulse Rate:  [85-87] 85 (07/19 0516) Resp:  [15-18] 18 (07/19 0516) BP: (115-124)/(72-82) 124/76 (07/19 0516) SpO2:  [97 %-98 %] 97 % (07/19 0516) Last BM Date: 05/17/17  Intake/Output from previous day: 07/18 0701 - 07/19 0700 In: 2445 [I.V.:1945; IV Piggyback:500] Out: 1200 [Urine:1200] Intake/Output this shift: No intake/output data recorded.  PE: General- In NAD.  Awake and alert. In a much better mood today. Abdomen-soft, wounds clean and intact with some surrounding bruising,    Lab Results:   Recent Labs  05/22/17 0435 05/23/17 0455  WBC 10.2 12.5*  HGB 11.3* 11.1*  HCT 35.2* 34.3*  PLT 342 345   BMET  Recent Labs  05/22/17 0435 05/23/17 0455  NA 136 136  K 3.3* 4.1  CL 101 105  CO2 27 25  GLUCOSE 107* 118*  BUN 10 7  CREATININE 0.77 0.71  CALCIUM 8.3* 8.1*   PT/INR No results for input(s): LABPROT, INR in the last 72 hours. Comprehensive Metabolic Panel:    Component Value Date/Time   NA 136 05/23/2017 0455   NA 136 05/22/2017 0435   K 4.1 05/23/2017 0455   K  3.3 (L) 05/22/2017 0435   CL 105 05/23/2017 0455   CL 101 05/22/2017 0435   CO2 25 05/23/2017 0455   CO2 27 05/22/2017 0435   BUN 7 05/23/2017 0455   BUN 10 05/22/2017 0435   CREATININE 0.71 05/23/2017 0455   CREATININE 0.77 05/22/2017 0435   GLUCOSE 118 (H) 05/23/2017 0455   GLUCOSE 107 (H) 05/22/2017 0435   CALCIUM 8.1 (L) 05/23/2017 0455   CALCIUM 8.3 (L) 05/22/2017 0435   AST 22 05/14/2017 1100   AST 25 12/25/2016 1614   ALT 31 05/14/2017 1100   ALT 32 12/25/2016 1614   ALKPHOS 76 05/14/2017 1100   ALKPHOS 81 12/25/2016 1614   BILITOT 0.3 05/14/2017 1100   BILITOT <0.1 (L) 12/25/2016 1614   PROT 7.1 05/14/2017 1100   PROT 6.5 12/25/2016 1614   ALBUMIN 4.1 05/14/2017 1100   ALBUMIN 3.7 12/25/2016 1614     Studies/Results: Dg Abd 2 Views  Result Date: 05/22/2017 CLINICAL DATA:  Nausea vomiting.  Abdominal surgery. EXAM: ABDOMEN - 2 VIEW COMPARISON:  CT 02/26/2017. FINDINGS: Surgical clips right upper quadrant. Surgical sutures left upper abdomen. No bowel distention. No free air. Calcifications in pelvis consistent phleboliths. Lung bases are clear. IMPRESSION: No acute abnormality. Electronically Signed   By: Marcello Moores  Register   On: 05/22/2017 09:49    Anti-infectives: Anti-infectives    Start     Dose/Rate Route Frequency Ordered Stop  05/19/17 2200  sulfamethoxazole-trimethoprim (BACTRIM DS,SEPTRA DS) 800-160 MG per tablet 1 tablet     1 tablet Oral Every 12 hours 05/19/17 1949     05/17/17 0600  vancomycin (VANCOCIN) IVPB 1000 mg/200 mL premix     1,000 mg 200 mL/hr over 60 Minutes Intravenous 60 min pre-op 05/17/17 0515 05/17/17 0759   05/17/17 0530  clindamycin (CLEOCIN) IVPB 900 mg     900 mg 100 mL/hr over 30 Minutes Intravenous  Once 05/17/17 0515 05/17/17 0835       Dasiah Hooley J 05/23/2017

## 2017-05-24 ENCOUNTER — Inpatient Hospital Stay (HOSPITAL_COMMUNITY): Payer: PRIVATE HEALTH INSURANCE

## 2017-05-24 LAB — CBC
HCT: 34 % — ABNORMAL LOW (ref 36.0–46.0)
HEMOGLOBIN: 11 g/dL — AB (ref 12.0–15.0)
MCH: 27 pg (ref 26.0–34.0)
MCHC: 32.4 g/dL (ref 30.0–36.0)
MCV: 83.3 fL (ref 78.0–100.0)
Platelets: 361 10*3/uL (ref 150–400)
RBC: 4.08 MIL/uL (ref 3.87–5.11)
RDW: 13.6 % (ref 11.5–15.5)
WBC: 13.7 10*3/uL — ABNORMAL HIGH (ref 4.0–10.5)

## 2017-05-24 MED ORDER — SULFAMETHOXAZOLE-TRIMETHOPRIM 800-160 MG PO TABS: 1.0000 | ORAL_TABLET | Freq: Two times a day (BID) | ORAL | 0 refills | Status: DC

## 2017-05-24 MED ORDER — IOPAMIDOL (ISOVUE-300) INJECTION 61%: 15.0000 mL | Freq: Two times a day (BID) | INTRAVENOUS | Status: DC | PRN

## 2017-05-24 MED ORDER — IOPAMIDOL (ISOVUE-300) INJECTION 61%
INTRAVENOUS | Status: AC
  Administered 2017-05-24: 30 mL
  Filled 2017-05-24: qty 30

## 2017-05-24 MED ORDER — IOPAMIDOL (ISOVUE-300) INJECTION 61%
INTRAVENOUS | Status: AC
  Administered 2017-05-24: 11:00:00
  Filled 2017-05-24: qty 100

## 2017-05-24 MED ORDER — PANTOPRAZOLE SODIUM 40 MG PO TBEC: 40.0000 mg | DELAYED_RELEASE_TABLET | Freq: Every day | ORAL | 0 refills | Status: DC

## 2017-05-24 MED ORDER — IOPAMIDOL (ISOVUE-300) INJECTION 61%
100.0000 mL | Freq: Once | INTRAVENOUS | Status: AC | PRN
  Administered 2017-05-24: 100 mL via INTRAVENOUS

## 2017-05-24 MED ORDER — HYDROCODONE-ACETAMINOPHEN 5-325 MG PO TABS: 1.0000 | ORAL_TABLET | ORAL | 0 refills | Status: DC | PRN

## 2017-05-24 NOTE — Care Management Note (Signed)
Case Management Note  Patient Details  Name: [name] MRN: [id number] Date of Birth: 1971/01/16  Subjective/Objective:46 y/o f admitted w/Gastric tumor-ss/p gastrectomy. From home. Surgery/urology following. No CM needs.                    Action/Plan:d/c home.   Expected Discharge Date:                  Expected Discharge Plan:  Home/Self Care  In-House Referral:     Discharge planning Services  CM Consult  Post Acute Care Choice:    Choice offered to:     DME Arranged:    DME Agency:     HH Arranged:    [facility] Agency:     Status of Service:  Completed, signed off  If discussed at [employer] of Stay Meetings, dates discussed:    Additional Comments:  Dessa Phi, RN 05/24/2017, 10:16 AM

## 2017-05-24 NOTE — Progress Notes (Signed)
CT reviewed and discussed with Dr. Louis Meckel.  Right nephrectomy bed had Surgicel in it which may explain the tiny air bubbles.  She feels well.  No obvious abscess on CT.  Will discharge and check CBC on 05/27/17.

## 2017-05-24 NOTE — Progress Notes (Signed)
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  Feels good.  Not having pain.  Wants to go home.  Objective: Vital signs in last 24 hours: Temp:  [98.2 F (36.8 C)-98.8 F (37.1 C)] 98.7 F (37.1 C) (07/20 0459) Pulse Rate:  [82-97] 82 (07/20 0459) Resp:  [17-18] 18 (07/20 0459) BP: (127-135)/(73-78) 135/78 (07/20 0459) SpO2:  [94 %-100 %] 94 % (07/20 0459) Last BM Date: 05/23/17  Intake/Output from previous day: 07/19 0701 - 07/20 0700 In: 775 [P.O.:600; I.V.:175] Out: -  Intake/Output this shift: No intake/output data recorded.  PE: General- In NAD.  Awake and alert. In a much better mood today. Abdomen-soft, wounds clean and intact with some surrounding bruising,  Extr-no calf swelling or tenderness   Lab Results:   Recent Labs  05/23/17 0455 05/24/17 0444  WBC 12.5* 13.7*  HGB 11.1* 11.0*  HCT 34.3* 34.0*  PLT 345 361   BMET  Recent Labs  05/22/17 0435 05/23/17 0455  NA 136 136  K 3.3* 4.1  CL 101 105  CO2 27 25  GLUCOSE 107* 118*  BUN 10 7  CREATININE 0.77 0.71  CALCIUM 8.3* 8.1*   PT/INR No results for input(s): LABPROT, INR in the last 72 hours. Comprehensive Metabolic Panel:    Component Value Date/Time   NA 136 05/23/2017 0455   NA 136 05/22/2017 0435   K 4.1 05/23/2017 0455   K 3.3 (L) 05/22/2017 0435   CL 105 05/23/2017 0455   CL 101 05/22/2017 0435   CO2 25 05/23/2017 0455   CO2 27  05/22/2017 0435   BUN 7 05/23/2017 0455   BUN 10 05/22/2017 0435   CREATININE 0.71 05/23/2017 0455   CREATININE 0.77 05/22/2017 0435   GLUCOSE 118 (H) 05/23/2017 0455   GLUCOSE 107 (H) 05/22/2017 0435   CALCIUM 8.1 (L) 05/23/2017 0455   CALCIUM 8.3 (L) 05/22/2017 0435   AST 22 05/14/2017 1100   AST 25 12/25/2016 1614   ALT 31 05/14/2017 1100   ALT 32 12/25/2016 1614   ALKPHOS 76 05/14/2017 1100   ALKPHOS 81 12/25/2016 1614   BILITOT 0.3 05/14/2017 1100   BILITOT <0.1 (L) 12/25/2016 1614   PROT 7.1 05/14/2017 1100   PROT 6.5 12/25/2016 1614   ALBUMIN 4.1 05/14/2017 1100   ALBUMIN 3.7 12/25/2016 1614     Studies/Results: Dg Abd 2 Views  Result Date: 05/22/2017 CLINICAL DATA:  Nausea vomiting.  Abdominal surgery. EXAM: ABDOMEN - 2 VIEW COMPARISON:  CT 02/26/2017. FINDINGS: Surgical clips right upper quadrant. Surgical sutures left upper abdomen. No bowel distention. No free air. Calcifications in pelvis consistent phleboliths. Lung bases are clear. IMPRESSION: No acute abnormality. Electronically Signed   By: Marcello Moores  Register   On: 05/22/2017 09:49    Anti-infectives: Anti-infectives    Start     Dose/Rate Route Frequency Ordered Stop   05/19/17 2200  sulfamethoxazole-trimethoprim (BACTRIM DS,SEPTRA DS) 800-160 MG per tablet 1 tablet     1 tablet Oral Every  12 hours 05/19/17 1949     05/17/17 0600  vancomycin (VANCOCIN) IVPB 1000 mg/200 mL premix     1,000 mg 200 mL/hr over 60 Minutes Intravenous 60 min pre-op 05/17/17 0515 05/17/17 0759   05/17/17 0530  clindamycin (CLEOCIN) IVPB 900 mg     900 mg 100 mL/hr over 30 Minutes Intravenous  Once 05/17/17 0515 05/17/17 0835       [name] 05/24/2017

## 2017-05-24 NOTE — Progress Notes (Signed)
7 Days Post-Op Subjective: Much better today, no nausea, pain improved, up and walking, moving her bowels.  Objective: Vital signs in last 24 hours: Temp:  [98.7 F (37.1 C)-98.8 F (37.1 C)] 98.7 F (37.1 C) (07/20 0459) Pulse Rate:  [82-86] 82 (07/20 0459) Resp:  [17-18] 18 (07/20 0459) BP: (133-135)/(76-78) 135/78 (07/20 0459) SpO2:  [94 %-98 %] 94 % (07/20 0459)  Intake/Output from previous day: 07/19 0701 - 07/20 0700 In: 775 [P.O.:600; I.V.:175] Out: -  Intake/Output this shift: Total I/O In: 120 [P.O.:120] Out: -   Physical Exam:  General:alert, cooperative and mild distress GI: Abdomen is mildly obese. Soft with incisions that are free of any sign of infection. Minimal tenderness to palpation today. Soft, non distended.   Lab Results:  Recent Labs  05/22/17 0435 05/23/17 0455 05/24/17 0444  HGB 11.3* 11.1* 11.0*  HCT 35.2* 34.3* 34.0*   BMET  Recent Labs  05/22/17 0435 05/23/17 0455  NA 136 136  K 3.3* 4.1  CL 101 105  CO2 27 25  GLUCOSE 107* 118*  BUN 10 7  CREATININE 0.77 0.71  CALCIUM 8.3* 8.1*   No results for input(s): LABPT, INR in the last 72 hours. No results for input(s): LABURIN in the last 72 hours. Results for orders placed or performed during the hospital encounter of 05/17/17  Urine Culture     Status: Abnormal   Collection Time: 05/19/17  6:17 PM  Result Value Ref Range Status   Specimen Description URINE, CLEAN CATCH  Final   Special Requests NONE  Final   Culture 30,000 COLONIES/mL CITROBACTER FREUNDII (A)  Final   Report Status 05/22/2017 FINAL  Final   Organism ID, Bacteria CITROBACTER FREUNDII (A)  Final      Susceptibility   Citrobacter freundii - MIC*    CEFAZOLIN >=64 RESISTANT Resistant     CEFTRIAXONE <=1 SENSITIVE Sensitive     CIPROFLOXACIN <=0.25 SENSITIVE Sensitive     GENTAMICIN <=1 SENSITIVE Sensitive     IMIPENEM 0.5 SENSITIVE Sensitive     NITROFURANTOIN <=16 SENSITIVE Sensitive     TRIMETH/SULFA <=20  SENSITIVE Sensitive     PIP/TAZO <=4 SENSITIVE Sensitive     * 30,000 COLONIES/mL CITROBACTER FREUNDII    Studies/Results: Ct Abdomen Pelvis W Contrast  Result Date: 05/24/2017 CLINICAL DATA:  Status post partial gastrectomy on 05/17/2016. Right nephrectomy. Elevated white blood cell count. Evaluate for abscess. EXAM: CT ABDOMEN AND PELVIS WITH CONTRAST TECHNIQUE: Multidetector CT imaging of the abdomen and pelvis was performed using the standard protocol following bolus administration of intravenous contrast. CONTRAST:  118mL ISOVUE-300 IOPAMIDOL (ISOVUE-300) INJECTION 61% COMPARISON:  02/26/2017 FINDINGS: Lower chest: No pleural effusions identified. Hepatobiliary: No focal liver abnormality is seen. Status post cholecystectomy. No biliary dilatation. Pancreas: Unremarkable. No pancreatic ductal dilatation or surrounding inflammatory changes. Spleen: Normal in size without focal abnormality. Adrenals/Urinary Tract: Normal appearance of the left adrenal gland. Small left kidney cysts measures 7 mm. No left-sided kidney mass or hydronephrosis. Status post right nephrectomy. There is fluid with bubbly lucencies within the nephrectomy bed measuring 3.6 x 2.9 x 5.3 cm, image 63 of series 4 and image number 32 of series 2. The urinary bladder appears normal. Stomach/Bowel: Postoperative change from partial gastrectomy. The small bowel loops have a normal course and caliber. There is no pathologic dilatation of the colon. Vascular/Lymphatic: Normal appearance of the abdominal aorta. No enlarged retroperitoneal or mesenteric adenopathy. No enlarged pelvic or inguinal lymph nodes. No upper scratched Reproductive: Previous hysterectomy.  Left ovary cyst measures 2.6 cm, image 65 of series 2. New from previous exam. Other: There is soft tissue stranding and fluid identified within the pelvis. No well defined enhancing fluid collections identified specific for abscess. Musculoskeletal: No acute or significant osseous  findings. IMPRESSION: 1. Status post partial gastrectomy and right nephrectomy. 2. There is a focal fluid within the nephrectomy bed with some bubbly lucencies. Findings may be consistent with expected postoperative change. If dissolvable packing material was left within the nephrectomy site this may explain the bubbly lucencies. Although not excluded, early abscess within the nephrectomy site is considered less favored. 3. Small to moderate fluid identified within the pelvis. At this time there is no well defined fluid collections identified to suggest a drainable abscess. Electronically Signed   By: Kerby Moors M.D.   On: 05/24/2017 12:01    Assessment/Plan: 46 yo POD6 s/p radical lap nephrectomy with partial gastrectomy. Doing well clinically, WBC slightly elevated.  CT scan per GS. Home if things look okay on CT scan.    LOS: 7 days   Ardis Hughs 05/24/2017, 2:08 PM

## 2017-06-10 NOTE — Discharge Summary (Signed)
Physician Discharge Summary  Patient ID: [name] MRN: [id number] DOB/AGE: [birth year] 46 y.o.  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 Postoperatively she used a fair amount of pain medication developed a postoperative ileus. She also had some fever and leukocytosis. Workup included cultures and CT scan. CT scan did not demonstrate evidence of intra-abdominal postop infection. Urine culture was consistent with urinary tract infection and she was started on Bactrim. She slowly began improving. White blood cell count stayed around 13,000 but she had no other clinical signs of infection. When her bowel function completely returned and she was tolerating a diet she is discharged to home on oral antibiotics and pain medication. Discharge instructions were given to her.  Discharge Exam: Blood pressure 133/75, pulse 75, temperature 98.4 F (36.9 C), temperature source Oral, resp. rate 17, height 5' (1.524 m), weight 85.7 kg (189 lb), SpO2 97 %.   Disposition: 01-Home or Self Care   Allergies as of 05/24/2017      Reactions   Amoxicillin Anaphylaxis   All "cillins" pt waas 48-78 years old  Has patient had a PCN reaction causing immediate rash, facial/tongue/throat swelling, SOB or lightheadedness with hypotension: {yes Has patient had a PCN reaction causing severe rash involving mucus membranes or skin necrosis: unknown Has patient had a PCN reaction that required hospitalization yes Has patient had a PCN reaction occurring within the last 10 years:no If all of the above answers are  "NO", then may proceed with Cephalosporin use.   Keflex [cephalexin] Anaphylaxis   Penicillins Anaphylaxis   Has patient had a PCN reaction causing immediate rash, facial/tongue/throat swelling, SOB or lightheadedness with hypotension:yes Has patient had a PCN reaction causing severe rash involving mucus membranes or skin necrosis: unknown Has patient had a PCN reaction that required hospitalization yes  Has patient had a PCN reaction occurring within the last 10 years: no If all of the above answers are "NO", then may proceed with Cephalosporin use.   Baby Powder [methylbenzethonium] Hives   Ibuprofen Hives   Doxycycline Diarrhea, Rash      Medication List    STOP taking these medications   APPLE CIDER VINEGAR PO   EXCEDRIN PO   famotidine 20 MG tablet Commonly known as:  PEPCID     TAKE these medications   HAIR SKIN & NAILS GUMMIES PO Take 3 each by mouth daily.   HYDROcodone-acetaminophen 5-325 MG tablet Commonly known as:  NORCO Take 1-2 tablets by mouth every 4 (four) hours as needed for moderate pain or severe pain.   OVER THE COUNTER MEDICATION Take 4 tablets by mouth daily. Juice Plus   oxyCODONE 5 MG immediate release tablet Commonly known as:  Oxy IR/ROXICODONE Take 1-2 tablets (5-10 mg total) by mouth every 4 (four) hours as needed for moderate pain, severe pain or breakthrough pain.   pantoprazole 40 MG tablet Commonly known as:  PROTONIX Take 1 tablet (40 mg total) by mouth daily. Take 30-60 min before first meal of the day What changed:  Another medication with the same name was added. Make sure you understand how and when to take each.  pantoprazole 40 MG tablet Commonly known as:  PROTONIX Take 1 tablet (40 mg total) by mouth daily. What changed:  You were already taking a medication with the same name, and this prescription was added. Make sure you understand how and when to take each.   PROVENTIL HFA 108 (90 Base) MCG/ACT inhaler Generic drug:   albuterol Inhale 2 puffs into the lungs every 6 (six) hours as needed for wheezing or shortness of breath.   sulfamethoxazole-trimethoprim 800-160 MG tablet Commonly known as:  BACTRIM DS,SEPTRA DS Take 1 tablet by mouth every 12 (twelve) hours.      Follow-up Information    Ardis Hughs, MD Follow up on 05/31/2017.   Specialty:  Urology Why:  9am - Jimmey Ralph, NP Contact information: [name] [address] [phone]           Signed: [name] 06/10/2017, 9:30 AM

## 2017-06-22 ENCOUNTER — Emergency Department (HOSPITAL_COMMUNITY)
Admission: EM | Admit: 2017-06-22 | Discharge: 2017-06-22 | Disposition: A | Discharge status: Home / Self Care | Payer: PRIVATE HEALTH INSURANCE | Attending: Emergency Medicine | Admitting: Emergency Medicine

## 2017-06-22 ENCOUNTER — Emergency Department (HOSPITAL_COMMUNITY)
Admission: EM | Admit: 2017-06-22 | Discharge: 2017-06-22 | Disposition: A | Discharge status: Other Acute Inpatient Hospital | Payer: PRIVATE HEALTH INSURANCE

## 2017-06-22 ENCOUNTER — Encounter (HOSPITAL_COMMUNITY): Payer: Self-pay | Admitting: *Deleted

## 2017-06-22 ENCOUNTER — Emergency Department (HOSPITAL_COMMUNITY): Payer: PRIVATE HEALTH INSURANCE

## 2017-06-22 DIAGNOSIS — J45909 Unspecified asthma, uncomplicated: Secondary | ICD-10-CM | POA: Diagnosis not present

## 2017-06-22 DIAGNOSIS — Z880 Allergy status to penicillin: Secondary | ICD-10-CM | POA: Insufficient documentation

## 2017-06-22 DIAGNOSIS — Z79899 Other long term (current) drug therapy: Secondary | ICD-10-CM | POA: Insufficient documentation

## 2017-06-22 DIAGNOSIS — R109 Unspecified abdominal pain: Secondary | ICD-10-CM | POA: Diagnosis not present

## 2017-06-22 HISTORY — DX: Unspecified asthma, uncomplicated: J45.909

## 2017-06-22 LAB — COMPREHENSIVE METABOLIC PANEL
ALBUMIN: 4.1 g/dL (ref 3.5–5.0)
ALK PHOS: 69 U/L (ref 38–126)
ALT: 33 U/L (ref 14–54)
ANION GAP: 8 (ref 5–15)
AST: 26 U/L (ref 15–41)
BILIRUBIN TOTAL: 0.6 mg/dL (ref 0.3–1.2)
BUN: 8 mg/dL (ref 6–20)
CALCIUM: 9.6 mg/dL (ref 8.9–10.3)
CO2: 25 mmol/L (ref 22–32)
CREATININE: 1.02 mg/dL — AB (ref 0.44–1.00)
Chloride: 106 mmol/L (ref 101–111)
GFR calc non Af Amer: >60 mL/min (ref >60)
GLUCOSE: 123 mg/dL — AB (ref 65–99)
Potassium: 3.6 mmol/L (ref 3.5–5.1)
Sodium: 139 mmol/L (ref 135–145)
TOTAL PROTEIN: 7 g/dL (ref 6.5–8.1)

## 2017-06-22 LAB — URINALYSIS, ROUTINE W REFLEX MICROSCOPIC
BILIRUBIN URINE: NEGATIVE
Glucose, UA: NEGATIVE mg/dL
HGB URINE DIPSTICK: NEGATIVE
KETONES UR: NEGATIVE mg/dL
Leukocytes, UA: NEGATIVE
NITRITE: NEGATIVE
PROTEIN: NEGATIVE mg/dL
SPECIFIC GRAVITY, URINE: 1.005 (ref 1.005–1.030)
pH: 6 (ref 5.0–8.0)

## 2017-06-22 LAB — CBC
HCT: 38.3 % (ref 36.0–46.0)
HEMOGLOBIN: 12.2 g/dL (ref 12.0–15.0)
MCH: 27 pg (ref 26.0–34.0)
MCHC: 31.9 g/dL (ref 30.0–36.0)
MCV: 84.7 fL (ref 78.0–100.0)
PLATELETS: 406 10*3/uL — AB (ref 150–400)
RBC: 4.52 MIL/uL (ref 3.87–5.11)
RDW: 14.4 % (ref 11.5–15.5)
WBC: 9 10*3/uL (ref 4.0–10.5)

## 2017-06-22 LAB — LIPASE, BLOOD: Lipase: 28 U/L (ref 11–51)

## 2017-06-22 MED ORDER — SODIUM CHLORIDE 0.9 % IV BOLUS (SEPSIS)
1000.0000 mL | Freq: Once | INTRAVENOUS | Status: AC
  Administered 2017-06-22: 1000 mL via INTRAVENOUS

## 2017-06-22 MED ORDER — IOPAMIDOL (ISOVUE-300) INJECTION 61%
INTRAVENOUS | Status: AC
  Administered 2017-06-22: 100 mL
  Filled 2017-06-22: qty 100

## 2017-06-22 MED ORDER — PROMETHAZINE HCL 25 MG/ML IJ SOLN
25.0000 mg | Freq: Once | INTRAMUSCULAR | Status: AC
  Administered 2017-06-22: 25 mg via INTRAVENOUS
  Filled 2017-06-22: qty 1

## 2017-06-22 NOTE — ED Triage Notes (Signed)
THEN PT IS C/O ABD PAIN FOR 2-3 DAYS.  SHE HAS EXTENSIVE SURGERY 4 WEEKS AGO IN HER ABD AREA.  4 DAYS AGO THE PT PICKED UP HER 20 LB CHILD  SINCE THEN THE PAIUN HAS INCRFEASED  FOR THE PAST 2 DAYS IT HAS BEEN WORSE  CRYING IN PAIN AT TRIAGE

## 2017-06-22 NOTE — ED Notes (Signed)
Patient able to ambulate independently  

## 2017-06-22 NOTE — ED Notes (Signed)
Pt transported to CT ?

## 2017-06-22 NOTE — ED Notes (Signed)
Pt refusing pain meds at this time

## 2017-06-22 NOTE — ED Provider Notes (Signed)
[facility] DEPT Provider Note   CSN: [id number] Arrival date & time: 06/22/17  1535     History   Chief Complaint Chief Complaint  Patient presents with  . Abdominal Pain    HPI [name] is a 46 y.o. female.   Abdominal Pain   This is a new problem. The current episode started 2 days ago. The problem occurs constantly. The problem has not changed since onset.The pain is associated with a previous surgery. The pain is located in the RLQ. Pertinent negatives include nausea and vomiting.    Past Medical History:  Diagnosis Date  . Arthritis   . Asthma   . Complication of anesthesia    respirations slow down after coming out from Anesthesia  . Endometriosis   . GERD (gastroesophageal reflux disease)     Patient Active Problem List   Diagnosis Date Noted  . Mass of ureter s/p resection 05/17/2017 05/18/2017  . Atrophic kidney s/p right nepherctomy 05/17/2017 05/18/2017  . GERD (gastroesophageal reflux disease)   . Complication of anesthesia   . Arthritis   . Gastric tumor s/p partial gastrectomy 05/17/2016 05/17/2017  . Solitary pulmonary nodule 02/18/2017  . Genetic testing 04/04/2016  . Family history of colon cancer 03/14/2016  . Family history of breast cancer in female 03/14/2016  . Morbid obesity due to excess calories ([place]) 01/18/2012  . Anxiety state 04/20/2008  . Cough variant asthma 04/20/2008  . LIVER FUNCTION TESTS, ABNORMAL 04/20/2008  . Attention deficit hyperactivity disorder (ADHD) 06/10/2007    Past Surgical History:  Procedure Laterality Date  . ABDOMINAL HYSTERECTOMY    . CERVICAL DISC SURGERY    . CHOLECYSTECTOMY    . EUS N/A 03/14/2017   Procedure: UPPER ENDOSCOPIC ULTRASOUND (EUS) LINEAR;  Surgeon: Milus Banister, MD;  Location: [facility] ENDOSCOPY;  Service: Endoscopy;  Laterality: N/A;  . LAPAROSCOPIC ABDOMINAL EXPLORATION N/A 05/17/2017   Procedure: LAPAROSCOPIC POSSIBLE OPEN REMOVAL OF STOMACH MASS;  Surgeon: Jackolyn Confer,  MD;  Location: [facility] ORS;  Service: General;  Laterality: N/A;  . LAPAROSCOPIC NEPHRECTOMY Right 05/17/2017   Procedure: RIGHT RADICAL NEPHRECTOMY, URETERECTOMY;  Surgeon: Ardis Hughs, MD;  Location: [facility] ORS;  Service: Urology;  Laterality: Right;  . tubal preganancy      OB History    Gravida Para Term Preterm AB Living   7 3 3   4 3    SAB TAB Ectopic Multiple Live Births   4               Home Medications    Prior to Admission medications   Medication Sig Start Date End Date Taking? Authorizing Provider  acetaminophen (TYLENOL) 500 MG tablet Take 1,000 mg by mouth every 6 (six) hours as needed (pain).   Yes [provider]  albuterol (PROVENTIL HFA) 108 (90 Base) MCG/ACT inhaler Inhale 2 puffs into the lungs every 6 (six) hours as needed for wheezing or shortness of breath.   Yes [provider]  APPLE CIDER VINEGAR PO Take 1 capsule by mouth daily.   Yes [provider]  Biotin w/ Vitamins C & E (HAIR SKIN & NAILS GUMMIES PO) Take 2 tablets by mouth daily.    Yes [provider]  Cholecalciferol (VITAMIN D PO) Take 2 tablets by mouth daily.   Yes [provider]  Cyanocobalamin (VITAMIN B-12 IJ) Inject 1 each into the muscle every Thursday.   Yes [provider]  Cyanocobalamin (VITAMIN B12 PO) Take 2 tablets by mouth  daily.   Yes [provider]  Multiple Vitamin (MULTIVITAMIN WITH MINERALS) TABS tablet Take 1 tablet by mouth daily. Centrum   Yes [provider]  Multiple Vitamins-Minerals (MULTIVITAMIN GUMMIES ADULT) CHEW Chew 2 tablets by mouth daily.   Yes [provider]  OVER THE COUNTER MEDICATION Take 4 tablets by mouth daily. Juice Plus    Yes [provider]  pantoprazole (PROTONIX) 40 MG tablet Take 1 tablet (40 mg total) by mouth daily. 05/24/17  Yes Rosenbower, Sherren Mocha, MD  phentermine (ADIPEX-P) 37.5 MG tablet Take 37.5 mg by mouth daily before breakfast.   Yes [provider]  PRESCRIPTION MEDICATION See admin instructions. 2 injections from Dr. Sharlet Salina every Tuesday and Thursday   Yes [provider]    Family History Family History  Problem Relation Age of Onset  . Hypertension Mother   . Cancer Mother 15       "back cancer"  . Other Mother        hysterectomy at 8 for "pre-cancerous cervical cells"  . Hypertension Father   . Colon cancer Father 64  . Cervical cancer Sister        full sister dx. unspecified age  . Colon polyps Sister        unspecified number  . Breast cancer Maternal Grandmother        unspecified age  . Diabetes Maternal Grandmother   . Diabetes Paternal Grandfather   . Colon cancer Paternal Aunt        (x2) paternal aunts with colon cancer at unspecified ages; lim info  . Colon cancer Paternal Uncle        dx. unspecified age; lim info  . Colon polyps Sister        paternal half-sister w/ colon polyps dx. age 80 or younger - unspecified number  . Colon cancer Sister 7       paternal half-sister   . Colon polyps Sister        paternal half-sister; unspecified number  . Colon cancer Sister        paternal half-sister dx. before age 65; has had two colon cancers  . Breast cancer Sister        paternal half-sister dx under age 67  . Renal cancer Sister 17       paternal half-sister dx. w/ "[facility] - lymphoid renal cancer"  . Cancer Sister 52       paternal half-sister dx cancer of wrist that was not a skin cancer  . Colon polyps Son 29       has had a "bleeding polyp"  . Cervical cancer Other 25       niece; +hysterectomy  . Breast cancer Paternal Aunt        (x3) paternal aunts with breast cancer at unspecified ages; lim info    Social History Social History  Substance Use Topics  . Smoking status: Never Smoker  . Smokeless tobacco: Never Used  . Alcohol use No     Allergies   Amoxicillin; Keflex [cephalexin]; Penicillins; Baby powder [methylbenzethonium]; Ibuprofen; and Doxycycline   Review of  Systems Review of Systems  Gastrointestinal: Positive for abdominal pain. Negative for nausea and vomiting.  All other systems reviewed and are negative.    Physical Exam Updated Vital Signs BP 122/81   Pulse 99   Temp 97.8 F (36.6 C) (Oral)   Resp 16   Ht 5\' 2"  (1.575 m)   Wt 85.7 kg (189 lb)  SpO2 99%   BMI 34.57 kg/m   Physical Exam  Constitutional: She is oriented to person, place, and time. She appears well-developed and well-nourished. She appears distressed (2/2 pain).  HENT:  Head: Normocephalic and atraumatic.  Eyes: Conjunctivae and EOM are normal.  Neck: Normal range of motion.  Cardiovascular: Normal rate and regular rhythm.   No murmur heard. Pulmonary/Chest: Effort normal and breath sounds normal. No stridor. No respiratory distress.  Abdominal: Soft. She exhibits no distension.  Musculoskeletal: Normal range of motion. She exhibits no edema or deformity.  Neurological: She is alert and oriented to person, place, and time. No cranial nerve deficit.  Skin: Skin is warm and dry.  Nursing note and vitals reviewed.    ED Treatments / Results  Labs (all labs ordered are listed, but only abnormal results are displayed) Labs Reviewed  COMPREHENSIVE METABOLIC PANEL - Abnormal; Notable for the following:       Result Value   Glucose, Bld 123 (*)    Creatinine, Ser 1.02 (*)    All other components within normal limits  CBC - Abnormal; Notable for the following:    Platelets 406 (*)    All other components within normal limits  URINALYSIS, ROUTINE W REFLEX MICROSCOPIC - Abnormal; Notable for the following:    Color, Urine COLORLESS (*)    All other components within normal limits  LIPASE, BLOOD    EKG  EKG Interpretation None       Radiology Ct Abdomen Pelvis W Contrast  Result Date: 06/22/2017 CLINICAL DATA:  Status post resection of lesser curvature gastric mass demonstrating benign gastric mucosa with ectopic pancreas, and right  nephroureterectomy with right ureteral endometriosis on 05/17/2017. Patient presents with abdominal pain. EXAM: CT ABDOMEN AND PELVIS WITH CONTRAST TECHNIQUE: Multidetector CT imaging of the abdomen and pelvis was performed using the standard protocol following bolus administration of intravenous contrast. CONTRAST:  136mL ISOVUE-300 IOPAMIDOL (ISOVUE-300) INJECTION 61% COMPARISON:  05/24/2017 CT abdomen/pelvis. FINDINGS: Lower chest: No significant pulmonary nodules or acute consolidative airspace disease. Hepatobiliary: Normal liver with no liver mass. Cholecystectomy. No biliary ductal dilatation. Common bile duct diameter 4 mm . Pancreas: Normal, with no mass or duct dilation. Spleen: Normal size. No mass. Adrenals/Urinary Tract: Normal adrenals. Status post right nephrectomy. Small 2.4 x 1.4 cm right nephrectomy bed fluid collection (series 3/ image 28) is decreased from 3.6 x 2.9 cm on 05/24/2017, and demonstrates no wall thickening or enhancement, with resolution of the previously visualized internal gas. No new fluid collections in the right nephrectomy bed. Normal left kidney with no left hydronephrosis and no left renal masses. Normal caliber left ureter. Normal bladder. Stomach/Bowel: Stable postsurgical changes along the lesser curvature of the stomach from partial gastrectomy, with no significant gastric distention and no acute gastric abnormality. Normal caliber small bowel with no small bowel wall thickening. Normal appendix. Normal large bowel with no diverticulosis, large bowel wall thickening or pericolonic fat stranding. Vascular/Lymphatic: Atherosclerotic nonaneurysmal abdominal aorta. Patent portal, splenic, hepatic and left renal veins. No pathologically enlarged lymph nodes in the abdomen or pelvis. Reproductive: Status post hysterectomy, with no mass or fluid collection at the vaginal cuff. No adnexal mass. Other: No pneumoperitoneum. Interval resolution of previously visualized ill-defined  fluid in the deep pelvis. No new focal fluid collections. Fat stranding in the ventral right abdominal wall is slightly decreased. Previously visualized subcutaneous emphysema has resolved. No superficial fluid collections. Musculoskeletal: No aggressive appearing focal osseous lesions. Minimal thoracolumbar spondylosis. IMPRESSION: 1. Small fluid collection  in the right nephrectomy bed is decreased in size since 05/24/2017 with resolved internal gas, most compatible with a resolving postoperative fluid collection. No CT features to suggest an abscess. 2. Resolved fluid in the deep pelvis. Resolving postsurgical changes in the right ventral abdominal wall. No new fluid collections. 3. No evidence of bowel obstruction or acute bowel inflammation. No free air. 4.  Aortic Atherosclerosis (ICD10-I70.0). Electronically Signed   By: Ilona Sorrel M.D.   On: 06/22/2017 18:46    Procedures Procedures (including critical care time)  Medications Ordered in ED Medications  sodium chloride 0.9 % bolus 1,000 mL (1,000 mLs Intravenous New Bag/Given 06/22/17 1719)  promethazine (PHENERGAN) injection 25 mg (25 mg Intravenous Given 06/22/17 1719)  iopamidol (ISOVUE-300) 61 % injection (100 mLs  Contrast Given 06/22/17 1803)     Initial Impression / Assessment and Plan / ED Course  I have reviewed the triage vital signs and the nursing notes.  Pertinent labs & imaging results that were available during my care of the patient were reviewed by me and considered in my medical decision making (see chart for details).    2-3 days of abdominal pain after picking up her granddaughter. Concerned she may have hurt something in her abdomen from a recent surgery. At this time does not want pain medicine 2/2 concern for nausea and not wanting to vomit.   Still in pain but improved. Still not wanting narcotics. Just wants to go home. Will follow up with surgeon this week, otherwise return here for new/worsening symptoms.    Final Clinical Impressions(s) / ED Diagnoses   Final diagnoses:  Abdominal pain, unspecified abdominal location      Merrily Pew, MD 06/22/17 2027

## 2017-06-22 NOTE — ED Notes (Signed)
Patient ambulated to restroom and able to toilet independently.

## 2017-07-22 ENCOUNTER — Telehealth: Payer: Self-pay | Admitting: Internal Medicine

## 2017-07-22 ENCOUNTER — Other Ambulatory Visit: Payer: Self-pay | Admitting: Internal Medicine

## 2017-07-22 MED ORDER — PANTOPRAZOLE SODIUM 40 MG PO TBEC: 40.0000 mg | DELAYED_RELEASE_TABLET | Freq: Every day | ORAL | 3 refills | Status: DC

## 2017-07-22 NOTE — Telephone Encounter (Signed)
Medication was refill.

## 2017-07-22 NOTE — Telephone Encounter (Signed)
Pt need new Rx for pantoprazole. [facility] in [place], [place]  Pt state that she would like to get this today due to her going out of town due to the weather and that the original prescriber would like for Dr. Marland KitchenK" to take over doing so.

## 2017-10-09 ENCOUNTER — Encounter: Payer: Self-pay | Admitting: Internal Medicine

## 2017-10-09 ENCOUNTER — Ambulatory Visit: Payer: PRIVATE HEALTH INSURANCE | Admitting: Internal Medicine

## 2017-10-09 VITALS — BP 142/88 | HR 105 | Temp 98.2°F | Wt 162.0 lb

## 2017-10-09 DIAGNOSIS — J019 Acute sinusitis, unspecified: Secondary | ICD-10-CM | POA: Diagnosis not present

## 2017-10-09 DIAGNOSIS — J45901 Unspecified asthma with (acute) exacerbation: Secondary | ICD-10-CM

## 2017-10-09 DIAGNOSIS — J069 Acute upper respiratory infection, unspecified: Secondary | ICD-10-CM

## 2017-10-09 MED ORDER — METHYLPREDNISOLONE ACETATE 80 MG/ML IJ SUSP
120.0000 mg | Freq: Once | INTRAMUSCULAR | Status: AC
  Administered 2017-10-09: 120 mg via INTRAMUSCULAR

## 2017-10-09 MED ORDER — HYDROCODONE-HOMATROPINE 5-1.5 MG/5ML PO SYRP: 5.0000 mL | ORAL_SOLUTION | Freq: Four times a day (QID) | ORAL | 0 refills | Status: DC | PRN

## 2017-10-09 MED ORDER — AZITHROMYCIN 250 MG PO TABS: ORAL_TABLET | ORAL | 0 refills | Status: DC

## 2017-10-09 NOTE — Progress Notes (Signed)
Chief Complaint  Patient presents with  . Cough    x 3 days, worse at night and sore throat. Dry hacky. Chest congestion. Headache, sinus burning and Sinus pressure. Fever 1 day ago. Pt reports that her body aches.     HPI: [name] 46 y.o.   PCP NA patient he is here with a young child and apparently family has had a coughing E illness with or without fever. She states that she knows she has an infection that needs an antibiotic and usually a steroid because of her wheezing. She had a renal tumor benign mass removed with a nephrectomy in the summer. She has to be cautious with medicines.  She cannot take amoxicillin or Doxy see list. She states that every time she gets like this she needs an antibiotic and prednisone version. Complaining of achy sore throat sinus pressure and drainage some wheezing uses her albuterol as needed is not on any preventive therapy. See above    Fever and cough s.    ROS: See pertinent positives and negatives per HPI.  Past Medical History:  Diagnosis Date  . Arthritis   . Asthma   . Complication of anesthesia    respirations slow down after coming out from Anesthesia  . Endometriosis   . GERD (gastroesophageal reflux disease)     Family History  Problem Relation Age of Onset  . Hypertension Mother   . Cancer Mother 7       "back cancer"  . Other Mother        hysterectomy at 33 for "pre-cancerous cervical cells"  . Hypertension Father   . Colon cancer Father 48  . Cervical cancer Sister        full sister dx. unspecified age  . Colon polyps Sister        unspecified number  . Breast cancer Maternal Grandmother        unspecified age  . Diabetes Maternal Grandmother   . Diabetes Paternal Grandfather   . Colon cancer Paternal Aunt        (x2) paternal aunts with colon cancer at unspecified ages; lim info  . Colon cancer Paternal Uncle        dx. unspecified age; lim info  . Colon polyps Sister        paternal half-sister  w/ colon polyps dx. age 51 or younger - unspecified number  . Colon cancer Sister 7       paternal half-sister   . Colon polyps Sister        paternal half-sister; unspecified number  . Colon cancer Sister        paternal half-sister dx. before age 71; has had two colon cancers  . Breast cancer Sister        paternal half-sister dx under age 62  . Renal cancer Sister 22       paternal half-sister dx. w/ "[facility] - lymphoid renal cancer"  . Cancer Sister 57       paternal half-sister dx cancer of wrist that was not a skin cancer  . Colon polyps Son 57       has had a "bleeding polyp"  . Cervical cancer Other 25       niece; +hysterectomy  . Breast cancer Paternal Aunt        (x3) paternal aunts with breast cancer at unspecified ages; lim info    Social History   Socioeconomic History  . Marital status: Married  Spouse name: None  . Number of children: None  . Years of education: None  . Highest education level: None  Social Needs  . Financial resource strain: None  . Food insecurity - worry: None  . Food insecurity - inability: None  . Transportation needs - medical: None  . Transportation needs - non-medical: None  Occupational History  . None  Tobacco Use  . Smoking status: Never Smoker  . Smokeless tobacco: Never Used  Substance and Sexual Activity  . Alcohol use: No  . Drug use: No  . Sexual activity: Yes    Birth control/protection: Surgical  Other Topics Concern  . None  Social History Narrative  . None    Outpatient Medications Prior to Visit  Medication Sig Dispense Refill  . acetaminophen (TYLENOL) 500 MG tablet Take 1,000 mg by mouth every 6 (six) hours as needed (pain).    Marland Kitchen albuterol (PROVENTIL HFA) 108 (90 Base) MCG/ACT inhaler Inhale 2 puffs into the lungs every 6 (six) hours as needed for wheezing or shortness of breath.    . APPLE CIDER VINEGAR PO Take 1 capsule by mouth daily.    . Biotin w/ Vitamins C & E (HAIR SKIN & NAILS GUMMIES PO) Take 2  tablets by mouth daily.     . Cholecalciferol (VITAMIN D PO) Take 2 tablets by mouth daily.    . Cyanocobalamin (VITAMIN B-12 IJ) Inject 1 each into the muscle every Thursday.    . Cyanocobalamin (VITAMIN B12 PO) Take 2 tablets by mouth daily.    . Multiple Vitamin (MULTIVITAMIN WITH MINERALS) TABS tablet Take 1 tablet by mouth daily. Centrum    . Multiple Vitamins-Minerals (MULTIVITAMIN GUMMIES ADULT) CHEW Chew 2 tablets by mouth daily.    Marland Kitchen OVER THE COUNTER MEDICATION Take 4 tablets by mouth daily. Juice Plus     . pantoprazole (PROTONIX) 40 MG tablet Take 1 tablet (40 mg total) by mouth daily. 90 tablet 3  . phentermine (ADIPEX-P) 37.5 MG tablet Take 37.5 mg by mouth daily before breakfast.    . PRESCRIPTION MEDICATION See admin instructions. 2 injections from Dr. Sharlet Salina every Tuesday and Thursday     Facility-Administered Medications Prior to Visit  Medication Dose Route Frequency Provider Last Rate Last Dose  . ipratropium-albuterol (DUONEB) 0.5-2.5 (3) MG/3ML nebulizer solution 3 mL  3 mL Nebulization Q6H Nafziger, Cory, NP         EXAM:  BP (!) 142/88 (BP Location: Right Arm, Patient Position: Sitting, Cuff Size: Normal)   Pulse (!) 105   Temp 98.2 F (36.8 C) (Oral)   Wt 162 lb (73.5 kg)   SpO2 98%   BMI 29.63 kg/m   Body mass index is 29.63 kg/m. WDWN in NAD  quiet respirations; mildly congested  somewhat hoarse. Non toxic . holding child  HEENT: Normocephalic ;atraumatic , Eyes;  PERRL, EOMs  Full, lids and conjunctiva clear,,Ears: no deformities, canals nl, TM landmarks normal, Nose: no deformity or discharge but congested;face minimally tender Mouth : OP clear without lesion or edema .  Neck: Supple without adenopathy or masses or bruits Chest:    No retractions tight no rales   Wheeze noted  CV:  S1-S2 no gallops or murmurs peripheral perfusion is normal Skin :nl perfusion and no acute rashes    ASSESSMENT AND PLAN:  Discussed the following assessment and  plan:  Moderate asthma with exacerbation, unspecified whether persistent - Plan: methylPREDNISolone acetate (DEPO-MEDROL) injection 120 mg  Upper respiratory tract infection, unspecified type -  Plan: methylPREDNISolone acetate (DEPO-MEDROL) injection 120 mg  Acute sinusitis, recurrence not specified, unspecified location Appears to have an asthma flare and she probably has a viral respiratory infection but she insists that antibiotics are needed to get better and that she is treated with this every time she gets this flareup.  She is allergic to Doxy and amoxicillin so use last line azithromycin as she tolerates.  Also steroid for her wheezing asthma prefers the injection today. Request hydrocodone cough medicine given to today for comfort. Consider follow-up with PCP or other about prevention when inhalers such as Symbicort Advair etc. to use when she gets a cold.  Perhaps will prevent her asthma flares.  -Patient advised to return or notify health care team  if symptoms worsen ,persist or new concerns arise.  Patient Instructions   Antibiotics may or may not help cause this could be a viral  respiratory infection. steroid for the asthma  Fluids   Cough med for comfort with care .   Advise   Disc about asthma control  Inhalers to prevent  Attacks  Like this   May benefit from  These .  Hope you feel better soon.       Standley Brooking. Easter Schinke M.D.

## 2017-10-09 NOTE — Patient Instructions (Addendum)
  Antibiotics may or may not help cause this could be a viral  respiratory infection. steroid for the asthma  Fluids   Cough med for comfort with care .   Advise   Disc about asthma control  Inhalers to prevent  Attacks  Like this   May benefit from  These .  Hope you feel better soon.

## 2017-11-11 ENCOUNTER — Encounter: Payer: Self-pay | Admitting: Physician Assistant

## 2017-11-19 ENCOUNTER — Encounter: Payer: Self-pay | Admitting: Physician Assistant

## 2017-11-19 ENCOUNTER — Ambulatory Visit: Payer: PRIVATE HEALTH INSURANCE | Admitting: Physician Assistant

## 2017-11-19 ENCOUNTER — Telehealth: Payer: Self-pay | Admitting: *Deleted

## 2017-11-19 ENCOUNTER — Other Ambulatory Visit (INDEPENDENT_AMBULATORY_CARE_PROVIDER_SITE_OTHER): Payer: PRIVATE HEALTH INSURANCE

## 2017-11-19 ENCOUNTER — Encounter (INDEPENDENT_AMBULATORY_CARE_PROVIDER_SITE_OTHER): Payer: Self-pay

## 2017-11-19 VITALS — BP 114/80 | HR 76 | Ht 60.0 in | Wt 176.0 lb

## 2017-11-19 DIAGNOSIS — R1013 Epigastric pain: Secondary | ICD-10-CM

## 2017-11-19 DIAGNOSIS — R1011 Right upper quadrant pain: Secondary | ICD-10-CM

## 2017-11-19 DIAGNOSIS — Z9049 Acquired absence of other specified parts of digestive tract: Secondary | ICD-10-CM

## 2017-11-19 DIAGNOSIS — K319 Disease of stomach and duodenum, unspecified: Secondary | ICD-10-CM

## 2017-11-19 DIAGNOSIS — Z800 Family history of malignant neoplasm of digestive organs: Secondary | ICD-10-CM

## 2017-11-19 DIAGNOSIS — Z8742 Personal history of other diseases of the female genital tract: Secondary | ICD-10-CM

## 2017-11-19 DIAGNOSIS — K3189 Other diseases of stomach and duodenum: Secondary | ICD-10-CM

## 2017-11-19 LAB — COMPREHENSIVE METABOLIC PANEL
ALBUMIN: 4.2 g/dL (ref 3.5–5.2)
ALK PHOS: 71 U/L (ref 39–117)
ALT: 24 U/L (ref 0–35)
AST: 15 U/L (ref 0–37)
BUN: 13 mg/dL (ref 6–23)
CO2: 31 mEq/L (ref 19–32)
CREATININE: 0.88 mg/dL (ref 0.40–1.20)
Calcium: 8.7 mg/dL (ref 8.4–10.5)
Chloride: 101 mEq/L (ref 96–112)
GFR: 72.62 mL/min (ref >60.00)
Glucose, Bld: 93 mg/dL (ref 70–99)
Potassium: 3.9 mEq/L (ref 3.5–5.1)
SODIUM: 138 meq/L (ref 135–145)
TOTAL PROTEIN: 6.8 g/dL (ref 6.0–8.3)
Total Bilirubin: 0.4 mg/dL (ref 0.2–1.2)

## 2017-11-19 LAB — CBC WITH DIFFERENTIAL/PLATELET
BASOS PCT: 0.4 % (ref 0.0–3.0)
Basophils Absolute: 0 10*3/uL (ref 0.0–0.1)
Eosinophils Absolute: 0.2 10*3/uL (ref 0.0–0.7)
Eosinophils Relative: 1.9 % (ref 0.0–5.0)
HCT: 42.9 % (ref 36.0–46.0)
HEMOGLOBIN: 13.7 g/dL (ref 12.0–15.0)
Lymphocytes Relative: 17.2 % (ref 12.0–46.0)
Lymphs Abs: 1.9 10*3/uL (ref 0.7–4.0)
MCHC: 31.9 g/dL (ref 30.0–36.0)
MCV: 84.4 fl (ref 78.0–100.0)
MONO ABS: 0.7 10*3/uL (ref 0.1–1.0)
Monocytes Relative: 6.7 % (ref 3.0–12.0)
Neutro Abs: 7.9 10*3/uL — ABNORMAL HIGH (ref 1.4–7.7)
Neutrophils Relative %: 73.8 % (ref 43.0–77.0)
Platelets: 381 10*3/uL (ref 150.0–400.0)
RBC: 5.09 Mil/uL (ref 3.87–5.11)
RDW: 15.6 % — AB (ref 11.5–15.5)
WBC: 10.7 10*3/uL — AB (ref 4.0–10.5)

## 2017-11-19 LAB — SEDIMENTATION RATE: Sed Rate: 14 mm/hr (ref 0–20)

## 2017-11-19 MED ORDER — NA SULFATE-K SULFATE-MG SULF 17.5-3.13-1.6 GM/177ML PO SOLN: 1.0000 | Freq: Once | ORAL | 0 refills | Status: AC

## 2017-11-19 MED ORDER — ONDANSETRON 4 MG PO TBDP: ORAL_TABLET | ORAL | 0 refills | Status: DC

## 2017-11-19 NOTE — Progress Notes (Signed)
 Subjective:    Patient ID: [name], female    DOB: 07/13/1971, 46 y.o.   MRN: [id number]  HPI [name] is a pleasant 46-year-old white female, known to Dr. Stark from prior colonoscopy and to Dr. Jacobs from a EUS done in May 2018 who comes in today for follow-up and to discuss endoscopy and colonoscopy.  Patient states that she had spoken to Dr. Rosenbower and was advised to be seen by GI. Patient has history of obesity, asthma, ADD, anxiety, GERD, and family history of colon cancer in her father diagnosed in his early 50s and multiple family members with colon polyps.  She had colonoscopy done last in 2009 for Dr. Stark which was normal exam. Patient underwent workup for abdominal pain this past summer.  She was seen by Dr. Ottelin, and found to have right hydronephrosis, and possible endometrioma.  She was found to have a severely decreased function of the right kidney.  Also imaging showed a gastric mass.  She was referred to Dr. Jacobs and had EUS on 03/14/2017 which showed a hyperechoic soft tissue lesion in the posterior gastric wall 2.2 cm.  This was aspirated and path was nondiagnostic.. Patient had surgery in July 2018 per Dr. Rosenbower and Dr. Herrick.  She had a right nephrectomy, distal ureterectomy, and removal of pelvic mass and right nephrectomy.  Dr. Rosenbower resected the gastric lesion laparoscopically with the aid of intraoperative endoscopy.  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 Patient says she did well postoperatively and has not had any problems with abdominal pain until a few weeks ago when she started having some aching type of discomfort in her right side and also in the epigastrium.  She says this comes and goes throughout the day but has persisted over the past couple of weeks.  She has not had any fever or  chills, no diarrhea or changes in bowel habits no melena or hematochezia.  Appetite has been okay and she has not noticed any changes in abdominal pain after eating. She is on phentermine for weight loss. Patient is very much wanting a colonoscopy, and wonders if she needs another endoscopic ultrasound.  She has not been reevaluated by urology or surgery as yet but has upcoming appointment with Dr. Ottelin.  Review of Systems Pertinent positive and negative review of systems were noted in the above HPI section.  All other review of systems was otherwise negative.  Outpatient Encounter Medications as of 11/19/2017  Medication Sig  . acetaminophen (TYLENOL) 500 MG tablet Take 1,000 mg by mouth every 6 (six) hours as needed (pain).  . albuterol (PROVENTIL HFA) 108 (90 Base) MCG/ACT inhaler Inhale 2 puffs into the lungs every 6 (six) hours as needed for wheezing or shortness of breath.  . APPLE CIDER VINEGAR PO Take 1 capsule by mouth daily.  . azithromycin (ZITHROMAX Z-PAK) 250 MG tablet Take 2 po first day, then 1 po qd  . Biotin w/ Vitamins C & E (HAIR SKIN & NAILS GUMMIES PO) Take 2 tablets by mouth daily.   . Cholecalciferol (VITAMIN D PO) Take 2 tablets by mouth daily.  . Cyanocobalamin (VITAMIN B-12 IJ) Inject 1 each into the muscle every Thursday.  . Cyanocobalamin (VITAMIN B12 PO) Take 2 tablets by mouth daily.  . Multiple Vitamins-Minerals (MULTIVITAMIN GUMMIES ADULT) CHEW Chew 2 tablets by mouth daily.  .   OVER THE COUNTER MEDICATION Take 4 tablets by mouth daily. Juice Plus   . pantoprazole (PROTONIX) 40 MG tablet Take 1 tablet (40 mg total) by mouth daily.  . phentermine (ADIPEX-P) 37.5 MG tablet Take 37.5 mg by mouth daily before breakfast.  . PRESCRIPTION MEDICATION See admin instructions. 2 injections from Dr. Crawford every Tuesday and Thursday  . [DISCONTINUED] HYDROcodone-homatropine (HYCODAN) 5-1.5 MG/5ML syrup Take 5 mLs by mouth every 6 (six) hours as needed for cough.  .  [DISCONTINUED] Multiple Vitamin (MULTIVITAMIN WITH MINERALS) TABS tablet Take 1 tablet by mouth daily. Centrum  . Na Sulfate-K Sulfate-Mg Sulf 17.5-3.13-1.6 GM/177ML SOLN Take 1 kit by mouth once for 1 dose.  . ondansetron (ZOFRAN ODT) 4 MG disintegrating tablet Dissolve 1 tablet on the tongue 30 min before drinking prep.   Facility-Administered Encounter Medications as of 11/19/2017  Medication  . ipratropium-albuterol (DUONEB) 0.5-2.5 (3) MG/3ML nebulizer solution 3 mL   Allergies  Allergen Reactions  . Amoxicillin Anaphylaxis    All "cillins" pt was 2-3 years old  Has patient had a PCN reaction causing immediate rash, facial/tongue/throat swelling, SOB or lightheadedness with hypotension: yes Has patient had a PCN reaction causing severe rash involving mucus membranes or skin necrosis: unknown Has patient had a PCN reaction that required hospitalization yes Has patient had a PCN reaction occurring within the last 10 years:no If all of the above answers are "NO", then may proceed with Cephalosporin use.   . Keflex [Cephalexin] Anaphylaxis  . Penicillins Anaphylaxis    Has patient had a PCN reaction causing immediate rash, facial/tongue/throat swelling, SOB or lightheadedness with hypotension:yes Has patient had a PCN reaction causing severe rash involving mucus membranes or skin necrosis: unknown Has patient had a PCN reaction that required hospitalization yes  Has patient had a PCN reaction occurring within the last 10 years: no If all of the above answers are "NO", then may proceed with Cephalosporin use.   . Baby Powder [Methylbenzethonium] Hives  . Ibuprofen Hives  . Doxycycline Diarrhea and Rash   Patient Active Problem List   Diagnosis Date Noted  . Mass of ureter s/p resection 05/17/2017 05/18/2017  . Atrophic kidney s/p right nepherctomy 05/17/2017 05/18/2017  . GERD (gastroesophageal reflux disease)   . Complication of anesthesia   . Arthritis   . Gastric tumor s/p  partial gastrectomy 05/17/2016 05/17/2017  . Solitary pulmonary nodule 02/18/2017  . Genetic testing 04/04/2016  . Family history of colon cancer 03/14/2016  . Family history of breast cancer in female 03/14/2016  . Morbid obesity due to excess calories (HCC) 01/18/2012  . Anxiety state 04/20/2008  . Cough variant asthma 04/20/2008  . LIVER FUNCTION TESTS, ABNORMAL 04/20/2008  . Attention deficit hyperactivity disorder (ADHD) 06/10/2007   Social History   Socioeconomic History  . Marital status: Married    Spouse name: Not on file  . Number of children: Not on file  . Years of education: Not on file  . Highest education level: Not on file  Social Needs  . Financial resource strain: Not on file  . Food insecurity - worry: Not on file  . Food insecurity - inability: Not on file  . Transportation needs - medical: Not on file  . Transportation needs - non-medical: Not on file  Occupational History  . Not on file  Tobacco Use  . Smoking status: Never Smoker  . Smokeless tobacco: Never Used  Substance and Sexual Activity  . Alcohol use: No  . Drug use: No  .   Sexual activity: Yes    Birth control/protection: Surgical  Other Topics Concern  . Not on file  Social History Narrative  . Not on file    Ms. Garcia [name]'s family history includes Breast cancer in her maternal grandmother, paternal aunt, and sister; Cancer (age of onset: 28) in her mother; Cancer (age of onset: 45) in her sister; Cervical cancer in her sister; Cervical cancer (age of onset: 25) in her other; Colon cancer in her paternal aunt, paternal uncle, and sister; Colon cancer (age of onset: 55) in her father; Colon cancer (age of onset: 7) in her sister; Colon polyps in her sister, sister, and sister; Colon polyps (age of onset: 19) in her son; Diabetes in her maternal grandmother and paternal grandfather; Hypertension in her father and mother; Other in her mother; Renal cancer (age of onset: 44) in her sister.        Objective:    Vitals:   11/19/17 0826  BP: 114/80  Pulse: 76    Physical Exam well-developed white female in no acute distress, blood pressure 114/80, height 5 foot, weight 176, BMI 34.3.  HEENT ;nontraumatic normocephalic EOMI PERRLA sclera anicteric, Cardiovascular ;regular rate and rhythm with S1-S2 no murmur rub or gallop, Pulmonary ;clear bilaterally, Abdomen; soft, bowel sounds are present she has mild tenderness in the right mid quadrant and epigastrium there is no guarding or rebound no palpable mass or hepatosplenomegaly bowel sounds are present, Rectal; exam not done, Extremities; no clubbing cyanosis or edema skin warm and dry, Neuro psych; mood and affect appropriate       Assessment & Plan:   #89-year-old white female with family history of colon cancer in her father, last colonoscopy 2009 was unremarkable.  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  #4  2-3-week history of recurrent abdominal pain right mid and epigastric intermittent of unclear etiology. #4 anxiety 5.  Obesity on phentermine 6.  Asthma 7.  Status post cholecystectomy  Plan; patient will be scheduled for CT scan of the abdomen and pelvis with contrast CBC with differential, chemistries, sed rate Patient will be scheduled for colonoscopy and EGD with Dr. Stark.  Both procedures were discussed with patient in detail including indications risks and benefits and she is agreeable to proceed. Phentermine will be held for 10 days prior to endoscopic evaluation. Further workup pending results of above.  I do not believe patient has been evaluated by GYN, will wait until above workup is complete and then decide if appropriate to   to GYN.  Amy S Esterwood PA-C 11/19/2017   Cc: Kwiatkowski, Peter F, MD  

## 2017-11-19 NOTE — Patient Instructions (Addendum)
We sent a script for Zofran to your pharmacy. Dissolve 1 tablet on the tongue 30 min before drinking the doses of the prep.  Please go to the basement level to have your labs drawn.   You have been scheduled for an endoscopy and colonoscopy.. Please follow written instructions given to you at your visit today. We sent the  Prescription for the prep to your pharmacy. We have provided you with a coupon to give to the pharmacy. If you use inhalers (even only as needed), please bring them with you on the day of your procedure. Your physician has requested that you go to www.startemmi.com and enter the access code given to you at your visit today. This web site gives a general overview about your procedure. However, you should still follow specific instructions given to you by our office regarding your preparation for the procedure.   You have been scheduled for a CT scan of the abdomen and pelvis at [facility] ([address]---this is in the same building as [facility]).   You are scheduled on Wednesday 11-27-2017 at 10:00 am. You should arrive 15 minutes prior to your appointment time for registration. Please follow the written instructions below on the day of your exam:  WARNING: IF YOU ARE ALLERGIC TO IODINE/X-RAY DYE, PLEASE NOTIFY RADIOLOGY IMMEDIATELY AT [phone]! YOU WILL BE GIVEN A 13 HOUR PREMEDICATION PREP.  1) Do not eat  anything after 6:00 am, (4 hours prior to your test) 2) You have been given 2 bottles of oral contrast to drink. The solution may taste               better if refrigerated, but do NOT add ice or any other liquid to this solution. Shake             well before drinking.    Drink 1 bottle of contrast @ 8:00 am (2 hours prior to your exam)  Drink 1 bottle of contrast @ 9:00 am (1 hour prior to your exam)  You may take any medications as prescribed with a small amount of water except for the following: Metformin, Glucophage, Glucovance, Avandamet,  Riomet, Fortamet, Actoplus Met, Janumet, Glumetza or Metaglip. The above medications must be held the day of the exam AND 48 hours after the exam.  The purpose of you drinking the oral contrast is to aid in the visualization of your intestinal tract. The contrast solution may cause some diarrhea. Before your exam is started, you will be given a small amount of fluid to drink. Depending on your individual set of symptoms, you may also receive an intravenous injection of x-ray contrast/dye. Plan on being at [facility] for 30 minutes or long, depending on the type of exam you are having performed.  If you have any questions regarding your exam or if you need to reschedule, you may call the CT department at [phone] between the hours of 8:00 am and 5:00 pm, Monday-Friday.   Do not take Phentermine after 12-07-2017 prior to the colonoscopy/EGD.   I  If you are age 49 or younger, your body mass index should be between 19-25. Your Body mass index is 34.37 kg/m. If this is out of the aformentioned range listed, please consider follow up with your Primary Care Provider.   ________________________________________________________________________  

## 2017-11-19 NOTE — Telephone Encounter (Signed)
The patient went to our lab and I called our [facility] and spoke to the patient.  I advised her not to take the Phentermine on 2-2 and after for 10 days for the Colon and EGD. She verbalized understanding the instructions.

## 2017-11-19 NOTE — Progress Notes (Signed)
Reviewed and agree with management plan.  Artice Bergerson T. Sabel Hornbeck, MD FACG 

## 2017-11-22 ENCOUNTER — Telehealth: Payer: Self-pay | Admitting: *Deleted

## 2017-11-22 NOTE — Telephone Encounter (Signed)
LM for the Medical Records department at [facility]. Asked that they fax the post op office note from Dr. Zella Richer to [place].

## 2017-11-22 NOTE — Telephone Encounter (Signed)
I called and LM with [place] Records Department. I asked for them to fax the office visit with Dr. Zella Richer on 06-11-2017 to me at [phone].

## 2017-11-27 ENCOUNTER — Ambulatory Visit (INDEPENDENT_AMBULATORY_CARE_PROVIDER_SITE_OTHER)
Admission: RE | Admit: 2017-11-27 | Discharge: 2017-11-27 | Disposition: A | Discharge status: Home / Self Care | Payer: PRIVATE HEALTH INSURANCE | Source: Ambulatory Visit | Attending: Physician Assistant | Admitting: Physician Assistant

## 2017-11-27 DIAGNOSIS — K319 Disease of stomach and duodenum, unspecified: Secondary | ICD-10-CM

## 2017-11-27 DIAGNOSIS — R1011 Right upper quadrant pain: Secondary | ICD-10-CM | POA: Diagnosis not present

## 2017-11-27 DIAGNOSIS — R1013 Epigastric pain: Secondary | ICD-10-CM

## 2017-11-27 DIAGNOSIS — Z9049 Acquired absence of other specified parts of digestive tract: Secondary | ICD-10-CM

## 2017-11-27 DIAGNOSIS — Z8742 Personal history of other diseases of the female genital tract: Secondary | ICD-10-CM

## 2017-11-27 DIAGNOSIS — Z800 Family history of malignant neoplasm of digestive organs: Secondary | ICD-10-CM

## 2017-11-27 DIAGNOSIS — K3189 Other diseases of stomach and duodenum: Secondary | ICD-10-CM

## 2017-11-27 MED ORDER — IOPAMIDOL (ISOVUE-300) INJECTION 61%
100.0000 mL | Freq: Once | INTRAVENOUS | Status: AC | PRN
  Administered 2017-11-27: 100 mL via INTRAVENOUS

## 2017-12-04 ENCOUNTER — Encounter: Payer: Self-pay | Admitting: Gastroenterology

## 2017-12-11 IMAGING — CT CT ABD-PELV W/ CM
2 of 5 series · 15 of 46 positions shown, 17 images · IV contrast (APPLIED)
Comparison: 05/24/2017 CT abdomen/pelvis.

CLINICAL DATA: Status post resection of lesser curvature gastric
mass demonstrating benign gastric mucosa with ectopic pancreas, and
right nephroureterectomy with right ureteral endometriosis on
05/17/2017. Patient presents with abdominal pain.

EXAM:
CT ABDOMEN AND PELVIS WITH CONTRAST
TECHNIQUE: Multidetector CT imaging of the abdomen and pelvis was performed
using the standard protocol following bolus administration of
intravenous contrast.
CONTRAST:  100mL [id number] IOPAMIDOL ([id number]) INJECTION 61%

[Series 3: abdomen 5.0 · axial · 0.76mm/px · z∈[+864,+1249]mm · 12 of 89 slices shown, 14 images]
[im 6/89  soft-tissue]
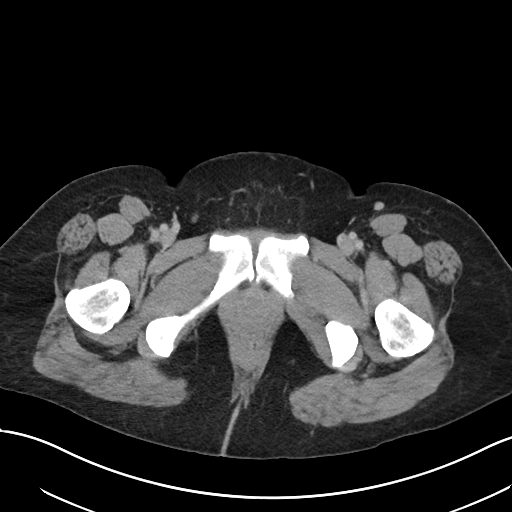
[im 6/89  bone]
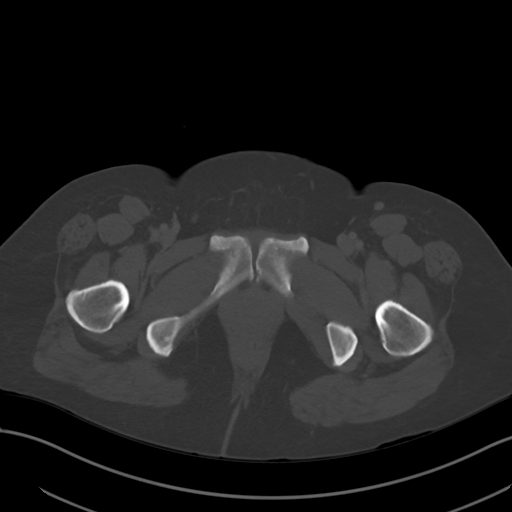
[im 12/89  soft-tissue]
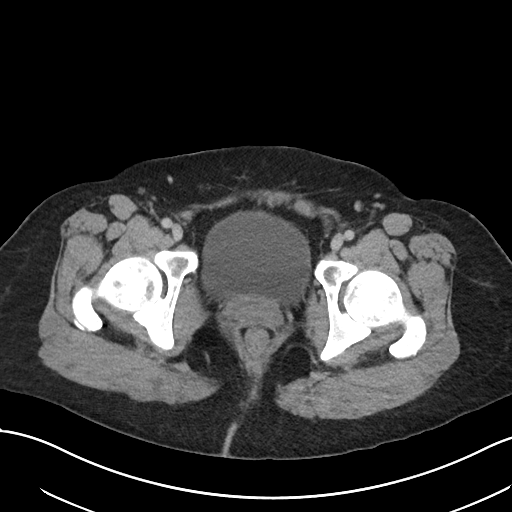
[im 23/89  soft-tissue]
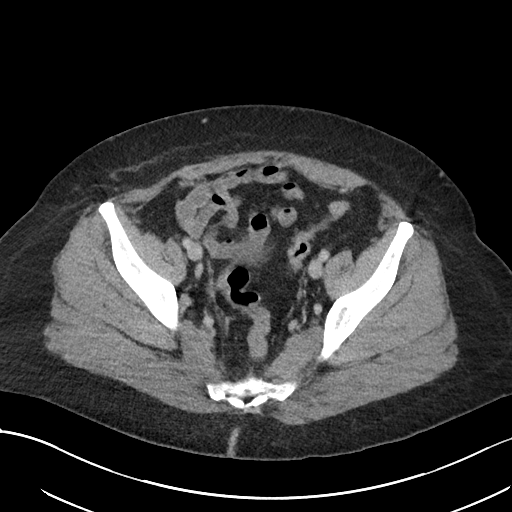
[im 28/89  soft-tissue]
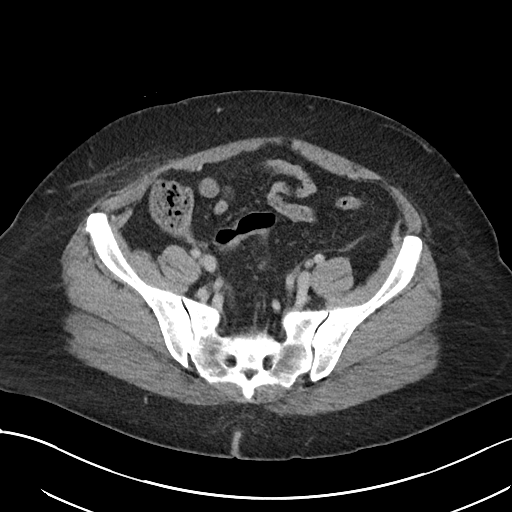
[im 34/89  soft-tissue]
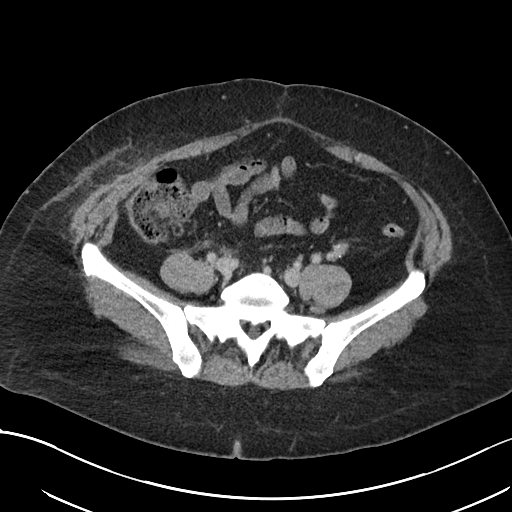
[im 39/89  soft-tissue]
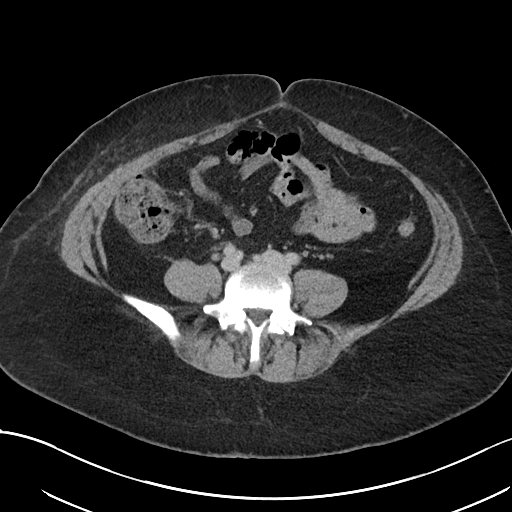
[im 50/89  soft-tissue]
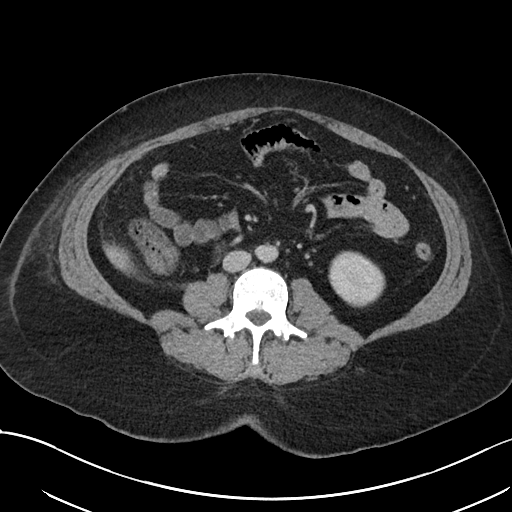
[im 56/89  soft-tissue]
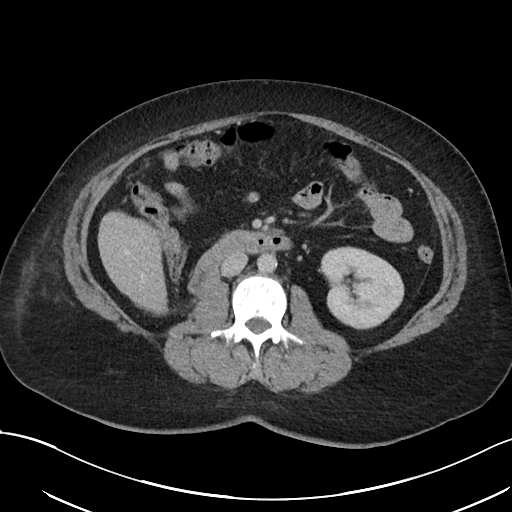
[im 61/89  soft-tissue]
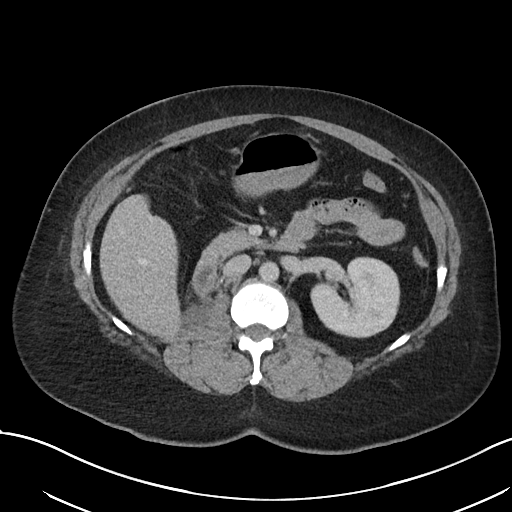
[im 61/89  bone]
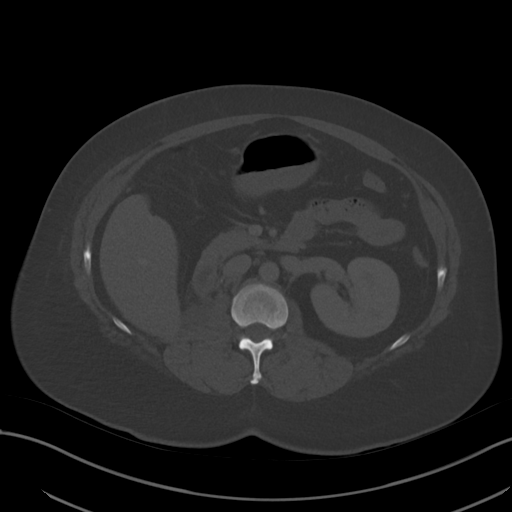
[im 67/89  soft-tissue]
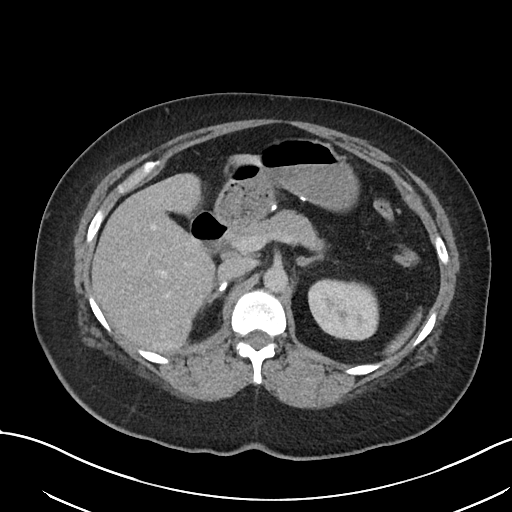
[im 78/89  soft-tissue]
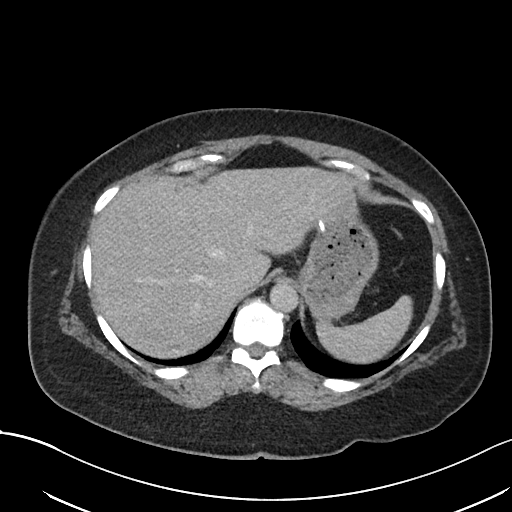
[im 83/89  soft-tissue]
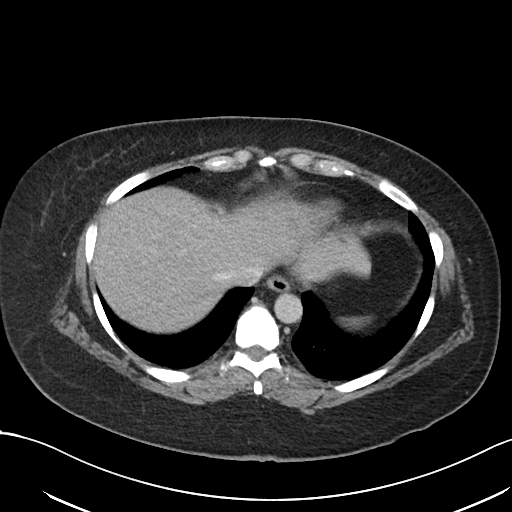

[Series 6: abdomen 3.0 mpr cor · coronal · 0.66mm/px · 3 of 101 slices shown]
[im 34/101  soft-tissue]
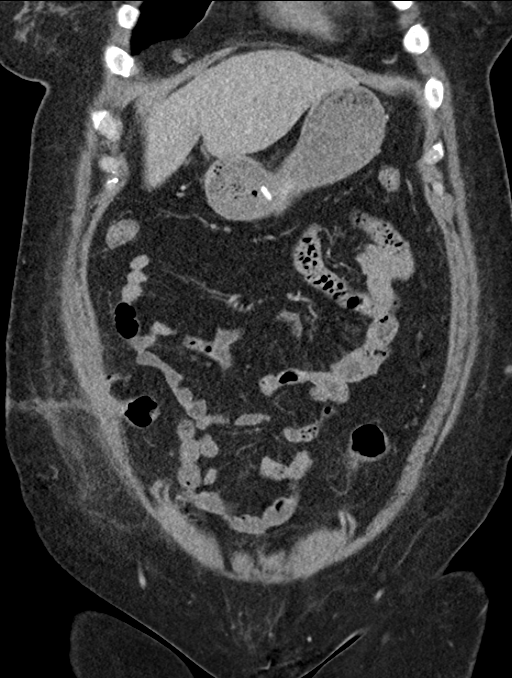
[im 45/101  soft-tissue]
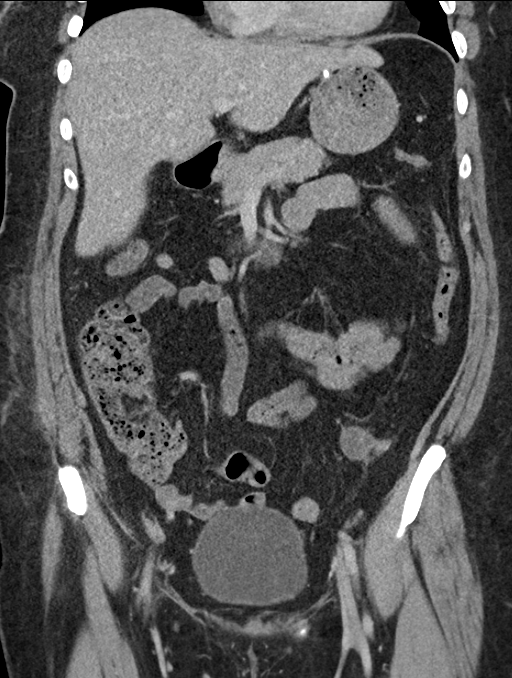
[im 56/101  soft-tissue]
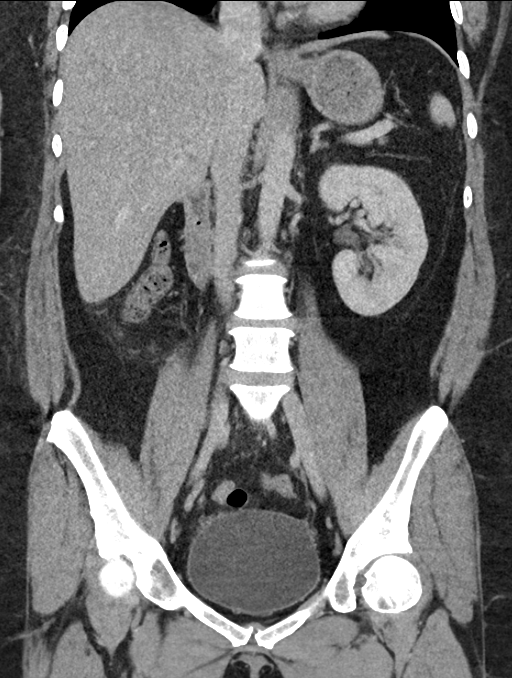

[15 of 46 positions shown; findings below may reference images not displayed]

FINDINGS: Lower chest: No significant pulmonary nodules or acute consolidative
airspace disease.

Hepatobiliary: Normal liver with no liver mass. Cholecystectomy. No
biliary ductal dilatation. Common bile duct diameter 4 mm .

Pancreas: Normal, with no mass or duct dilation.

Spleen: Normal size. No mass.

Adrenals/Urinary Tract: Normal adrenals. Status post right
nephrectomy. Small 2.4 x 1.4 cm right nephrectomy bed fluid
collection (series 3/ image 28) is decreased from 3.6 x 2.9 cm on
05/24/2017, and demonstrates no wall thickening or enhancement, with
resolution of the previously visualized internal gas. No new fluid
collections in the right nephrectomy bed. Normal left kidney with no
left hydronephrosis and no left renal masses. Normal caliber left
ureter. Normal bladder.

Stomach/Bowel: Stable postsurgical changes along the lesser
curvature of the stomach from partial gastrectomy, with no
significant gastric distention and no acute gastric abnormality.
Normal caliber small bowel with no small bowel wall thickening.
Normal appendix. Normal large bowel with no diverticulosis, large
bowel wall thickening or pericolonic fat stranding.

Vascular/Lymphatic: Atherosclerotic nonaneurysmal abdominal aorta.
Patent portal, splenic, hepatic and left renal veins. No
pathologically enlarged lymph nodes in the abdomen or pelvis.

Reproductive: Status post hysterectomy, with no mass or fluid
collection at the vaginal cuff. No adnexal mass.

Other: No pneumoperitoneum. Interval resolution of previously
visualized ill-defined fluid in the deep pelvis. No new focal fluid
collections. Fat stranding in the ventral right abdominal wall is
slightly decreased. Previously visualized subcutaneous emphysema has
resolved. No superficial fluid collections.

Musculoskeletal: No aggressive appearing focal osseous lesions.
Minimal thoracolumbar spondylosis.
IMPRESSION: 1. Small fluid collection in the right nephrectomy bed is decreased
in size since 05/24/2017 with resolved internal gas, most compatible
with a resolving postoperative fluid collection. No CT features to
suggest an abscess.
2. Resolved fluid in the deep pelvis. Resolving postsurgical changes
in the right ventral abdominal wall. No new fluid collections.
3. No evidence of bowel obstruction or acute bowel inflammation. No
free air.
4.  Aortic Atherosclerosis ([id number].X).

## 2017-12-17 ENCOUNTER — Other Ambulatory Visit: Payer: Self-pay

## 2017-12-17 ENCOUNTER — Encounter: Payer: Self-pay | Admitting: Gastroenterology

## 2017-12-17 ENCOUNTER — Ambulatory Visit (AMBULATORY_SURGERY_CENTER): Payer: PRIVATE HEALTH INSURANCE | Admitting: Gastroenterology

## 2017-12-17 VITALS — BP 125/68 | HR 74 | Temp 97.5°F | Resp 13 | Ht 60.0 in | Wt 176.0 lb

## 2017-12-17 DIAGNOSIS — Z789 Other specified health status: Secondary | ICD-10-CM

## 2017-12-17 DIAGNOSIS — Z1211 Encounter for screening for malignant neoplasm of colon: Secondary | ICD-10-CM | POA: Diagnosis not present

## 2017-12-17 DIAGNOSIS — Z800 Family history of malignant neoplasm of digestive organs: Secondary | ICD-10-CM | POA: Diagnosis present

## 2017-12-17 DIAGNOSIS — R1013 Epigastric pain: Secondary | ICD-10-CM | POA: Diagnosis not present

## 2017-12-17 DIAGNOSIS — K317 Polyp of stomach and duodenum: Secondary | ICD-10-CM | POA: Diagnosis not present

## 2017-12-17 HISTORY — PX: COLONOSCOPY: SHX174

## 2017-12-17 MED ORDER — DICYCLOMINE HCL 10 MG PO CAPS: 10.0000 mg | ORAL_CAPSULE | Freq: Three times a day (TID) | ORAL | 2 refills | Status: DC

## 2017-12-17 MED ORDER — SODIUM CHLORIDE 0.9 % IV SOLN: 500.0000 mL | Freq: Once | INTRAVENOUS | Status: DC

## 2017-12-17 NOTE — Op Note (Signed)
[facility] Patient Name: [name] Procedure Date: 12/17/2017 2:59 PM MRN: [id number] Endoscopist: Ladene Artist , MD Age: 47 Referring MD:  Date of Birth: November 24, 1970 Gender: Female Account #: [id number] Procedure:                Upper GI endoscopy Indications:              Epigastric abdominal pain Medicines:                Monitored Anesthesia Care Procedure:                Pre-Anesthesia Assessment:                           - Prior to the procedure, a History and Physical                            was performed, and patient medications and                            allergies were reviewed. The patient's tolerance of                            previous anesthesia was also reviewed. The risks                            and benefits of the procedure and the sedation                            options and risks were discussed with the patient.                            All questions were answered, and informed consent                            was obtained. Prior Anticoagulants: The patient has                            taken no previous anticoagulant or antiplatelet                            agents. ASA Grade Assessment: II - A patient with                            mild systemic disease. After reviewing the risks                            and benefits, the patient was deemed in                            satisfactory condition to undergo the procedure.                           After obtaining informed consent, the endoscope was  passed under direct vision. Throughout the                            procedure, the patient's blood pressure, pulse, and                            oxygen saturations were monitored continuously. The                            Endoscope was introduced through the mouth, and                            advanced to the second part of duodenum. The upper                            GI endoscopy was  accomplished without difficulty.                            The patient tolerated the procedure well. Scope In: Scope Out: Findings:                 The examined esophagus was normal.                           Three 5 mm mucosal papules (nodules) with no                            stigmata of recent bleeding were found at the                            incisura and in the gastric antrum. Nodules at the                            ends of healed linear surgical incision. 2 at the                            proximal end and 1 at the distal end. Biopsies were                            taken with a cold forceps for histology.                           Multiple 4 to 7 mm pedunculated and sessile polyps                            with no stigmata of recent bleeding were found in                            the gastric fundus and in the gastric body.                            Biopsies were taken with a cold forceps for  histology.                           The exam of the stomach was otherwise normal.                           The duodenal bulb and second portion of the                            duodenum were normal. Complications:            No immediate complications. Estimated Blood Loss:     Estimated blood loss: none. Estimated blood loss                            was minimal. Impression:               - Normal esophagus.                           - Three mucosal papules (nodules) found in the                            stomach at ends of healed surigcal incision.                            Biopsied.                           - Multiple gastric polyps. Biopsied.                           - Normal duodenal bulb and second portion of the                            duodenum. Recommendation:           - Patient has a contact number available for                            emergencies. The signs and symptoms of potential                            delayed  complications were discussed with the                            patient. Return to normal activities tomorrow.                            Written discharge instructions were provided to the                            patient.                           - Resume previous diet.                           -  Continue present medications.                           - Await pathology results. Ladene Artist, MD 12/17/2017 3:32:49 PM This report has been signed electronically.

## 2017-12-17 NOTE — Progress Notes (Signed)
Called to room to assist during endoscopic procedure.  Patient ID and intended procedure confirmed with present staff. Received instructions for my participation in the procedure from the performing physician.  

## 2017-12-17 NOTE — Op Note (Signed)
[facility] Patient Name: [name] Procedure Date: 12/17/2017 2:59 PM MRN: [id number] Endoscopist: Ladene Artist , MD Age: 47 Referring MD:  Date of Birth: [birth year] Gender: Female Account #: [id number] Procedure:                Colonoscopy Indications:              Screening in patient at increased risk: Family                            history of 1st-degree relative with colorectal                            cancer Medicines:                Monitored Anesthesia Care Procedure:                Pre-Anesthesia Assessment:                           - Prior to the procedure, a History and Physical                            was performed, and patient medications and                            allergies were reviewed. The patient's tolerance of                            previous anesthesia was also reviewed. The risks                            and benefits of the procedure and the sedation                            options and risks were discussed with the patient.                            All questions were answered, and informed consent                            was obtained. Prior Anticoagulants: The patient has                            taken no previous anticoagulant or antiplatelet                            agents. ASA Grade Assessment: II - A patient with                            mild systemic disease. After reviewing the risks                            and benefits, the patient was deemed in  satisfactory condition to undergo the procedure.                           After obtaining informed consent, the colonoscope                            was passed under direct vision. Throughout the                            procedure, the patient's blood pressure, pulse, and                            oxygen saturations were monitored continuously. The                            Colonoscope was introduced through the anus and                       advanced to the the cecum, identified by                            appendiceal orifice and ileocecal valve. The                            ileocecal valve, appendiceal orifice, and rectum                            were photographed. The quality of the bowel                            preparation was good. The colonoscopy was performed                            without difficulty. The patient tolerated the                            procedure well. Scope In: 3:18:51 PM Scope Out: 3:29:03 PM Scope Withdrawal Time: 0 hours 7 minutes 48 seconds  Total Procedure Duration: 0 hours 10 minutes 12 seconds  Findings:                 The perianal and digital rectal examinations were                            normal.                           The entire examined colon appeared normal on direct                            and retroflexion views. Complications:            No immediate complications. Estimated blood loss:                            None. Estimated Blood Loss:     Estimated blood loss: none. Impression:               -  The entire examined colon is normal on direct and                            retroflexion views.                           - No specimens collected. Recommendation:           - Repeat colonoscopy in 5 years for screening                            purposes.                           - Patient has a contact number available for                            emergencies. The signs and symptoms of potential                            delayed complications were discussed with the                            patient. Return to normal activities tomorrow.                            Written discharge instructions were provided to the                            patient.                           - Resume previous diet.                           - Continue present medications. Ladene Artist, MD 12/17/2017 3:35:59 PM This report has been signed  electronically.

## 2017-12-17 NOTE — Progress Notes (Signed)
Pt's states no medical or surgical changes since previsit or office visit. 

## 2017-12-17 NOTE — Progress Notes (Signed)
Report to PACU, RN, vss, BBS= Clear.  

## 2017-12-17 NOTE — Patient Instructions (Signed)
YOU HAD AN ENDOSCOPIC PROCEDURE TODAY AT [facility]:   Refer to the procedure report that was given to you for any specific questions about what was found during the examination.  If the procedure report does not answer your questions, please call your gastroenterologist to clarify.  If you requested that your care partner not be given the details of your procedure findings, then the procedure report has been included in a sealed envelope for you to review at your convenience later.  YOU SHOULD EXPECT: Some feelings of bloating in the abdomen. Passage of more gas than usual.  Walking can help get rid of the air that was put into your GI tract during the procedure and reduce the bloating. If you had a lower endoscopy (such as a colonoscopy or flexible sigmoidoscopy) you may notice spotting of blood in your stool or on the toilet paper. If you underwent a bowel prep for your procedure, you may not have a normal bowel movement for a few days.  Please Note:  You might notice some irritation and congestion in your nose or some drainage.  This is from the oxygen used during your procedure.  There is no need for concern and it should clear up in a day or so.  SYMPTOMS TO REPORT IMMEDIATELY:   Following lower endoscopy (colonoscopy or flexible sigmoidoscopy):  Excessive amounts of blood in the stool  Significant tenderness or worsening of abdominal pains  Swelling of the abdomen that is new, acute  Fever of 100F or higher   Following upper endoscopy (EGD)  Vomiting of blood or coffee ground material  New chest pain or pain under the shoulder blades  Painful or persistently difficult swallowing  New shortness of breath  Fever of 100F or higher  Black, tarry-looking stools  For urgent or emergent issues, a gastroenterologist can be reached at any hour by calling [phone].   DIET:  We do recommend a small meal at first, but then you may proceed to your regular diet.  Drink  plenty of fluids but you should avoid alcoholic beverages for 24 hours.  ACTIVITY:  You should plan to take it easy for the rest of today and you should NOT DRIVE or use heavy machinery until tomorrow (because of the sedation medicines used during the test).    FOLLOW UP: Our staff will call the number listed on your records the next business day following your procedure to check on you and address any questions or concerns that you may have regarding the information given to you following your procedure. If we do not reach you, we will leave a message.  However, if you are feeling well and you are not experiencing any problems, there is no need to return our call.  We will assume that you have returned to your regular daily activities without incident.  If any biopsies were taken you will be contacted by phone or by letter within the next 1-3 weeks.  Please call us at [phone] if you have not heard about the biopsies in 3 weeks.    SIGNATURES/CONFIDENTIALITY: You and/or your care partner have signed paperwork which will be entered into your electronic medical record.  These signatures attest to the fact that that the information above on your After Visit Summary has been reviewed and is understood.  Full responsibility of the confidentiality of this discharge information lies with you and/or your care-partner.   Resume medications.

## 2017-12-18 ENCOUNTER — Telehealth: Payer: Self-pay | Admitting: *Deleted

## 2017-12-18 NOTE — Telephone Encounter (Signed)
  Follow up Call-  Call back number 12/17/2017  Post procedure Call Back phone  # [phone]  Permission to leave phone message Yes  Some recent data might be hidden     Patient questions:  Do you have a fever, pain , or abdominal swelling? No. Pain Score  0 *  Have you tolerated food without any problems? Yes.  Have you been able to return to your normal activities? Yes.    Do you have any questions about your discharge instructions: Diet   No. Medications  No. Follow up visit  No.  Do you have questions or concerns about your Care? No.  Actions: * If pain score is 4 or above: No action needed, pain <4.

## 2017-12-31 ENCOUNTER — Telehealth: Payer: Self-pay | Admitting: Gastroenterology

## 2017-12-31 NOTE — Telephone Encounter (Signed)
Patient calling requesting results of biopsy from EGD 12/17/17

## 2018-01-01 NOTE — Telephone Encounter (Signed)
All questions answered

## 2018-01-01 NOTE — Telephone Encounter (Signed)
Pt states she has question about biopsy results. Best call back # [phone].

## 2018-01-29 ENCOUNTER — Telehealth: Payer: Self-pay | Admitting: Gastroenterology

## 2018-01-29 MED ORDER — PANTOPRAZOLE SODIUM 40 MG PO TBEC
40.0000 mg | DELAYED_RELEASE_TABLET | Freq: Two times a day (BID) | ORAL | 3 refills | Status: DC
Start: 2018-01-29 — End: 2019-04-06

## 2018-01-29 NOTE — Telephone Encounter (Signed)
Patient notified

## 2018-01-29 NOTE — Telephone Encounter (Signed)
Patient is asking if she can have a rx for BID protonix?   She is having continued pain and nausea and BID protonix has helped.  She has too many side effects from dicyclomine.

## 2018-01-29 NOTE — Telephone Encounter (Signed)
increase pantoprazole to 40 mg po bid

## 2018-01-29 NOTE — Telephone Encounter (Signed)
Patient has some questions, please call her back.

## 2018-05-06 ENCOUNTER — Other Ambulatory Visit: Payer: Self-pay | Admitting: Internal Medicine

## 2018-05-06 DIAGNOSIS — Z1231 Encounter for screening mammogram for malignant neoplasm of breast: Secondary | ICD-10-CM

## 2018-05-26 ENCOUNTER — Ambulatory Visit
Admission: RE | Admit: 2018-05-26 | Discharge: 2018-05-26 | Disposition: A | Discharge status: Home / Self Care | Payer: PRIVATE HEALTH INSURANCE | Source: Ambulatory Visit | Attending: Internal Medicine | Admitting: Internal Medicine

## 2018-05-26 DIAGNOSIS — Z1231 Encounter for screening mammogram for malignant neoplasm of breast: Secondary | ICD-10-CM

## 2018-06-30 ENCOUNTER — Telehealth: Payer: Self-pay | Admitting: Internal Medicine

## 2018-06-30 NOTE — Telephone Encounter (Signed)
Copied from [place] [id number]. Topic: Quick Communication - Rx Refill/Question >> Jun 30, 2018  3:57 PM Blase Mess A wrote: Medication: pantoprazole (PROTONIX) 40 MG tablet [[id number]]   Has the patient contacted their pharmacy? Yes (Agent: If no, request that the patient contact the pharmacy for the refill.) (Agent: If yes, when and what did the pharmacy advise?)  Preferred Pharmacy (with phone number or street name):  Agent: Please be advised that RX refills may take up to 3 business days. We ask that you follow-up with your pharmacy.

## 2018-06-30 NOTE — Telephone Encounter (Signed)
Attempted to call pt regarding refill request but unable to leave a message due to voicemail mailbox has not being set up. Medication was previously filled by GI, Dr. Fuller Plan on 01/29/18 #180 with 3 refills.

## 2018-07-01 NOTE — Telephone Encounter (Signed)
Noted! No refills are needed!

## 2018-07-14 ENCOUNTER — Other Ambulatory Visit: Payer: Self-pay | Admitting: Obstetrics and Gynecology

## 2018-09-01 ENCOUNTER — Ambulatory Visit: Payer: PRIVATE HEALTH INSURANCE

## 2018-09-03 ENCOUNTER — Ambulatory Visit: Payer: Self-pay

## 2018-10-14 ENCOUNTER — Ambulatory Visit: Payer: Self-pay

## 2018-10-14 ENCOUNTER — Ambulatory Visit (INDEPENDENT_AMBULATORY_CARE_PROVIDER_SITE_OTHER): Payer: Self-pay | Admitting: *Deleted

## 2018-10-14 DIAGNOSIS — Z23 Encounter for immunization: Secondary | ICD-10-CM

## 2019-04-06 ENCOUNTER — Other Ambulatory Visit: Payer: Self-pay | Admitting: Gastroenterology

## 2019-04-06 ENCOUNTER — Telehealth: Payer: Self-pay | Admitting: Gastroenterology

## 2019-04-06 NOTE — Telephone Encounter (Signed)
Pt Is needing refill of medication sent to [facility], [place], [place] [address]   [phone]

## 2019-04-06 NOTE — Telephone Encounter (Signed)
Prescription sent to patient's pharmacy and patient notified to keep appt for further refills. Patient verbalized understanding.

## 2019-04-08 ENCOUNTER — Other Ambulatory Visit: Payer: Self-pay

## 2019-04-09 ENCOUNTER — Ambulatory Visit (INDEPENDENT_AMBULATORY_CARE_PROVIDER_SITE_OTHER): Payer: Self-pay | Admitting: Gastroenterology

## 2019-04-09 ENCOUNTER — Encounter: Payer: Self-pay | Admitting: Gastroenterology

## 2019-04-09 ENCOUNTER — Other Ambulatory Visit: Payer: Self-pay

## 2019-04-09 DIAGNOSIS — I809 Phlebitis and thrombophlebitis of unspecified site: Secondary | ICD-10-CM

## 2019-04-09 DIAGNOSIS — K219 Gastro-esophageal reflux disease without esophagitis: Secondary | ICD-10-CM

## 2019-04-09 DIAGNOSIS — Z800 Family history of malignant neoplasm of digestive organs: Secondary | ICD-10-CM

## 2019-04-09 MED ORDER — PANTOPRAZOLE SODIUM 40 MG PO TBEC: 40.0000 mg | DELAYED_RELEASE_TABLET | Freq: Two times a day (BID) | ORAL | 3 refills | Status: DC

## 2019-04-09 MED ORDER — CLARITHROMYCIN 500 MG PO TABS: 500.0000 mg | ORAL_TABLET | Freq: Two times a day (BID) | ORAL | 0 refills | Status: AC

## 2019-04-09 NOTE — Progress Notes (Signed)
    History of Present Illness: This is a 48 year old female with GERD returning for follow-up.  She states she requires pantoprazole twice daily to keep reflux symptoms under good control and once daily has not been effective.  She is concerned about her strong family history of colon cancer and maintaining close follow-up for colonoscopies.  She relates she had her toenails done a couple days ago and now she has small reddish streaks on the tops of her foot extending to her ankles.  She attempted to show me video images with her phone which were difficult to interpret with the lighting and video quality but appeared to be mild phlebitis.  EGD 12/2017 - Normal esophagus. - Three mucosal papules (nodules) found in the stomach at ends of healed surigcal incision. Biopsied. Hyperplastic with reactive changes - Multiple gastric polyps. Biopsied. Benign fundic gland polyps.  - Normal duodenal bulb and second portion of the duodenum.  Colon 12/2017 Normal   Current Medications, Allergies, Past Medical History, Past Surgical History, Family History and Social History were reviewed in [employer] record.   Physical Exam: Telemedicine - not performed   Assessment and Recommendations:  1. GERD. Pantoprazole 40 mg po bid for 1 year and follow standard antireflux measures.   2. 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  3. Strong family history of colon cancer. Father, 3 paternal uncles. Sister with colon polyps. High risk screening so will plan for a 2 year interval screening with next colonoscopy in 12/2019. If no precancerous polyps found consider a 3 year interval.   4. Suspected phlebitis.  Advised to elevate lower legs for several hours per day as possible until symptoms have resolved. Given allergies Biaxin 500 mg po bid, #14. Advised to contact PCP if symptoms not rapidly resolving.    These services were provided via  telemedicine, audio and video.  The patient was at home and the provider was in the office, alone.  We discussed the limitations of evaluation and management by telemedicine and the availability of in person appointments.  Patient consented for this telemedicine visit and is aware of possible charges for this service.  Office CMA or LPN participated in this telemedicine service.  Time spent on call: 15 minutes

## 2019-04-09 NOTE — Patient Instructions (Addendum)
We have sent the following medications to your pharmacy for you to pick up at your convenience: pantoprazole and Biaxin.  Please elevated lower legs several hours during the day above heart level.   Contact PCP if your symptoms are not resolved.   Patient advised to avoid spicy, acidic, citrus, chocolate, mints, fruit and fruit juices.  Limit the intake of caffeine, alcohol and Soda.  Don't exercise too soon after eating.  Don't lie down within 3-4 hours of eating.  Elevate the head of your bed.   Thank you for choosing me and [facility].  Pricilla Riffle. Dagoberto Ligas., MD., Marval Regal

## 2019-07-27 ENCOUNTER — Telehealth: Payer: Self-pay | Admitting: Internal Medicine

## 2019-07-27 NOTE — Telephone Encounter (Signed)
Spoke with pt and advised. She would like to come pick up a copy for her records. Advised pt she will need to sign release. She will stop by this afternoon.

## 2019-07-27 NOTE — Telephone Encounter (Signed)
Pt received last TDAP 05/2013 so she is covered until 2024, unless has injury and needs booster sooner.

## 2019-07-27 NOTE — Telephone Encounter (Signed)
Patient is calling to ask how long is TDAP for? And when was the last time she had a TDAP shot. She was told that before she can see her grandbaby until she has had the shot. Patient that she has reestablished care with [facility]. Please advise CB- [phone]

## 2019-07-29 NOTE — Telephone Encounter (Signed)
Patient picked up records

## 2019-11-03 ENCOUNTER — Telehealth: Payer: Self-pay

## 2019-11-03 NOTE — Telephone Encounter (Signed)
Copied from [place] [id number]. Topic: General - Other >> Nov 03, 2019  9:51 AM Yvette Rack wrote: Reason for CRM: Pt stated she went to [facility] and was prescribed some medication but the doctor who prescribed it is not there so she would like to ask Dr. Volanda Napoleon or her nurse to call her to discuss possibly refilling the medication. Pt requests call back. Cb# [phone]

## 2019-11-04 ENCOUNTER — Telehealth: Payer: Self-pay

## 2019-11-04 ENCOUNTER — Other Ambulatory Visit: Payer: Self-pay

## 2019-11-04 ENCOUNTER — Ambulatory Visit (INDEPENDENT_AMBULATORY_CARE_PROVIDER_SITE_OTHER): Payer: Self-pay | Admitting: Internal Medicine

## 2019-11-04 ENCOUNTER — Emergency Department (HOSPITAL_BASED_OUTPATIENT_CLINIC_OR_DEPARTMENT_OTHER)
Admission: EM | Admit: 2019-11-04 | Discharge: 2019-11-05 | Disposition: A | Discharge status: Home / Self Care | Payer: 59 | Attending: Emergency Medicine | Admitting: Emergency Medicine

## 2019-11-04 ENCOUNTER — Encounter (HOSPITAL_BASED_OUTPATIENT_CLINIC_OR_DEPARTMENT_OTHER): Payer: Self-pay

## 2019-11-04 ENCOUNTER — Telehealth (INDEPENDENT_AMBULATORY_CARE_PROVIDER_SITE_OTHER): Payer: 59 | Admitting: Family Medicine

## 2019-11-04 ENCOUNTER — Emergency Department (HOSPITAL_BASED_OUTPATIENT_CLINIC_OR_DEPARTMENT_OTHER): Payer: 59

## 2019-11-04 VITALS — BP 140/82 | HR 97 | Temp 98.6°F | Ht 60.0 in | Wt 170.0 lb

## 2019-11-04 DIAGNOSIS — R05 Cough: Secondary | ICD-10-CM | POA: Insufficient documentation

## 2019-11-04 DIAGNOSIS — U071 COVID-19: Secondary | ICD-10-CM

## 2019-11-04 DIAGNOSIS — R0789 Other chest pain: Secondary | ICD-10-CM | POA: Insufficient documentation

## 2019-11-04 DIAGNOSIS — R059 Cough, unspecified: Secondary | ICD-10-CM

## 2019-11-04 DIAGNOSIS — Z79899 Other long term (current) drug therapy: Secondary | ICD-10-CM | POA: Insufficient documentation

## 2019-11-04 DIAGNOSIS — J45909 Unspecified asthma, uncomplicated: Secondary | ICD-10-CM | POA: Diagnosis not present

## 2019-11-04 DIAGNOSIS — J111 Influenza due to unidentified influenza virus with other respiratory manifestations: Secondary | ICD-10-CM

## 2019-11-04 DIAGNOSIS — J101 Influenza due to other identified influenza virus with other respiratory manifestations: Secondary | ICD-10-CM

## 2019-11-04 DIAGNOSIS — Z8701 Personal history of pneumonia (recurrent): Secondary | ICD-10-CM

## 2019-11-04 DIAGNOSIS — R0902 Hypoxemia: Secondary | ICD-10-CM

## 2019-11-04 NOTE — ED Provider Notes (Signed)
[facility] DEPT [facility] Provider Note: Georgena Spurling, MD, FACEP  CSN: [id number] MRN: [id number] ARRIVAL: 11/04/19 at 2051 ROOM: [place]  Chest Pain (covid pos)   HISTORY OF PRESENT ILLNESS  11/04/19 11:28 PM [name] is a 48 y.o. female who was positive for Covid and influenza about a month ago.  She has subsequently tested negative for Covid.  She is here with chest pain and shortness of breath for a week.  The chest pain is precordial and feels like pressure.  She rates it as an 8 out of 10, it is worse with deep breathing or movement.  She describes it as feeling like she has pneumonia again (she has a history of frequent episodes of pneumonia in the past).  She has an inhaler at home and has been using it.   Past Medical History:  Diagnosis Date  . Arthritis   . Asthma   . Complication of anesthesia    respirations slow down after coming out from Anesthesia  . Endometriosis   . GERD (gastroesophageal reflux disease)     Past Surgical History:  Procedure Laterality Date  . ABDOMINAL HYSTERECTOMY    . CERVICAL DISC SURGERY    . CHOLECYSTECTOMY    . EUS N/A 03/14/2017   Procedure: UPPER ENDOSCOPIC ULTRASOUND (EUS) LINEAR;  Surgeon: Milus Banister, MD;  Location: [facility];  Service: Endoscopy;  Laterality: N/A;  . LAPAROSCOPIC ABDOMINAL EXPLORATION N/A 05/17/2017   Procedure: LAPAROSCOPIC POSSIBLE OPEN REMOVAL OF STOMACH MASS;  Surgeon: Jackolyn Confer, MD;  Location: [facility];  Service: General;  Laterality: N/A;  . LAPAROSCOPIC NEPHRECTOMY Right 05/17/2017   Procedure: RIGHT RADICAL NEPHRECTOMY, URETERECTOMY;  Surgeon: Ardis Hughs, MD;  Location: [facility];  Service: Urology;  Laterality: Right;  . tubal preganancy      Family History  Problem Relation Age of Onset  . Hypertension Mother   . Cancer Mother 64       "back cancer"  . Other Mother        hysterectomy at 1 for "pre-cancerous cervical cells"  . Hypertension  Father   . Colon cancer Father 61  . Cervical cancer Sister        full sister dx. unspecified age  . Colon polyps Sister        unspecified number  . Breast cancer Maternal Grandmother        unspecified age  . Diabetes Maternal Grandmother   . Diabetes Paternal Grandfather   . Colon cancer Paternal Aunt        (x2) paternal aunts with colon cancer at unspecified ages; lim info  . Colon cancer Paternal Uncle        dx. unspecified age; lim info  . Colon polyps Sister        paternal half-sister w/ colon polyps dx. age 34 or younger - unspecified number  . Colon cancer Sister 7       paternal half-sister   . Colon polyps Sister        paternal half-sister; unspecified number  . Colon cancer Sister        paternal half-sister dx. before age 78; has had two colon cancers  . Breast cancer Sister        paternal half-sister dx under age 56  . Renal cancer Sister 74       paternal half-sister dx. w/ "[facility] - lymphoid renal cancer"  . Cancer Sister 39  paternal half-sister dx cancer of wrist that was not a skin cancer  . Colon polyps Son 68       has had a "bleeding polyp"  . Cervical cancer Other 25       niece; +hysterectomy  . Breast cancer Paternal Aunt        (x3) paternal aunts with breast cancer at unspecified ages; lim info    Social History   Tobacco Use  . Smoking status: Never Smoker  . Smokeless tobacco: Never Used  Substance Use Topics  . Alcohol use: No  . Drug use: No    Prior to Admission medications   Medication Sig Start Date End Date Taking? Authorizing Provider  acetaminophen (TYLENOL) 500 MG tablet Take 1,000 mg by mouth every 6 (six) hours as needed (pain).    [provider]  albuterol (PROVENTIL HFA) 108 (90 Base) MCG/ACT inhaler Inhale 2 puffs into the lungs every 6 (six) hours as needed for wheezing or shortness of breath.    [provider]  APPLE CIDER VINEGAR PO Take 1 capsule by mouth daily.    [provider]   Biotin w/ Vitamins C & E (HAIR SKIN & NAILS GUMMIES PO) Take 2 tablets by mouth daily.     [provider]  Cholecalciferol (VITAMIN D PO) Take 2 tablets by mouth daily.    [provider]  Cyanocobalamin (VITAMIN B-12 IJ) Inject 1 each into the muscle every Thursday.    [provider]  Cyanocobalamin (VITAMIN B12 PO) Take 2 tablets by mouth daily.    [provider]  Multiple Vitamins-Minerals (MULTIVITAMIN GUMMIES ADULT) CHEW Chew 2 tablets by mouth daily.    [provider]  OVER THE COUNTER MEDICATION Take 4 tablets by mouth daily. Juice Plus     [provider]  pantoprazole (PROTONIX) 40 MG tablet Take 1 tablet (40 mg total) by mouth 2 (two) times daily. 04/09/19   Ladene Artist, MD  phentermine (ADIPEX-P) 37.5 MG tablet Take 37.5 mg by mouth daily before breakfast.    [provider]    Allergies Amoxicillin, Keflex [cephalexin], Penicillins, Baby powder [methylbenzethonium], Ibuprofen, and Doxycycline   REVIEW OF SYSTEMS  Negative except as noted here or in the History of Present Illness.   PHYSICAL EXAMINATION  Initial Vital Signs Blood pressure (!) 156/78, pulse 68, temperature 98.3 F (36.8 C), temperature source Oral, resp. rate 20, height 5' (1.524 m), weight 79.4 kg, SpO2 100 %.  Examination General: Well-developed, well-nourished female in no acute distress; appearance consistent with age of record HENT: normocephalic; atraumatic Eyes: pupils equal, round and reactive to light; extraocular muscles intact Neck: supple Heart: regular rate and rhythm Lungs: clear to auscultation bilaterally Abdomen: soft; nondistended; nontender; bowel sounds present Extremities: No deformity; full range of motion; pulses normal Neurologic: Awake, alert and oriented; motor function intact in all extremities and symmetric; no facial droop Skin: Warm and dry Psychiatric: Normal mood and affect   RESULTS  Summary of this  visit's results, reviewed and interpreted by myself:   EKG Interpretation  Date/Time:  Wednesday November 04 2019 21:03:38 EST Ventricular Rate:  81 PR Interval:  146 QRS Duration: 72 QT Interval:  374 QTC Calculation: 434 R Axis:   -23 Text Interpretation: Normal sinus rhythm with sinus arrhythmia Low voltage QRS Borderline ECG Confirmed by Braley Luckenbaugh [phone]) on 11/04/2019 11:39:49 PM      Laboratory Studies: Results for orders placed or performed during the hospital encounter of 11/04/19 (  from the past 24 hour(s))  D-dimer, quantitative (not at [facility])     Status: None   Collection Time: 11/05/19  1:15 AM  Result Value Ref Range   D-Dimer, Quant 0.38 0.00 - 0.50 ug/mL-FEU  CBC with Differential     Status: Abnormal   Collection Time: 11/05/19  1:15 AM  Result Value Ref Range   WBC 11.2 (H) 4.0 - 10.5 K/uL   RBC 4.96 3.87 - 5.11 MIL/uL   Hemoglobin 13.2 12.0 - 15.0 g/dL   HCT 42.2 36.0 - 46.0 %   MCV 85.1 80.0 - 100.0 fL   MCH 26.6 26.0 - 34.0 pg   MCHC 31.3 30.0 - 36.0 g/dL   RDW 14.6 11.5 - 15.5 %   Platelets 349 150 - 400 K/uL   nRBC 0.0 0.0 - 0.2 %   Neutrophils Relative % 62 %   Neutro Abs 6.8 1.7 - 7.7 K/uL   Lymphocytes Relative 27 %   Lymphs Abs 3.1 0.7 - 4.0 K/uL   Monocytes Relative 8 %   Monocytes Absolute 0.9 0.1 - 1.0 K/uL   Eosinophils Relative 3 %   Eosinophils Absolute 0.4 0.0 - 0.5 K/uL   Basophils Relative 0 %   Basophils Absolute 0.0 0.0 - 0.1 K/uL   Immature Granulocytes 0 %   Abs Immature Granulocytes 0.04 0.00 - 0.07 K/uL  Basic metabolic panel     Status: Abnormal   Collection Time: 11/05/19  1:15 AM  Result Value Ref Range   Sodium 135 135 - 145 mmol/L   Potassium 3.9 3.5 - 5.1 mmol/L   Chloride 106 98 - 111 mmol/L   CO2 22 22 - 32 mmol/L   Glucose, Bld 103 (H) 70 - 99 mg/dL   BUN 14 6 - 20 mg/dL   Creatinine, Ser 0.76 0.44 - 1.00 mg/dL   Calcium 9.0 8.9 - 10.3 mg/dL   GFR calc non Af Amer >60 >60 mL/min   GFR calc Af Amer >60 >60  mL/min   Anion gap 7 5 - 15   Imaging Studies: DG Chest Port 1 View  Result Date: 11/05/2019 CLINICAL DATA:  COVID positive EXAM: PORTABLE CHEST 1 VIEW COMPARISON:  12/25/2016 FINDINGS: The heart size and mediastinal contours are within normal limits. Both lungs are clear. Partially visualized hardware in the lower cervical spine. IMPRESSION: No active disease. Electronically Signed   By: Donavan Foil M.D.   On: 11/05/2019 01:08    ED COURSE and MDM  Nursing notes, initial and subsequent vitals signs, including pulse oximetry, reviewed and interpreted by myself.  Vitals:   11/04/19 2105  BP: (!) 156/78  Pulse: 68  Resp: 20  Temp: 98.3 F (36.8 C)  TempSrc: Oral  SpO2: 100%  Weight: 79.4 kg  Height: 5' (1.524 m)   Medications  chlorpheniramine-HYDROcodone (TUSSIONEX) 10-8 MG/5ML suspension 5 mL (has no administration in time range)    Work-up is unremarkable in the ED.  Her symptoms likely represent late manifestations of Covid.  PROCEDURES  Procedures   ED DIAGNOSES     ICD-10-CM   1. Chest wall pain  R07.89   2. Cough  R05 DG Chest [facility] [place] [address]       [place], [place], [place] 11/05/19 [phone]

## 2019-11-04 NOTE — Telephone Encounter (Signed)
Spoke to [facility] and informed the charge nurse that patient is on the way. Informed that patient was covid positive and flu positive and is still having some chest discomfort.

## 2019-11-04 NOTE — ED Triage Notes (Addendum)
C/o CP and SOB x 1 week-states she was +covid 3-4 weeks ago and has since tested neg-states she was "sent from the pulmonologist" just PTA-NAD-steady gait

## 2019-11-04 NOTE — Telephone Encounter (Signed)
I called the pt for more information as we do not have access to records from [facility].  Patient stated she needs a refill on a Z-pack and vitamin D3 as she had COVID 3.5 weeks ago.  I advised the patient refills are not given on a Z-pack without an appt and offered a virtual appt for today.  Appt scheduled for today.

## 2019-11-04 NOTE — Progress Notes (Signed)
Virtual Visit via Video Note  I connected with [name] on 11/04/19 at  2:30 PM EST by a video enabled telemedicine application 2/2 [id number] pandemic and verified that I am speaking with the correct person using two identifiers.  Location patient: home Location provider:work or home office Persons participating in the virtual visit: patient, provider  I discussed the limitations of evaluation and management by telemedicine and the availability of in person appointments. The patient expressed understanding and agreed to proceed.   HPI: Pt is a 48 yo female with pmh sig for arthritis, asthma, GERD previously seen by Dr. Burnice Logan.  Pt has yet to est care with a new provider and is being seen for acute illness.  Pt tested positive for COVID-19 and flu at [facility] 12/15.  Her husband tested negative despite having symptoms.  Pt with dry cough, diarrhea, sore throat, HA, no taste or smell, but fever has resolved.  Pt taking using inhaler.  Mucinex, delsym, and nyquil make pt nauseated.  Pt completed a Zpak 2 wks ago.  Pt states she needs another z-pak so she does not get pneumonia.  Pt states she knows her body and needs another z-pak.   ROS: See pertinent positives and negatives per HPI.  Past Medical History:  Diagnosis Date  . Arthritis   . Asthma   . Complication of anesthesia    respirations slow down after coming out from Anesthesia  . Endometriosis   . GERD (gastroesophageal reflux disease)     Past Surgical History:  Procedure Laterality Date  . ABDOMINAL HYSTERECTOMY    . CERVICAL DISC SURGERY    . CHOLECYSTECTOMY    . EUS N/A 03/14/2017   Procedure: UPPER ENDOSCOPIC ULTRASOUND (EUS) LINEAR;  Surgeon: Milus Banister, MD;  Location: [facility];  Service: Endoscopy;  Laterality: N/A;  . LAPAROSCOPIC ABDOMINAL EXPLORATION N/A 05/17/2017   Procedure: LAPAROSCOPIC POSSIBLE OPEN REMOVAL OF STOMACH MASS;  Surgeon: Jackolyn Confer, MD;  Location: [facility] ORS;  Service: General;   Laterality: N/A;  . LAPAROSCOPIC NEPHRECTOMY Right 05/17/2017   Procedure: RIGHT RADICAL NEPHRECTOMY, URETERECTOMY;  Surgeon: Ardis Hughs, MD;  Location: [facility] ORS;  Service: Urology;  Laterality: Right;  . tubal preganancy      Family History  Problem Relation Age of Onset  . Hypertension Mother   . Cancer Mother 90       "back cancer"  . Other Mother        hysterectomy at 59 for "pre-cancerous cervical cells"  . Hypertension Father   . Colon cancer Father 54  . Cervical cancer Sister        full sister dx. unspecified age  . Colon polyps Sister        unspecified number  . Breast cancer Maternal Grandmother        unspecified age  . Diabetes Maternal Grandmother   . Diabetes Paternal Grandfather   . Colon cancer Paternal Aunt        (x2) paternal aunts with colon cancer at unspecified ages; lim info  . Colon cancer Paternal Uncle        dx. unspecified age; lim info  . Colon polyps Sister        paternal half-sister w/ colon polyps dx. age 61 or younger - unspecified number  . Colon cancer Sister 7       paternal half-sister   . Colon polyps Sister        paternal half-sister; unspecified number  . Colon cancer Sister  paternal half-sister dx. before age 68; has had two colon cancers  . Breast cancer Sister        paternal half-sister dx under age 41  . Renal cancer Sister 107       paternal half-sister dx. w/ "[facility] - lymphoid renal cancer"  . Cancer Sister 66       paternal half-sister dx cancer of wrist that was not a skin cancer  . Colon polyps Son 29       has had a "bleeding polyp"  . Cervical cancer Other 25       niece; +hysterectomy  . Breast cancer Paternal Aunt        (x3) paternal aunts with breast cancer at unspecified ages; lim info    Current Outpatient Medications:  .  acetaminophen (TYLENOL) 500 MG tablet, Take 1,000 mg by mouth every 6 (six) hours as needed (pain)., Disp: , Rfl:  .  albuterol (PROVENTIL HFA) 108 (90 Base) MCG/ACT inhaler,  Inhale 2 puffs into the lungs every 6 (six) hours as needed for wheezing or shortness of breath., Disp: , Rfl:  .  APPLE CIDER VINEGAR PO, Take 1 capsule by mouth daily., Disp: , Rfl:  .  Biotin w/ Vitamins C & E (HAIR SKIN & NAILS GUMMIES PO), Take 2 tablets by mouth daily. , Disp: , Rfl:  .  Cholecalciferol (VITAMIN D PO), Take 2 tablets by mouth daily., Disp: , Rfl:  .  Cyanocobalamin (VITAMIN B-12 IJ), Inject 1 each into the muscle every Thursday., Disp: , Rfl:  .  Cyanocobalamin (VITAMIN B12 PO), Take 2 tablets by mouth daily., Disp: , Rfl:  .  Multiple Vitamins-Minerals (MULTIVITAMIN GUMMIES ADULT) CHEW, Chew 2 tablets by mouth daily., Disp: , Rfl:  .  OVER THE COUNTER MEDICATION, Take 4 tablets by mouth daily. Juice Plus , Disp: , Rfl:  .  pantoprazole (PROTONIX) 40 MG tablet, Take 1 tablet (40 mg total) by mouth 2 (two) times daily., Disp: 180 tablet, Rfl: 3 .  phentermine (ADIPEX-P) 37.5 MG tablet, Take 37.5 mg by mouth daily before breakfast., Disp: , Rfl:   Current Facility-Administered Medications:  .  ipratropium-albuterol (DUONEB) 0.5-2.5 (3) MG/3ML nebulizer solution 3 mL, 3 mL, Nebulization, Q6H, Nafziger, Tommi Rumps, NP  EXAM:  VITALS per patient if applicable:  RR between 12-20 bpm  GENERAL: alert, oriented, appears well and in no acute distress  HEENT: atraumatic, conjunctiva clear, no obvious abnormalities on inspection of external nose and ears  NECK: normal movements of the head and neck  LUNGS: occassional dry cough.  on inspection no signs of respiratory distress, breathing rate appears normal, no obvious gross SOB, gasping or wheezing  CV: no obvious cyanosis  MS: moves all visible extremities without noticeable abnormality  PSYCH/NEURO: pleasant and cooperative, no obvious depression or anxiety, speech and thought processing grossly intact  ASSESSMENT AND PLAN:  Discussed the following assessment and plan:  COVID-19 virus infection -tested positive 12/15 at  [facility].   -completed a Z-pak -discussed continuing supportive care.   -Albuterol inhaler prn -multiple allergies reviewed. -pt adamant she needs another Z-pak.  Advised of need for lung exam and possible CXR.  Pt declines.  Refuses UC or ED.  Offered appt at respiratory clinic at 7 pm. -given precautions  Influenza -likely resolved as tested positive 12/15 at [facility]  This provider is not accepting new pts of TOC.  Pt to f/u prn with new pcp.  I discussed the assessment and treatment plan with the patient. The  patient was provided an opportunity to ask questions and all were answered. The patient agreed with the plan and demonstrated an understanding of the instructions.   The patient was advised to call back or seek an in-person evaluation if the symptoms worsen or if the condition fails to improve as anticipated.  Billie Ruddy, MD   This note is not being shared with the patient for the following reason: To prevent harm (release of this note would result in harm to the life or physical safety of the patient or another).

## 2019-11-04 NOTE — ED Notes (Signed)
Pt states she is going to her car and requests to be called when exam room available-NAD-steady gait

## 2019-11-04 NOTE — ED Triage Notes (Signed)
Pt c/o chest pain x   , covid positive x 3 weeks ago

## 2019-11-05 LAB — CBC WITH DIFFERENTIAL/PLATELET
Abs Immature Granulocytes: 0.04 10*3/uL (ref 0.00–0.07)
Basophils Absolute: 0 10*3/uL (ref 0.0–0.1)
Basophils Relative: 0 %
Eosinophils Absolute: 0.4 10*3/uL (ref 0.0–0.5)
Eosinophils Relative: 3 %
HCT: 42.2 % (ref 36.0–46.0)
Hemoglobin: 13.2 g/dL (ref 12.0–15.0)
Immature Granulocytes: 0 %
Lymphocytes Relative: 27 %
Lymphs Abs: 3.1 10*3/uL (ref 0.7–4.0)
MCH: 26.6 pg (ref 26.0–34.0)
MCHC: 31.3 g/dL (ref 30.0–36.0)
MCV: 85.1 fL (ref 80.0–100.0)
Monocytes Absolute: 0.9 10*3/uL (ref 0.1–1.0)
Monocytes Relative: 8 %
Neutro Abs: 6.8 10*3/uL (ref 1.7–7.7)
Neutrophils Relative %: 62 %
Platelets: 349 10*3/uL (ref 150–400)
RBC: 4.96 MIL/uL (ref 3.87–5.11)
RDW: 14.6 % (ref 11.5–15.5)
WBC: 11.2 10*3/uL — ABNORMAL HIGH (ref 4.0–10.5)
nRBC: 0 % (ref 0.0–0.2)

## 2019-11-05 LAB — BASIC METABOLIC PANEL
Anion gap: 7 (ref 5–15)
BUN: 14 mg/dL (ref 6–20)
CO2: 22 mmol/L (ref 22–32)
Calcium: 9 mg/dL (ref 8.9–10.3)
Chloride: 106 mmol/L (ref 98–111)
Creatinine, Ser: 0.76 mg/dL (ref 0.44–1.00)
GFR calc Af Amer: >60 mL/min (ref >60)
GFR calc non Af Amer: >60 mL/min (ref >60)
Glucose, Bld: 103 mg/dL — ABNORMAL HIGH (ref 70–99)
Potassium: 3.9 mmol/L (ref 3.5–5.1)
Sodium: 135 mmol/L (ref 135–145)

## 2019-11-05 LAB — D-DIMER, QUANTITATIVE: D-Dimer, Quant: 0.38 ug/mL-FEU (ref 0.00–0.50)

## 2019-11-05 MED ORDER — HYDROCOD POLST-CPM POLST ER 10-8 MG/5ML PO SUER
5.0000 mL | Freq: Once | ORAL | Status: AC
  Administered 2019-11-05: 5 mL via ORAL
  Filled 2019-11-05: qty 5

## 2019-11-05 MED ORDER — HYDROCOD POLST-CPM POLST ER 10-8 MG/5ML PO SUER: 5.0000 mL | Freq: Two times a day (BID) | ORAL | 0 refills | Status: DC | PRN

## 2019-11-05 NOTE — Progress Notes (Signed)
Respiratory Clinic Note   Patient: [name] Female    DOB: July 29, 1971   48 y.o.   MRN: [id number] Visit Date: 11/04/2019  Today's Provider: [place]   CC: Follow up of persistent symptoms in the context of +Covid 19 PCR and influenza and new onset chest pain   Subjective:    Covid-19 Nucleic Acid Test Results No results found for: SARSCOV2NAA, SARSCOV2  HPI PCR positive 3&1/2 weeks ago at [facility] urgent care.  Also influenza swab was positive.  Patient was prescribed an inhaler, Z-Pak, vitamin D3 and oral steroids. She continues to have fatigue ,and for the last week anterior chest pressure.  She states that she has had pneumonia 3-4 times with a similar presenting picture in the context of a history of asthma.  She is dyspneic if she is supine.  She also describes persistent dizziness, diarrhea, and cough with clear phlegm.  The fatigue is associated with profound weakness.  She contacted the care provider who is taking over for her PCP who had retired via a virtual visit and was told to come to this clinic. 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  She feared she would have to wait hours for an evaluation. She is concerned she again has pneumonia.  Problematic is multiple drug allergies and and intolerances. 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  She has also had hysterectomy for fibroids and a mass of the ureter resected.  Current Outpatient Medications:  .  acetaminophen (TYLENOL) 500 MG tablet, Take 1,000 mg by mouth every 6 (six) hours as needed (pain)., Disp: , Rfl:  .  albuterol (PROVENTIL HFA) 108 (90 Base) MCG/ACT inhaler, Inhale 2 puffs into the lungs every 6 (six) hours as needed for wheezing or  shortness of breath., Disp: , Rfl:  .  APPLE CIDER VINEGAR PO, Take 1 capsule by mouth daily., Disp: , Rfl:  .  Biotin w/ Vitamins C & E (HAIR SKIN & NAILS GUMMIES PO), Take 2 tablets by mouth daily. , Disp: , Rfl:  .  Cholecalciferol (VITAMIN D PO), Take 2 tablets by mouth daily., Disp: , Rfl:  .  Cyanocobalamin (VITAMIN B-12 IJ), Inject 1 each into the muscle every Thursday., Disp: , Rfl:  .  Cyanocobalamin (VITAMIN B12 PO), Take 2 tablets by mouth daily., Disp: , Rfl:  .  Multiple Vitamins-Minerals (MULTIVITAMIN GUMMIES ADULT) CHEW, Chew 2 tablets by mouth daily., Disp: , Rfl:  .  OVER THE COUNTER MEDICATION, Take 4 tablets by mouth daily. Juice Plus , Disp: , Rfl:  .  pantoprazole (PROTONIX) 40 MG tablet, Take 1 tablet (40 mg total) by mouth 2 (two) times daily., Disp: 180 tablet, Rfl: 3 .  phentermine (ADIPEX-P) 37.5 MG tablet, Take 37.5 mg by mouth daily before breakfast., Disp: , Rfl:  .  chlorpheniramine-HYDROcodone (TUSSIONEX PENNKINETIC ER) 10-8 MG/5ML SUER, Take 5 mLs by mouth every 12 (twelve) hours as needed., Disp: 70 mL, Rfl: 0  Current Facility-Administered Medications:  .  ipratropium-albuterol (DUONEB) 0.5-2.5 (3) MG/3ML nebulizer solution 3 mL, 3 mL, Nebulization, Q6H, Nafziger, Tommi Rumps, NP  Allergies  Allergen Reactions  . Amoxicillin Anaphylaxis    All "cillins" pt was 35-26 years old  Has patient had a PCN reaction causing immediate  rash, facial/tongue/throat swelling, SOB or lightheadedness with hypotension: yes Has patient had a PCN reaction causing severe rash involving mucus membranes or skin necrosis: unknown Has patient had a PCN reaction that required hospitalization yes Has patient had a PCN reaction occurring within the last 10 years:no If all of the above answers are "NO", then may proceed with Cephalosporin use.   Marland Kitchen Keflex [Cephalexin] Anaphylaxis  . Penicillins Anaphylaxis    Has patient had a PCN reaction causing immediate rash, facial/tongue/throat swelling,  SOB or lightheadedness with hypotension:yes Has patient had a PCN reaction causing severe rash involving mucus membranes or skin necrosis: unknown Has patient had a PCN reaction that required hospitalization yes  Has patient had a PCN reaction occurring within the last 10 years: no If all of the above answers are "NO", then may proceed with Cephalosporin use.   . Baby Powder [Methylbenzethonium] Hives  . Ibuprofen Hives  . Doxycycline Diarrhea and Rash    Review of Systems   Constitutional: No significant fever  Eyes: No redness, discharge, pain, vision change ENT/mouth: No nasal  purulent discharge, earache, change in hearing  Cardiovascular: No  palpitations Respiratory: No hemoptysis Gastrointestinal: No heartburn, dysphagia, abdominal pain, nausea /vomiting, rectal bleeding, melena Genitourinary: No dysuria, hematuria, pyuria Dermatologic: No rash, pruritus, change in appearance of skin Neurologic: No  syncope, seizures, numbness, tingling Hematologic/lymphatic: No significant bruising, lymphadenopathy, abnormal bleeding     Objective:   BP 140/82   Pulse 97   Temp 98.6 F (37 C)   Ht 5' (1.524 m)   Wt 170 lb (77.1 kg)   SpO2 90%   BMI 33.20 kg/m   Physical Exam  Pertinent or positive findings: She expresses frustration & disappointment in her evaluation to date.  Despite the worsening symptoms; physical exam was unrevealing.  Obviously this does not rule out a major cardiac event, especially with O2 sats of 90%.  General appearance: Adequately nourished; no acute distress, increased work of breathing is present.   Lymphatic: No lymphadenopathy about the head, neck, axilla. Eyes: No conjunctival inflammation or lid edema is present. There is no scleral icterus. Ears:  External ear exam shows no significant lesions or deformities.   Nose:  External nasal examination shows no deformity or inflammation. Nasal mucosa are pink and moist without lesions, exudates Oral exam:   Lips and gums are healthy appearing. There is no oropharyngeal erythema or exudate. Neck:  No thyromegaly, masses, tenderness noted.    Heart:  Normal rate and regular rhythm. S1 and S2 normal without gallop, murmur, click, rub .  Lungs: Chest clear to auscultation without wheezes, rhonchi, rales, rubs. Abdomen: Bowel sounds are normal. Abdomen is soft and nontender with no organomegaly, hernias, masses. GU: Deferred  Extremities:  No cyanosis, clubbing, edema  Skin: Warm & dry w/o tenting. No significant lesions or rash.     Assessment & Plan    #1 hx of + Covid-19 PCR and influenza screen approximately 3 & 1/2 weeks ago at an urgent care.  Treated with Z-Pak, vit D3, oral steroids and a bronchodilator. #2 atypical chest pain #3 history of recurrent pneumonia in context of asthma #4 mild hypoxemia #5 multiple antibiotic allergies and intolerances  Plan: I explained to the patient that imaging was indicated to rule out pneumonia but my main concern was atypical presentation of anginal disease or embolic phenomena related to Covid-19 infection.  I expressed it was in her best interest to go to the ED to rule out a cardiac  event and active pneumonia.    [facility]

## 2019-11-05 NOTE — Patient Instructions (Addendum)
Unfortunately an ED visit is medically indicated to rule out Covid-19 related pneumonia or a cardiac etiology of the chest pain.

## 2020-04-28 DIAGNOSIS — R42 Dizziness and giddiness: Secondary | ICD-10-CM | POA: Insufficient documentation

## 2020-04-29 ENCOUNTER — Other Ambulatory Visit: Payer: Self-pay | Admitting: Gastroenterology

## 2020-04-29 NOTE — Telephone Encounter (Signed)
Patient scheduled appt for 06/20/20

## 2020-04-29 NOTE — Telephone Encounter (Signed)
[name],   1 month supply of Pantoprazole was sent to pharmacy. Pt needs to schedule office follow-up with Dr. Fuller Plan to receive further refills.

## 2020-04-29 NOTE — Telephone Encounter (Signed)
Patient called is requesting refill on Protonix

## 2020-05-04 NOTE — Telephone Encounter (Signed)
Pt called to inform that prescription for pantoprazole was sent to 1 pil a day but she takes 1 pill BID. She wants updated prescription sent to her pharmacy.

## 2020-05-04 NOTE — Telephone Encounter (Signed)
Informed patient we sent in the prescription for pantoprazole on 04/29/20 for twice daily #60. Informed patient she may want to contact her pharmacy and find out why they only gave her a 30 day supply. Patient verbalized understanding.

## 2020-06-03 ENCOUNTER — Other Ambulatory Visit: Payer: Self-pay | Admitting: Gastroenterology

## 2020-06-06 MED ORDER — PANTOPRAZOLE SODIUM 40 MG PO TBEC: 40.0000 mg | DELAYED_RELEASE_TABLET | Freq: Two times a day (BID) | ORAL | 0 refills | Status: DC

## 2020-06-06 NOTE — Telephone Encounter (Signed)
Patient states the pharmacy only has 30 day supply please call patient to advise

## 2020-06-06 NOTE — Telephone Encounter (Signed)
Sent in enough pantoprazole to cover her, she has upcoming appointment and ran out Friday and is hurting without her medicine.

## 2020-06-06 NOTE — Addendum Note (Signed)
Addended by: [place], Chloe Bluett E on: 06/06/2020 11:21 AM   Modules accepted: Orders

## 2020-06-20 ENCOUNTER — Encounter: Payer: Self-pay | Admitting: Gastroenterology

## 2020-06-20 ENCOUNTER — Ambulatory Visit (INDEPENDENT_AMBULATORY_CARE_PROVIDER_SITE_OTHER): Payer: 59 | Admitting: Gastroenterology

## 2020-06-20 VITALS — BP 108/70 | HR 108 | Ht 60.0 in | Wt 184.0 lb

## 2020-06-20 DIAGNOSIS — K219 Gastro-esophageal reflux disease without esophagitis: Secondary | ICD-10-CM | POA: Diagnosis not present

## 2020-06-20 DIAGNOSIS — Z800 Family history of malignant neoplasm of digestive organs: Secondary | ICD-10-CM

## 2020-06-20 MED ORDER — PANTOPRAZOLE SODIUM 40 MG PO TBEC: 40.0000 mg | DELAYED_RELEASE_TABLET | Freq: Two times a day (BID) | ORAL | 11 refills | Status: DC

## 2020-06-20 NOTE — Patient Instructions (Signed)
We have sent the following medications to your pharmacy for you to pick up at your convenience: pantoprazole.   We put a recall colonoscopy in 08/2020. We will send you a reminder in the mail when it gets closer to that time.  Thank you for choosing me and [facility].  Pricilla Riffle. Dagoberto Ligas., MD., Marval Regal

## 2020-06-20 NOTE — Progress Notes (Signed)
    History of Present Illness: This is a 49 year old female returning for follow-up of GERD and a strong family history of colon cancer.  She relates her reflux symptoms are well controlled on pantoprazole 40 mg twice daily however when she is off medication her symptoms are severe.  She attempted to control them with Nexium OTC without success when her prescription ran out.  No other gastrointestinal complaints.  Current Medications, Allergies, Past Medical History, Past Surgical History, Family History and Social History were reviewed in [employer] record.   Physical Exam: General: Well developed, well nourished, no acute distress Head: Normocephalic and atraumatic Eyes:  sclerae anicteric, EOMI Ears: Normal auditory acuity Mouth: Not examined, mask on during Covid-19 pandemic Lungs: Clear throughout to auscultation Heart: Regular rate and rhythm; no murmurs, rubs or bruits Abdomen: Soft, non tender and non distended. No masses, hepatosplenomegaly or hernias noted. Normal Bowel sounds Rectal: Not done Musculoskeletal: Symmetrical with no gross deformities  Pulses:  Normal pulses noted Extremities: No clubbing, cyanosis, edema or deformities noted Neurological: Alert oriented x 4, grossly nonfocal Psychological:  Alert and cooperative. Normal mood and affect   Assessment and Recommendations:  1. GERD.  Resume pantoprazole 40 mg p.o. twice daily for long-term management.  Follow antireflux measures. REV in 1 year.  2.  Strong family history of colon cancer.  Genetic testing performed in 2017 without abnormal genes identified however given her strong family history with family members at an early age with colon cancer we will continue short interval screening.  She is overdue for her 1 year interval colonoscopy and states she will schedule this fall or winter.

## 2020-08-17 ENCOUNTER — Other Ambulatory Visit: Payer: Self-pay | Admitting: Gastroenterology

## 2020-08-18 ENCOUNTER — Telehealth: Payer: Self-pay | Admitting: Gastroenterology

## 2020-08-18 NOTE — Telephone Encounter (Signed)
Prescription sent again to [facility]. Patient notified. Prescription was sent in August with 11 refills at her appt with Dr. Fuller Plan.

## 2020-08-18 NOTE — Telephone Encounter (Signed)
Pt states she needs her [facility] states they do not have medication.

## 2020-11-05 HISTORY — PX: COLONOSCOPY: SHX174

## 2020-12-16 ENCOUNTER — Encounter: Payer: Self-pay | Admitting: Gastroenterology

## 2021-05-26 ENCOUNTER — Encounter (HOSPITAL_COMMUNITY)
Admission: EM | Disposition: A | Discharge status: Home / Self Care | Payer: Self-pay | Source: Home / Self Care | Attending: Emergency Medicine

## 2021-05-26 ENCOUNTER — Other Ambulatory Visit: Payer: Self-pay

## 2021-05-26 ENCOUNTER — Observation Stay (HOSPITAL_COMMUNITY)
Admission: EM | Admit: 2021-05-26 | Discharge: 2021-05-27 | Disposition: A | Discharge status: Home / Self Care | Payer: 59 | Attending: Orthopedic Surgery | Admitting: Orthopedic Surgery

## 2021-05-26 ENCOUNTER — Emergency Department (HOSPITAL_COMMUNITY): Payer: 59 | Admitting: Anesthesiology

## 2021-05-26 ENCOUNTER — Encounter (HOSPITAL_COMMUNITY): Payer: Self-pay | Admitting: Pharmacy Technician

## 2021-05-26 ENCOUNTER — Emergency Department (HOSPITAL_COMMUNITY): Payer: 59

## 2021-05-26 DIAGNOSIS — S81012A Laceration without foreign body, left knee, initial encounter: Secondary | ICD-10-CM | POA: Diagnosis not present

## 2021-05-26 DIAGNOSIS — W312XXA Contact with powered woodworking and forming machines, initial encounter: Secondary | ICD-10-CM | POA: Insufficient documentation

## 2021-05-26 DIAGNOSIS — Z20822 Contact with and (suspected) exposure to covid-19: Secondary | ICD-10-CM | POA: Insufficient documentation

## 2021-05-26 DIAGNOSIS — Y9389 Activity, other specified: Secondary | ICD-10-CM | POA: Diagnosis not present

## 2021-05-26 DIAGNOSIS — J45909 Unspecified asthma, uncomplicated: Secondary | ICD-10-CM | POA: Diagnosis not present

## 2021-05-26 DIAGNOSIS — J069 Acute upper respiratory infection, unspecified: Secondary | ICD-10-CM

## 2021-05-26 DIAGNOSIS — S81002A Unspecified open wound, left knee, initial encounter: Secondary | ICD-10-CM | POA: Diagnosis not present

## 2021-05-26 DIAGNOSIS — Z9889 Other specified postprocedural states: Secondary | ICD-10-CM

## 2021-05-26 DIAGNOSIS — S82002B Unspecified fracture of left patella, initial encounter for open fracture type I or II: Secondary | ICD-10-CM | POA: Diagnosis not present

## 2021-05-26 DIAGNOSIS — S82092B Other fracture of left patella, initial encounter for open fracture type I or II: Secondary | ICD-10-CM

## 2021-05-26 DIAGNOSIS — S8992XA Unspecified injury of left lower leg, initial encounter: Secondary | ICD-10-CM | POA: Diagnosis present

## 2021-05-26 HISTORY — PX: KNEE ARTHROSCOPY: SHX127

## 2021-05-26 HISTORY — PX: I & D EXTREMITY: SHX5045

## 2021-05-26 LAB — RESP PANEL BY RT-PCR (FLU A&B, COVID) ARPGX2
Influenza A by PCR: NEGATIVE
Influenza B by PCR: NEGATIVE
SARS Coronavirus 2 by RT PCR: NEGATIVE

## 2021-05-26 SURGERY — ARTHROSCOPY, KNEE
Anesthesia: General | Site: Knee | Laterality: Left

## 2021-05-26 MED ORDER — LACTATED RINGERS IV SOLN: INTRAVENOUS | Status: DC | PRN

## 2021-05-26 MED ORDER — VANCOMYCIN HCL 500 MG IV SOLR
INTRAVENOUS | Status: AC
  Filled 2021-05-26: qty 500

## 2021-05-26 MED ORDER — HYDROMORPHONE HCL 1 MG/ML IJ SOLN
0.5000 mg | Freq: Once | INTRAMUSCULAR | Status: AC
  Administered 2021-05-26: 0.5 mg via INTRAVENOUS
  Filled 2021-05-26: qty 1

## 2021-05-26 MED ORDER — MORPHINE SULFATE 4 MG/ML IJ SOLN
INTRAMUSCULAR | Status: DC | PRN
  Administered 2021-05-26: 8 mg via SUBCUTANEOUS

## 2021-05-26 MED ORDER — MIDAZOLAM HCL 2 MG/2ML IJ SOLN
INTRAMUSCULAR | Status: AC
  Filled 2021-05-26: qty 2

## 2021-05-26 MED ORDER — ONDANSETRON HCL 4 MG/2ML IJ SOLN
4.0000 mg | Freq: Once | INTRAMUSCULAR | Status: AC
  Administered 2021-05-26: 4 mg via INTRAVENOUS
  Filled 2021-05-26: qty 2

## 2021-05-26 MED ORDER — MIDAZOLAM HCL 5 MG/5ML IJ SOLN
INTRAMUSCULAR | Status: DC | PRN
  Administered 2021-05-26: 2 mg via INTRAVENOUS

## 2021-05-26 MED ORDER — BUPIVACAINE HCL (PF) 0.25 % IJ SOLN
INTRAMUSCULAR | Status: DC | PRN
  Administered 2021-05-26: 30 mL

## 2021-05-26 MED ORDER — MORPHINE SULFATE (PF) 4 MG/ML IV SOLN
INTRAVENOUS | Status: AC
  Filled 2021-05-26: qty 1

## 2021-05-26 MED ORDER — IPRATROPIUM-ALBUTEROL 0.5-2.5 (3) MG/3ML IN SOLN
3.0000 mL | Freq: Four times a day (QID) | RESPIRATORY_TRACT | Status: DC
  Administered 2021-05-27: 3 mL via RESPIRATORY_TRACT
  Filled 2021-05-26: qty 3

## 2021-05-26 MED ORDER — LEVOFLOXACIN IN D5W 750 MG/150ML IV SOLN
750.0000 mg | Freq: Once | INTRAVENOUS | Status: AC
  Administered 2021-05-27: 750 mg via INTRAVENOUS
  Filled 2021-05-26 (×2): qty 150

## 2021-05-26 MED ORDER — CLONIDINE HCL (ANALGESIA) 100 MCG/ML EP SOLN
EPIDURAL | Status: DC | PRN
  Administered 2021-05-26: 1 mL

## 2021-05-26 MED ORDER — ALBUTEROL SULFATE (2.5 MG/3ML) 0.083% IN NEBU: 3.0000 mL | INHALATION_SOLUTION | Freq: Four times a day (QID) | RESPIRATORY_TRACT | Status: DC | PRN

## 2021-05-26 MED ORDER — CLONIDINE HCL (ANALGESIA) 100 MCG/ML EP SOLN
EPIDURAL | Status: AC
  Filled 2021-05-26: qty 10

## 2021-05-26 MED ORDER — PROPOFOL 500 MG/50ML IV EMUL
INTRAVENOUS | Status: DC | PRN
  Administered 2021-05-26: 25 ug/kg/min via INTRAVENOUS

## 2021-05-26 MED ORDER — VANCOMYCIN HCL 500 MG IV SOLR
INTRAVENOUS | Status: DC | PRN
  Administered 2021-05-26: 500 mg via TOPICAL

## 2021-05-26 MED ORDER — METOCLOPRAMIDE HCL 5 MG/ML IJ SOLN
10.0000 mg | Freq: Once | INTRAMUSCULAR | Status: AC
  Administered 2021-05-26: 10 mg via INTRAVENOUS
  Filled 2021-05-26: qty 2

## 2021-05-26 MED ORDER — LIDOCAINE HCL (CARDIAC) PF 100 MG/5ML IV SOSY
PREFILLED_SYRINGE | INTRAVENOUS | Status: DC | PRN
  Administered 2021-05-26: 60 mg via INTRATRACHEAL

## 2021-05-26 MED ORDER — FENTANYL CITRATE (PF) 250 MCG/5ML IJ SOLN
INTRAMUSCULAR | Status: AC
  Filled 2021-05-26: qty 5

## 2021-05-26 MED ORDER — PROPOFOL 10 MG/ML IV BOLUS
INTRAVENOUS | Status: DC | PRN
  Administered 2021-05-26: 150 mg via INTRAVENOUS

## 2021-05-26 MED ORDER — SODIUM CHLORIDE 0.9 % IR SOLN
Status: DC | PRN
  Administered 2021-05-26: 3000 mL
  Administered 2021-05-26: 2000 mL

## 2021-05-26 MED ORDER — FENTANYL CITRATE (PF) 250 MCG/5ML IJ SOLN
INTRAMUSCULAR | Status: DC | PRN
  Administered 2021-05-26 (×3): 100 ug via INTRAVENOUS
  Administered 2021-05-26: 50 ug via INTRAVENOUS

## 2021-05-26 MED ORDER — LORAZEPAM 2 MG/ML IJ SOLN
1.0000 mg | Freq: Once | INTRAMUSCULAR | Status: AC
  Administered 2021-05-26: 1 mg via INTRAVENOUS
  Filled 2021-05-26: qty 1

## 2021-05-26 MED ORDER — BUPIVACAINE-EPINEPHRINE (PF) 0.25% -1:200000 IJ SOLN
INTRAMUSCULAR | Status: AC
  Filled 2021-05-26: qty 30

## 2021-05-26 MED ORDER — LEVOFLOXACIN IN D5W 750 MG/150ML IV SOLN
750.0000 mg | Freq: Once | INTRAVENOUS | Status: AC
  Administered 2021-05-26: 750 mg via INTRAVENOUS
  Filled 2021-05-26: qty 150

## 2021-05-26 MED ORDER — SUCCINYLCHOLINE CHLORIDE 20 MG/ML IJ SOLN
INTRAMUSCULAR | Status: DC | PRN
  Administered 2021-05-26: 80 mg via INTRAVENOUS

## 2021-05-26 MED ORDER — PROMETHAZINE HCL 25 MG/ML IJ SOLN
INTRAMUSCULAR | Status: AC
  Filled 2021-05-26: qty 1

## 2021-05-26 MED ORDER — LIDOCAINE-EPINEPHRINE (PF) 2 %-1:200000 IJ SOLN
10.0000 mL | Freq: Once | INTRAMUSCULAR | Status: DC
  Filled 2021-05-26: qty 20

## 2021-05-26 MED ORDER — PROPOFOL 10 MG/ML IV BOLUS
INTRAVENOUS | Status: AC
  Filled 2021-05-26: qty 40

## 2021-05-26 MED ORDER — BUPIVACAINE HCL (PF) 0.25 % IJ SOLN
INTRAMUSCULAR | Status: AC
  Filled 2021-05-26: qty 30

## 2021-05-26 MED ORDER — PANTOPRAZOLE SODIUM 40 MG PO TBEC
40.0000 mg | DELAYED_RELEASE_TABLET | Freq: Two times a day (BID) | ORAL | Status: DC
  Administered 2021-05-27: 40 mg via ORAL
  Filled 2021-05-26: qty 1

## 2021-05-26 MED ORDER — EPINEPHRINE PF 1 MG/ML IJ SOLN
INTRAMUSCULAR | Status: AC
  Filled 2021-05-26: qty 2

## 2021-05-26 MED ORDER — PROMETHAZINE HCL 25 MG/ML IJ SOLN
INTRAMUSCULAR | Status: DC | PRN
  Administered 2021-05-26: 6.25 mg via INTRAVENOUS

## 2021-05-26 MED ORDER — SCOPOLAMINE 1 MG/3DAYS TD PT72
MEDICATED_PATCH | TRANSDERMAL | Status: DC | PRN
  Administered 2021-05-26: 1 via TRANSDERMAL

## 2021-05-26 SURGICAL SUPPLY — 83 items
ALCOHOL 70% 16 OZ (MISCELLANEOUS) ×2 IMPLANT
BAG COUNTER SPONGE SURGICOUNT (BAG) ×2 IMPLANT
BAG SPNG CNTER NS LX DISP (BAG) ×1
BANDAGE ESMARK 6X9 LF (GAUZE/BANDAGES/DRESSINGS) IMPLANT
BLADE CLIPPER SURG (BLADE) IMPLANT
BLADE EXCALIBUR 4.0X13 (MISCELLANEOUS) ×2 IMPLANT
BNDG CMPR 9X6 STRL LF SNTH (GAUZE/BANDAGES/DRESSINGS)
BNDG CMPR MED 10X6 ELC LF (GAUZE/BANDAGES/DRESSINGS) ×1
BNDG COHESIVE 4X5 TAN STRL (GAUZE/BANDAGES/DRESSINGS) IMPLANT
BNDG ELASTIC 4X5.8 VLCR STR LF (GAUZE/BANDAGES/DRESSINGS) ×1 IMPLANT
BNDG ELASTIC 6X10 VLCR STRL LF (GAUZE/BANDAGES/DRESSINGS) ×1 IMPLANT
BNDG ESMARK 6X9 LF (GAUZE/BANDAGES/DRESSINGS)
BNDG GAUZE ELAST 4 BULKY (GAUZE/BANDAGES/DRESSINGS) IMPLANT
COVER SURGICAL LIGHT HANDLE (MISCELLANEOUS) ×2 IMPLANT
CUFF TOURN SGL QUICK 18X4 (TOURNIQUET CUFF) IMPLANT
CUFF TOURN SGL QUICK 24 (TOURNIQUET CUFF)
CUFF TOURN SGL QUICK 34 (TOURNIQUET CUFF)
CUFF TOURN SGL QUICK 42 (TOURNIQUET CUFF) IMPLANT
CUFF TRNQT CYL 24X4X16.5-23 (TOURNIQUET CUFF) IMPLANT
CUFF TRNQT CYL 34X4.125X (TOURNIQUET CUFF) IMPLANT
DECANTER SPIKE VIAL GLASS SM (MISCELLANEOUS) ×1 IMPLANT
DRAPE ARTHROSCOPY W/POUCH 114 (DRAPES) ×2 IMPLANT
DRAPE U-SHAPE 47X51 STRL (DRAPES) ×2 IMPLANT
DRSG AQUACEL AG ADV 3.5X 4 (GAUZE/BANDAGES/DRESSINGS) ×2 IMPLANT
DRSG AQUACEL AG ADV 3.5X 6 (GAUZE/BANDAGES/DRESSINGS) ×1 IMPLANT
DRSG PAD ABDOMINAL 8X10 ST (GAUZE/BANDAGES/DRESSINGS) IMPLANT
DRSG TEGADERM 4X4.75 (GAUZE/BANDAGES/DRESSINGS) ×2 IMPLANT
DURAPREP 26ML APPLICATOR (WOUND CARE) ×2 IMPLANT
DW OUTFLOW CASSETTE/TUBE SET (MISCELLANEOUS) ×2 IMPLANT
ELECT REM PT RETURN 9FT ADLT (ELECTROSURGICAL) ×2
ELECTRODE REM PT RTRN 9FT ADLT (ELECTROSURGICAL) ×1 IMPLANT
GAUZE SPONGE 4X4 12PLY STRL (GAUZE/BANDAGES/DRESSINGS) IMPLANT
GAUZE XEROFORM 1X8 LF (GAUZE/BANDAGES/DRESSINGS) IMPLANT
GAUZE XEROFORM 5X9 LF (GAUZE/BANDAGES/DRESSINGS) IMPLANT
GLOVE SRG 8 PF TXTR STRL LF DI (GLOVE) ×1 IMPLANT
GLOVE SURG LTX SZ7 (GLOVE) ×2 IMPLANT
GLOVE SURG LTX SZ8 (GLOVE) ×2 IMPLANT
GLOVE SURG UNDER POLY LF SZ7 (GLOVE) ×2 IMPLANT
GLOVE SURG UNDER POLY LF SZ8 (GLOVE) ×2
GOWN STRL REUS W/ TWL LRG LVL3 (GOWN DISPOSABLE) ×2 IMPLANT
GOWN STRL REUS W/ TWL XL LVL3 (GOWN DISPOSABLE) ×1 IMPLANT
GOWN STRL REUS W/TWL LRG LVL3 (GOWN DISPOSABLE) ×4
GOWN STRL REUS W/TWL XL LVL3 (GOWN DISPOSABLE) ×2
HANDPIECE INTERPULSE COAX TIP (DISPOSABLE)
IMMOBILIZER KNEE 20 (SOFTGOODS) ×2 IMPLANT
IMMOBILIZER KNEE 20 THIGH 36 (SOFTGOODS) IMPLANT
KIT BASIN OR (CUSTOM PROCEDURE TRAY) ×2 IMPLANT
KIT TURNOVER KIT B (KITS) ×2 IMPLANT
MANIFOLD NEPTUNE II (INSTRUMENTS) ×2 IMPLANT
NDL 18GX1X1/2 (RX/OR ONLY) (NEEDLE) IMPLANT
NDL HYPO 25GX1X1/2 BEV (NEEDLE) ×1 IMPLANT
NEEDLE 18GX1X1/2 (RX/OR ONLY) (NEEDLE) IMPLANT
NEEDLE HYPO 25GX1X1/2 BEV (NEEDLE) ×2 IMPLANT
NS IRRIG 1000ML POUR BTL (IV SOLUTION) ×2 IMPLANT
PACK ARTHROSCOPY DSU (CUSTOM PROCEDURE TRAY) ×1 IMPLANT
PACK ORTHO EXTREMITY (CUSTOM PROCEDURE TRAY) ×2 IMPLANT
PAD ARMBOARD 7.5X6 YLW CONV (MISCELLANEOUS) ×4 IMPLANT
PAD CAST 4YDX4 CTTN HI CHSV (CAST SUPPLIES) IMPLANT
PADDING CAST COTTON 4X4 STRL (CAST SUPPLIES)
PADDING CAST COTTON 6X4 STRL (CAST SUPPLIES) ×2 IMPLANT
PORT APPOLLO RF 90DEGREE MULTI (SURGICAL WAND) IMPLANT
SET HNDPC FAN SPRY TIP SCT (DISPOSABLE) IMPLANT
SPONGE T-LAP 18X18 ~~LOC~~+RFID (SPONGE) IMPLANT
SPONGE T-LAP 4X18 ~~LOC~~+RFID (SPONGE) ×2 IMPLANT
STOCKINETTE IMPERVIOUS 9X36 MD (GAUZE/BANDAGES/DRESSINGS) IMPLANT
SUT ETHILON 2 0 FS 18 (SUTURE) IMPLANT
SUT ETHILON 3 0 PS 1 (SUTURE) ×3 IMPLANT
SUT VIC AB 2-0 CT1 27 (SUTURE) ×2
SUT VIC AB 2-0 CT1 TAPERPNT 27 (SUTURE) IMPLANT
SUT VIC AB 3-0 SH 27 (SUTURE)
SUT VIC AB 3-0 SH 27X BRD (SUTURE) IMPLANT
SWAB CULTURE ESWAB REG 1ML (MISCELLANEOUS) IMPLANT
SWAB CULTURE LIQ STUART DBL (MISCELLANEOUS) IMPLANT
SYR 20ML ECCENTRIC (SYRINGE) ×2 IMPLANT
SYR CONTROL 10ML LL (SYRINGE) IMPLANT
SYR TB 1ML LUER SLIP (SYRINGE) ×2 IMPLANT
TOWEL GREEN STERILE (TOWEL DISPOSABLE) ×2 IMPLANT
TOWEL GREEN STERILE FF (TOWEL DISPOSABLE) ×2 IMPLANT
TUBE CONNECTING 12X1/4 (SUCTIONS) ×2 IMPLANT
TUBING ARTHROSCOPY IRRIG 16FT (MISCELLANEOUS) ×2 IMPLANT
UNDERPAD 30X36 HEAVY ABSORB (UNDERPADS AND DIAPERS) ×2 IMPLANT
WATER STERILE IRR 1000ML POUR (IV SOLUTION) ×2 IMPLANT
YANKAUER SUCT BULB TIP NO VENT (SUCTIONS) ×2 IMPLANT

## 2021-05-26 NOTE — Anesthesia Procedure Notes (Signed)
Procedure Name: Intubation Date/Time: 05/26/2021 9:18 PM Performed by: Clovis Cao, CRNA Pre-anesthesia Checklist: Patient identified, Emergency Drugs available, Suction available and Patient being monitored Patient Re-evaluated:Patient Re-evaluated prior to induction Oxygen Delivery Method: Circle system utilized Preoxygenation: Pre-oxygenation with 100% oxygen Induction Type: IV induction, Rapid sequence and Cricoid Pressure applied Laryngoscope Size: Miller and 2 Grade View: Grade I Tube type: Oral Tube size: 7.0 mm Number of attempts: 1 Airway Equipment and Method: Stylet Placement Confirmation: ETT inserted through vocal cords under direct vision, positive ETCO2 and breath sounds checked- equal and bilateral Secured at: 21 cm Tube secured with: Tape Dental Injury: Teeth and Oropharynx as per pre-operative assessment

## 2021-05-26 NOTE — ED Notes (Signed)
Pt changed into hospital gown

## 2021-05-26 NOTE — Brief Op Note (Signed)
   05/26/2021  10:15 PM  PATIENT:  [name]  50 y.o. female  PRE-OPERATIVE DIAGNOSIS:   complex knee laceration with open patella fracture and possible intra-articular involvement left knee POST-OPERATIVE DIAGNOSIS: Same  PROCEDURE:  Procedure(s): ARTHROSCOPIC KNEE WASHOUT IRRIGATION AND EXCISIONAL DEBRIDEMENT EXTREMITY OPEN FRACTURE WITH COMPLEX CLOSURE OF WOUND  SURGEON:  Surgeon(s): Meredith Pel, MD  ASSISTANT: Magnant PA  ANESTHESIA:   general  EBL: 20 ml    Total I/O In: 1000 [I.V.:1000] Out: 25 [Blood:25]  BLOOD ADMINISTERED: none  DRAINS: none   LOCAL MEDICATIONS USED: Vancomycin Marcaine morphine clonidine  SPECIMEN:  No Specimen  COUNTS:  YES  TOURNIQUET:  * Missing tourniquet times found for documented tourniquets in log: [id number] *  DICTATION: .Other Dictation: Dictation Number [id number]  PLAN OF CARE: Admit for overnight observation  PATIENT DISPOSITION:  PACU - hemodynamically stable

## 2021-05-26 NOTE — Progress Notes (Signed)
Chart and pictures and radiographs reviewed.  Patient has traumatic laceration to the anterior aspect of her left knee.  This is an open fracture with the inferior pole of patella affected.  Patella tendon may or may not be structurally compromised.  Plan is for formal evaluation in preop holding with tentative plan arthroscopic washout of the knee with 3 L of irrigating solution followed by evaluation of the patellar tendon open fracture debridement and complex wound closure.  Anticipate possible discharge home with 3-day course of oral antibiotics.  Following surgery.

## 2021-05-26 NOTE — ED Provider Notes (Signed)
[facility] EMERGENCY DEPARTMENT Provider Note   CSN: [id number] Arrival date & time: 05/26/21  1535     History Chief Complaint  Patient presents with   Leg Injury    [name] is a 50 y.o. female.  Patient is a 50 year old female with a history of endometriosis, GERD, asthma, right nephrectomy who is presenting today after an accident.  She was using her chainsaw in the woods cutting down some limbs when it kicked back hitting her left leg.  She reports pain to her left kneecap.  She did have to walk a distance to get back to where she could contact someone and call 911.  She reports quite a bit of bleeding while she was trying to get back to her vehicle.  She denies any numbness or tingling in her foot.  However when she was trying to walk the pain was severe and she had multiple times where she started to feel lightheaded like she might pass out.  She thinks she may have lost 1 pint of blood.  She is still having some intermittent nausea and dizziness.  Mostly there was severe pain in her left knee.  No other areas of injury.  Tetanus shot is up-to-date and was 4 years ago.  The history is provided by the patient.      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 05/18/2017   Atrophic kidney s/p right nepherctomy 05/17/2017 05/18/2017   GERD (gastroesophageal reflux disease)    Complication of anesthesia    Arthritis    Gastric tumor s/p partial gastrectomy 05/17/2016 05/17/2017   Solitary pulmonary nodule 02/18/2017   Genetic testing 04/04/2016   Family history of colon cancer 03/14/2016   Family history of breast cancer in female 03/14/2016   Morbid obesity due to excess calories  ([place]) 01/18/2012   Anxiety state 04/20/2008   Cough variant asthma 04/20/2008   LIVER FUNCTION TESTS, ABNORMAL 04/20/2008   Attention deficit hyperactivity disorder (ADHD) 06/10/2007    Past Surgical History:  Procedure Laterality Date   ABDOMINAL HYSTERECTOMY     CERVICAL DISC SURGERY     CHOLECYSTECTOMY     EUS N/A 03/14/2017   Procedure: UPPER ENDOSCOPIC ULTRASOUND (EUS) LINEAR;  Surgeon: Milus Banister, MD;  Location: [facility];  Service: Endoscopy;  Laterality: N/A;   LAPAROSCOPIC ABDOMINAL EXPLORATION N/A 05/17/2017   Procedure: LAPAROSCOPIC POSSIBLE OPEN REMOVAL OF STOMACH MASS;  Surgeon: Jackolyn Confer, MD;  Location: [facility];  Service: General;  Laterality: N/A;   LAPAROSCOPIC NEPHRECTOMY Right 05/17/2017   Procedure: RIGHT RADICAL NEPHRECTOMY, URETERECTOMY;  Surgeon: Ardis Hughs, MD;  Location: [facility];  Service: Urology;  Laterality: Right;   tubal preganancy       OB History     Gravida  7   Para  3   Term  3   Preterm      AB  4   Living  3      SAB  4   IAB      Ectopic      Multiple      Live Births  Family History  Problem Relation Age of Onset   Hypertension Mother    Cancer Mother 37       "back cancer"   Other Mother        hysterectomy at 62 for "pre-cancerous cervical cells"   Hypertension Father    Colon cancer Father 70   Cervical cancer Sister        full sister dx. unspecified age   Colon polyps Sister        unspecified number   Breast cancer Maternal Grandmother        unspecified age   Diabetes Maternal Grandmother    Diabetes Paternal Grandfather    Colon cancer Paternal Aunt        (x2) paternal aunts with colon cancer at unspecified ages; lim info   Colon cancer Paternal Uncle        dx. unspecified age; lim info   Colon polyps Sister        paternal half-sister w/ colon polyps dx. age 59 or younger - unspecified number   Colon cancer Sister 7       paternal half-sister    Colon polyps  Sister        paternal half-sister; unspecified number   Colon cancer Sister        paternal half-sister dx. before age 39; has had two colon cancers   Breast cancer Sister        paternal half-sister dx under age 22   Renal cancer Sister 55       paternal half-sister dx. w/ "[facility] - lymphoid renal cancer"   Cancer Sister 20       paternal half-sister dx cancer of wrist that was not a skin cancer   Colon polyps Son 32       has had a "bleeding polyp"   Cervical cancer Other 59       niece; +hysterectomy   Breast cancer Paternal Aunt        (x3) paternal aunts with breast cancer at unspecified ages; lim info    Social History   Tobacco Use   Smoking status: Never   Smokeless tobacco: Never  Vaping Use   Vaping Use: Never used  Substance Use Topics   Alcohol use: No   Drug use: No    Home Medications Prior to Admission medications   Medication Sig Start Date End Date Taking? Authorizing Provider  acetaminophen (TYLENOL) 500 MG tablet Take 1,000 mg by mouth every 6 (six) hours as needed (pain).    [provider]  albuterol (PROVENTIL HFA) 108 (90 Base) MCG/ACT inhaler Inhale 2 puffs into the lungs every 6 (six) hours as needed for wheezing or shortness of breath.    [provider]  APPLE CIDER VINEGAR PO Take 1 capsule by mouth daily.    [provider]  Biotin w/ Vitamins C & E (HAIR SKIN & NAILS GUMMIES PO) Take 2 tablets by mouth daily.     [provider]  Cholecalciferol (VITAMIN D PO) Take 2 tablets by mouth daily.    [provider]  Cyanocobalamin (VITAMIN B-12 IJ) Inject 1 each into the muscle every Thursday.    [provider]  Cyanocobalamin (VITAMIN B12 PO) Take 2 tablets by mouth daily.    [provider]  Multiple Vitamins-Minerals (MULTIVITAMIN GUMMIES ADULT) CHEW Chew 2 tablets by mouth daily.    [provider]  OVER THE COUNTER MEDICATION Take 4 tablets by mouth daily. Juice Plus  [provider]  pantoprazole (PROTONIX) 40 MG tablet TAKE ONE TABLET BY MOUTH TWICE DAILY 08/18/20   Ladene Artist, MD  phentermine (ADIPEX-P) 37.5 MG tablet Take 37.5 mg by mouth daily before breakfast.    [provider]    Allergies    Amoxicillin, Keflex [cephalexin], Penicillins, Baby powder [methylbenzethonium], Ibuprofen, and Doxycycline  Review of Systems   Review of Systems  All other systems reviewed and are negative.  Physical Exam Updated Vital Signs BP (!) 158/101   Pulse (!) 106   Temp 98.2 F (36.8 C)   SpO2 98%   Physical Exam Vitals and nursing note reviewed.  Constitutional:      General: She is not in acute distress.    Appearance: She is well-developed.  HENT:     Head: Normocephalic and atraumatic.  Eyes:     Conjunctiva/sclera: Conjunctivae normal.     Pupils: Pupils are equal, round, and reactive to light.  Cardiovascular:     Rate and Rhythm: Regular rhythm. Tachycardia present.     Pulses: Normal pulses.     Heart sounds: No murmur heard. Pulmonary:     Effort: Pulmonary effort is normal. No respiratory distress.     Breath sounds: Normal breath sounds. No wheezing or rales.  Abdominal:     General: There is no distension.     Palpations: Abdomen is soft.     Tenderness: There is no abdominal tenderness. There is no guarding or rebound.  Musculoskeletal:        General: Tenderness and signs of injury present. Normal range of motion.     Cervical back: Normal range of motion and neck supple.     Comments: 5 cm laceration across the left distal patella.  Minimal blood oozing from the wound.  Patient is able to flex the knee to approximately 90 degrees or a little more.  Normal function of the the left foot with normal flexion and extension of the ankle.  2+ DP pulse.  Sensation intact distal to the wound.  Skin:    General: Skin is warm and dry.     Findings: No erythema or rash.  Neurological:     Mental Status: She is  alert and oriented to person, place, and time. Mental status is at baseline.  Psychiatric:        Mood and Affect: Mood normal.        Behavior: Behavior normal.       ED Results / Procedures / Treatments   Labs (all labs ordered are listed, but only abnormal results are displayed) Labs Reviewed - No data to display  EKG None  Radiology CT Knee Left Wo Contrast  Result Date: 05/26/2021 CLINICAL DATA:  Knee trauma, penetrating r/o open joint knee vs chainsaw, injured by change on this afternoon EXAM: CT OF THE left KNEE WITHOUT CONTRAST TECHNIQUE: Multidetector CT imaging of the left knee was performed according to the standard protocol. Multiplanar CT image reconstructions were also generated. COMPARISON:  None. FINDINGS: Bones/Joint/Cartilage There is a fracture of the anterior medial inferior patella with multiple adjacent bony fragments. There is intra-articular gas noted suggestive of arthrotomy. Trace joint fluid. Ligaments Suboptimally assessed by CT. The intra-articular gas, soft tissue laceration, patellar fracture are adjacent to the patellar attachment of the medial retinaculum, which demonstrates periligamentous edema. Muscles and Tendons No muscle atrophy. The patellar tendon appears intact. The quadriceps tendon appears intact. Soft tissues Mild adjacent soft tissue swelling. IMPRESSION: Soft tissue laceration to the  anterior inferior knee with underlying chip fracture of the anteromedial inferior patella with multiple small adjacent bony fragments. There is intra-articular gas suggestive of traumatic arthrotomy. The laceration and intra-articular gas is in close proximity to the patellar attachment of the medial retinaculum, which could be injured, suboptimally assessed on CT. Electronically Signed   By: Maurine Simmering   On: 05/26/2021 17:14    Procedures Procedures   Medications Ordered in ED Medications  HYDROmorphone (DILAUDID) injection 0.5 mg (has no administration in time  range)  ondansetron (ZOFRAN) injection 4 mg (has no administration in time range)  levofloxacin (LEVAQUIN) IVPB 750 mg (has no administration in time range)    ED Course  I have reviewed the triage vital signs and the nursing notes.  Pertinent labs & imaging results that were available during my care of the patient were reviewed by me and considered in my medical decision making (see chart for details).    MDM Rules/Calculators/A&P                           Patient presenting with chainsaw versus knee today.  There is approximately a 4 to 5 cm laceration across the left patella.  She does have full range of motion of the knee up to 90 degrees with low suspicion for tendon injury.  Possibility for open joint given where the laceration is.  Otherwise function intact distal to the injury.  Pulse intact.  Tetanus shot is up-to-date.  Patient's vital signs are reassuring.  She does have mild tachycardia but normal blood pressure.  Will give pain control.  We will do CT to evaluate for fracture or open joint.  Patient given Levaquin due to concern for open fracture as she has an anaphylactic reaction to penicillin and hive reaction to cephalosporins.  6:31 PM Patient has been kept n.p.o.  COVID test is pending.  CT she laceration to the anterior inferior knee with underlying chip fracture of the anterior medial inferior patella with multiple small adjacent bone fragments.  There is intra-articular gas suggestive of traumatic arthrotomy.  Spoke with orthopedic surgery who recommends that patient needs to go to the OR for a washout.  Findings discussed with the patient.  MDM   Amount and/or Complexity of Data Reviewed Tests in the radiology section of CPT: ordered and reviewed Independent visualization of images, tracings, or specimens: yes     Final Clinical Impression(s) / ED Diagnoses Final diagnoses:  Laceration of left knee with complication, initial encounter  Open wound of left knee,  initial encounter    Rx / DC Orders ED Discharge Orders     None        Blanchie Dessert, MD 05/26/21 [phone]

## 2021-05-26 NOTE — ED Notes (Signed)
Pt NPO since 1030 today.

## 2021-05-26 NOTE — ED Triage Notes (Signed)
Pt here via [facility] after chainsaw kicked back and hit her knee. Pressure dressing applied pta. Approx 5 inch lac to knee. CNS intact distal to injury. Pt endorses nausea and dizziness. Pt thinks she lost approx 1 pint of blood.  155/77 HR 102 98% RA

## 2021-05-26 NOTE — Consult Note (Addendum)
ORTHOPAEDIC CONSULTATION  REQUESTING PHYSICIAN: Blanchie Dessert, MD  Chief Complaint: "My left knee hurts"  HPI: [name] is a 50 y.o. female who presents with left knee injury.  Had chainsaw injury to the left knee that she sustained around 2:00 this afternoon.  No history of prior injury to the left knee or any prior surgery to the left knee.  No history of DVT/PE.  No significant medical history.  She was seen in the emergency department where she was assessed and orthopedics was consulted.  IV antibiotics given in the emergency department  Past Medical History:  Diagnosis Date   Arthritis    Asthma    Complication of anesthesia    respirations slow down after coming out from Anesthesia   Endometriosis    GERD (gastroesophageal reflux disease)    Past Surgical History:  Procedure Laterality Date   ABDOMINAL HYSTERECTOMY     CERVICAL DISC SURGERY     CHOLECYSTECTOMY     EUS N/A 03/14/2017   Procedure: UPPER ENDOSCOPIC ULTRASOUND (EUS) LINEAR;  Surgeon: Milus Banister, MD;  Location: [facility];  Service: Endoscopy;  Laterality: N/A;   LAPAROSCOPIC ABDOMINAL EXPLORATION N/A 05/17/2017   Procedure: LAPAROSCOPIC POSSIBLE OPEN REMOVAL OF STOMACH MASS;  Surgeon: Jackolyn Confer, MD;  Location: [facility];  Service: General;  Laterality: N/A;   LAPAROSCOPIC NEPHRECTOMY Right 05/17/2017   Procedure: RIGHT RADICAL NEPHRECTOMY, URETERECTOMY;  Surgeon: Ardis Hughs, MD;  Location: [facility];  Service: Urology;  Laterality: Right;   tubal preganancy     Social History   Socioeconomic History   Marital status: Married    Spouse name: Not on file   Number of children: Not on file   Years of education: Not on file   Highest education level: Not on file  Occupational History   Not on file  Tobacco Use   Smoking status: Never   Smokeless tobacco: Never  Vaping Use   Vaping Use: Never used  Substance and Sexual Activity   Alcohol use: No   Drug use: No   Sexual  activity: Yes    Birth control/protection: Surgical  Other Topics Concern   Not on file  Social History Narrative   Not on file   Social Determinants of Health   Financial Resource Strain: Not on file  Food Insecurity: Not on file  Transportation Needs: Not on file  Physical Activity: Not on file  Stress: Not on file  Social Connections: Not on file   Family History  Problem Relation Age of Onset   Hypertension Mother    Cancer Mother 69       "back cancer"   Other Mother        hysterectomy at 13 for "pre-cancerous cervical cells"   Hypertension Father    Colon cancer Father 64   Cervical cancer Sister        full sister dx. unspecified age   Colon polyps Sister        unspecified number   Breast cancer Maternal Grandmother        unspecified age   Diabetes Maternal Grandmother    Diabetes Paternal Grandfather    Colon cancer Paternal Aunt        (x2) paternal aunts with colon cancer at unspecified ages; lim info   Colon cancer Paternal Uncle        dx. unspecified age; lim info   Colon polyps Sister        paternal half-sister w/ colon  polyps dx. age 84 or younger - unspecified number   Colon cancer Sister 7       paternal half-sister    Colon polyps Sister        paternal half-sister; unspecified number   Colon cancer Sister        paternal half-sister dx. before age 36; has had two colon cancers   Breast cancer Sister        paternal half-sister dx under age 80   Renal cancer Sister 34       paternal half-sister dx. w/ "[facility] - lymphoid renal cancer"   Cancer Sister 74       paternal half-sister dx cancer of wrist that was not a skin cancer   Colon polyps Son 66       has had a "bleeding polyp"   Cervical cancer Other 21       niece; +hysterectomy   Breast cancer Paternal Aunt        (x3) paternal aunts with breast cancer at unspecified ages; lim info   - negative except otherwise stated in the family history section Allergies  Allergen Reactions    Amoxicillin Anaphylaxis    All "cillins" pt was 36-42 years old  Has patient had a PCN reaction causing immediate rash, facial/tongue/throat swelling, SOB or lightheadedness with hypotension: yes Has patient had a PCN reaction causing severe rash involving mucus membranes or skin necrosis: unknown Has patient had a PCN reaction that required hospitalization yes Has patient had a PCN reaction occurring within the last 10 years:no If all of the above answers are "NO", then may proceed with Cephalosporin use.    Keflex [Cephalexin] Anaphylaxis   Penicillins Anaphylaxis    Has patient had a PCN reaction causing immediate rash, facial/tongue/throat swelling, SOB or lightheadedness with hypotension:yes Has patient had a PCN reaction causing severe rash involving mucus membranes or skin necrosis: unknown Has patient had a PCN reaction that required hospitalization yes  Has patient had a PCN reaction occurring within the last 10 years: no If all of the above answers are "NO", then may proceed with Cephalosporin use.    Baby Powder [Methylbenzethonium] Hives   Ibuprofen Hives   Doxycycline Diarrhea and Rash   Prior to Admission medications   Medication Sig Start Date End Date Taking? Authorizing Provider  acetaminophen (TYLENOL) 500 MG tablet Take 1,000 mg by mouth every 6 (six) hours as needed (pain).    [provider]  albuterol (PROVENTIL HFA) 108 (90 Base) MCG/ACT inhaler Inhale 2 puffs into the lungs every 6 (six) hours as needed for wheezing or shortness of breath.    [provider]  APPLE CIDER VINEGAR PO Take 1 capsule by mouth daily.    [provider]  Biotin w/ Vitamins C & E (HAIR SKIN & NAILS GUMMIES PO) Take 2 tablets by mouth daily.     [provider]  Cholecalciferol (VITAMIN D PO) Take 2 tablets by mouth daily.    [provider]  Cyanocobalamin (VITAMIN B-12 IJ) Inject 1 each into the muscle every Thursday.    [provider]   Cyanocobalamin (VITAMIN B12 PO) Take 2 tablets by mouth daily.    [provider]  Multiple Vitamins-Minerals (MULTIVITAMIN GUMMIES ADULT) CHEW Chew 2 tablets by mouth daily.    [provider]  OVER THE COUNTER MEDICATION Take 4 tablets by mouth daily. Juice Plus     [provider]  pantoprazole (PROTONIX) 40 MG tablet TAKE ONE TABLET  BY MOUTH TWICE DAILY 08/18/20   Ladene Artist, MD  phentermine (ADIPEX-P) 37.5 MG tablet Take 37.5 mg by mouth daily before breakfast.    [provider]   CT Knee Left Wo Contrast  Result Date: 05/26/2021 CLINICAL DATA:  Knee trauma, penetrating r/o open joint knee vs chainsaw, injured by change on this afternoon EXAM: CT OF THE left KNEE WITHOUT CONTRAST TECHNIQUE: Multidetector CT imaging of the left knee was performed according to the standard protocol. Multiplanar CT image reconstructions were also generated. COMPARISON:  None. FINDINGS: Bones/Joint/Cartilage There is a fracture of the anterior medial inferior patella with multiple adjacent bony fragments. There is intra-articular gas noted suggestive of arthrotomy. Trace joint fluid. Ligaments Suboptimally assessed by CT. The intra-articular gas, soft tissue laceration, patellar fracture are adjacent to the patellar attachment of the medial retinaculum, which demonstrates periligamentous edema. Muscles and Tendons No muscle atrophy. The patellar tendon appears intact. The quadriceps tendon appears intact. Soft tissues Mild adjacent soft tissue swelling. IMPRESSION: Soft tissue laceration to the anterior inferior knee with underlying chip fracture of the anteromedial inferior patella with multiple small adjacent bony fragments. There is intra-articular gas suggestive of traumatic arthrotomy. The laceration and intra-articular gas is in close proximity to the patellar attachment of the medial retinaculum, which could be injured, suboptimally assessed on CT. Electronically Signed    By: Maurine Simmering   On: 05/26/2021 17:14   - pertinent xrays, CT, MRI studies were reviewed and independently interpreted  Positive ROS: All other systems have been reviewed and were otherwise negative with the exception of those mentioned in the HPI and as above.  Physical Exam: General: Alert, no acute distress Psychiatric: Patient is competent for consent with normal mood and affect Lymphatic: No axillary or cervical lymphadenopathy Cardiovascular: No pedal edema Respiratory: No cyanosis, no use of accessory musculature GI: No organomegaly, abdomen is soft and non-tender    Images:  '@ENCIMAGES'$ @  Labs:  Lab Results  Component Value Date   ESRSEDRATE 14 11/19/2017   REPTSTATUS 05/22/2017 FINAL 05/19/2017   CULT 30,000 COLONIES/mL CITROBACTER FREUNDII (A) 05/19/2017   LABORGA CITROBACTER FREUNDII (A) 05/19/2017    Lab Results  Component Value Date   ALBUMIN 4.2 11/19/2017   ALBUMIN 4.1 06/22/2017   ALBUMIN 4.1 05/14/2017      MUSCULOSKELETAL:   Ortho exam demonstrates left knee with gaping wound to the anterior knee near the inferior pole of the patella.  Approximately 10 cm.  Able to perform straight leg raise without extensor lag.  Intact dorsiflexion to both extremities.  2+ DP pulse of the left lower extremity.  No pain or crepitus with passive motion of the bilateral ankles, hips, right knee, bilateral shoulder, elbow, wrists.  Assessment: Chainsaw laceration to the anterior left knee with fragmentation of the patella and penetration into the joint.  Plan: Patient has laceration from chainsaw to the anterior left knee with CT scan that demonstrates infiltration of gas into the joint with fragmentation of the inferior pole of the patella.  She has intact extensor mechanism on exam and no suspected injury to the peroneal nerve with preserved dorsiflexion.  Plan at this time is going to the operating room for left knee arthroscopy with irrigation and possible debridement  as well as complex wound closure from the laceration.  Discussed the risks and benefits of the procedure with the patient including risk of knee stiffness, need for revision surgery, postoperative infection, medical complication from surgery.  After discussion, patient wishes to proceed  with surgery and she understands the risk of developing infection without surgical intervention.  Plan to have patient stay overnight in the hospital with knee immobilizer and IV antibiotics overnight with likely discharge home tomorrow if she is able to ambulate well.  Follow-up  in clinic in 7 to 10 days postoperatively for recheck and wound check.  Anticipate overnight stay for IV antibiotics x2 doses and weightbearing as tolerated with knee immobilizer postop.  Thank you for the consult and the opportunity to see Ms. Berton [facility] [phone] 8:39 PM

## 2021-05-26 NOTE — Anesthesia Preprocedure Evaluation (Addendum)
Anesthesia Evaluation  Patient identified by MRN, date of birth, ID band Patient awake    Reviewed: Allergy & Precautions, NPO status , Patient's Chart, lab work & pertinent test results  Airway Mallampati: II  TM Distance: >3 FB Neck ROM: Full    Dental no notable dental hx.    Pulmonary asthma ,    Pulmonary exam normal breath sounds clear to auscultation       Cardiovascular negative cardio ROS Normal cardiovascular exam Rhythm:Regular Rate:Normal     Neuro/Psych PSYCHIATRIC DISORDERS Anxiety negative neurological ROS     GI/Hepatic Neg liver ROS, GERD  Medicated and Controlled,  Endo/Other  negative endocrine ROS  Renal/GU negative Renal ROS     Musculoskeletal  (+) Arthritis ,   Abdominal   Peds  (+) ADHD Hematology negative hematology ROS (+)   Anesthesia Other Findings traumatic laceration to the left knee  Reproductive/Obstetrics                            Anesthesia Physical Anesthesia Plan  ASA: 2 and emergent  Anesthesia Plan: General   Post-op Pain Management:    Induction: Intravenous and Rapid sequence  PONV Risk Score and Plan: 3 and Ondansetron, Dexamethasone, Midazolam, Treatment may vary due to age or medical condition, Promethazine and Scopolamine patch - Pre-op  Airway Management Planned: Oral ETT  Additional Equipment:   Intra-op Plan:   Post-operative Plan: Extubation in OR  Informed Consent: I have reviewed the patients History and Physical, chart, labs and discussed the procedure including the risks, benefits and alternatives for the proposed anesthesia with the patient or authorized representative who has indicated his/her understanding and acceptance.     Dental advisory given  Plan Discussed with: CRNA  Anesthesia Plan Comments:         Anesthesia Quick Evaluation

## 2021-05-26 NOTE — Transfer of Care (Signed)
Immediate Anesthesia Transfer of Care Note  Patient: Malaia Colee  Procedure(s) Performed: ARTHROSCOPIC KNEE WASHOUT (Left: Knee) IRRIGATION AND DEBRIDEMENT EXTREMITY WITH COMPLEX CLOSURE OF WOUND (Left: Knee)  Patient Location: PACU  Anesthesia Type:General  Level of Consciousness: drowsy  Airway & Oxygen Therapy: Patient Spontanous Breathing and Patient connected to face mask oxygen  Post-op Assessment: Report given to RN and Post -op Vital signs reviewed and stable  Post vital signs: Reviewed and stable  Last Vitals:  Vitals Value Taken Time  BP 104/63 05/26/21 2239  Temp    Pulse 95 05/26/21 2243  Resp 12 05/26/21 2243  SpO2 100 % 05/26/21 2243  Vitals shown include unvalidated device data.  Last Pain:  Vitals:   05/26/21 1933  PainSc: 5          Complications: No notable events documented.

## 2021-05-27 ENCOUNTER — Encounter (HOSPITAL_COMMUNITY): Payer: Self-pay | Admitting: Orthopedic Surgery

## 2021-05-27 DIAGNOSIS — Z9889 Other specified postprocedural states: Secondary | ICD-10-CM

## 2021-05-27 MED ORDER — METHOCARBAMOL 1000 MG/10ML IJ SOLN: 500.0000 mg | Freq: Four times a day (QID) | INTRAVENOUS | Status: DC | PRN

## 2021-05-27 MED ORDER — OXYCODONE HCL 5 MG PO TABS: 5.0000 mg | ORAL_TABLET | ORAL | 0 refills | Status: DC | PRN

## 2021-05-27 MED ORDER — SULFAMETHOXAZOLE-TRIMETHOPRIM 800-160 MG PO TABS: 1.0000 | ORAL_TABLET | Freq: Two times a day (BID) | ORAL | 0 refills | Status: DC

## 2021-05-27 MED ORDER — ACETAMINOPHEN 325 MG PO TABS: 325.0000 mg | ORAL_TABLET | Freq: Four times a day (QID) | ORAL | Status: DC | PRN

## 2021-05-27 MED ORDER — METHOCARBAMOL 500 MG PO TABS: 500.0000 mg | ORAL_TABLET | Freq: Four times a day (QID) | ORAL | Status: DC | PRN

## 2021-05-27 MED ORDER — OXYCODONE HCL 5 MG PO TABS
5.0000 mg | ORAL_TABLET | ORAL | Status: DC | PRN
  Administered 2021-05-27: 10 mg via ORAL
  Administered 2021-05-27: 5 mg via ORAL
  Filled 2021-05-27: qty 2
  Filled 2021-05-27: qty 1

## 2021-05-27 MED ORDER — SULFAMETHOXAZOLE-TRIMETHOPRIM 800-160 MG PO TABS
1.0000 | ORAL_TABLET | Freq: Two times a day (BID) | ORAL | 0 refills | Status: DC
Start: 2021-05-27 — End: 2021-09-06

## 2021-05-27 MED ORDER — ONDANSETRON HCL 4 MG PO TABS: 4.0000 mg | ORAL_TABLET | Freq: Four times a day (QID) | ORAL | Status: DC | PRN

## 2021-05-27 MED ORDER — ACETAMINOPHEN 500 MG PO TABS
1000.0000 mg | ORAL_TABLET | Freq: Four times a day (QID) | ORAL | Status: DC
  Administered 2021-05-27 (×2): 1000 mg via ORAL
  Filled 2021-05-27 (×2): qty 2

## 2021-05-27 MED ORDER — HYDROMORPHONE HCL 1 MG/ML IJ SOLN: 0.5000 mg | INTRAMUSCULAR | Status: DC | PRN

## 2021-05-27 MED ORDER — METHOCARBAMOL 500 MG PO TABS
500.0000 mg | ORAL_TABLET | Freq: Four times a day (QID) | ORAL | 0 refills | Status: DC | PRN
Start: 2021-05-27 — End: 2021-05-27

## 2021-05-27 MED ORDER — METOCLOPRAMIDE HCL 5 MG PO TABS: 5.0000 mg | ORAL_TABLET | Freq: Three times a day (TID) | ORAL | Status: DC | PRN

## 2021-05-27 MED ORDER — ONDANSETRON HCL 4 MG/2ML IJ SOLN
4.0000 mg | Freq: Four times a day (QID) | INTRAMUSCULAR | Status: DC | PRN
  Administered 2021-05-27: 4 mg via INTRAVENOUS
  Filled 2021-05-27: qty 2

## 2021-05-27 MED ORDER — DOCUSATE SODIUM 100 MG PO CAPS: 100.0000 mg | ORAL_CAPSULE | Freq: Two times a day (BID) | ORAL | 0 refills | Status: DC

## 2021-05-27 MED ORDER — METHOCARBAMOL 500 MG PO TABS: 500.0000 mg | ORAL_TABLET | Freq: Three times a day (TID) | ORAL | 0 refills | Status: DC | PRN

## 2021-05-27 MED ORDER — DOCUSATE SODIUM 100 MG PO CAPS
100.0000 mg | ORAL_CAPSULE | Freq: Two times a day (BID) | ORAL | Status: DC
  Administered 2021-05-27: 100 mg via ORAL
  Filled 2021-05-27: qty 1

## 2021-05-27 MED ORDER — ONDANSETRON HCL 4 MG PO TABS: 4.0000 mg | ORAL_TABLET | Freq: Three times a day (TID) | ORAL | 0 refills | Status: DC | PRN

## 2021-05-27 MED ORDER — LACTATED RINGERS IV SOLN: INTRAVENOUS | Status: DC

## 2021-05-27 MED ORDER — METOCLOPRAMIDE HCL 5 MG/ML IJ SOLN: 5.0000 mg | Freq: Three times a day (TID) | INTRAMUSCULAR | Status: DC | PRN

## 2021-05-27 NOTE — Progress Notes (Signed)
05/27/21 1251  PT Visit Information  Last PT Received On 05/27/21  Assistance Needed +1  History of Present Illness Pt is a 50 y/o female admitted  7/22 following traumatic laceration of L knee  with patellar fx. Pt is s/p arthroscopic knee washout  and I and D on 7/22. PMH includes asthma.  Subjective Data  Patient Stated Goal to go home  Precautions  Precautions Fall;Other (comment)  Precaution Comments No knee ROM  Required Braces or Orthoses Knee Immobilizer - Left  Knee Immobilizer - Left On at all times  Restrictions  Weight Bearing Restrictions Yes  LLE Weight Bearing WBAT (in KI)  Other Position/Activity Restrictions WBAT in KI  Pain Assessment  Pain Assessment Faces  Faces Pain Scale 6  Pain Location L knee  Pain Descriptors / Indicators Cramping;Aching ("pinching")  Pain Intervention(s) Limited activity within patient's tolerance;Monitored during session;Repositioned  Cognition  Arousal/Alertness Awake/alert  Behavior During Therapy WFL for tasks assessed/performed  Overall Cognitive Status Within Functional Limits for tasks assessed  Bed Mobility  Overal bed mobility Needs Assistance  Bed Mobility Supine to Sit;Sit to Supine  Supine to sit Supervision;HOB elevated  Sit to supine Supervision  General bed mobility comments Supervision for safety with [facility] elevated  Transfers  Overall transfer level Needs assistance  Equipment used Rolling walker (2 wheeled)  Transfers Sit to/from Stand  Sit to Stand Supervision  General transfer comment Supervision for safety.  Ambulation/Gait  Ambulation/Gait assistance Min guard  Gait Distance (Feet) 40 Feet  Assistive device Rolling walker (2 wheeled)  Gait Pattern/deviations Step-to pattern;Decreased step length - right;Decreased step length - left;Decreased weight shift to left;Antalgic  General Gait Details Slow, antalgic gait. Improved tolerance noted and able to increase ambulation distance. Cues for sequencing using  RW.  Gait velocity Decreased  Balance  Overall balance assessment Needs assistance  Sitting-balance support No upper extremity supported;Feet supported  Sitting balance-Leahy Scale Good  Standing balance support Bilateral upper extremity supported;During functional activity  Standing balance-Leahy Scale Poor  Standing balance comment Reliant on BUE support  General Comments  General comments (skin integrity, edema, etc.) Pt's husband present  PT - End of Session  Equipment Utilized During Treatment Gait belt;Left knee immobilizer  Activity Tolerance Patient tolerated treatment well (nausea)  Patient left in bed;with call bell/phone within reach;with family/visitor present  Nurse Communication Mobility status   PT - Assessment/Plan  PT Plan Current plan remains appropriate  PT Visit Diagnosis Other abnormalities of gait and mobility (R26.89);Pain;Difficulty in walking, not elsewhere classified (R26.2)  Pain - Right/Left Left  Pain - part of body Knee  PT Frequency (ACUTE ONLY) Min 5X/week  Follow Up Recommendations No PT follow up  PT equipment Rolling walker with 5" wheels  AM-PAC PT "6 Clicks" Mobility Outcome Measure (Version 2)  Help needed turning from your back to your side while in a flat bed without using bedrails? 4  Help needed moving from lying on your back to sitting on the side of a flat bed without using bedrails? 3  Help needed moving to and from a bed to a chair (including a wheelchair)? 3  Help needed standing up from a chair using your arms (e.g., wheelchair or bedside chair)? 3  Help needed to walk in hospital room? 3  Help needed climbing 3-5 steps with a railing?  2  6 Click Score 18  Consider Recommendation of Discharge To: Home with [facility]  Progressive Mobility  What is the highest level of mobility based on  the progressive mobility assessment? Level 5 (Walks with assist in room/hall) - Balance while stepping forward/back and can walk in room with assist -  Complete  Mobility Ambulated with assistance in hallway  PT Goal Progression  Progress towards PT goals Progressing toward goals  Acute Rehab PT Goals  PT Goal Formulation With patient/family  Time For Goal Achievement 06/10/21  Potential to Achieve Goals Good  PT Time Calculation  PT Start Time (ACUTE ONLY) 1136  PT Stop Time (ACUTE ONLY) 1154  PT Time Calculation (min) (ACUTE ONLY) 18 min  PT General Charges  $$ ACUTE PT VISIT 1 Visit  PT Treatments  $Gait Training 8-22 mins    Pt progressing well towards goals. Able to tolerate increased mobility this session and increased gait distance using RW. Required min guard to supervision for safety. Current recommendations appropriate. Will continue to follow acutely.   Reuel Derby, PT, DPT  Acute Rehabilitation Services  Pager: [phone] Office: [phone]

## 2021-05-27 NOTE — Plan of Care (Signed)

## 2021-05-27 NOTE — Plan of Care (Signed)
  Problem: Pain Managment: Goal: General experience of comfort will improve Outcome: Progressing   Problem: Safety: Goal: Ability to remain free from injury will improve Outcome: Progressing   Problem: Skin Integrity: Goal: Risk for impaired skin integrity will decrease Outcome: Progressing   

## 2021-05-27 NOTE — Evaluation (Addendum)
Physical Therapy Evaluation Patient Details Name: [name] MRN: [id number] DOB: 12-19-1970 Today's Date: 05/27/2021   History of Present Illness  Pt is a 50 y/o female admitted  7/22 following traumatic laceration of L knee  with patellar fx. Pt is s/p arthroscopic knee washout  and I and D on 7/22. PMH includes asthma.  Clinical Impression  Pt admitted secondary to problem above with deficits below. Pt requiring min guard A to stand and take a few steps with RW. Pt became hot and nauseous so further mobility deferred. Plan to see for second session to ensure pt progresses well with mobility. Will continue to follow acutely.     Follow Up Recommendations No PT follow up    Equipment Recommendations  Rolling walker with 5" wheels    Recommendations for Other Services       Precautions / Restrictions Precautions Precautions: Fall;Other (comment) Precaution Comments: No knee ROM Required Braces or Orthoses: Knee Immobilizer - Left Knee Immobilizer - Left: On at all times Restrictions Weight Bearing Restrictions: Yes LLE Weight Bearing: Weight bearing as tolerated (In KI)      Mobility  Bed Mobility Overal bed mobility: Needs Assistance Bed Mobility: Supine to Sit;Sit to Supine     Supine to sit: Min assist Sit to supine: Min assist   General bed mobility comments: Min A for LLE management.    Transfers Overall transfer level: Needs assistance Equipment used: Rolling walker (2 wheeled) Transfers: Sit to/from Stand Sit to Stand: Min guard         General transfer comment: Min guard for safety.  Ambulation/Gait Ambulation/Gait assistance: Min guard Gait Distance (Feet): 2 Feet Assistive device: Rolling walker (2 wheeled) Gait Pattern/deviations: Step-to pattern;Decreased step length - right;Decreased step length - left;Decreased weight shift to left;Antalgic Gait velocity: Decreased   General Gait Details: Slow, antalgic gait. Was able to take a  few steps, however, became very hot and nauseous and had to return to sitting. Further mobility deferred.  Stairs            Wheelchair Mobility    Modified Rankin (Stroke Patients Only)       Balance Overall balance assessment: Needs assistance Sitting-balance support: No upper extremity supported;Feet supported Sitting balance-Leahy Scale: Good     Standing balance support: Bilateral upper extremity supported;During functional activity Standing balance-Leahy Scale: Poor Standing balance comment: Reliant on BUE support                             Pertinent Vitals/Pain Pain Assessment: Faces Faces Pain Scale: Hurts even more Pain Location: L knee Pain Descriptors / Indicators: Cramping;Aching ("pinching") Pain Intervention(s): Monitored during session;Limited activity within patient's tolerance;Repositioned    Home Living Family/patient expects to be discharged to:: Private residence Living Arrangements: Spouse/significant other Available Help at Discharge: Family;Available 24 hours/day Type of Home: House Home Access: Level entry     Home Layout: One level Home Equipment: Shower seat      Prior Function Level of Independence: Independent               Hand Dominance        Extremity/Trunk Assessment   Upper Extremity Assessment Upper Extremity Assessment: Overall WFL for tasks assessed    Lower Extremity Assessment Lower Extremity Assessment: LLE deficits/detail LLE Deficits / Details: Deficits consistent with post op pain and weakness. In [place] throughout.    Cervical / Trunk Assessment Cervical / Trunk Assessment:  Normal  Communication   Communication: No difficulties  Cognition Arousal/Alertness: Awake/alert Behavior During Therapy: WFL for tasks assessed/performed Overall Cognitive Status: Within Functional Limits for tasks assessed                                        General Comments General comments  (skin integrity, edema, etc.): Pt's husband present throughout session    Exercises     Assessment/Plan    PT Assessment Patient needs continued PT services  PT Problem List Decreased range of motion;Decreased balance;Decreased mobility;Decreased activity tolerance;Decreased knowledge of use of DME;Decreased knowledge of precautions;Pain       PT Treatment Interventions DME instruction;Gait training;Functional mobility training;Therapeutic activities;Therapeutic exercise;Balance training;Patient/family education    PT Goals (Current goals can be found in the Care Plan section)  Acute Rehab PT Goals Patient Stated Goal: to go home PT Goal Formulation: With patient/family Time For Goal Achievement: 06/10/21 Potential to Achieve Goals: Good    Frequency Min 5X/week   Barriers to discharge        Co-evaluation               AM-PAC PT "6 Clicks" Mobility  Outcome Measure Help needed turning from your back to your side while in a flat bed without using bedrails?: None Help needed moving from lying on your back to sitting on the side of a flat bed without using bedrails?: A Little Help needed moving to and from a bed to a chair (including a wheelchair)?: A Little Help needed standing up from a chair using your arms (e.g., wheelchair or bedside chair)?: A Little Help needed to walk in hospital room?: A Little Help needed climbing 3-5 steps with a railing? : A Lot 6 Click Score: 18    End of Session Equipment Utilized During Treatment: Gait belt;Left knee immobilizer Activity Tolerance: Treatment limited secondary to medical complications (Comment) (nausea) Patient left: in bed;with call bell/phone within reach;with family/visitor present Nurse Communication: Mobility status PT Visit Diagnosis: Other abnormalities of gait and mobility (R26.89);Pain;Difficulty in walking, not elsewhere classified (R26.2) Pain - Right/Left: Left Pain - part of body: Knee    Time:  [id number] PT Time Calculation (min) (ACUTE ONLY): 24 min   Charges:   PT Evaluation $PT Eval Low Complexity: 1 Low PT Treatments $Therapeutic Activity: 8-22 mins        [name], DPT  Acute Rehabilitation Services  Pager: [phone] Office: [phone]   [name] 05/27/2021, 8:57 AM

## 2021-05-27 NOTE — TOC Transition Note (Signed)
Transition of Care [facility]) - CM/SW Discharge Note   Patient Details  Name: [name] MRN: [id number] Date of Birth: 1971-01-12  [facility] CM/SW Contact:  Bartholomew Crews, RN Phone Number: [phone] 05/27/2021, 12:14 PM   Clinical Narrative:     Spoke with patient and husband at the bedside. Discussed plan for transition home today. 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 Patient stated that her PCP is Dr. Merri Ray. Discussed recommendation for RW - patient agreeable. Referral to [employer]. Spouse to provide transportation home at discharge. No further TOC needs identified.  Final next level of care: Home/Self Care Barriers to Discharge: No Barriers Identified   Patient Goals and CMS Choice Patient states their goals for this hospitalization and ongoing recovery are:: return home with her husband CMS Medicare.gov Compare Post Acute Care list provided to:: Patient Choice offered to / list presented to : Patient  Discharge Placement                       Discharge Plan and Services                DME Arranged: Walker rolling DME Agency: [employer] Date DME Agency Contacted: 05/27/21 Time DME Agency Contacted: 60 Representative spoke with at DME Agency: [place]: NA [place] Agency: NA        Social Determinants of Health ([place]) Interventions     Readmission Risk Interventions No flowsheet data found.

## 2021-05-27 NOTE — Anesthesia Postprocedure Evaluation (Signed)
Anesthesia Post Note  Patient: [name]  Procedure(s) Performed: ARTHROSCOPIC KNEE WASHOUT (Left: Knee) IRRIGATION AND DEBRIDEMENT EXTREMITY WITH COMPLEX CLOSURE OF WOUND (Left: Knee)     Patient location during evaluation: PACU Anesthesia Type: General Level of consciousness: awake Pain management: pain level controlled Vital Signs Assessment: post-procedure vital signs reviewed and stable Respiratory status: spontaneous breathing, nonlabored ventilation, respiratory function stable and patient connected to nasal cannula oxygen Cardiovascular status: blood pressure returned to baseline and stable Postop Assessment: no apparent nausea or vomiting Anesthetic complications: no   No notable events documented.  Last Vitals:  Vitals:   05/27/21 0700 05/27/21 1223  BP: 121/80 124/78  Pulse: 90 86  Resp: 16 17  Temp: 36.6 C 36.8 C  SpO2: 97% 96%    Last Pain:  Vitals:   05/27/21 1313  TempSrc:   PainSc: 8                  [name]

## 2021-05-27 NOTE — Progress Notes (Signed)
  Subjective: Patient stable.  Pain reasonably well controlled.   Objective: Vital signs in last 24 hours: Temp:  [96.3 F (35.7 C)-98.2 F (36.8 C)] 97.9 F (36.6 C) (07/23 0700) Pulse Rate:  [85-106] 90 (07/23 0700) Resp:  [7-22] 16 (07/23 0700) BP: (104-158)/(63-101) 121/80 (07/23 0700) SpO2:  [94 %-100 %] 97 % (07/23 0700)  Intake/Output from previous day: 07/22 0701 - 07/23 0700 In: 1000 [I.V.:1000] Out: 25 [Blood:25] Intake/Output this shift: No intake/output data recorded.  Exam:  Sensation intact distally Intact pulses distally  Labs: No results for input(s): HGB in the last 72 hours. No results for input(s): WBC, RBC, HCT, PLT in the last 72 hours. No results for input(s): NA, K, CL, CO2, BUN, CREATININE, GLUCOSE, CALCIUM in the last 72 hours. No results for input(s): LABPT, INR in the last 72 hours.  Assessment/Plan: Plan is 1 dose IV antibiotics today with discharge home on oral antibiotics.  Weightbearing as tolerated in knee immobilizer.  Please remove Ace wrap prior to discharge.  Follow-up 1 week from this coming Monday   Anderson Malta 05/27/2021, 7:44 AM

## 2021-05-27 NOTE — Progress Notes (Signed)
Provided discharge education/instructions, all questions and concerns addressed, Pt not in distress, RW delivered to room, due antibiotic given, ace wrap removed per MD's note. Pt discharged home with belongings accompanied by husband.

## 2021-05-27 NOTE — Op Note (Signed)
NAME: [name], [name] MEDICAL RECORD NO: [id number] ACCOUNT NO: [id number] DATE OF BIRTH: [birth year] FACILITY: [facility] LOCATION: [id number] PHYSICIAN: Yetta Barre. Marlou Sa, MD  Operative Report   DATE OF PROCEDURE: 05/26/2021  PREOPERATIVE DIAGNOSIS:  Left knee complex laceration with open inferior pole patellar fracture and possible intra-articular involvement.  POSTOPERATIVE DIAGNOSIS:  Left knee complex laceration with open inferior pole patellar fracture and possible intra-articular involvement.  PROCEDURE:  Left knee arthroscopic washout with irrigation and excisional debridement of skin, subcutaneous tissue, muscle, fascia and bone associated with open inferior pole patellar fracture and complex wound closure of 6 cm laceration, which was  layered and involved part of the patellar tendon.  SURGEON:  Meredith Pel, MD  ASSISTANT:  Annie Main, PA.  INDICATIONS:  The patient is a 50 year old patient with left knee laceration from chainsaw injury.  She presents for operative management after explanation of risks and benefits.  DESCRIPTION OF PROCEDURE:  The patient was brought to the operating room where general anesthetic was induced.  Perioperative IV antibiotics were continued.  Timeout was called.  Left leg was pre-scrubbed with Hibiclens and then prepped in a sterile  manner with DuraPrep.  The patient had about a 6 cm laceration over the inferior portion of the patella medially.  Skin edges were macerated and gross contamination was present.  The knee joint was first injected with saline, approximately 30 mL. No  obvious efflux through the laceration was observed, but the laceration did come very close to the joint itself.  At this time, layered debridement using knife forceps, curette was performed by debriding devitalized appearing skin, subcutaneous tissue,  fascia, muscle and then bone with a curette.  Devitalized tissue including part of the patellar tendon over about 1.5 cm area  was debrided.  Bone fragments were also debrided and removed sharply with a knife and forceps.  Following this, bony surface was  scraped with a curette.  Thorough irrigation with 3 liters of irrigating solution was performed.  At this time, anterior, superior, proximal anterolateral and proximal anteromedial portals were established and 3 liters of irrigating solution was  performed through the knee joint.  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  Following irrigation in the joint, the instruments were removed.  Portals were closed using 3-0  nylon.  Further irrigation performed on the laceration and vancomycin powder placed on the bone.  A 3-0 Vicryl sutures were used to reapproximate the tendon to the periosteal fascia at the end of the patella, primarily medially.  Next 2 Vicryl sutures  were used to reapproximate the subdermal tissue followed by 4 nylon sutures to reapproximate the skin in the loose fashion.  Vancomycin powder also placed in this layered closure.  Aquacel dressings placed.  It should be noted that prior to dressing  placement, 10 mL of Marcaine, morphine, clonidine solution injected into the knee and 20 mL was injected around the incision for postoperative pain relief.  The patient tolerated the procedure well without immediate complications.  Bulky dressing and  knee immobilizer applied.  She will be weightbearing as tolerated with aspirin for DVT prophylaxis.  One more dose of IV antibiotics and 3 days of p.o. antibiotics at home for the open fracture.   Luke's assistance was required at all times for retraction, opening, closing, mobilization of tissue.  His assistance was a medical necessity.   PAA D: 05/26/2021 10:21:39 pm T: 05/27/2021 2:43:00  am  JOB: [id number]

## 2021-05-28 ENCOUNTER — Encounter (HOSPITAL_COMMUNITY): Payer: Self-pay | Admitting: Orthopedic Surgery

## 2021-05-30 ENCOUNTER — Other Ambulatory Visit: Payer: Self-pay | Admitting: Surgical

## 2021-05-30 ENCOUNTER — Telehealth: Payer: Self-pay | Admitting: Orthopedic Surgery

## 2021-05-30 MED ORDER — HYDROCODONE-ACETAMINOPHEN 5-325 MG PO TABS: 1.0000 | ORAL_TABLET | ORAL | 0 refills | Status: DC | PRN

## 2021-05-30 MED ORDER — PROMETHAZINE HCL 12.5 MG PO TABS: 12.5000 mg | ORAL_TABLET | Freq: Four times a day (QID) | ORAL | 0 refills | Status: DC | PRN

## 2021-05-30 NOTE — Telephone Encounter (Signed)
Pt called requesting a call back from [place] did not disclose nature of her call. Please call pt at [phone].

## 2021-05-30 NOTE — Telephone Encounter (Signed)
Called patient. She stated she is in terrible pain and not sleeping at all. She tried taking 2 oxy but makes her very nauseated and sick. She stated if she does not get some sleep and have better control of her pain she is not sure what she will do. She is wanting to know if rx for sleep can be called in? Also asking for call from one of you to further discuss.

## 2021-05-31 NOTE — Telephone Encounter (Signed)
thx

## 2021-06-02 ENCOUNTER — Other Ambulatory Visit: Payer: Self-pay

## 2021-06-02 ENCOUNTER — Ambulatory Visit (INDEPENDENT_AMBULATORY_CARE_PROVIDER_SITE_OTHER): Payer: 59 | Admitting: Orthopedic Surgery

## 2021-06-02 ENCOUNTER — Encounter: Payer: Self-pay | Admitting: Orthopedic Surgery

## 2021-06-02 DIAGNOSIS — S81012D Laceration without foreign body, left knee, subsequent encounter: Secondary | ICD-10-CM

## 2021-06-02 NOTE — Progress Notes (Signed)
Post-Op Visit Note   Patient: [name]           Date of Birth: December 16, 1970           MRN: [id number] Visit Date: 06/02/2021 PCP: Wendie Agreste, MD   Assessment & Plan:  Chief Complaint:  Chief Complaint  Patient presents with   Left Knee - Routine Post Op   Visit Diagnoses:  1. Laceration of left knee, subsequent encounter     Plan: [name] is a 50 year old patient is a week out from complex knee laceration repair from chainsaw injury.  Knee washout also performed.  On exam there is no effusion in the knee.  Portal sutures are removed today.  Loosely close laceration incision looks intact.  No erythema or induration.  No calf tenderness.  Negative Homans.  Plan at this time is to have her start doing some knee range of motion exercises not to exceed 90 degrees of flexion.  Okay to continue weightbearing as tolerated with or without the knee immobilizer as she feels it is necessary.  She finished her antibiotics today.  We will see how the incision looks in 7 days off antibiotics.  We will likely remove the nylon sutures at that time.  Follow-Up Instructions: Return in about 1 week (around 06/09/2021).   Orders:  No orders of the defined types were placed in this encounter.  No orders of the defined types were placed in this encounter.   Imaging: No results found.  PMFS History: Patient Active Problem List   Diagnosis Date Noted   S/P left knee arthroscopy 05/27/2021   Laceration of left knee 05/26/2021   Mass of ureter s/p resection 05/17/2017 05/18/2017   Atrophic kidney s/p right nepherctomy 05/17/2017 05/18/2017   GERD (gastroesophageal reflux disease)    Complication of anesthesia    Arthritis    Gastric tumor s/p partial gastrectomy 05/17/2016 05/17/2017   Solitary pulmonary nodule 02/18/2017   Genetic testing 04/04/2016   Family history of colon cancer 03/14/2016   Family history of breast cancer in female 03/14/2016   Morbid obesity due to excess  calories ([place]) 01/18/2012   Anxiety state 04/20/2008   Cough variant asthma 04/20/2008   LIVER FUNCTION TESTS, ABNORMAL 04/20/2008   Attention deficit hyperactivity disorder (ADHD) 06/10/2007   Past Medical History:  Diagnosis Date   Arthritis    Asthma    Complication of anesthesia    respirations slow down after coming out from Anesthesia   Endometriosis    GERD (gastroesophageal reflux disease)     Family History  Problem Relation Age of Onset   Hypertension Mother    Cancer Mother 67       "back cancer"   Other Mother        hysterectomy at 31 for "pre-cancerous cervical cells"   Hypertension Father    Colon cancer Father 41   Cervical cancer Sister        full sister dx. unspecified age   Colon polyps Sister        unspecified number   Breast cancer Maternal Grandmother        unspecified age   Diabetes Maternal Grandmother    Diabetes Paternal Grandfather    Colon cancer Paternal Aunt        (x2) paternal aunts with colon cancer at unspecified ages; lim info   Colon cancer Paternal Uncle        dx. unspecified age; lim info   Colon polyps Sister  paternal half-sister w/ colon polyps dx. age 52 or younger - unspecified number   Colon cancer Sister 7       paternal half-sister    Colon polyps Sister        paternal half-sister; unspecified number   Colon cancer Sister        paternal half-sister dx. before age 54; has had two colon cancers   Breast cancer Sister        paternal half-sister dx under age 37   Renal cancer Sister 38       paternal half-sister dx. w/ "LRC - lymphoid renal cancer"   Cancer Sister 51       paternal half-sister dx cancer of wrist that was not a skin cancer   Colon polyps Son 40       has had a "bleeding polyp"   Cervical cancer Other 85       niece; +hysterectomy   Breast cancer Paternal Aunt        (x3) paternal aunts with breast cancer at unspecified ages; lim info    Past Surgical History:  Procedure Laterality Date    ABDOMINAL HYSTERECTOMY     CERVICAL DISC SURGERY     CHOLECYSTECTOMY     EUS N/A 03/14/2017   Procedure: UPPER ENDOSCOPIC ULTRASOUND (EUS) LINEAR;  Surgeon: Milus Banister, MD;  Location: [facility];  Service: Endoscopy;  Laterality: N/A;   I & D EXTREMITY Left 05/26/2021   Procedure: IRRIGATION AND DEBRIDEMENT EXTREMITY WITH COMPLEX CLOSURE OF WOUND;  Surgeon: Meredith Pel, MD;  Location: [facility];  Service: Orthopedics;  Laterality: Left;   KNEE ARTHROSCOPY Left 05/26/2021   Procedure: ARTHROSCOPIC KNEE WASHOUT;  Surgeon: Meredith Pel, MD;  Location: [facility];  Service: Orthopedics;  Laterality: Left;   LAPAROSCOPIC ABDOMINAL EXPLORATION N/A 05/17/2017   Procedure: LAPAROSCOPIC POSSIBLE OPEN REMOVAL OF STOMACH MASS;  Surgeon: Jackolyn Confer, MD;  Location: [facility];  Service: General;  Laterality: N/A;   LAPAROSCOPIC NEPHRECTOMY Right 05/17/2017   Procedure: RIGHT RADICAL NEPHRECTOMY, URETERECTOMY;  Surgeon: Ardis Hughs, MD;  Location: [facility];  Service: Urology;  Laterality: Right;   tubal preganancy     Social History   Occupational History   Not on file  Tobacco Use   Smoking status: Never   Smokeless tobacco: Never  Vaping Use   Vaping Use: Never used  Substance and Sexual Activity   Alcohol use: No   Drug use: No   Sexual activity: Yes    Birth control/protection: Surgical

## 2021-06-03 DIAGNOSIS — S82002B Unspecified fracture of left patella, initial encounter for open fracture type I or II: Secondary | ICD-10-CM

## 2021-06-03 DIAGNOSIS — S81002A Unspecified open wound, left knee, initial encounter: Secondary | ICD-10-CM

## 2021-06-03 DIAGNOSIS — S81012A Laceration without foreign body, left knee, initial encounter: Secondary | ICD-10-CM

## 2021-06-07 ENCOUNTER — Encounter: Payer: Self-pay | Admitting: Orthopedic Surgery

## 2021-06-07 ENCOUNTER — Other Ambulatory Visit: Payer: Self-pay

## 2021-06-07 ENCOUNTER — Ambulatory Visit (INDEPENDENT_AMBULATORY_CARE_PROVIDER_SITE_OTHER): Payer: 59 | Admitting: Orthopedic Surgery

## 2021-06-07 DIAGNOSIS — S81012D Laceration without foreign body, left knee, subsequent encounter: Secondary | ICD-10-CM

## 2021-06-07 MED ORDER — OXYCODONE-ACETAMINOPHEN 5-325 MG PO TABS: 1.0000 | ORAL_TABLET | Freq: Every evening | ORAL | 0 refills | Status: DC | PRN

## 2021-06-07 NOTE — Progress Notes (Signed)
Post-Op Visit Note   Patient: [name]           Date of Birth: 05-Jan-1971           MRN: [id number] Visit Date: 06/07/2021 PCP: Wendie Agreste, MD   Assessment & Plan:  Chief Complaint:  Chief Complaint  Patient presents with   Left Knee - Follow-up   Visit Diagnoses:  1. Laceration of left knee, subsequent encounter     Plan: Patient is a 50 year old female who presents s/p left knee arthroscopic irrigation and debridement on 05/26/2021 with complex wound closure of chainsaw laceration.  Patient is ambulating with a walker and immobilizer.  She is doing straight leg raises exercises.  She complains of a lot of swelling yesterday but not so bad today.  Denies any fevers, chills, night sweats or drainage from the incision.  Wound looks to be healing well with no evidence of infection or dehiscence.  No knee effusion.  No calf tenderness.  Able to perform straight leg raise with excellent quadricep strength.  No extensor lag.  Sutures removed and replaced with Steri-Strips.  Plan for her to avoid stairs but okay for flexion to 90 degrees.  She will follow-up in 3 weeks for clinical recheck with Dr. Marlou Sa.  1 last prescription of oxycodone nightly as needed given today but after that she will go to over-the-counter pain medication.  Recommended she call the office if she develops any fevers, chills, night sweats, drainage, other signs of infection.  Follow-Up Instructions: No follow-ups on file.   Orders:  No orders of the defined types were placed in this encounter.  Meds ordered this encounter  Medications   oxyCODONE-acetaminophen (PERCOCET/ROXICET) 5-325 MG tablet    Sig: Take 1 tablet by mouth at bedtime as needed for severe pain.    Dispense:  15 tablet    Refill:  0    Imaging: No results found.  PMFS History: Patient Active Problem List   Diagnosis Date Noted   Open fracture of left patella    Laceration of left knee with complication    Open wound  of left knee    S/P left knee arthroscopy 05/27/2021   Laceration of left knee 05/26/2021   Mass of ureter s/p resection 05/17/2017 05/18/2017   Atrophic kidney s/p right nepherctomy 05/17/2017 05/18/2017   GERD (gastroesophageal reflux disease)    Complication of anesthesia    Arthritis    Gastric tumor s/p partial gastrectomy 05/17/2016 05/17/2017   Solitary pulmonary nodule 02/18/2017   Genetic testing 04/04/2016   Family history of colon cancer 03/14/2016   Family history of breast cancer in female 03/14/2016   Morbid obesity due to excess calories ([place]) 01/18/2012   Anxiety state 04/20/2008   Cough variant asthma 04/20/2008   LIVER FUNCTION TESTS, ABNORMAL 04/20/2008   Attention deficit hyperactivity disorder (ADHD) 06/10/2007   Past Medical History:  Diagnosis Date   Arthritis    Asthma    Complication of anesthesia    respirations slow down after coming out from Anesthesia   Endometriosis    GERD (gastroesophageal reflux disease)     Family History  Problem Relation Age of Onset   Hypertension Mother    Cancer Mother 67       "back cancer"   Other Mother        hysterectomy at 87 for "pre-cancerous cervical cells"   Hypertension Father    Colon cancer Father 45   Cervical cancer Sister  full sister dx. unspecified age   Colon polyps Sister        unspecified number   Breast cancer Maternal Grandmother        unspecified age   Diabetes Maternal Grandmother    Diabetes Paternal Grandfather    Colon cancer Paternal Aunt        (x2) paternal aunts with colon cancer at unspecified ages; lim info   Colon cancer Paternal Uncle        dx. unspecified age; lim info   Colon polyps Sister        paternal half-sister w/ colon polyps dx. age 3 or younger - unspecified number   Colon cancer Sister 7       paternal half-sister    Colon polyps Sister        paternal half-sister; unspecified number   Colon cancer Sister        paternal half-sister dx. before age 25;  has had two colon cancers   Breast cancer Sister        paternal half-sister dx under age 45   Renal cancer Sister 6       paternal half-sister dx. w/ "LRC - lymphoid renal cancer"   Cancer Sister 26       paternal half-sister dx cancer of wrist that was not a skin cancer   Colon polyps Son 57       has had a "bleeding polyp"   Cervical cancer Other 2       niece; +hysterectomy   Breast cancer Paternal Aunt        (x3) paternal aunts with breast cancer at unspecified ages; lim info    Past Surgical History:  Procedure Laterality Date   ABDOMINAL HYSTERECTOMY     CERVICAL DISC SURGERY     CHOLECYSTECTOMY     EUS N/A 03/14/2017   Procedure: UPPER ENDOSCOPIC ULTRASOUND (EUS) LINEAR;  Surgeon: Milus Banister, MD;  [facility];  Service: Endoscopy;  Laterality: N/A;   I & D EXTREMITY Left 05/26/2021   Procedure: IRRIGATION AND DEBRIDEMENT EXTREMITY WITH COMPLEX CLOSURE OF WOUND;  Surgeon: Meredith Pel, MD;  Location: [facility];  Service: Orthopedics;  Laterality: Left;   KNEE ARTHROSCOPY Left 05/26/2021   Procedure: ARTHROSCOPIC KNEE WASHOUT;  Surgeon: Meredith Pel, MD;  Location: [facility];  Service: Orthopedics;  Laterality: Left;   LAPAROSCOPIC ABDOMINAL EXPLORATION N/A 05/17/2017   Procedure: LAPAROSCOPIC POSSIBLE OPEN REMOVAL OF STOMACH MASS;  Surgeon: Jackolyn Confer, MD;  [facility];  Service: General;  Laterality: N/A;   LAPAROSCOPIC NEPHRECTOMY Right 05/17/2017   Procedure: RIGHT RADICAL NEPHRECTOMY, URETERECTOMY;  Surgeon: Ardis Hughs, MD;  [facility];  Service: Urology;  Laterality: Right;   tubal preganancy     Social History   Occupational History   Not on file  Tobacco Use   Smoking status: Never   Smokeless tobacco: Never  Vaping Use   Vaping Use: Never used  Substance and Sexual Activity   Alcohol use: No   Drug use: No   Sexual activity: Yes    Birth control/protection: Surgical

## 2021-06-08 NOTE — Discharge Summary (Signed)
Physician Discharge Summary      Patient ID: [name] MRN: [id number] DOB/AGE: 1971-04-15 50 y.o.  Admit date: 05/26/2021 Discharge date: 05/27/2021  Admission Diagnoses:  Active Problems:   Laceration of left knee   S/P left knee arthroscopy   Open fracture of left patella   Laceration of left knee with complication   Open wound of left knee   Discharge Diagnoses:  Same  Surgeries: Procedure(s): ARTHROSCOPIC KNEE WASHOUT IRRIGATION AND DEBRIDEMENT EXTREMITY WITH COMPLEX CLOSURE OF WOUND on 05/26/2021   Consultants:   Discharged Condition: Stable  Hospital Course: [name] is an 50 y.o. female who was admitted 05/26/2021 with a chief complaint of left knee pain, and found to have a diagnosis of left knee chainsaw laceration.  They were brought to the operating room on 05/26/2021 and underwent the above named procedures.  Pt awoke from anesthesia without complication and was transferred to the floor. On POD1, patient's pain was overall controlled and she was able to mobilize well with therapy.  She was discharged home on POD 1.  Pt will f/u with Dr. Marlou Sa in clinic in ~1 weeks.   Antibiotics given:  Anti-infectives (From admission, onward)    Start     Dose/Rate Route Frequency Ordered Stop   05/27/21 1600  levofloxacin (LEVAQUIN) IVPB 750 mg        750 mg 100 mL/hr over 90 Minutes Intravenous  Once 05/26/21 2218 05/27/21 1441   05/27/21 0000  sulfamethoxazole-trimethoprim (BACTRIM DS) 800-160 MG tablet  Status:  Discontinued        1 tablet Oral 2 times daily 05/27/21 0750 05/27/21    05/27/21 0000  sulfamethoxazole-trimethoprim (BACTRIM DS) 800-160 MG tablet        1 tablet Oral 2 times daily 05/27/21 1432     05/26/21 2139  vancomycin (VANCOCIN) powder  Status:  Discontinued          As needed 05/26/21 2139 05/26/21 2234   05/26/21 1600  levofloxacin (LEVAQUIN) IVPB 750 mg        750 mg 100 mL/hr over 90 Minutes Intravenous  Once 05/26/21 1555  05/26/21 1755     .  Recent vital signs:  Vitals:   05/27/21 0700 05/27/21 1223  BP: 121/80 124/78  Pulse: 90 86  Resp: 16 17  Temp: 97.9 F (36.6 C) 98.2 F (36.8 C)  SpO2: 97% 96%    Recent laboratory studies:  Results for orders placed or performed during the hospital encounter of 05/26/21  Resp Panel by RT-PCR (Flu A&B, Covid) Nasopharyngeal Swab   Specimen: Nasopharyngeal Swab; Nasopharyngeal(NP) swabs in vial transport medium  Result Value Ref Range   SARS Coronavirus 2 by RT PCR NEGATIVE NEGATIVE   Influenza A by PCR NEGATIVE NEGATIVE   Influenza B by PCR NEGATIVE NEGATIVE    Discharge Medications:   Allergies as of 05/27/2021       Reactions   Amoxicillin Anaphylaxis   All "cillins" pt was 2-58 years old  Has patient had a PCN reaction causing immediate rash, facial/tongue/throat swelling, SOB or lightheadedness with hypotension: yes Has patient had a PCN reaction causing severe rash involving mucus membranes or skin necrosis: unknown Has patient had a PCN reaction that required hospitalization yes Has patient had a PCN reaction occurring within the last 10 years:no If all of the above answers are "NO", then may proceed with Cephalosporin use.   Keflex [cephalexin] Anaphylaxis   Penicillins Anaphylaxis   Has patient had a PCN reaction  causing immediate rash, facial/tongue/throat swelling, SOB or lightheadedness with hypotension:yes Has patient had a PCN reaction causing severe rash involving mucus membranes or skin necrosis: unknown Has patient had a PCN reaction that required hospitalization yes  Has patient had a PCN reaction occurring within the last 10 years: no If all of the above answers are "NO", then may proceed with Cephalosporin use.   Baby Powder [methylbenzethonium] Hives   Ibuprofen Hives   Doxycycline Diarrhea, Rash        Medication List     STOP taking these medications    APPLE CIDER VINEGAR PO   OVER THE COUNTER MEDICATION    phentermine 37.5 MG tablet Commonly known as: ADIPEX-P       TAKE these medications    docusate sodium 100 MG capsule Commonly known as: COLACE Take 1 capsule (100 mg total) by mouth 2 (two) times daily.   HAIR SKIN & NAILS GUMMIES PO Take 2 tablets by mouth daily. Notes to patient: Resume home regimen   methocarbamol 500 MG tablet Commonly known as: ROBAXIN Take 1 tablet (500 mg total) by mouth every 8 (eight) hours as needed for muscle spasms.   Multivitamin Gummies Adult Chew Chew 2 tablets by mouth daily. Notes to patient: Resume home regimen   ondansetron 4 MG tablet Commonly known as: ZOFRAN Take 1 tablet (4 mg total) by mouth every 8 (eight) hours as needed for nausea. Notes to patient: Last dose given 07/23 12:25pm   pantoprazole 40 MG tablet Commonly known as: PROTONIX TAKE ONE TABLET BY MOUTH TWICE DAILY   Proventil HFA 108 (90 Base) MCG/ACT inhaler Generic drug: albuterol Inhale 2 puffs into the lungs every 6 (six) hours as needed for wheezing or shortness of breath.   sulfamethoxazole-trimethoprim 800-160 MG tablet Commonly known as: BACTRIM DS Take 1 tablet by mouth 2 (two) times daily.   VITAMIN B12 PO Take 2 tablets by mouth daily. Notes to patient: Resume home regimen   VITAMIN B-12 IJ Inject 1 each into the muscle every Thursday. Notes to patient: Resume home regimen   VITAMIN D PO Take 2 tablets by mouth daily. Notes to patient: Resume home regimen        Diagnostic Studies: CT Knee Left Wo Contrast  Result Date: 05/26/2021 CLINICAL DATA:  Knee trauma, penetrating r/o open joint knee vs chainsaw, injured by change on this afternoon EXAM: CT OF THE left KNEE WITHOUT CONTRAST TECHNIQUE: Multidetector CT imaging of the left knee was performed according to the standard protocol. Multiplanar CT image reconstructions were also generated. COMPARISON:  None. FINDINGS: Bones/Joint/Cartilage There is a fracture of the anterior medial inferior  patella with multiple adjacent bony fragments. There is intra-articular gas noted suggestive of arthrotomy. Trace joint fluid. Ligaments Suboptimally assessed by CT. The intra-articular gas, soft tissue laceration, patellar fracture are adjacent to the patellar attachment of the medial retinaculum, which demonstrates periligamentous edema. Muscles and Tendons No muscle atrophy. The patellar tendon appears intact. The quadriceps tendon appears intact. Soft tissues Mild adjacent soft tissue swelling. IMPRESSION: Soft tissue laceration to the anterior inferior knee with underlying chip fracture of the anteromedial inferior patella with multiple small adjacent bony fragments. There is intra-articular gas suggestive of traumatic arthrotomy. The laceration and intra-articular gas is in close proximity to the patellar attachment of the medial retinaculum, which could be injured, suboptimally assessed on CT. Electronically Signed   By: Maurine Simmering   On: 05/26/2021 17:14    Disposition: Discharge disposition: 01-Home or Self Care  Discharge Instructions     Call MD / Call 911   Complete by: As directed    If you experience chest pain or shortness of breath, CALL 911 and be transported to the hospital emergency room.  If you develope a fever above 101 F, pus (white drainage) or increased drainage or redness at the wound, or calf pain, call your surgeon's office.   Constipation Prevention   Complete by: As directed    Drink plenty of fluids.  Prune juice may be helpful.  You may use a stool softener, such as Colace (over the counter) 100 mg twice a day.  Use MiraLax (over the counter) for constipation as needed.   Diet - low sodium heart healthy   Complete by: As directed    Discharge instructions   Complete by: As directed    Weightbearing as tolerated with knee immobilizer and leg straight  Okay to shower dressing is waterproof  Return to clinic 1 week from this coming Monday   Increase  activity slowly as tolerated   Complete by: As directed    Post-operative opioid taper instructions:   Complete by: As directed    POST-OPERATIVE OPIOID TAPER INSTRUCTIONS: It is important to wean off of your opioid medication as soon as possible. If you do not need pain medication after your surgery it is ok to stop day one. Opioids include: Codeine, Hydrocodone(Norco, Vicodin), Oxycodone(Percocet, oxycontin) and hydromorphone amongst others.  Long term and even short term use of opiods can cause: Increased pain response Dependence Constipation Depression Respiratory depression And more.  Withdrawal symptoms can include Flu like symptoms Nausea, vomiting And more Techniques to manage these symptoms Hydrate well Eat regular healthy meals Stay active Use relaxation techniques(deep breathing, meditating, yoga) Do Not substitute Alcohol to help with tapering If you have been on opioids for less than two weeks and do not have pain than it is ok to stop all together.  Plan to wean off of opioids This plan should start within one week post op of your joint replacement. Maintain the same interval or time between taking each dose and first decrease the dose.  Cut the total daily intake of opioids by one tablet each day Next start to increase the time between doses. The last dose that should be eliminated is the evening dose.             Signed: Donella Stade 06/08/2021, 10:53 AM

## 2021-06-09 ENCOUNTER — Encounter: Payer: 59 | Admitting: Surgical

## 2021-06-12 ENCOUNTER — Ambulatory Visit: Payer: 59 | Admitting: Family Medicine

## 2021-06-28 ENCOUNTER — Ambulatory Visit (INDEPENDENT_AMBULATORY_CARE_PROVIDER_SITE_OTHER): Payer: 59 | Admitting: Orthopedic Surgery

## 2021-06-28 ENCOUNTER — Encounter: Payer: Self-pay | Admitting: Orthopedic Surgery

## 2021-06-28 ENCOUNTER — Other Ambulatory Visit: Payer: Self-pay

## 2021-06-28 DIAGNOSIS — S81012D Laceration without foreign body, left knee, subsequent encounter: Secondary | ICD-10-CM

## 2021-06-28 NOTE — Progress Notes (Signed)
Post-Op Visit Note   Patient: [name]           Date of Birth: February 10, 1971           MRN: [id number] Visit Date: 06/28/2021 PCP: Wendie Agreste, MD   Assessment & Plan:  Chief Complaint:  Chief Complaint  Patient presents with   Other    left knee arthroscopic irrigation and debridement on 05/26/2021 with complex wound closure of chainsaw laceration   Visit Diagnoses:  1. Laceration of left knee, subsequent encounter     Plan: [name] is a 50 year old patient who is now a month out left knee washout and I&D and complex closure of wound from left knee laceration.  She also had a partial tendon repair which was about 5 to 10% of that tendon involvement.  She has been doing well.  On exam she has excellent range of motion of the left knee with no effusion.  She still has some swelling in that left leg with negative Homans.  Pedal pulses palpable.  One 1 mm x 3 mm area that has not fully closed but it looks more like an area of granulation tissue as opposed to any type of sinus or fistula.  Counseled her against going into the ocean which she wants to do next week.  I think since she is going to do it anyway we gave her some Tegaderm and benzoin to use.  Come back in 3 weeks to see [facility] for final check.  Okay to start doing gym workouts after she gets back from her week at the beach next week.  Follow-Up Instructions: Return in about 3 weeks (around 07/19/2021).   Orders:  No orders of the defined types were placed in this encounter.  No orders of the defined types were placed in this encounter.   Imaging: No results found.  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/17/2017 05/18/2017   Atrophic kidney s/p right nepherctomy  05/17/2017 05/18/2017   GERD (gastroesophageal reflux disease)    Complication of anesthesia    Arthritis    Gastric tumor s/p partial gastrectomy 05/17/2016 05/17/2017   Solitary pulmonary nodule 02/18/2017   Genetic testing 04/04/2016   Family history of colon cancer 03/14/2016   Family history of breast cancer in female 03/14/2016   Morbid obesity due to excess calories ([place]) 01/18/2012   Anxiety state 04/20/2008   Cough variant asthma 04/20/2008   LIVER FUNCTION TESTS, ABNORMAL 04/20/2008   Attention deficit hyperactivity disorder (ADHD) 06/10/2007   Past Medical History:  Diagnosis Date   Arthritis    Asthma    Complication of anesthesia    respirations slow down after coming out from Anesthesia   Endometriosis    GERD (gastroesophageal reflux disease)     Family History  Problem Relation Age of Onset   Hypertension Mother    Cancer Mother 85       "back cancer"   Other Mother        hysterectomy at 24 for "pre-cancerous cervical cells"   Hypertension Father    Colon cancer Father 63   Cervical cancer Sister        full sister dx. unspecified age  Colon polyps Sister        unspecified number   Breast cancer Maternal Grandmother        unspecified age   Diabetes Maternal Grandmother    Diabetes Paternal Grandfather    Colon cancer Paternal Aunt        (x2) paternal aunts with colon cancer at unspecified ages; lim info   Colon cancer Paternal Uncle        dx. unspecified age; lim info   Colon polyps Sister        paternal half-sister w/ colon polyps dx. age 37 or younger - unspecified number   Colon cancer Sister 7       paternal half-sister    Colon polyps Sister        paternal half-sister; unspecified number   Colon cancer Sister        paternal half-sister dx. before age 66; has had two colon cancers   Breast cancer Sister        paternal half-sister dx under age 22   Renal cancer Sister 90       paternal half-sister dx. w/ "[facility] - lymphoid renal cancer"    Cancer Sister 44       paternal half-sister dx cancer of wrist that was not a skin cancer   Colon polyps Son 13       has had a "bleeding polyp"   Cervical cancer Other 12       niece; +hysterectomy   Breast cancer Paternal Aunt        (x3) paternal aunts with breast cancer at unspecified ages; lim info    Past Surgical History:  Procedure Laterality Date   ABDOMINAL HYSTERECTOMY     CERVICAL DISC SURGERY     CHOLECYSTECTOMY     EUS N/A 03/14/2017   Procedure: UPPER ENDOSCOPIC ULTRASOUND (EUS) LINEAR;  Surgeon: Milus Banister, MD;  Location: [facility];  Service: Endoscopy;  Laterality: N/A;   I & D EXTREMITY Left 05/26/2021   Procedure: IRRIGATION AND DEBRIDEMENT EXTREMITY WITH COMPLEX CLOSURE OF WOUND;  Surgeon: Meredith Pel, MD;  Location: [place];  Service: Orthopedics;  Laterality: Left;   KNEE ARTHROSCOPY Left 05/26/2021   Procedure: ARTHROSCOPIC KNEE WASHOUT;  Surgeon: Meredith Pel, MD;  Location: [place];  Service: Orthopedics;  Laterality: Left;   LAPAROSCOPIC ABDOMINAL EXPLORATION N/A 05/17/2017   Procedure: LAPAROSCOPIC POSSIBLE OPEN REMOVAL OF STOMACH MASS;  Surgeon: Jackolyn Confer, MD;  Location: [facility];  Service: General;  Laterality: N/A;   LAPAROSCOPIC NEPHRECTOMY Right 05/17/2017   Procedure: RIGHT RADICAL NEPHRECTOMY, URETERECTOMY;  Surgeon: Ardis Hughs, MD;  Location: [facility];  Service: Urology;  Laterality: Right;   tubal preganancy     Social History   Occupational History   Not on file  Tobacco Use   Smoking status: Never   Smokeless tobacco: Never  Vaping Use   Vaping Use: Never used  Substance and Sexual Activity   Alcohol use: No   Drug use: No   Sexual activity: Yes    Birth control/protection: Surgical

## 2021-07-18 ENCOUNTER — Telehealth: Payer: Self-pay | Admitting: Family Medicine

## 2021-07-18 MED ORDER — ALBUTEROL SULFATE HFA 108 (90 BASE) MCG/ACT IN AERS: 2.0000 | INHALATION_SPRAY | Freq: Four times a day (QID) | RESPIRATORY_TRACT | 0 refills | Status: DC | PRN

## 2021-07-18 NOTE — Telephone Encounter (Signed)
Called and spoke with patient and patient stated that she uses her albuterol on an as needed bases and she is out right now. She had an appt with you in August but had a knee accident with a chain saw so she had to reschedule to 09/21/21. Her previous provider has now retired so she is unable to get it refilled. Is it ok to refill? Please advise

## 2021-07-18 NOTE — Telephone Encounter (Signed)
Refilled temporarily - must keep scheduled appointment.

## 2021-07-18 NOTE — Telephone Encounter (Signed)
Patient needs her albuterol inhaler called in - to [facility] - Patient has new patient appointment with Dr. Carlota Raspberry on 09/21/2021 and her other doctor has retired.

## 2021-07-20 ENCOUNTER — Other Ambulatory Visit: Payer: Self-pay

## 2021-07-20 ENCOUNTER — Encounter: Payer: Self-pay | Admitting: Gastroenterology

## 2021-07-20 ENCOUNTER — Telehealth: Payer: Self-pay

## 2021-07-20 DIAGNOSIS — J45991 Cough variant asthma: Secondary | ICD-10-CM

## 2021-07-20 MED ORDER — ALBUTEROL SULFATE HFA 108 (90 BASE) MCG/ACT IN AERS: 2.0000 | INHALATION_SPRAY | Freq: Four times a day (QID) | RESPIRATORY_TRACT | 0 refills | Status: DC | PRN

## 2021-07-20 NOTE — Telephone Encounter (Signed)
Patient chart is updated with correct pharmacy and Rx has been resent.

## 2021-07-20 NOTE — Telephone Encounter (Signed)
Medication was sent to the wrong pharmacy all medications are to be sent to  [facility] on [address] and NOT [facility] on [address]   Pt meds was albuterol (PROVENTIL HFA) 108 (90 Base) MCG/ACT needs to be resent to [facility]   Pt wants all medications update to [facility]   Pt call back [phone]

## 2021-08-28 ENCOUNTER — Other Ambulatory Visit: Payer: Self-pay | Admitting: Family Medicine

## 2021-08-28 DIAGNOSIS — Z1231 Encounter for screening mammogram for malignant neoplasm of breast: Secondary | ICD-10-CM

## 2021-09-04 ENCOUNTER — Ambulatory Visit: Payer: Self-pay

## 2021-09-04 ENCOUNTER — Other Ambulatory Visit: Payer: Self-pay

## 2021-09-04 ENCOUNTER — Ambulatory Visit (INDEPENDENT_AMBULATORY_CARE_PROVIDER_SITE_OTHER): Payer: 59 | Admitting: Surgical

## 2021-09-04 ENCOUNTER — Encounter: Payer: Self-pay | Admitting: Orthopedic Surgery

## 2021-09-04 DIAGNOSIS — M25551 Pain in right hip: Secondary | ICD-10-CM | POA: Diagnosis not present

## 2021-09-04 NOTE — Progress Notes (Signed)
Office Visit Note   Patient: [name]           Date of Birth: 08/19/1971           MRN: [id number] Visit Date: 09/04/2021 Requested by: Wendie Agreste, MD [address] [place],  [place] [address] PCP: Wendie Agreste, MD  Subjective: Chief Complaint  Patient presents with   Right Hip - Pain    HPI: [name] is a 50 y.o. female who presents to the office complaining of right hip pain.  Patient complains of right hip pain that has been ongoing for the last 3 months.  Began after using brace and walker for her prior left knee injury sustained in July 2022.  She localizes pain to the anterior lateral groin and lateral aspect of the right hip.  Denies any low back pain, radicular pain, buttock pain, numbness/tingling.  She states pain has been steadily worsening over the last several months.  No history of prior hip surgery.  She does have history of leg length discrepancy that she has known about for her entire life but has not really caused her any issues.  She wakes with pain every night due to the right hip pain.  She has tried taking Tylenol without relief.  She is allergic to all NSAIDs having had anaphylactic reactions in the past requiring EpiPen.  She has had no prior injection.  She does note a catching sensation after she is sitting for long period of time but as she walks the sensation resolves.  She is able to run the running causes her increased right hip pain.  Left knee is doing well from prior injury.  Only complaint with the left knee she cannot kneel on it yet..                ROS: All systems reviewed are negative as they relate to the chief complaint within the history of present illness.  Patient denies fevers or chills.  Assessment & Plan: Visit Diagnoses:  1. Pain in right hip     Plan: Patient is a 50 year old female who presents for evaluation of right hip pain.  She has had right hip pain has been worsening over the last several  months.  No history of previous issue with right hip pain.  She does have history of leg length discrepancy.  She has tenderness over the greater trochanter as well as pain with hip range of motion.  She has minimal degenerative changes of the right hip joint on radiographs today with a small cam deformity.  Discussed options available to patient.  She would like to return in 2 weeks for a right intra-articular hip injection.  Also plan to have her try physical therapist directed home exercise program for right hip range of motion and strengthening exercises.  Referral submitted.  Follow-up in 2 weeks for injection.  If no improvement from this injection, could consider MRI of the right hip for further evaluation.  Follow-Up Instructions: Return in about 2 weeks (around 09/18/2021).   Orders:  Orders Placed This Encounter  Procedures   XR HIP UNILAT W OR W/O PELVIS 2-3 VIEWS RIGHT   Ambulatory referral to Physical Therapy   No orders of the defined types were placed in this encounter.     Procedures: No procedures performed   Clinical Data: No additional findings.  Objective: Vital Signs: There were no vitals taken for this visit.  Physical Exam:  Constitutional:  Patient appears well-developed HEENT:  Head: Normocephalic Eyes:EOM are normal Neck: Normal range of motion Cardiovascular: Normal rate Pulmonary/chest: Effort normal Neurologic: Patient is alert Skin: Skin is warm Psychiatric: Patient has normal mood and affect  Ortho Exam: Ortho exam demonstrates right hip with positive Stinchfield sign.  Equivalent hip flexion and internal rotation passively compared with the contralateral hip.  Mild pain with internal rotation.  Negative straight leg raise.  No calf tenderness.  Excellent hip AB duction and hip flexion strength rated 5/5.  No weakness of quadricep, hamstring, dorsiflexion, plantarflexion.  Mild to moderate tenderness over the right greater trochanter.  Specialty  Comments:  No specialty comments available.  Imaging: No results found.   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/17/2017 05/18/2017   Atrophic kidney s/p right nepherctomy 05/17/2017 05/18/2017   GERD (gastroesophageal reflux disease)    Complication of anesthesia    Arthritis    Gastric tumor s/p partial gastrectomy 05/17/2016 05/17/2017   Solitary pulmonary nodule 02/18/2017   Genetic testing 04/04/2016   Family history of colon cancer 03/14/2016   Family history of breast cancer in female 03/14/2016   Morbid obesity due to excess calories ([place]) 01/18/2012   Anxiety state 04/20/2008   Cough variant asthma 04/20/2008   LIVER FUNCTION TESTS, ABNORMAL 04/20/2008   Attention deficit hyperactivity disorder (ADHD) 06/10/2007   Past Medical History:  Diagnosis Date   Arthritis    Asthma    Complication of anesthesia    respirations slow down after coming out from Anesthesia   Endometriosis    GERD (gastroesophageal reflux disease)     Family History  Problem Relation Age of Onset   Hypertension Mother    Cancer Mother 63       "back cancer"   Other Mother        hysterectomy at 86 for "pre-cancerous cervical cells"   Hypertension Father    Colon cancer Father 55   Cervical cancer Sister        full sister dx. unspecified age   Colon polyps Sister        unspecified number   Breast cancer Maternal Grandmother        unspecified age   Diabetes Maternal Grandmother    Diabetes Paternal Grandfather    Colon cancer Paternal Aunt        (x2) paternal aunts with colon cancer at unspecified ages; lim info   Colon cancer Paternal Uncle        dx. unspecified age; lim info   Colon polyps Sister        paternal half-sister w/ colon  polyps dx. age 74 or younger - unspecified number   Colon cancer Sister 7       paternal half-sister    Colon polyps Sister        paternal half-sister; unspecified number   Colon cancer Sister        paternal half-sister dx. before age 18; has had two colon cancers   Breast cancer Sister        paternal half-sister dx under age 29   Renal cancer Sister 85       paternal half-sister dx. w/ "[facility] - lymphoid renal cancer"  Cancer Sister 25       paternal half-sister dx cancer of wrist that was not a skin cancer   Colon polyps Son 53       has had a "bleeding polyp"   Cervical cancer Other 40       niece; +hysterectomy   Breast cancer Paternal Aunt        (x3) paternal aunts with breast cancer at unspecified ages; lim info    Past Surgical History:  Procedure Laterality Date   ABDOMINAL HYSTERECTOMY     CERVICAL DISC SURGERY     CHOLECYSTECTOMY     EUS N/A 03/14/2017   Procedure: UPPER ENDOSCOPIC ULTRASOUND (EUS) LINEAR;  Surgeon: Milus Banister, MD;  Location: [facility];  Service: Endoscopy;  Laterality: N/A;   I & D EXTREMITY Left 05/26/2021   Procedure: IRRIGATION AND DEBRIDEMENT EXTREMITY WITH COMPLEX CLOSURE OF WOUND;  Surgeon: Meredith Pel, MD;  Location: [place];  Service: Orthopedics;  Laterality: Left;   KNEE ARTHROSCOPY Left 05/26/2021   Procedure: ARTHROSCOPIC KNEE WASHOUT;  Surgeon: Meredith Pel, MD;  Location: [place];  Service: Orthopedics;  Laterality: Left;   LAPAROSCOPIC ABDOMINAL EXPLORATION N/A 05/17/2017   Procedure: LAPAROSCOPIC POSSIBLE OPEN REMOVAL OF STOMACH MASS;  Surgeon: Jackolyn Confer, MD;  Location: [facility];  Service: General;  Laterality: N/A;   LAPAROSCOPIC NEPHRECTOMY Right 05/17/2017   Procedure: RIGHT RADICAL NEPHRECTOMY, URETERECTOMY;  Surgeon: Ardis Hughs, MD;  [facility];  Service: Urology;  Laterality: Right;   tubal preganancy     Social History   Occupational History   Not on file  Tobacco Use   Smoking status:  Never   Smokeless tobacco: Never  Vaping Use   Vaping Use: Never used  Substance and Sexual Activity   Alcohol use: No   Drug use: No   Sexual activity: Yes    Birth control/protection: Surgical

## 2021-09-06 ENCOUNTER — Other Ambulatory Visit: Payer: Self-pay

## 2021-09-06 ENCOUNTER — Ambulatory Visit (AMBULATORY_SURGERY_CENTER): Payer: 59 | Admitting: *Deleted

## 2021-09-06 VITALS — Ht 60.0 in | Wt 190.0 lb

## 2021-09-06 DIAGNOSIS — Z800 Family history of malignant neoplasm of digestive organs: Secondary | ICD-10-CM

## 2021-09-06 MED ORDER — NA SULFATE-K SULFATE-MG SULF 17.5-3.13-1.6 GM/177ML PO SOLN: 1.0000 | ORAL | 0 refills | Status: DC

## 2021-09-06 MED ORDER — ONDANSETRON HCL 4 MG PO TABS: 4.0000 mg | ORAL_TABLET | ORAL | 0 refills | Status: DC

## 2021-09-06 NOTE — Progress Notes (Signed)
Patient's pre-visit was done today over the phone with the patient due to COVID-19 pandemic. Name,DOB and address verified. Patient denies any allergies to Eggs and Soy. Patient denies any problems with anesthesia/sedation. Patient is not taking any diet pills or blood thinners. No home Oxygen. Packet of Prep instructions mailed to patient including a copy of a consent form-pt is aware. Patient understands to call us back with any questions or concerns. Patient is aware of our care-partner policy and [id number] safety protocol.

## 2021-09-14 ENCOUNTER — Other Ambulatory Visit: Payer: Self-pay | Admitting: Gastroenterology

## 2021-09-14 ENCOUNTER — Telehealth: Payer: Self-pay | Admitting: Gastroenterology

## 2021-09-14 ENCOUNTER — Ambulatory Visit: Payer: 59 | Admitting: Rehabilitative and Restorative Service Providers"

## 2021-09-14 MED ORDER — PANTOPRAZOLE SODIUM 40 MG PO TBEC: 40.0000 mg | DELAYED_RELEASE_TABLET | Freq: Two times a day (BID) | ORAL | 0 refills | Status: DC

## 2021-09-14 NOTE — Telephone Encounter (Signed)
Refill of pantoprazole sent to patient's pharmacy until scheduled colonoscopy in November.

## 2021-09-14 NOTE — Telephone Encounter (Signed)
Inbound call from pt requesting a call back stating that she needs a refill for her prescription pantoprazole. Please advise. Thank you.

## 2021-09-20 ENCOUNTER — Ambulatory Visit (AMBULATORY_SURGERY_CENTER): Payer: 59 | Admitting: Gastroenterology

## 2021-09-20 ENCOUNTER — Other Ambulatory Visit: Payer: Self-pay

## 2021-09-20 ENCOUNTER — Encounter: Payer: Self-pay | Admitting: Gastroenterology

## 2021-09-20 VITALS — BP 143/79 | HR 74 | Temp 98.7°F | Resp 17 | Ht 60.0 in | Wt 190.0 lb

## 2021-09-20 DIAGNOSIS — D123 Benign neoplasm of transverse colon: Secondary | ICD-10-CM | POA: Diagnosis not present

## 2021-09-20 DIAGNOSIS — Z1211 Encounter for screening for malignant neoplasm of colon: Secondary | ICD-10-CM

## 2021-09-20 DIAGNOSIS — Z800 Family history of malignant neoplasm of digestive organs: Secondary | ICD-10-CM

## 2021-09-20 MED ORDER — SODIUM CHLORIDE 0.9 % IV SOLN: 500.0000 mL | INTRAVENOUS | Status: DC

## 2021-09-20 NOTE — Op Note (Signed)
[place] [facility] Patient Name: [name] Procedure Date: 09/20/2021 10:37 AM MRN: [id number] Endoscopist: Ladene Artist , MD Age: 50 Referring MD:  Date of Birth: [birth year] Gender: Female Account #: [id number] Procedure:                Colonoscopy Indications:              Screening patient at increased risk: Family history                            of colorectal cancer in multiple 1st-degree                            relatives Medicines:                Monitored Anesthesia Care Procedure:                Pre-Anesthesia Assessment:                           - Prior to the procedure, a History and Physical                            was performed, and patient medications and                            allergies were reviewed. The patient's tolerance of                            previous anesthesia was also reviewed. The risks                            and benefits of the procedure and the sedation                            options and risks were discussed with the patient.                            All questions were answered, and informed consent                            was obtained. Prior Anticoagulants: The patient has                            taken no previous anticoagulant or antiplatelet                            agents. ASA Grade Assessment: II - A patient with                            mild systemic disease. After reviewing the risks                            and benefits, the patient was deemed in  satisfactory condition to undergo the procedure.                           After obtaining informed consent, the colonoscope                            was passed under direct vision. Throughout the                            procedure, the patient's blood pressure, pulse, and                            oxygen saturations were monitored continuously. The                            CF [id number] #[id number] was introduced through the  anus                            and advanced to the the cecum, identified by                            appendiceal orifice and ileocecal valve. The                            ileocecal valve, appendiceal orifice, and rectum                            were photographed. The quality of the bowel                            preparation was good. The colonoscopy was performed                            without difficulty. The patient tolerated the                            procedure well. Scope In: 11:18:03 AM Scope Out: 01:75:10 AM Scope Withdrawal Time: 0 hours 13 minutes 47 seconds  Total Procedure Duration: 0 hours 16 minutes 15 seconds  Findings:                 The perianal and digital rectal examinations were                            normal.                           Two sessile polyps were found in the transverse                            colon. The polyps were 7 to 8 mm in size. These                            polyps were removed with a cold snare. Resection  and retrieval were complete.                           A few small-mouthed diverticula were found in the                            sigmoid colon.                           The exam was otherwise without abnormality on                            direct and retroflexion views. Complications:            No immediate complications. Estimated blood loss:                            None. Estimated Blood Loss:     Estimated blood loss: none. Impression:               - Two 7 to 8 mm polyps in the transverse colon,                            removed with a cold snare. Resected and retrieved.                           - Very mild diverticulosis in the sigmoid colon.                           - The examination was otherwise normal on direct                            and retroflexion views. Recommendation:           - Repeat colonoscopy after studies are complete for                             surveillance based on pathology results.                           - Patient has a contact number available for                            emergencies. The signs and symptoms of potential                            delayed complications were discussed with the                            patient. Return to normal activities tomorrow.                            Written discharge instructions were provided to the                            patient.                           -  Resume previous diet.                           - Continue present medications.                           - Await pathology results. Ladene Artist, MD 09/20/2021 11:41:37 AM This report has been signed electronically.

## 2021-09-20 NOTE — Patient Instructions (Signed)
YOU HAD AN ENDOSCOPIC PROCEDURE TODAY AT [facility]:   Refer to the procedure report that was given to you for any specific questions about what was found during the examination.  If the procedure report does not answer your questions, please call your gastroenterologist to clarify.  If you requested that your care partner not be given the details of your procedure findings, then the procedure report has been included in a sealed envelope for you to review at your convenience later.  YOU SHOULD EXPECT: Some feelings of bloating in the abdomen. Passage of more gas than usual.  Walking can help get rid of the air that was put into your GI tract during the procedure and reduce the bloating. If you had a lower endoscopy (such as a colonoscopy or flexible sigmoidoscopy) you may notice spotting of blood in your stool or on the toilet paper. If you underwent a bowel prep for your procedure, you may not have a normal bowel movement for a few days.  Please Note:  You might notice some irritation and congestion in your nose or some drainage.  This is from the oxygen used during your procedure.  There is no need for concern and it should clear up in a day or so.  SYMPTOMS TO REPORT IMMEDIATELY:  Following lower endoscopy (colonoscopy or flexible sigmoidoscopy):  Excessive amounts of blood in the stool  Significant tenderness or worsening of abdominal pains  Swelling of the abdomen that is new, acute  Fever of 100F or higher    For urgent or emergent issues, a gastroenterologist can be reached at any hour by calling [phone]. Do not use MyChart messaging for urgent concerns.    DIET:  We do recommend a small meal at first, but then you may proceed to your regular diet.  Drink plenty of fluids but you should avoid alcoholic beverages for 24 hours.  ACTIVITY:  You should plan to take it easy for the rest of today and you should NOT DRIVE or use heavy machinery until tomorrow (because  of the sedation medicines used during the test).    FOLLOW UP: Our staff will call the number listed on your records 48-72 hours following your procedure to check on you and address any questions or concerns that you may have regarding the information given to you following your procedure. If we do not reach you, we will leave a message.  We will attempt to reach you two times.  During this call, we will ask if you have developed any symptoms of COVID 19. If you develop any symptoms (ie: fever, flu-like symptoms, shortness of breath, cough etc.) before then, please call [phone].  If you test positive for Covid 19 in the 2 weeks post procedure, please call and report this information to [place].    If any biopsies were taken you will be contacted by phone or by letter within the next 1-3 weeks.  Please call us at [phone] if you have not heard about the biopsies in 3 weeks.    SIGNATURES/CONFIDENTIALITY: You and/or your care partner have signed paperwork which will be entered into your electronic medical record.  These signatures attest to the fact that that the information above on your After Visit Summary has been reviewed and is understood.  Full responsibility of the confidentiality of this discharge information lies with you and/or your care-partner.    RESUME MEDICATU\IONS. INFORMATION GIVEN ON POLYPS AND DIVERTICULOSIS.

## 2021-09-20 NOTE — Progress Notes (Signed)
To PACU, VSS. Report to [email or web address] 

## 2021-09-20 NOTE — Progress Notes (Signed)
History & Physical  Primary Care Physician:  Wendie Agreste, MD Primary Gastroenterologist: Lucio Edward, MD  CHIEF COMPLAINT:  Family history of colon cancer  HPI: [name] is a 50 y.o. female with a strong family history of colon cancer presenting for colonoscopy.    Past Medical History:  Diagnosis Date   Arthritis    Asthma    Complication of anesthesia    respirations slow down after coming out from Anesthesia   Endometriosis    GERD (gastroesophageal reflux disease)    Mass of right kidney    Post-operative nausea and vomiting     Past Surgical History:  Procedure Laterality Date   ABDOMINAL HYSTERECTOMY     CERVICAL DISC SURGERY     CHOLECYSTECTOMY     COLONOSCOPY  12/17/2017   Dr.Simona Rocque   EUS N/A 03/14/2017   Procedure: UPPER ENDOSCOPIC ULTRASOUND (EUS) LINEAR;  Surgeon: Milus Banister, MD;  Location: [facility];  Service: Endoscopy;  Laterality: N/A;   I & D EXTREMITY Left 05/26/2021   Procedure: IRRIGATION AND DEBRIDEMENT EXTREMITY WITH COMPLEX CLOSURE OF WOUND;  Surgeon: Meredith Pel, MD;  Location: [place];  Service: Orthopedics;  Laterality: Left;   KNEE ARTHROSCOPY Left 05/26/2021   Procedure: ARTHROSCOPIC KNEE WASHOUT;  Surgeon: Meredith Pel, MD;  Location: [place];  Service: Orthopedics;  Laterality: Left;   LAPAROSCOPIC ABDOMINAL EXPLORATION N/A 05/17/2017   Procedure: LAPAROSCOPIC POSSIBLE OPEN REMOVAL OF STOMACH MASS;  Surgeon: Jackolyn Confer, MD;  Location: [facility];  Service: General;  Laterality: N/A;   LAPAROSCOPIC NEPHRECTOMY Right 05/17/2017   Procedure: RIGHT RADICAL NEPHRECTOMY, URETERECTOMY;  Surgeon: Ardis Hughs, MD;  Location: [facility];  Service: Urology;  Laterality: Right;   tubal preganancy      Prior to Admission medications   Medication Sig Start Date End Date Taking? Authorizing Provider  ondansetron (ZOFRAN) 4 MG tablet Take 1 tablet (4 mg total) by mouth as directed. Take one Zofran pill 30-60  minutes before each colonoscopy prep dose 09/06/21  Yes Ladene Artist, MD  pantoprazole (PROTONIX) 40 MG tablet Take 1 tablet (40 mg total) by mouth 2 (two) times daily. 09/14/21  Yes Ladene Artist, MD  albuterol (PROVENTIL HFA) 108 (90 Base) MCG/ACT inhaler Inhale 2 puffs into the lungs every 6 (six) hours as needed for wheezing or shortness of breath. 07/20/21   Wendie Agreste, MD  Biotin w/ Vitamins C & E (HAIR SKIN & NAILS GUMMIES PO) Take 2 tablets by mouth daily.     [provider]  Cholecalciferol (VITAMIN D PO) Take 2 tablets by mouth daily.    [provider]  Cyanocobalamin (VITAMIN B12 PO) Take 2 tablets by mouth daily.    [provider]  docusate sodium (COLACE) 100 MG capsule Take 1 capsule (100 mg total) by mouth 2 (two) times daily. 05/27/21   Magnant, Gerrianne Scale, PA-C  Multiple Vitamins-Minerals (MULTIVITAMIN GUMMIES ADULT) CHEW Chew 2 tablets by mouth daily.    [provider]    Current Outpatient Medications  Medication Sig Dispense Refill   ondansetron (ZOFRAN) 4 MG tablet Take 1 tablet (4 mg total) by mouth as directed. Take one Zofran pill 30-60 minutes before each colonoscopy prep dose 2 tablet 0   pantoprazole (PROTONIX) 40 MG tablet Take 1 tablet (40 mg total) by mouth 2 (two) times daily. 60 tablet 0   albuterol (PROVENTIL HFA) 108 (90 Base) MCG/ACT inhaler Inhale 2 puffs into the lungs every  6 (six) hours as needed for wheezing or shortness of breath. 18 g 0   Biotin w/ Vitamins C & E (HAIR SKIN & NAILS GUMMIES PO) Take 2 tablets by mouth daily.      Cholecalciferol (VITAMIN D PO) Take 2 tablets by mouth daily.     Cyanocobalamin (VITAMIN B12 PO) Take 2 tablets by mouth daily.     docusate sodium (COLACE) 100 MG capsule Take 1 capsule (100 mg total) by mouth 2 (two) times daily. 10 capsule 0   Multiple Vitamins-Minerals (MULTIVITAMIN GUMMIES ADULT) CHEW Chew 2 tablets by mouth daily.     Current Facility-Administered  Medications  Medication Dose Route Frequency Provider Last Rate Last Admin   0.9 %  sodium chloride infusion  500 mL Intravenous Continuous Ladene Artist, MD       ipratropium-albuterol (DUONEB) 0.5-2.5 (3) MG/3ML nebulizer solution 3 mL  3 mL Nebulization Q6H Nafziger, Cory, NP        Allergies as of 09/20/2021 - Review Complete 09/20/2021  Allergen Reaction Noted   Amoxicillin Anaphylaxis 03/08/2017   Ibuprofen Anaphylaxis and Hives 05/09/2017   Keflex [cephalexin] Anaphylaxis 05/09/2017   Penicillins Anaphylaxis    Baby powder [methylbenzethonium] Hives 04/05/2017   Doxycycline Diarrhea and Rash 12/25/2016    Family History  Problem Relation Age of Onset   Hypertension Mother    Cancer Mother 29       "back cancer"   Other Mother        hysterectomy at 49 for "pre-cancerous cervical cells"   Hypertension Father    Colon cancer Father 33   Cervical cancer Sister        full sister dx. unspecified age   Colon polyps Sister        unspecified number   Colon polyps Sister        paternal half-sister w/ colon polyps dx. age 23 or younger - unspecified number   Colon cancer Sister 7       paternal half-sister    Colon polyps Sister        paternal half-sister; unspecified number   Colon cancer Sister        paternal half-sister dx. before age 100; has had two colon cancers   Breast cancer Sister        paternal half-sister dx under age 31   Renal cancer Sister 46       paternal half-sister dx. w/ "[facility] - lymphoid renal cancer"   Cancer Sister 15       paternal half-sister dx cancer of wrist that was not a skin cancer   Colon cancer Paternal Aunt        (x2) paternal aunts with colon cancer at unspecified ages; lim info   Breast cancer Paternal Aunt        (x3) paternal aunts with breast cancer at unspecified ages; lim info   Colon cancer Paternal Uncle        dx. unspecified age; lim info   Breast cancer Maternal Grandmother        unspecified age   Diabetes Maternal  Grandmother    Diabetes Paternal Grandfather    Colon polyps Son 75       has had a "bleeding polyp"   Cervical cancer Other 82       niece; +hysterectomy   Esophageal cancer Neg Hx    Stomach cancer Neg Hx     Social History   Socioeconomic History   Marital status: Married  Spouse name: Not on file   Number of children: Not on file   Years of education: Not on file   Highest education level: Not on file  Occupational History   Not on file  Tobacco Use   Smoking status: Never   Smokeless tobacco: Never  Vaping Use   Vaping Use: Never used  Substance and Sexual Activity   Alcohol use: No   Drug use: No   Sexual activity: Yes    Birth control/protection: Surgical  Other Topics Concern   Not on file  Social History Narrative   Not on file   Social Determinants of Health   Financial Resource Strain: Not on file  Food Insecurity: Not on file  Transportation Needs: Not on file  Physical Activity: Not on file  Stress: Not on file  Social Connections: Not on file  Intimate Partner Violence: Not on file    Review of Systems:  All systems reviewed an negative except where noted in HPI.  Gen: Denies any fever, chills, sweats, anorexia, fatigue, weakness, malaise, weight loss, and sleep disorder CV: Denies chest pain, angina, palpitations, syncope, orthopnea, PND, peripheral edema, and claudication. Resp: Denies dyspnea at rest, dyspnea with exercise, cough, sputum, wheezing, coughing up blood, and pleurisy. GI: Denies vomiting blood, jaundice, and fecal incontinence.   Denies dysphagia or odynophagia. GU : Denies urinary burning, blood in urine, urinary frequency, urinary hesitancy, nocturnal urination, and urinary incontinence. MS: Denies joint pain, limitation of movement, and swelling, stiffness, low back pain, extremity pain. Denies muscle weakness, cramps, atrophy.  Derm: Denies rash, itching, dry skin, hives, moles, warts, or unhealing ulcers.  Psych: Denies  depression, anxiety, memory loss, suicidal ideation, hallucinations, paranoia, and confusion. Heme: Denies bruising, bleeding, and enlarged lymph nodes. Neuro:  Denies any headaches, dizziness, paresthesias. Endo:  Denies any problems with DM, thyroid, adrenal function.   Physical Exam: General:  Alert, well-developed, in NAD Head:  Normocephalic and atraumatic. Eyes:  Sclera clear, no icterus.   Conjunctiva pink. Ears:  Normal auditory acuity. Mouth:  No deformity or lesions.  Neck:  Supple; no masses . Lungs:  Clear throughout to auscultation.   No wheezes, crackles, or rhonchi. No acute distress. Heart:  Regular rate and rhythm; no murmurs. Abdomen:  Soft, nondistended, nontender. No masses, hepatomegaly. No obvious masses.  Normal bowel .    Rectal:  Deferred   Msk:  Symmetrical without gross deformities.. Pulses:  Normal pulses noted. Extremities:  Without edema. Neurologic:  Alert and  oriented x4;  grossly normal neurologically. Skin:  Intact without significant lesions or rashes. Cervical Nodes:  No significant cervical adenopathy. Psych:  Alert and cooperative. Normal mood and affect.   Impression / Plan:   Strong family history of colon cancer for colonoscopy.     Pricilla Riffle. Fuller Plan  09/20/2021, 11:14 AM See Shea Evans, [facility], to contact our on call provider

## 2021-09-20 NOTE — Progress Notes (Signed)
Called to room to assist during endoscopic procedure.  Patient ID and intended procedure confirmed with present staff. Received instructions for my participation in the procedure from the performing physician.  

## 2021-09-21 ENCOUNTER — Encounter: Payer: Self-pay | Admitting: Family Medicine

## 2021-09-21 ENCOUNTER — Ambulatory Visit (INDEPENDENT_AMBULATORY_CARE_PROVIDER_SITE_OTHER): Payer: 59 | Admitting: Family Medicine

## 2021-09-21 VITALS — BP 128/68 | HR 100 | Temp 98.4°F | Resp 16 | Ht 60.0 in | Wt 192.8 lb

## 2021-09-21 DIAGNOSIS — Z905 Acquired absence of kidney: Secondary | ICD-10-CM

## 2021-09-21 DIAGNOSIS — Z13220 Encounter for screening for lipoid disorders: Secondary | ICD-10-CM

## 2021-09-21 DIAGNOSIS — J45991 Cough variant asthma: Secondary | ICD-10-CM

## 2021-09-21 DIAGNOSIS — Z1329 Encounter for screening for other suspected endocrine disorder: Secondary | ICD-10-CM

## 2021-09-21 DIAGNOSIS — E669 Obesity, unspecified: Secondary | ICD-10-CM

## 2021-09-21 DIAGNOSIS — Z800 Family history of malignant neoplasm of digestive organs: Secondary | ICD-10-CM

## 2021-09-21 DIAGNOSIS — Z6837 Body mass index (BMI) 37.0-37.9, adult: Secondary | ICD-10-CM

## 2021-09-21 DIAGNOSIS — Z131 Encounter for screening for diabetes mellitus: Secondary | ICD-10-CM | POA: Diagnosis not present

## 2021-09-21 NOTE — Progress Notes (Signed)
Subjective:  Patient ID: [name], female    DOB: 05-18-1971  Age: 50 y.o. MRN: [id number]  CC:  Chief Complaint  Patient presents with   New Patient (Initial Visit)    Pt here to establish care, reports she is here bc her previous PC has retired     HPI [name] presents for  New patient to establish care, previous primary care provider retired. Dr. Lianne Cure.   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   Gastroenterologic history of GERD, treated with Protonix BID.  has also been treated with Colace if needed for constipated, followed by Gastroenterologist Dr. Fuller Plan. Partial gastrectomy with gastric tumor in 2018.  (All at same time with  renal mass and other surgeries).   Colonoscopy yesterday - 2 polyps. Yearly colonoscopy - father deceased with colon CA, FH of colon CA - aunt, father. Sisters with precancerous polyps.   Hx of nephrectomy.  Benign masses of R kidney, ureter in 2018 - s/p R nephrectomy and ureter resection. 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 Urologist Dr. Louis Meckel, last visit earlier this year.  Lab Results  Component Value Date   CREATININE 0.76 11/05/2019   Asthma, cough variant, mild intermittent.  Albuterol inhaler as needed - none needed recently, only with infections.   On B12, vit D - prior partial gastrectomy. No known hx of low readings.  No results found for: VITAMINB12  Last vitamin D No results found for: 25OHVITD2, 25OHVITD3, VD25OH  Obesity: Has been having difficulty losing weight since surgery 4 years ago.  Has tried exercise, watching diet. Has met with nutritionist, and [facility] - no change in weight. Would like to meet with bariatric surgeon to discuss options.  Body mass index is 37.65 kg/m. Wt Readings from Last 3 Encounters:  09/21/21 192 lb 12.8 oz (87.5 kg)  09/20/21 190  lb (86.2 kg)  09/06/21 190 lb (86.2 kg)   Not fasting today.   No results found for: HGBA1C FH of diabetes, no personal hx.   HM: Declined covid 19 vaccines.  Declines flu vaccine this year. Does not want to get sick - recommended.   Mammogram and gyn visit in few weeks. GYN - Vanessa Kick.   history Patient Active Problem List   Diagnosis Date Noted   Open fracture of left patella    Laceration of left knee with complication    Open wound of left knee    S/P left knee arthroscopy 05/27/2021   Laceration of left knee 05/26/2021   Mass of ureter s/p resection 05/17/2017 05/18/2017   Atrophic kidney s/p right nepherctomy 05/17/2017 05/18/2017   GERD (gastroesophageal reflux disease)    Complication of anesthesia    Arthritis    Gastric tumor s/p partial gastrectomy 05/17/2016 05/17/2017   Solitary pulmonary nodule 02/18/2017   Genetic testing 04/04/2016   Family history of colon cancer 03/14/2016   Family history of breast cancer in female 03/14/2016   Morbid obesity due to excess calories ([place]) 01/18/2012   Anxiety state 04/20/2008   Cough variant asthma 04/20/2008   LIVER FUNCTION TESTS, ABNORMAL 04/20/2008   Attention deficit hyperactivity disorder (ADHD) 06/10/2007   Past Medical History:  Diagnosis Date   Arthritis    Asthma    Complication of anesthesia    respirations slow down after coming out from Anesthesia   Endometriosis    GERD (  gastroesophageal reflux disease)    Mass of right kidney    Post-operative nausea and vomiting    Past Surgical History:  Procedure Laterality Date   ABDOMINAL HYSTERECTOMY     CERVICAL DISC SURGERY     CHOLECYSTECTOMY     COLONOSCOPY  12/17/2017   Dr.Stark   EUS N/A 03/14/2017   Procedure: UPPER ENDOSCOPIC ULTRASOUND (EUS) LINEAR;  Surgeon: Milus Banister, MD;  [facility];  Service: Endoscopy;  Laterality: N/A;   I & D EXTREMITY Left 05/26/2021   Procedure: IRRIGATION AND DEBRIDEMENT EXTREMITY WITH COMPLEX  CLOSURE OF WOUND;  Surgeon: Meredith Pel, MD;  Location: [place];  Service: Orthopedics;  Laterality: Left;   KNEE ARTHROSCOPY Left 05/26/2021   Procedure: ARTHROSCOPIC KNEE WASHOUT;  Surgeon: Meredith Pel, MD;  Location: [place];  Service: Orthopedics;  Laterality: Left;   LAPAROSCOPIC ABDOMINAL EXPLORATION N/A 05/17/2017   Procedure: LAPAROSCOPIC POSSIBLE OPEN REMOVAL OF STOMACH MASS;  Surgeon: Jackolyn Confer, MD;  [facility];  Service: General;  Laterality: N/A;   LAPAROSCOPIC NEPHRECTOMY Right 05/17/2017   Procedure: RIGHT RADICAL NEPHRECTOMY, URETERECTOMY;  Surgeon: Ardis Hughs, MD;  [facility];  Service: Urology;  Laterality: Right;   tubal preganancy     Allergies  Allergen Reactions   Amoxicillin Anaphylaxis    All "cillins" pt was 31-78 years old  Has patient had a PCN reaction causing immediate rash, facial/tongue/throat swelling, SOB or lightheadedness with hypotension: yes Has patient had a PCN reaction causing severe rash involving mucus membranes or skin necrosis: unknown Has patient had a PCN reaction that required hospitalization yes Has patient had a PCN reaction occurring within the last 10 years:no If all of the above answers are "NO", then may proceed with Cephalosporin use.    Ibuprofen Anaphylaxis and Hives   Keflex [Cephalexin] Anaphylaxis   Penicillins Anaphylaxis    Has patient had a PCN reaction causing immediate rash, facial/tongue/throat swelling, SOB or lightheadedness with hypotension:yes Has patient had a PCN reaction causing severe rash involving mucus membranes or skin necrosis: unknown Has patient had a PCN reaction that required hospitalization yes  Has patient had a PCN reaction occurring within the last 10 years: no If all of the above answers are "NO", then may proceed with Cephalosporin use.    Baby Powder [Methylbenzethonium] Hives   Doxycycline Diarrhea and Rash   Prior to Admission medications   Medication Sig  Start Date End Date Taking? Authorizing Provider  albuterol (PROVENTIL HFA) 108 (90 Base) MCG/ACT inhaler Inhale 2 puffs into the lungs every 6 (six) hours as needed for wheezing or shortness of breath. 07/20/21  Yes Wendie Agreste, MD  Biotin w/ Vitamins C & E (HAIR SKIN & NAILS GUMMIES PO) Take 2 tablets by mouth daily.    Yes [provider]  Cholecalciferol (VITAMIN D PO) Take 2 tablets by mouth daily.   Yes [provider]  Cyanocobalamin (VITAMIN B12 PO) Take 2 tablets by mouth daily.   Yes [provider]  docusate sodium (COLACE) 100 MG capsule Take 1 capsule (100 mg total) by mouth 2 (two) times daily. 05/27/21  Yes Magnant, Gerrianne Scale, PA-C  Multiple Vitamins-Minerals (MULTIVITAMIN GUMMIES ADULT) CHEW Chew 2 tablets by mouth daily.   Yes [provider]  pantoprazole (PROTONIX) 40 MG tablet Take 1 tablet (40 mg total) by mouth 2 (two) times daily. 09/14/21  Yes Ladene Artist, MD  ondansetron (ZOFRAN) 4 MG tablet Take 1 tablet (4 mg total)  by mouth as directed. Take one Zofran pill 30-60 minutes before each colonoscopy prep dose Patient not taking: Reported on 09/21/2021 09/06/21   Ladene Artist, MD   Social History   Socioeconomic History   Marital status: Married    Spouse name: Not on file   Number of children: Not on file   Years of education: Not on file   Highest education level: Not on file  Occupational History   Not on file  Tobacco Use   Smoking status: Never   Smokeless tobacco: Never  Vaping Use   Vaping Use: Never used  Substance and Sexual Activity   Alcohol use: No   Drug use: No   Sexual activity: Yes    Birth control/protection: Surgical  Other Topics Concern   Not on file  Social History Narrative   Not on file   Social Determinants of Health   Financial Resource Strain: Not on file  Food Insecurity: Not on file  Transportation Needs: Not on file  Physical Activity: Not on file  Stress: Not on file  Social  Connections: Not on file  Intimate Partner Violence: Not on file    Review of Systems Per HPI.   Objective:   Vitals:   09/21/21 0912  BP: 128/68  Pulse: 100  Resp: 16  Temp: 98.4 F (36.9 C)  TempSrc: Temporal  SpO2: 96%  Weight: 192 lb 12.8 oz (87.5 kg)  Height: 5' (1.524 m)     Physical Exam Vitals reviewed.  Constitutional:      General: She is not in acute distress.    Appearance: Normal appearance. She is well-developed. She is obese. She is not ill-appearing, toxic-appearing or diaphoretic.  HENT:     Head: Normocephalic and atraumatic.  Eyes:     Conjunctiva/sclera: Conjunctivae normal.     Pupils: Pupils are equal, round, and reactive to light.  Neck:     Vascular: No carotid bruit.  Cardiovascular:     Rate and Rhythm: Normal rate and regular rhythm.     Heart sounds: Normal heart sounds.  Pulmonary:     Effort: Pulmonary effort is normal.     Breath sounds: Normal breath sounds.  Abdominal:     Palpations: Abdomen is soft. There is no pulsatile mass.     Tenderness: There is no abdominal tenderness.  Musculoskeletal:     Right lower leg: No edema.     Left lower leg: No edema.  Skin:    General: Skin is warm and dry.  Neurological:     Mental Status: She is alert and oriented to person, place, and time.  Psychiatric:        Mood and Affect: Mood normal.        Behavior: Behavior normal.    Assessment & Plan:  Seletha Zimmermann is a 50 y.o. female . Class 2 obesity with body mass index (BMI) of 37.0 to 37.9 in adult, unspecified obesity type, unspecified whether serious comorbidity present - Plan: Ambulatory referral to General Surgery, TSH  -As above difficulty with weight loss, and is reported previous evaluation and treatment by healthy [facility] wellness as well as nutritionist without significant improvement.  Would like to meet with surgeon.  History of partial gastric resection as well as solitary kidney.  Recommend she discuss that  prior history with bariatric surgeon to see if she is a candidate for surgical treatment.  She also asked about medication such as tirzepatide.  This may be discussed best  with weight management specialist.  Discuss further at follow-up visit.  Screening for hypercholesterolemia - Plan: Comprehensive metabolic panel, Lipid panel  Screening for diabetes mellitus - Plan: Comprehensive metabolic panel, Hemoglobin A1c  Cough variant asthma  -Stable, has albuterol if needed.  No changes  Family history of colon cancer  -Colonoscopy yesterday, ongoing follow-up with gastroenterology.  GERD stable with twice daily Protonix, again followed by gastroenterology.  H/O right nephrectomy  -Check CMP for creatinine with labs tomorrow.  Screening for thyroid disorder - Plan: TSH   No orders of the defined types were placed in this encounter.  Patient Instructions  I will place a referral to bariatric specialist to look at surgical options.  As far as medications that may be something we need to discuss further or have you meet with a weight management specialist to determine the right medication if indicated. Fasting labs tomorrow.  Physical in 3 months.  No medication changes for now.  Thanks for coming in today.  Let me know if there are questions.    Signed,   Merri Ray, MD [facility] 09/21/21 10:30 AM

## 2021-09-21 NOTE — Patient Instructions (Signed)
I will place a referral to bariatric specialist to look at surgical options.  As far as medications that may be something we need to discuss further or have you meet with a weight management specialist to determine the right medication if indicated. Fasting labs tomorrow.  Physical in 3 months.  No medication changes for now.  Thanks for coming in today.  Let me know if there are questions.

## 2021-09-22 ENCOUNTER — Telehealth: Payer: Self-pay

## 2021-09-22 NOTE — Telephone Encounter (Signed)
  Follow up Call-  Call back number 09/20/2021  Post procedure Call Back phone  # [phone]  Permission to leave phone message No  Some recent data might be hidden     Patient questions:  Do you have a fever, pain , or abdominal swelling? No. Pain Score  0 *  Have you tolerated food without any problems? Yes.    Have you been able to return to your normal activities? Yes.    Do you have any questions about your discharge instructions: Diet   No. Medications  No. Follow up visit  No.  Do you have questions or concerns about your Care? No.  Actions: * If pain score is 4 or above: No action needed, pain <4.Have you developed a fever since your procedure? no  2.   Have you had an respiratory symptoms (SOB or cough) since your procedure? no  3.   Have you tested positive for COVID 19 since your procedure no  4.   Have you had any family members/close contacts diagnosed with the COVID 19 since your procedure?  no   If yes to any of these questions please route to Joylene John, RN and Joella Prince, RN

## 2021-09-22 NOTE — Telephone Encounter (Signed)
No VM set up to leave message.

## 2021-09-23 ENCOUNTER — Other Ambulatory Visit: Payer: Self-pay

## 2021-09-23 ENCOUNTER — Ambulatory Visit
Admission: RE | Admit: 2021-09-23 | Discharge: 2021-09-23 | Disposition: A | Discharge status: Home / Self Care | Payer: 59 | Source: Ambulatory Visit | Attending: Family Medicine | Admitting: Family Medicine

## 2021-09-23 DIAGNOSIS — Z1231 Encounter for screening mammogram for malignant neoplasm of breast: Secondary | ICD-10-CM

## 2021-09-25 ENCOUNTER — Other Ambulatory Visit: Payer: Self-pay | Admitting: Family Medicine

## 2021-09-25 DIAGNOSIS — R928 Other abnormal and inconclusive findings on diagnostic imaging of breast: Secondary | ICD-10-CM

## 2021-09-26 ENCOUNTER — Other Ambulatory Visit (INDEPENDENT_AMBULATORY_CARE_PROVIDER_SITE_OTHER): Payer: 59

## 2021-09-26 DIAGNOSIS — Z6837 Body mass index (BMI) 37.0-37.9, adult: Secondary | ICD-10-CM

## 2021-09-26 DIAGNOSIS — Z1329 Encounter for screening for other suspected endocrine disorder: Secondary | ICD-10-CM | POA: Diagnosis not present

## 2021-09-26 DIAGNOSIS — Z131 Encounter for screening for diabetes mellitus: Secondary | ICD-10-CM

## 2021-09-26 DIAGNOSIS — Z13220 Encounter for screening for lipoid disorders: Secondary | ICD-10-CM | POA: Diagnosis not present

## 2021-09-26 DIAGNOSIS — E669 Obesity, unspecified: Secondary | ICD-10-CM | POA: Diagnosis not present

## 2021-09-26 LAB — LIPID PANEL
Cholesterol: 203 mg/dL — ABNORMAL HIGH (ref 0–200)
HDL: 47.2 mg/dL (ref >39.00)
LDL Cholesterol: 129 mg/dL — ABNORMAL HIGH (ref 0–99)
NonHDL: 156.06
Total CHOL/HDL Ratio: 4
Triglycerides: 135 mg/dL (ref 0.0–149.0)
VLDL: 27 mg/dL (ref 0.0–40.0)

## 2021-09-26 LAB — COMPREHENSIVE METABOLIC PANEL
ALT: 42 U/L — ABNORMAL HIGH (ref 0–35)
AST: 20 U/L (ref 0–37)
Albumin: 4.3 g/dL (ref 3.5–5.2)
Alkaline Phosphatase: 79 U/L (ref 39–117)
BUN: 14 mg/dL (ref 6–23)
CO2: 24 mEq/L (ref 19–32)
Calcium: 9.5 mg/dL (ref 8.4–10.5)
Chloride: 105 mEq/L (ref 96–112)
Creatinine, Ser: 0.78 mg/dL (ref 0.40–1.20)
GFR: 87.12 mL/min (ref >60.00)
Glucose, Bld: 100 mg/dL — ABNORMAL HIGH (ref 70–99)
Potassium: 4.3 mEq/L (ref 3.5–5.1)
Sodium: 139 mEq/L (ref 135–145)
Total Bilirubin: 0.3 mg/dL (ref 0.2–1.2)
Total Protein: 6.5 g/dL (ref 6.0–8.3)

## 2021-09-26 LAB — TSH: TSH: 0 u[IU]/mL — ABNORMAL LOW (ref 0.35–5.50)

## 2021-09-26 LAB — HEMOGLOBIN A1C: Hgb A1c MFr Bld: 6.3 % (ref 4.6–6.5)

## 2021-09-27 ENCOUNTER — Ambulatory Visit: Payer: 59

## 2021-10-03 ENCOUNTER — Encounter: Payer: Self-pay | Admitting: Gastroenterology

## 2021-10-03 ENCOUNTER — Ambulatory Visit: Payer: 59 | Admitting: Physical Therapy

## 2021-10-03 ENCOUNTER — Other Ambulatory Visit: Payer: Self-pay | Admitting: Family Medicine

## 2021-10-03 DIAGNOSIS — R7989 Other specified abnormal findings of blood chemistry: Secondary | ICD-10-CM

## 2021-10-03 NOTE — Progress Notes (Signed)
See labs 

## 2021-10-04 ENCOUNTER — Telehealth: Payer: Self-pay

## 2021-10-04 NOTE — Telephone Encounter (Signed)
TSH labs ordered for labs Friday

## 2021-10-06 ENCOUNTER — Other Ambulatory Visit (INDEPENDENT_AMBULATORY_CARE_PROVIDER_SITE_OTHER): Payer: 59

## 2021-10-06 DIAGNOSIS — R7989 Other specified abnormal findings of blood chemistry: Secondary | ICD-10-CM | POA: Diagnosis not present

## 2021-10-06 LAB — TSH: TSH: 0.01 u[IU]/mL — ABNORMAL LOW (ref 0.35–5.50)

## 2021-10-06 LAB — T4, FREE: Free T4: 0.8 ng/dL (ref 0.60–1.60)

## 2021-10-06 LAB — T3, FREE: T3, Free: 4.2 pg/mL (ref 2.3–4.2)

## 2021-10-10 ENCOUNTER — Telehealth: Payer: Self-pay

## 2021-10-10 NOTE — Telephone Encounter (Signed)
Caller name:Zoeya Garcia-Hill  On DPR? :Yes  Call back number:[phone]  Provider they see: Carlota Raspberry  Reason for call:Pt os calling back about the results on her labs so she can start on thyroid meds

## 2021-10-11 ENCOUNTER — Ambulatory Visit: Payer: 59 | Admitting: Physical Therapy

## 2021-10-11 ENCOUNTER — Other Ambulatory Visit: Payer: Self-pay | Admitting: Family Medicine

## 2021-10-11 DIAGNOSIS — E0590 Thyrotoxicosis, unspecified without thyrotoxic crisis or storm: Secondary | ICD-10-CM

## 2021-10-11 NOTE — Telephone Encounter (Signed)
Can someone please call her about her lab results. She would like a phone call back today. She doesn't do mychart.

## 2021-10-11 NOTE — Telephone Encounter (Signed)
See lab notes

## 2021-10-11 NOTE — Progress Notes (Signed)
See lab notes

## 2021-10-12 ENCOUNTER — Other Ambulatory Visit: Payer: Self-pay | Admitting: Registered Nurse

## 2021-10-12 DIAGNOSIS — E0590 Thyrotoxicosis, unspecified without thyrotoxic crisis or storm: Secondary | ICD-10-CM

## 2021-10-12 NOTE — Telephone Encounter (Signed)
Called pt back, she is agreeable to referral would like soonest appt, lab appt made for Friday

## 2021-10-13 ENCOUNTER — Other Ambulatory Visit: Payer: 59

## 2021-10-13 DIAGNOSIS — E0590 Thyrotoxicosis, unspecified without thyrotoxic crisis or storm: Secondary | ICD-10-CM

## 2021-10-13 NOTE — Telephone Encounter (Signed)
Noted - referral has been ordered.

## 2021-10-16 LAB — THYROGLOBULIN ANTIBODY: Thyroglobulin Ab: <1 IU/mL (ref <=1)

## 2021-10-16 LAB — THYROID PEROXIDASE ANTIBODY: Thyroperoxidase Ab SerPl-aCnc: 16 IU/mL — ABNORMAL HIGH (ref <9)

## 2021-10-16 NOTE — Progress Notes (Signed)
FYI - I replaced these orders as they needed to go to [facility] - but I'll defer to your interpretation

## 2021-10-17 ENCOUNTER — Telehealth: Payer: Self-pay

## 2021-10-17 NOTE — Telephone Encounter (Signed)
Called patient to discuss results, no answer and VM not set up. Please advise results if call back

## 2021-10-17 NOTE — Telephone Encounter (Signed)
Pt called back and was given results

## 2021-10-17 NOTE — Telephone Encounter (Signed)
-----   Message from Wendie Agreste, MD sent at 10/17/2021  1:19 PM EST ----- Noted - call pt - one of the thyroid antibody tests was elevated.  This may be contributing to her abnormal labs.  Can discuss further at upcoming appointment with endocrinology.  Let me know if there are questions in the meantime.

## 2021-10-19 ENCOUNTER — Encounter: Payer: Self-pay | Admitting: Internal Medicine

## 2021-10-19 ENCOUNTER — Other Ambulatory Visit: Payer: Self-pay

## 2021-10-19 ENCOUNTER — Ambulatory Visit (INDEPENDENT_AMBULATORY_CARE_PROVIDER_SITE_OTHER): Payer: 59 | Admitting: Internal Medicine

## 2021-10-19 VITALS — BP 118/70 | HR 83 | Ht 60.0 in | Wt 195.0 lb

## 2021-10-19 DIAGNOSIS — E0590 Thyrotoxicosis, unspecified without thyrotoxic crisis or storm: Secondary | ICD-10-CM | POA: Diagnosis not present

## 2021-10-19 MED ORDER — METHIMAZOLE 5 MG PO TABS: 5.0000 mg | ORAL_TABLET | Freq: Every day | ORAL | 1 refills | Status: DC

## 2021-10-19 NOTE — Progress Notes (Signed)
Name: [name]  MRN/ DOB: [id number], Apr 27, 1971    Age/ Sex: 50 y.o., female    PCP: Wendie Agreste, MD   Reason for Endocrinology Evaluation: Subclinical Hyperthyroidism     Date of Initial Endocrinology Evaluation: 10/19/2021     HPI: [name] is a 50 y.o. female with a past medical history of hyperthyroidism, Asthma and Hx of right nephrectomy for benign reasons. The patient presented for initial endocrinology clinic visit on 10/19/2021 for consultative assistance with her Subclinical Hyperthyroidism.   She has been noted with suppressed TSh at 0.00 uIU/mL in 09/2021 ( no concommitent FT4at the time) Repeat testing confirmed a TSH 0.01 uIU/mL with normal FT4 at 0.8 ng/dL and normal FT3 at 4.2 pg/mL   She was also found to have slight elevation of Anti-TPO Abs at 16 IU/mL    She presented with asthma attack 07/2021   She has been noted with weight gain  Has noted palpitations for a while now  Has loose stools and diarrhea  Has occasional tremors and local neck symptoms  She also endorses heat intolerance and fatigue  She stopped taking Biotin since 05/2021  Denies radiation exposure   Mother and sister with thyroid disease      HISTORY:  Past Medical History:  Past Medical History:  Diagnosis Date   Arthritis    Asthma    Complication of anesthesia    respirations slow down after coming out from Anesthesia   Endometriosis    GERD (gastroesophageal reflux disease)    Mass of right kidney    Post-operative nausea and vomiting    Past Surgical History:  Past Surgical History:  Procedure Laterality Date   ABDOMINAL HYSTERECTOMY     CERVICAL DISC SURGERY     CHOLECYSTECTOMY     COLONOSCOPY  12/17/2017   Dr.Stark   EUS N/A 03/14/2017   Procedure: UPPER ENDOSCOPIC ULTRASOUND (EUS) LINEAR;  Surgeon: Milus Banister, MD;  Location: [facility];  Service: Endoscopy;  Laterality: N/A;   I & D EXTREMITY Left 05/26/2021    Procedure: IRRIGATION AND DEBRIDEMENT EXTREMITY WITH COMPLEX CLOSURE OF WOUND;  Surgeon: Meredith Pel, MD;  Location: [facility];  Service: Orthopedics;  Laterality: Left;   KNEE ARTHROSCOPY Left 05/26/2021   Procedure: ARTHROSCOPIC KNEE WASHOUT;  Surgeon: Meredith Pel, MD;  Location: [facility];  Service: Orthopedics;  Laterality: Left;   LAPAROSCOPIC ABDOMINAL EXPLORATION N/A 05/17/2017   Procedure: LAPAROSCOPIC POSSIBLE OPEN REMOVAL OF STOMACH MASS;  Surgeon: Jackolyn Confer, MD;  [facility];  Service: General;  Laterality: N/A;   LAPAROSCOPIC NEPHRECTOMY Right 05/17/2017   Procedure: RIGHT RADICAL NEPHRECTOMY, URETERECTOMY;  Surgeon: Ardis Hughs, MD;  [facility];  Service: Urology;  Laterality: Right;   tubal preganancy      Social History:  reports that she has never smoked. She has never used smokeless tobacco. She reports that she does not drink alcohol and does not use drugs. Family History: family history includes Breast cancer in her maternal grandmother, paternal aunt, and sister; Cancer (age of onset: 34) in her mother; Cancer (age of onset: 59) in her sister; Cervical cancer in her sister; Cervical cancer (age of onset: 57) in an other family member; Colon cancer in her paternal aunt, paternal uncle, and sister; Colon cancer (age of onset: 51) in her father; Colon cancer (age of onset: 63) in her sister; Colon polyps in her sister, sister, and sister; Colon polyps (age of onset: 9)  in her son; Diabetes in her maternal grandmother and paternal grandfather; Hypertension in her father and mother; Other in her mother; Renal cancer (age of onset: 68) in her sister.   HOME MEDICATIONS: Allergies as of 10/19/2021       Reactions   Amoxicillin Anaphylaxis   All "cillins" pt was 49-41 years old  Has patient had a PCN reaction causing immediate rash, facial/tongue/throat swelling, SOB or lightheadedness with hypotension: yes Has patient had a PCN reaction causing severe  rash involving mucus membranes or skin necrosis: unknown Has patient had a PCN reaction that required hospitalization yes Has patient had a PCN reaction occurring within the last 10 years:no If all of the above answers are "NO", then may proceed with Cephalosporin use.   Ibuprofen Anaphylaxis, Hives   Keflex [cephalexin] Anaphylaxis   Penicillins Anaphylaxis   Has patient had a PCN reaction causing immediate rash, facial/tongue/throat swelling, SOB or lightheadedness with hypotension:yes Has patient had a PCN reaction causing severe rash involving mucus membranes or skin necrosis: unknown Has patient had a PCN reaction that required hospitalization yes  Has patient had a PCN reaction occurring within the last 10 years: no If all of the above answers are "NO", then may proceed with Cephalosporin use.   Baby Powder [methylbenzethonium] Hives   Doxycycline Diarrhea, Rash        Medication List        Accurate as of October 19, 2021 12:06 PM. If you have any questions, ask your nurse or doctor.          STOP taking these medications    docusate sodium 100 MG capsule Commonly known as: COLACE Stopped by: Dorita Sciara, MD   HAIR SKIN & NAILS GUMMIES PO Stopped by: Dorita Sciara, MD   Multivitamin Gummies Adult Chew Stopped by: Dorita Sciara, MD   ondansetron 4 MG tablet Commonly known as: ZOFRAN Stopped by: Dorita Sciara, MD   VITAMIN B12 PO Stopped by: Dorita Sciara, MD   VITAMIN D PO Stopped by: Dorita Sciara, MD       TAKE these medications    albuterol 108 (90 Base) MCG/ACT inhaler Commonly known as: Proventil HFA Inhale 2 puffs into the lungs every 6 (six) hours as needed for wheezing or shortness of breath.   methimazole 5 MG tablet Commonly known as: TAPAZOLE Take 1 tablet (5 mg total) by mouth daily. Started by: Dorita Sciara, MD   pantoprazole 40 MG tablet Commonly known as: PROTONIX Take 1 tablet  (40 mg total) by mouth 2 (two) times daily.          REVIEW OF SYSTEMS: A comprehensive ROS was conducted with the patient and is negative except as per HPI     OBJECTIVE:  VS: BP 118/70 (BP Location: Right Arm, Patient Position: Sitting, Cuff Size: Normal)    Pulse 83    Ht 5' (1.524 m)    Wt 195 lb (88.5 kg)    SpO2 97%    BMI 38.08 kg/m    Wt Readings from Last 3 Encounters:  10/19/21 195 lb (88.5 kg)  09/21/21 192 lb 12.8 oz (87.5 kg)  09/20/21 190 lb (86.2 kg)     EXAM: General: Pt appears well and is in NAD  Eyes: External eye exam normal without stare, lid lag or exophthalmos.  EOM intact.  PERRL.  Neck: General: Supple without adenopathy. Thyroid: Thyroid size normal.  No goiter or nodules appreciated. No thyroid bruit.  Lungs: Clear with good BS bilat with no rales, rhonchi, or wheezes  Heart: Auscultation: RRR.  Abdomen: Normoactive bowel sounds, soft, nontender, without masses or organomegaly palpable  Extremities:  BL LE: No pretibial edema normal ROM and strength.  Mental Status: Judgment, insight: Intact Orientation: Oriented to time, place, and person Mood and affect: No depression, anxiety, or agitation     DATA REVIEWED:  Latest Reference Range & Units 09/26/21 07:59 10/06/21 08:18 10/13/21 08:36  TSH 0.35 - 5.50 uIU/mL 0.00 Repeated and verified X2. (L) 0.01 (L)   Triiodothyronine,Free,Serum 2.3 - 4.2 pg/mL  4.2   T4,Free(Direct) 0.60 - 1.60 ng/dL  0.80   Thyroglobulin Ab < or = 1 IU/mL   <1  Thyroperoxidase Ab SerPl-aCnc <9 IU/mL   16 (H)      ASSESSMENT/PLAN/RECOMMENDATIONS:   Subclinical Hyperthyroidism:  -Other than her weight gain which is inconsistent with hyperthyroid the patient does have a lot of the other hypothyroid symptoms as per HPI -She is not on biotin -We discussed the differential diagnosis to include Graves' disease, autonomous thyroid nodule, or subacute thyroiditis -Patient with strong family history of thyroid disease, I  will check TR AB as well as thyroid ultrasound  - We discussed with pt the benefits of methimazole in the Tx of hyperthyroidism, as well as the possible side effects/complications of anti-thyroid drug Tx (specifically detailing the rare, but serious side effect of agranulocytosis). She was informed of need for regular thyroid function monitoring while on methimazole to ensure appropriate dosage without over-treatment. As well, we discussed the possible side effects of methimazole including the chance of rash, the small chance of liver irritation/juandice and the <=1 in 300-400 chance of sudden onset agranulocytosis.  We discussed importance of going to ED promptly (and stopping methimazole) if shewere to develop significant fever with severe sore throat of other evidence of acute infection.      We extensively discussed the various treatment options for hyperthyroidism and Graves disease including ablation therapy with radioactive iodine versus antithyroid drug treatment versus surgical therapy.  We recommended to the patient that we felt, at this time, that thionamides therapy would be most optimal.  We discussed the various possible benefits versus side effects of the various therapies.   I carefully explained to the patient that one of the consequences of I-131 ablation treatment would likely be permanent hypothyroidism which would require long-term replacement therapy with LT4.  -We also discussed the risk of cardiac arrhythmia as well as increased bone resorption with hyperthyroidism.    Medications : Start methimazole 5 mg daily    2. Weigh Gain:  -This seems to be her main issue at this time.  She is discussing starting weight loss medication through her PCP.  I explained to the patient that we do not do weight loss management in this office -I did explain to her what that with improving her thyroid function that her metabolic rate will slow down and she is at risk for gaining more weight,  but we also discussed that the benefit of treatment outweighs the risk of weight gain -I have counseled the patient about avoiding sugar sweetened beverages  Follow-up in 2 months Labs in 4 weeks    Signed electronically by: Mack Guise, MD  [facility]  [facility] [address] [place], [place] [address] Phone: [phone] FAX: [phone]   CC: Wendie Agreste, [address] [place] [address] [place] [address] Phone: [phone] Fax: [phone]  Return to Endocrinology clinic as below: Future Appointments  Date Time Provider Friona  10/31/2021 12:30 PM [facility] [place] 1 [facility]  10/31/2021 12:35 PM [facility] [place] [facility]  11/07/2021  2:45 PM [facility] [place] 5 [facility]  11/16/2021  8:30 AM [facility] None  12/14/2021  9:30 AM [place], Melanie Crazier, MD [facility] None  01/08/2022  8:00 AM Wendie Agreste, MD [facility]

## 2021-10-19 NOTE — Patient Instructions (Addendum)
We recommend that you follow these hyperthyroidism instructions at home:  1) Take Methimazole 5 mg once a day If you develop severe sore throat with high fevers OR develop unexplained yellowing of your skin, eyes, under your tongue, severe abdominal pain with nausea or vomiting --> then please get evaluated immediately.   2) Get repeat thyroid labs 4 weeks.  It is ESSENTIAL to get follow-up labs to help avoid over or undertreatment of your hyperthyroidism - both of which can be dangerous to your health.

## 2021-10-20 ENCOUNTER — Telehealth: Payer: Self-pay | Admitting: Gastroenterology

## 2021-10-20 ENCOUNTER — Ambulatory Visit
Admission: RE | Admit: 2021-10-20 | Discharge: 2021-10-20 | Disposition: A | Discharge status: Home / Self Care | Payer: 59 | Source: Ambulatory Visit | Attending: Internal Medicine | Admitting: Internal Medicine

## 2021-10-20 ENCOUNTER — Telehealth: Payer: Self-pay | Admitting: Internal Medicine

## 2021-10-20 DIAGNOSIS — E0590 Thyrotoxicosis, unspecified without thyrotoxic crisis or storm: Secondary | ICD-10-CM

## 2021-10-20 MED ORDER — PANTOPRAZOLE SODIUM 40 MG PO TBEC: 40.0000 mg | DELAYED_RELEASE_TABLET | Freq: Two times a day (BID) | ORAL | 11 refills | Status: DC

## 2021-10-20 NOTE — Telephone Encounter (Signed)
Patient called requesting a refill for her pantoprazole.  She thought it was going to be refilled following her procedure, but she is currently at the pharmacy and they do not have anything for her.  Can you please call it in for her, or if there is an issue about it, can you please call and let her know?  Thank you.

## 2021-10-20 NOTE — Telephone Encounter (Signed)
Prescription sent to patient's pharmacy. Patient notified.  

## 2021-10-20 NOTE — Telephone Encounter (Signed)
Please let the pt know that her thyroid ultrasound shows 1 nodule that does NOT need a biopsy per guidelines.    There's 2 other areas but the radiologist did not feel this was a true but the areas are tiny and will recheck in the future   In the meantime , no change in management, Continue Methimazole.    Thanks

## 2021-10-20 NOTE — Telephone Encounter (Signed)
Patient was advise on message below per provider and verbalized understanding. Will continue her Methimazole.

## 2021-10-31 ENCOUNTER — Ambulatory Visit
Admission: RE | Admit: 2021-10-31 | Discharge: 2021-10-31 | Disposition: A | Discharge status: Home / Self Care | Payer: 59 | Source: Ambulatory Visit | Attending: Family Medicine | Admitting: Family Medicine

## 2021-10-31 DIAGNOSIS — R928 Other abnormal and inconclusive findings on diagnostic imaging of breast: Secondary | ICD-10-CM

## 2021-11-07 ENCOUNTER — Other Ambulatory Visit: Payer: 59

## 2021-11-16 ENCOUNTER — Other Ambulatory Visit (INDEPENDENT_AMBULATORY_CARE_PROVIDER_SITE_OTHER): Payer: 59

## 2021-11-16 ENCOUNTER — Other Ambulatory Visit: Payer: Self-pay

## 2021-11-16 ENCOUNTER — Other Ambulatory Visit: Payer: 59

## 2021-11-16 ENCOUNTER — Telehealth: Payer: Self-pay | Admitting: Internal Medicine

## 2021-11-16 DIAGNOSIS — E0590 Thyrotoxicosis, unspecified without thyrotoxic crisis or storm: Secondary | ICD-10-CM | POA: Diagnosis not present

## 2021-11-16 LAB — CBC WITH DIFFERENTIAL/PLATELET
Basophils Absolute: 0 10*3/uL (ref 0.0–0.1)
Basophils Relative: 0.6 % (ref 0.0–3.0)
Eosinophils Absolute: 0.3 10*3/uL (ref 0.0–0.7)
Eosinophils Relative: 4 % (ref 0.0–5.0)
HCT: 43.2 % (ref 36.0–46.0)
Hemoglobin: 13.5 g/dL (ref 12.0–15.0)
Lymphocytes Relative: 26.4 % (ref 12.0–46.0)
Lymphs Abs: 1.8 10*3/uL (ref 0.7–4.0)
MCHC: 31.3 g/dL (ref 30.0–36.0)
MCV: 81.4 fl (ref 78.0–100.0)
Monocytes Absolute: 0.5 10*3/uL (ref 0.1–1.0)
Monocytes Relative: 7.6 % (ref 3.0–12.0)
Neutro Abs: 4.3 10*3/uL (ref 1.4–7.7)
Neutrophils Relative %: 61.4 % (ref 43.0–77.0)
Platelets: 327 10*3/uL (ref 150.0–400.0)
RBC: 5.31 Mil/uL — ABNORMAL HIGH (ref 3.87–5.11)
RDW: 15.3 % (ref 11.5–15.5)
WBC: 6.9 10*3/uL (ref 4.0–10.5)

## 2021-11-16 LAB — TSH: TSH: 10.41 u[IU]/mL — ABNORMAL HIGH (ref 0.35–5.50)

## 2021-11-16 LAB — COMPREHENSIVE METABOLIC PANEL
ALT: 29 U/L (ref 0–35)
AST: 17 U/L (ref 0–37)
Albumin: 4.6 g/dL (ref 3.5–5.2)
Alkaline Phosphatase: 90 U/L (ref 39–117)
BUN: 17 mg/dL (ref 6–23)
CO2: 31 mEq/L (ref 19–32)
Calcium: 9.8 mg/dL (ref 8.4–10.5)
Chloride: 102 mEq/L (ref 96–112)
Creatinine, Ser: 1.06 mg/dL (ref 0.40–1.20)
GFR: 60.23 mL/min (ref >60.00)
Glucose, Bld: 98 mg/dL (ref 70–99)
Potassium: 4.7 mEq/L (ref 3.5–5.1)
Sodium: 140 mEq/L (ref 135–145)
Total Bilirubin: 0.6 mg/dL (ref 0.2–1.2)
Total Protein: 7.1 g/dL (ref 6.0–8.3)

## 2021-11-16 LAB — T4, FREE: Free T4: 0.63 ng/dL (ref 0.60–1.60)

## 2021-11-16 NOTE — Telephone Encounter (Signed)
Patient advised and verbalized understanding. Per Dr. Kelton Pillar patient can just start taking a half tablet instead of stopping for 2 days.

## 2021-11-16 NOTE — Telephone Encounter (Signed)
Please let the patient know that her thyroid shows that the current dose of methimazole 1 tablet a day is too much for her.  Ask her not to take methimazole for 2 days then start taking HALF a tablet for days a week (for example Saturday, Monday, Wednesday, and Thursday)  Do Not take any methimazole the other 3 days   We will check on her next visit which is next month Thanks

## 2021-11-17 DIAGNOSIS — M1990 Unspecified osteoarthritis, unspecified site: Secondary | ICD-10-CM | POA: Diagnosis present

## 2021-11-19 LAB — TRAB (TSH RECEPTOR BINDING ANTIBODY): TRAB: 2.66 IU/L — ABNORMAL HIGH (ref <=2.00)

## 2021-11-20 ENCOUNTER — Telehealth: Payer: Self-pay | Admitting: Gastroenterology

## 2021-11-20 NOTE — Telephone Encounter (Signed)
Patient called requesting results from colonoscopy from November. Please advise

## 2021-11-20 NOTE — Telephone Encounter (Signed)
Patient notified of the letter results.  Copy of the letter mailed to patient.  All of her questions were answered

## 2021-11-21 DIAGNOSIS — Z0289 Encounter for other administrative examinations: Secondary | ICD-10-CM

## 2021-11-24 ENCOUNTER — Ambulatory Visit (INDEPENDENT_AMBULATORY_CARE_PROVIDER_SITE_OTHER): Payer: 59 | Admitting: Registered Nurse

## 2021-11-24 VITALS — BP 138/76 | HR 98 | Temp 97.9°F | Resp 17 | Ht 60.0 in | Wt 195.3 lb

## 2021-11-24 DIAGNOSIS — R300 Dysuria: Secondary | ICD-10-CM

## 2021-11-24 DIAGNOSIS — N3001 Acute cystitis with hematuria: Secondary | ICD-10-CM

## 2021-11-24 LAB — POCT URINALYSIS DIP (MANUAL ENTRY)
Bilirubin, UA: NEGATIVE
Glucose, UA: NEGATIVE mg/dL
Nitrite, UA: NEGATIVE
Spec Grav, UA: 1.02 (ref 1.010–1.025)
Urobilinogen, UA: 0.2 E.U./dL
pH, UA: 6 (ref 5.0–8.0)

## 2021-11-24 MED ORDER — SULFAMETHOXAZOLE-TRIMETHOPRIM 800-160 MG PO TABS: 1.0000 | ORAL_TABLET | Freq: Two times a day (BID) | ORAL | 0 refills | Status: DC

## 2021-11-24 NOTE — Patient Instructions (Signed)
Ms. Marlowe [facility] - -  Bactrim twice a day for three days  Ok to use tylenol  If fevers, chills, sweats, worsening wihtin 24 hours, see ER.  Call Monday if not resolved  Thanks,  Rich

## 2021-11-24 NOTE — Progress Notes (Signed)
Established Patient Office Visit  Subjective:  Patient ID: [name], female    DOB: 07/30/1971  Age: 51 y.o. MRN: [id number]  CC:  Chief Complaint  Patient presents with   Urinary Tract Infection    Pt reports has stong urine, side pain, started this am, pain and burining     HPI [name] presents for UTI  Malodorous urine Started this am Similar to previous UTI Starting to have some flank pain No constitutional symptoms at this time. No vaginal symptoms  Otherwise no concerns  Past Medical History:  Diagnosis Date   Arthritis    Asthma    Complication of anesthesia    respirations slow down after coming out from Anesthesia   Endometriosis    GERD (gastroesophageal reflux disease)    Mass of right kidney    Post-operative nausea and vomiting     Past Surgical History:  Procedure Laterality Date   ABDOMINAL HYSTERECTOMY     CERVICAL DISC SURGERY     CHOLECYSTECTOMY     COLONOSCOPY  12/17/2017   Dr.Stark   EUS N/A 03/14/2017   Procedure: UPPER ENDOSCOPIC ULTRASOUND (EUS) LINEAR;  Surgeon: Milus Banister, MD;  [facility];  Service: Endoscopy;  Laterality: N/A;   I & D EXTREMITY Left 05/26/2021   Procedure: IRRIGATION AND DEBRIDEMENT EXTREMITY WITH COMPLEX CLOSURE OF WOUND;  Surgeon: Meredith Pel, MD;  Location: [facility];  Service: Orthopedics;  Laterality: Left;   KNEE ARTHROSCOPY Left 05/26/2021   Procedure: ARTHROSCOPIC KNEE WASHOUT;  Surgeon: Meredith Pel, MD;  Location: [facility];  Service: Orthopedics;  Laterality: Left;   LAPAROSCOPIC ABDOMINAL EXPLORATION N/A 05/17/2017   Procedure: LAPAROSCOPIC POSSIBLE OPEN REMOVAL OF STOMACH MASS;  Surgeon: Jackolyn Confer, MD;  [facility];  Service: General;  Laterality: N/A;   LAPAROSCOPIC NEPHRECTOMY Right 05/17/2017   Procedure: RIGHT RADICAL NEPHRECTOMY, URETERECTOMY;  Surgeon: Ardis Hughs, MD;  [facility];  Service: Urology;  Laterality: Right;    tubal preganancy      Family History  Problem Relation Age of Onset   Hypertension Mother    Cancer Mother 44       "back cancer"   Other Mother        hysterectomy at 5 for "pre-cancerous cervical cells"   Hypertension Father    Colon cancer Father 57   Cervical cancer Sister        full sister dx. unspecified age   Colon polyps Sister        unspecified number   Colon polyps Sister        paternal half-sister w/ colon polyps dx. age 65 or younger - unspecified number   Colon cancer Sister 7       paternal half-sister    Colon polyps Sister        paternal half-sister; unspecified number   Colon cancer Sister        paternal half-sister dx. before age 51; has had two colon cancers   Breast cancer Sister        paternal half-sister dx under age 72   Renal cancer Sister 18       paternal half-sister dx. w/ "[facility] - lymphoid renal cancer"   Cancer Sister 40       paternal half-sister dx cancer of wrist that was not a skin cancer   Colon cancer Paternal Aunt        (x2) paternal aunts with colon cancer at unspecified ages;  lim info   Breast cancer Paternal Aunt        (x3) paternal aunts with breast cancer at unspecified ages; lim info   Colon cancer Paternal Uncle        dx. unspecified age; lim info   Breast cancer Maternal Grandmother        unspecified age   Diabetes Maternal Grandmother    Diabetes Paternal Grandfather    Colon polyps Son 46       has had a "bleeding polyp"   Cervical cancer Other 56       niece; +hysterectomy   Esophageal cancer Neg Hx    Stomach cancer Neg Hx     Social History   Socioeconomic History   Marital status: Married    Spouse name: Not on file   Number of children: Not on file   Years of education: Not on file   Highest education level: Not on file  Occupational History   Not on file  Tobacco Use   Smoking status: Never   Smokeless tobacco: Never  Vaping Use   Vaping Use: Never used  Substance and Sexual Activity    Alcohol use: No   Drug use: No   Sexual activity: Yes    Birth control/protection: Surgical  Other Topics Concern   Not on file  Social History Narrative   Not on file   Social Determinants of Health   Financial Resource Strain: Not on file  Food Insecurity: Not on file  Transportation Needs: Not on file  Physical Activity: Not on file  Stress: Not on file  Social Connections: Not on file  Intimate Partner Violence: Not on file    Outpatient Medications Prior to Visit  Medication Sig Dispense Refill   albuterol (PROVENTIL HFA) 108 (90 Base) MCG/ACT inhaler Inhale 2 puffs into the lungs every 6 (six) hours as needed for wheezing or shortness of breath. 18 g 0   methimazole (TAPAZOLE) 5 MG tablet Take 1 tablet (5 mg total) by mouth daily. 90 tablet 1   pantoprazole (PROTONIX) 40 MG tablet Take 1 tablet (40 mg total) by mouth 2 (two) times daily. 60 tablet 11   Facility-Administered Medications Prior to Visit  Medication Dose Route Frequency Provider Last Rate Last Admin   ipratropium-albuterol (DUONEB) 0.5-2.5 (3) MG/3ML nebulizer solution 3 mL  3 mL Nebulization Q6H Nafziger, Cory, NP        Allergies  Allergen Reactions   Amoxicillin Anaphylaxis    All "cillins" pt was 57-7 years old  Has patient had a PCN reaction causing immediate rash, facial/tongue/throat swelling, SOB or lightheadedness with hypotension: yes Has patient had a PCN reaction causing severe rash involving mucus membranes or skin necrosis: unknown Has patient had a PCN reaction that required hospitalization yes Has patient had a PCN reaction occurring within the last 10 years:no If all of the above answers are "NO", then may proceed with Cephalosporin use.    Ibuprofen Anaphylaxis and Hives   Keflex [Cephalexin] Anaphylaxis   Penicillins Anaphylaxis    Has patient had a PCN reaction causing immediate rash, facial/tongue/throat swelling, SOB or lightheadedness with hypotension:yes Has patient had a PCN  reaction causing severe rash involving mucus membranes or skin necrosis: unknown Has patient had a PCN reaction that required hospitalization yes  Has patient had a PCN reaction occurring within the last 10 years: no If all of the above answers are "NO", then may proceed with Cephalosporin use.    Baby Powder [Methylbenzethonium] Hives  Doxycycline Diarrhea and Rash    ROS Review of Systems  Per hpi     Objective:    Physical Exam Vitals and nursing note reviewed.  Constitutional:      General: She is not in acute distress.    Appearance: Normal appearance. She is normal weight. She is not ill-appearing, toxic-appearing or diaphoretic.  Cardiovascular:     Rate and Rhythm: Normal rate and regular rhythm.     Heart sounds: Normal heart sounds. No murmur heard.   No friction rub. No gallop.  Pulmonary:     Effort: Pulmonary effort is normal. No respiratory distress.     Breath sounds: Normal breath sounds. No stridor. No wheezing, rhonchi or rales.  Chest:     Chest wall: No tenderness.  Abdominal:     Tenderness: There is right CVA tenderness and left CVA tenderness.  Skin:    General: Skin is warm and dry.  Neurological:     General: No focal deficit present.     Mental Status: She is alert and oriented to person, place, and time. Mental status is at baseline.  Psychiatric:        Mood and Affect: Mood normal.        Behavior: Behavior normal.        Thought Content: Thought content normal.        Judgment: Judgment normal.    BP 138/76    Pulse 98    Temp 97.9 F (36.6 C) (Temporal)    Resp 17    Ht 5' (1.524 m)    Wt 195 lb 4.8 oz (88.6 kg)    SpO2 97%    BMI 38.14 kg/m  Wt Readings from Last 3 Encounters:  11/24/21 195 lb 4.8 oz (88.6 kg)  10/19/21 195 lb (88.5 kg)  09/21/21 192 lb 12.8 oz (87.5 kg)     Health Maintenance Due  Topic Date Due   COVID-19 Vaccine (1) Never done   FOOT EXAM  Never done   OPHTHALMOLOGY EXAM  Never done   URINE MICROALBUMIN   Never done   PAP SMEAR-Modifier  01/24/2019    There are no preventive care reminders to display for this patient.  Lab Results  Component Value Date   TSH 10.41 (H) 11/16/2021   Lab Results  Component Value Date   WBC 6.9 11/16/2021   HGB 13.5 11/16/2021   HCT 43.2 11/16/2021   MCV 81.4 11/16/2021   PLT 327.0 11/16/2021   Lab Results  Component Value Date   NA 140 11/16/2021   K 4.7 11/16/2021   CO2 31 11/16/2021   GLUCOSE 98 11/16/2021   BUN 17 11/16/2021   CREATININE 1.06 11/16/2021   BILITOT 0.6 11/16/2021   ALKPHOS 90 11/16/2021   AST 17 11/16/2021   ALT 29 11/16/2021   PROT 7.1 11/16/2021   ALBUMIN 4.6 11/16/2021   CALCIUM 9.8 11/16/2021   ANIONGAP 7 11/05/2019   GFR 60.23 11/16/2021   Lab Results  Component Value Date   CHOL 203 (H) 09/26/2021   Lab Results  Component Value Date   HDL 47.20 09/26/2021   Lab Results  Component Value Date   LDLCALC 129 (H) 09/26/2021   Lab Results  Component Value Date   TRIG 135.0 09/26/2021   Lab Results  Component Value Date   CHOLHDL 4 09/26/2021   Lab Results  Component Value Date   HGBA1C 6.3 09/26/2021      Assessment & Plan:   Problem List  Items Addressed This Visit   None Visit Diagnoses     Dysuria    -  Primary   Relevant Orders   POCT urinalysis dipstick (Completed)   Acute cystitis with hematuria       Relevant Medications   sulfamethoxazole-trimethoprim (BACTRIM DS) 800-160 MG tablet   Other Relevant Orders   Urine Culture       Meds ordered this encounter  Medications   sulfamethoxazole-trimethoprim (BACTRIM DS) 800-160 MG tablet    Sig: Take 1 tablet by mouth 2 (two) times daily.    Dispense:  6 tablet    Refill:  0    Order Specific Question:   Supervising Provider    Answer:   Carlota Raspberry, JEFFREY R [[id number]]    Follow-up: No follow-ups on file.   PLAN Suspect UTI based on poct UA. Will treat as above Urine culture sent. Follow up as warranted Discussed ER precautions - if  worsening symptoms or constitutional symptoms, pt should proceed to ER for IV abx. Patient encouraged to call clinic with any questions, comments, or concerns.  Maximiano Coss, NP

## 2021-11-26 LAB — URINE CULTURE
MICRO NUMBER:: 12901085
SPECIMEN QUALITY:: ADEQUATE

## 2021-11-27 ENCOUNTER — Telehealth: Payer: Self-pay

## 2021-11-27 ENCOUNTER — Other Ambulatory Visit: Payer: Self-pay

## 2021-11-27 ENCOUNTER — Other Ambulatory Visit: Payer: Self-pay | Admitting: Registered Nurse

## 2021-11-27 DIAGNOSIS — N3001 Acute cystitis with hematuria: Secondary | ICD-10-CM

## 2021-11-27 MED ORDER — NITROFURANTOIN MONOHYD MACRO 100 MG PO CAPS: 100.0000 mg | ORAL_CAPSULE | Freq: Two times a day (BID) | ORAL | 0 refills | Status: DC

## 2021-11-27 MED ORDER — NITROFURANTOIN MONOHYD MACRO 100 MG PO CAPS: 100.0000 mg | ORAL_CAPSULE | Freq: Two times a day (BID) | ORAL | 0 refills | Status: AC

## 2021-11-27 NOTE — Telephone Encounter (Signed)
Urine culture showed resistance to bactrim  I have sent macrobid twice daily x 5 days  Thanks,  Denice Paradise

## 2021-11-27 NOTE — Telephone Encounter (Signed)
Pt aware and I did [facility] per her request

## 2021-11-27 NOTE — Telephone Encounter (Signed)
Caller name: Tarry Blayney [facility]   On DPR? :Yes  Call back number:[phone]  Provider they see: Delfino Lovett  Reason for call:Seen [name] for UTI took all that medication and drank case of water herself this past weekend and is not improving and wants to know if something else can be called in?   [facility] meds sent to

## 2021-11-29 ENCOUNTER — Encounter (INDEPENDENT_AMBULATORY_CARE_PROVIDER_SITE_OTHER): Payer: Self-pay | Admitting: Family Medicine

## 2021-11-29 ENCOUNTER — Telehealth: Payer: Self-pay

## 2021-11-29 ENCOUNTER — Ambulatory Visit (INDEPENDENT_AMBULATORY_CARE_PROVIDER_SITE_OTHER): Payer: 59 | Admitting: Family Medicine

## 2021-11-29 ENCOUNTER — Other Ambulatory Visit: Payer: Self-pay

## 2021-11-29 VITALS — BP 137/77 | HR 68 | Temp 97.8°F | Ht 61.0 in | Wt 190.0 lb

## 2021-11-29 DIAGNOSIS — R5383 Other fatigue: Secondary | ICD-10-CM

## 2021-11-29 DIAGNOSIS — Z6836 Body mass index (BMI) 36.0-36.9, adult: Secondary | ICD-10-CM

## 2021-11-29 DIAGNOSIS — R7303 Prediabetes: Secondary | ICD-10-CM

## 2021-11-29 DIAGNOSIS — E7849 Other hyperlipidemia: Secondary | ICD-10-CM

## 2021-11-29 DIAGNOSIS — R4589 Other symptoms and signs involving emotional state: Secondary | ICD-10-CM

## 2021-11-29 DIAGNOSIS — N261 Atrophy of kidney (terminal): Secondary | ICD-10-CM

## 2021-11-29 DIAGNOSIS — R69 Illness, unspecified: Secondary | ICD-10-CM | POA: Diagnosis not present

## 2021-11-29 DIAGNOSIS — Z905 Acquired absence of kidney: Secondary | ICD-10-CM

## 2021-11-29 DIAGNOSIS — E0590 Thyrotoxicosis, unspecified without thyrotoxic crisis or storm: Secondary | ICD-10-CM

## 2021-11-29 DIAGNOSIS — E669 Obesity, unspecified: Secondary | ICD-10-CM | POA: Diagnosis not present

## 2021-11-29 DIAGNOSIS — R0602 Shortness of breath: Secondary | ICD-10-CM | POA: Diagnosis not present

## 2021-11-29 DIAGNOSIS — Z9189 Other specified personal risk factors, not elsewhere classified: Secondary | ICD-10-CM

## 2021-11-29 NOTE — Telephone Encounter (Signed)
Patient would like to know if she should come into the office and get a kidney function test done being that she has one kidney. Please advise

## 2021-11-29 NOTE — Telephone Encounter (Signed)
As long as symptoms are improving and no new symptoms are arising, my concern for renal function is very low given her most recent available labs.   That said, if she'd like peace of mind, we can place future order for BMP and I will cosign  Thanks,  Denice Paradise

## 2021-11-29 NOTE — Telephone Encounter (Signed)
Caller name:Akshara Garcia-Hill   On DPR? :Yes  Call back number:[phone]  Provider they see: Richard Reason for call:Pt wants call back about Uti lab work she only has one kidney and wants to touch base

## 2021-11-30 ENCOUNTER — Other Ambulatory Visit: Payer: 59

## 2021-11-30 ENCOUNTER — Other Ambulatory Visit (INDEPENDENT_AMBULATORY_CARE_PROVIDER_SITE_OTHER): Payer: 59

## 2021-11-30 DIAGNOSIS — N261 Atrophy of kidney (terminal): Secondary | ICD-10-CM

## 2021-11-30 LAB — BASIC METABOLIC PANEL
BUN: 16 mg/dL (ref 6–23)
CO2: 29 mEq/L (ref 19–32)
Calcium: 10.3 mg/dL (ref 8.4–10.5)
Chloride: 100 mEq/L (ref 96–112)
Creatinine, Ser: 0.92 mg/dL (ref 0.40–1.20)
GFR: 71.38 mL/min (ref >60.00)
Glucose, Bld: 92 mg/dL (ref 70–99)
Potassium: 4.7 mEq/L (ref 3.5–5.1)
Sodium: 138 mEq/L (ref 135–145)

## 2021-11-30 LAB — VITAMIN D 25 HYDROXY (VIT D DEFICIENCY, FRACTURES): Vit D, 25-Hydroxy: 30.3 ng/mL (ref 30.0–100.0)

## 2021-11-30 LAB — FOLATE: Folate: 3.6 ng/mL (ref >3.0)

## 2021-11-30 LAB — VITAMIN B12: Vitamin B-12: 742 pg/mL (ref 232–1245)

## 2021-11-30 NOTE — Telephone Encounter (Signed)
Patient is scheduled for today for a lab only Visit.

## 2021-11-30 NOTE — Progress Notes (Signed)
Can call patient - kidney function is entirely normal, no concerns at all  Thanks,  Denice Paradise

## 2021-11-30 NOTE — Progress Notes (Signed)
Called patient 11/29/2021 and discussed lab work.

## 2021-12-04 NOTE — Progress Notes (Signed)
Chief Complaint:   OBESITY [name] (MR# [id number]) is a 51 y.o. female who presents for evaluation and treatment of obesity and related comorbidities. Current Body mass index is 35.9 kg/m. [name] has been struggling with her weight for many years and has been unsuccessful in either losing weight, maintaining weight loss, or reaching her healthy weight goal.  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 No kids in the home. She craves "food", and she snacks on junk and sweets. She drinks juice and other calorie beverages. She had labs done with other doctors recently, so there is no need for a full panel today.  [name] is currently in the action stage of change and ready to dedicate time achieving and maintaining a healthier weight. [name] is interested in becoming our patient and working on intensive lifestyle modifications including (but not limited to) diet and exercise for weight loss.  [name]'s habits were reviewed today and are as follows: Her family eats meals together, her desired weight loss is 85 lbs, she started gaining weight 10 years ago, her heaviest weight ever was 198 pounds, she is a picky eater and doesn't like to eat healthier foods, she has significant food cravings issues, she snacks frequently in the evenings, she skips meals frequently, she is frequently drinking liquids with calories, she frequently makes poor food choices, she frequently eats larger portions than normal, and she struggles with emotional eating.  Depression Screen [name]'s Food and Mood (modified PHQ-9) score was 13.  Depression screen PHQ 2/9 11/29/2021  Decreased Interest 1  Down, Depressed, Hopeless 1  PHQ - 2 Score 2  Altered sleeping 3  Tired, decreased energy 3  Change in appetite 1  Feeling bad or failure about yourself  3  Trouble concentrating 1  Moving slowly or fidgety/restless 0  Suicidal thoughts 0  PHQ-9 Score 13  Difficult doing work/chores  Not difficult at all   Subjective:   1. Other fatigue [name] admits to daytime somnolence and admits to waking up still tired. Patent has a history of symptoms of daytime fatigue, morning fatigue, and morning headache. [name] generally gets 5 hours of sleep per night, and states that she has difficulty falling asleep. Snoring is present. Apneic episodes are not present. Epworth Sleepiness Score is 1.  2. Shortness of breath on exertion [name] notes increasing shortness of breath with exercising and seems to be worsening over time with weight gain. She notes getting out of breath sooner with activity than she used to. This has not gotten worse recently. [name] denies shortness of breath at rest or orthopnea.  3. Hyperthyroidism [name] sees [name] of Endocrinology. She is on methimazole now. Her symptoms are stable currently.  4. Other hyperlipidemia [name] last LDL was elevated on her last lab check 2 months ago. She is not on medications currently. Labs were reviewed.  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 Her body creates benign masses. She sees Dr. Louis [name] at [facility] regularly.  53. Pre-diabetes [name] has a diagnosis of pre-diabetes based on her elevated HgA1c at 6.3 two months ago. She was informed this puts her at greater risk of developing diabetes. She continues to work on diet and exercise to decrease her risk of diabetes. She denies nausea or hypoglycemia.  7. Depressed mood with emotional eating [name]'s PHQ-9 score is 13.  She denies a history of depression, and she is not on medications. She has episodes where she eats as a reward, or because of stress or boredom. She notes she is tried of feeling miserable from eating poorly.  8. At risk for diabetes mellitus [name] is at higher than average risk for developing diabetes due to A1c at 6.3 recently.   Assessment/Plan:   Orders Placed This Encounter   Procedures   Vitamin B12   Folate   VITAMIN D 25 Hydroxy (Vit-D Deficiency, Fractures)   EKG 12-Lead    Medications Discontinued During This Encounter  Medication Reason   ipratropium-albuterol (DUONEB) 0.5-2.5 (3) MG/3ML nebulizer solution 3 mL    sulfamethoxazole-trimethoprim (BACTRIM DS) 800-160 MG tablet      No orders of the defined types were placed in this encounter.    1. Other fatigue [name] does feel that her weight is causing her energy to be lower than it should be. Fatigue may be related to obesity, depression or many other causes. Labs will be ordered, and in the meanwhile, [name] will focus on self care including making healthy food choices, increasing physical activity and focusing on stress reduction.  - Vitamin B12 - EKG 12-Lead - Folate - VITAMIN D 25 Hydroxy (Vit-D Deficiency, Fractures)  2. Shortness of breath on exertion [name] does feel that she gets out of breath more easily that she used to when she exercises. [name]'s shortness of breath appears to be obesity related and exercise induced. She has agreed to work on weight loss and gradually increase exercise to treat her exercise induced shortness of breath. Will continue to monitor closely.  3. Hyperthyroidism [name] will continue with care per her Endocrinologist and labs per her Endocrinologist. They were recently done and reviewed.   4. Other hyperlipidemia Cardiovascular risk and specific lipid/LDL goals reviewed.  We discussed several lifestyle modifications today and [name] will continue to work on diet, exercise and weight loss efforts. Orders and follow up as documented in patient record.   Counseling Intensive lifestyle modifications are the first line treatment for this issue. Dietary changes: Increase soluble fiber. Decrease simple carbohydrates. Exercise changes: Moderate to vigorous-intensity aerobic activity 150 minutes per week if tolerated. Lipid-lowering medications: see documented in medical  record.  5. H/O right nephrectomy [name]'s serum creatinine is stable and within normal limits <2 months ago. Will continue to monitor.  6. Pre-diabetes [name] will start her prudent nutritional plan and weight loss. We will consider medications in the future as needed based on her concerns while on the meal plan. Education was provided on her diagnosis.  7. Depressed mood with emotional eating [name] declines a referral to see Dr. Luana Shu, our Bariatric Psychologist. She doesn't feel that she needs counseling. We will follow closely.  8. At risk for diabetes mellitus [name] was given approximately 23 minutes of diabetes education and counseling today. We discussed intensive lifestyle modifications today with an emphasis on weight loss as well as increasing exercise and decreasing simple carbohydrates in her diet. We also reviewed medication options with an emphasis on risk versus benefit of those discussed.    Repetitive spaced learning was employed today to elicit superior memory formation and behavioral change.  9. Obesity with current BMI of 36.0 [name] is currently in the action stage of change and her goal is to continue with weight loss efforts. I recommend [name] begin the structured treatment plan as follows:  She has agreed to the Category 1 Plan.  Exercise goals: As is.  Behavioral modification strategies: increasing lean protein intake and decreasing simple carbohydrates.  She was informed of the importance of frequent follow-up visits to maximize her success with intensive lifestyle modifications for her multiple health conditions. She was informed we would discuss her lab results at her next visit unless there is a critical issue that needs to be addressed sooner. [name] agreed to keep her next visit at the agreed upon time to discuss these results.  Objective:   Blood pressure 137/77, pulse 68, temperature 97.8 F (36.6 C), height 5\' 1"  (1.549 m), weight 190 lb (86.2 kg), SpO2 97 %.  Body mass index is 35.9 kg/m.  EKG: Normal sinus rhythm, rate 72 BPM.  Indirect Calorimeter completed today shows a VO2 of 202 and a REE of 1397.  Her calculated basal metabolic rate is 2620 thus her basal metabolic rate is worse than expected.  General: Cooperative, alert, well developed, in no acute distress. HEENT: Conjunctivae and lids unremarkable. Cardiovascular: Regular rhythm.  Lungs: Normal work of breathing. Neurologic: No focal deficits.   Lab Results  Component Value Date   CREATININE 0.92 11/30/2021   BUN 16 11/30/2021   NA 138 11/30/2021   K 4.7 11/30/2021   CL 100 11/30/2021   CO2 29 11/30/2021   Lab Results  Component Value Date   ALT 29 11/16/2021   AST 17 11/16/2021   ALKPHOS 90 11/16/2021   BILITOT 0.6 11/16/2021   Lab Results  Component Value Date   HGBA1C 6.3 09/26/2021   No results found for: INSULIN Lab Results  Component Value Date   TSH 10.41 (H) 11/16/2021   Lab Results  Component Value Date   CHOL 203 (H) 09/26/2021   HDL 47.20 09/26/2021   LDLCALC 129 (H) 09/26/2021   TRIG 135.0 09/26/2021   CHOLHDL 4 09/26/2021   Lab Results  Component Value Date   WBC 6.9 11/16/2021   HGB 13.5 11/16/2021   HCT 43.2 11/16/2021   MCV 81.4 11/16/2021   PLT 327.0 11/16/2021   No results found for: IRON, TIBC, FERRITIN  Attestation Statements:   Reviewed by clinician on day of visit: allergies, medications, problem list, medical history, surgical history, family history, social history, and previous encounter notes.   Wilhemena Durie, am acting as transcriptionist for [employer], DO.  I have reviewed the above documentation for accuracy and completeness, and I agree with the above. [name], D.O.  The [place] was signed into law in 2016 which includes the topic of electronic health records.  This provides immediate access to information in MyChart.  This includes consultation notes, operative notes, office notes,  lab results and pathology reports.  If you have any questions about what you read please let us know at your next visit so we can discuss your concerns and take corrective action if need be.  We are right here with you.

## 2021-12-05 DIAGNOSIS — N3000 Acute cystitis without hematuria: Secondary | ICD-10-CM | POA: Diagnosis not present

## 2021-12-13 ENCOUNTER — Ambulatory Visit (INDEPENDENT_AMBULATORY_CARE_PROVIDER_SITE_OTHER): Payer: 59 | Admitting: Family Medicine

## 2021-12-13 ENCOUNTER — Encounter (INDEPENDENT_AMBULATORY_CARE_PROVIDER_SITE_OTHER): Payer: Self-pay | Admitting: Family Medicine

## 2021-12-13 ENCOUNTER — Other Ambulatory Visit: Payer: Self-pay

## 2021-12-13 VITALS — BP 122/77 | HR 65 | Temp 97.9°F | Ht 61.0 in | Wt 192.0 lb

## 2021-12-13 DIAGNOSIS — Z9189 Other specified personal risk factors, not elsewhere classified: Secondary | ICD-10-CM | POA: Diagnosis not present

## 2021-12-13 DIAGNOSIS — K760 Fatty (change of) liver, not elsewhere classified: Secondary | ICD-10-CM

## 2021-12-13 DIAGNOSIS — E559 Vitamin D deficiency, unspecified: Secondary | ICD-10-CM

## 2021-12-13 DIAGNOSIS — E7849 Other hyperlipidemia: Secondary | ICD-10-CM | POA: Diagnosis not present

## 2021-12-13 DIAGNOSIS — E0590 Thyrotoxicosis, unspecified without thyrotoxic crisis or storm: Secondary | ICD-10-CM | POA: Diagnosis not present

## 2021-12-13 DIAGNOSIS — R7303 Prediabetes: Secondary | ICD-10-CM

## 2021-12-13 DIAGNOSIS — Z6836 Body mass index (BMI) 36.0-36.9, adult: Secondary | ICD-10-CM

## 2021-12-13 MED ORDER — VITAMIN D3 25 MCG (1000 UT) PO CAPS
2000.0000 [IU] | ORAL_CAPSULE | Freq: Every day | ORAL | Status: DC
Start: 2021-12-13 — End: 2021-12-15

## 2021-12-13 NOTE — Patient Instructions (Signed)
The 10-year ASCVD risk score (Arnett DK, et al., 2019) is: 3.2%   Values used to calculate the score:     Age: 51 years     Sex: Female     Is Non-Hispanic African American: No     Diabetic: Yes     Tobacco smoker: No     Systolic Blood Pressure: 573 mmHg     Is BP treated: No     HDL Cholesterol: 47.2 mg/dL     Total Cholesterol: 203 mg/dL

## 2021-12-14 ENCOUNTER — Ambulatory Visit: Payer: 59 | Admitting: Family Medicine

## 2021-12-14 ENCOUNTER — Telehealth (INDEPENDENT_AMBULATORY_CARE_PROVIDER_SITE_OTHER): Payer: Self-pay

## 2021-12-14 ENCOUNTER — Ambulatory Visit (INDEPENDENT_AMBULATORY_CARE_PROVIDER_SITE_OTHER): Payer: 59 | Admitting: Internal Medicine

## 2021-12-14 ENCOUNTER — Encounter: Payer: Self-pay | Admitting: Internal Medicine

## 2021-12-14 ENCOUNTER — Telehealth: Payer: Self-pay | Admitting: Internal Medicine

## 2021-12-14 VITALS — BP 140/80 | HR 78 | Ht 61.0 in | Wt 196.4 lb

## 2021-12-14 DIAGNOSIS — E042 Nontoxic multinodular goiter: Secondary | ICD-10-CM | POA: Diagnosis not present

## 2021-12-14 DIAGNOSIS — N3000 Acute cystitis without hematuria: Secondary | ICD-10-CM | POA: Diagnosis not present

## 2021-12-14 DIAGNOSIS — E0590 Thyrotoxicosis, unspecified without thyrotoxic crisis or storm: Secondary | ICD-10-CM | POA: Diagnosis not present

## 2021-12-14 DIAGNOSIS — E0500 Thyrotoxicosis with diffuse goiter without thyrotoxic crisis or storm: Secondary | ICD-10-CM

## 2021-12-14 LAB — T4, FREE: Free T4: 0.61 ng/dL (ref 0.60–1.60)

## 2021-12-14 LAB — TSH: TSH: 2.74 u[IU]/mL (ref 0.35–5.50)

## 2021-12-14 MED ORDER — METHIMAZOLE 5 MG PO TABS: 2.5000 mg | ORAL_TABLET | ORAL | 2 refills | Status: DC

## 2021-12-14 NOTE — Telephone Encounter (Signed)
Please let the patient know that her thyroid function is normal at this time which means the current dose of methimazole is appropriate     She is currently taking half a tablet of methimazole 4 days a week, but asked her to space them out through the week  (for example take 1 on Monday skip Tuesday and take the other 1 on Wednesday etc.) right now she is clamping them up together Monday through Thursday so she is not feeling good when she is not taking anything     Thanks

## 2021-12-14 NOTE — Telephone Encounter (Signed)
Patient advised and verbalized understanding 

## 2021-12-14 NOTE — Telephone Encounter (Signed)
Pt called in and stated that she needs a refill on her Vitamin D sent to the [facility]. The pt would like a call back when the medication has been sent in. Please advise

## 2021-12-14 NOTE — Patient Instructions (Addendum)
-   Labs today and in 6 weeks
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
## 2021-12-15 ENCOUNTER — Telehealth: Payer: Self-pay | Admitting: Family Medicine

## 2021-12-15 DIAGNOSIS — E559 Vitamin D deficiency, unspecified: Secondary | ICD-10-CM

## 2021-12-15 MED ORDER — VITAMIN D3 25 MCG (1000 UT) PO CAPS: 2000.0000 [IU] | ORAL_CAPSULE | Freq: Every day | ORAL | 5 refills | Status: DC

## 2021-12-15 NOTE — Telephone Encounter (Signed)
Called pt, no answer VM not set up

## 2021-12-15 NOTE — Telephone Encounter (Signed)
Pt called in asking if Dr. Carlota Raspberry could call in a script for Vitamin D ? She would like this sent to the [facility] on [address]. The pt would like a call back when the medication has been sent in. Please advise

## 2021-12-15 NOTE — Telephone Encounter (Signed)
I am okay with refilling the vitamin D, sent in the current doses on her medication record.  Let me know if there are questions.

## 2021-12-15 NOTE — Telephone Encounter (Signed)
Previous Vitamin D sent in by Dr Raliegh Scarlet, does pt need to reach out to them for refills or are you comfortable filling this?

## 2021-12-18 ENCOUNTER — Other Ambulatory Visit: Payer: Self-pay

## 2021-12-18 ENCOUNTER — Telehealth: Payer: Self-pay

## 2021-12-18 DIAGNOSIS — E559 Vitamin D deficiency, unspecified: Secondary | ICD-10-CM

## 2021-12-18 MED ORDER — VITAMIN D3 25 MCG (1000 UT) PO CAPS: 2000.0000 [IU] | ORAL_CAPSULE | Freq: Every day | ORAL | 5 refills | Status: DC

## 2021-12-18 NOTE — Telephone Encounter (Signed)
Patient called to check status of vitamin D prescription.

## 2021-12-18 NOTE — Telephone Encounter (Signed)
Pt's PCP office sent RX to pharmacy already today.

## 2021-12-18 NOTE — Telephone Encounter (Signed)
Caller name:Chinelo Garcia-Hill   On DPR? :Yes  Call back number:[phone]  Provider they see: Carlota Raspberry  Reason for call:Pt is calling Dr [facility]  office saying that she is needing a fluid pill to go through primary care doctor where the pt is swelling

## 2021-12-18 NOTE — Telephone Encounter (Signed)
Caller name:Emelie [place]   On DPR? :Yes  Call back number:[phone]  Provider they see: Carlota Raspberry   Reason for call:Pt is calling she seen her health and wellness provider and had blood work done Vitamin D is low she is to take 2000 mg daily and the provider is out of town now and wants to know if Dr Carlota Raspberry can call her Vitamin D into the pharmacy blood work is on file.  Pt uses [facility]

## 2021-12-18 NOTE — Telephone Encounter (Signed)
Pt provider from [facility] has asked that you prescribe a fluid pill for the patients swelling.  Please advise, do not see any current rx appears would be new script

## 2021-12-18 NOTE — Telephone Encounter (Signed)
Pt is aware the vitamin d was sent to the pharmacy

## 2021-12-19 NOTE — Progress Notes (Signed)
Chief Complaint:   OBESITY [name] is here to discuss her progress with her obesity treatment plan along with follow-up of her obesity related diagnoses. [name] is on the Category 1 Plan and states she is following her eating plan approximately 98% of the time. [name] states she is active while cleaning the garage, and walking for 20 minutes 7 times per week.  Today's visit was #: 2 Starting weight: 190 lbs Starting date: 11/29/2021 Today's weight: 192 lbs Today's date: 12/13/2021 Total lbs lost to date: 0 Total lbs lost since last in-office visit: 0  Interim History: [name] is here today for her first follow-up office visit since starting the program with [place]. All blood work/ lab tests that were recently ordered by myself or an outside provider were reviewed with patient today per their request. Extended time was spent counseling her on all new disease processes that were discovered or preexisting ones that are affected by BMI. She understands that many of these abnormalities will need to monitored regularly along with the current treatment plan of prudent dietary changes, in which we are making each and every office visit, to improve these health parameters. [name] denies hunger because she drank water and it went away. She did not weight her protein and only had 2 slices of ham on her sandwich (thin slices). She denies cravings. She is not measuring veggies. She drinks 1 gallon of water per day and she does great with that.  Subjective:   1. Hyperthyroidism [name]'s TSH is 10.0. Dr. Kelton Pillar is her Endocrinologist, and per the patient she was told that she needs "the weight loss shot" or she wont lose weight and can't due to her thyroid condition. I discussed labs with the patient today.  2. Other hyperlipidemia [name]'s LDL is elevated at 129. Patient denies myalgias. I discussed labs with the patient today.   The 10-year ASCVD risk score (Arnett DK, et al., 2019) is: 4.1%    Values used to calculate the score:     Age: 51 years     Sex: Female     Is Non-Hispanic African American: No     Diabetic: Yes     Tobacco smoker: No     Systolic Blood Pressure: 706 mmHg     Is BP treated: No     HDL Cholesterol: 47.2 mg/dL     Total Cholesterol: 203 mg/dL  3. Pre-diabetes [name] denies hunger or cravings. She is not on medications currently. She has vastly inadequate protein intake currently. I discussed labs with the patient today.  4. NAFLD (nonalcoholic fatty liver disease) [name] denies ETOH intake. She has no prior imaging to show that she had hepatic steatosis (fatty liver).    5. Vitamin D deficiency [name] used to be on Vit D supplementation in the past, but none for many months. I discussed labs with the patient today.  6. At risk for heart disease [name] is at higher than average risk for cardiovascular disease due to obesity.    Assessment/Plan:    1. Hyperthyroidism [name] will continue treatment per her Endocrinologist regarding thyroid diagnosis, which they are actively managing per the patient. She is asymptomatic currently but she states due to her thyroids she can't lose weight.   2. Other hyperlipidemia Cardiovascular risk and specific lipid/LDL goals reviewed.  We discussed several lifestyle modifications today. No need for medication at this time. [name] will continue to work on diet, exercise and weight loss efforts. Orders and follow up  as documented in patient record.   Counseling Intensive lifestyle modifications are the first line treatment for this issue. Dietary changes: Increase soluble fiber. Decrease simple carbohydrates. Exercise changes: Moderate to vigorous-intensity aerobic activity 150 minutes per week if tolerated. Lipid-lowering medications: see documented in medical record.  3. Pre-diabetes I had a long discussion with the patient regarding hormones that regulate hunger and cravings, and what certain food does to stimulate or  inhibit those hormones. [name] will continue her prudent nutritional plan, but she is to eat all of the food on the plan first. We will consider medications in the future.  4. NAFLD (nonalcoholic fatty liver disease) We discussed the likely diagnosis of non-alcoholic fatty liver disease today and how this condition is obesity related. [name] was educated the importance of weight loss. [name] agreed to continue with her weight loss efforts with healthier diet and exercise as an essential part of her treatment plan.  5. Vitamin D deficiency [name]'s Vit D level is not at goal, and she agreed to start OTC Vitamin D3 2,000 IU daily.   - Discussed importance of vitamin D to their health and well-being.  - possible symptoms of low Vitamin D can be low energy, depressed mood, muscle aches, joint aches, osteoporosis etc. - low Vitamin D levels may be linked to an increased risk of cardiovascular events and even increased risk of cancers- such as colon and breast.  - I recommend pt take a 2,000 IU D3 OTC. - Informed patient this may be a lifelong thing, and she was encouraged to continue to take the medicine until told otherwise.   - we will need to monitor levels regularly (every 3-4 mo on average) to keep levels within normal limits.  - weight loss will likely improve availability of vitamin D, thus encouraged [name] to continue with meal plan and their weight loss efforts to further improve this condition - pt's questions and concerns regarding this condition addressed.  6. At risk for heart disease Due to [name]'s current state of health and medical condition(s), she is at a higher risk for heart disease.  This puts the patient at much greater risk to subsequently develop cardiopulmonary conditions that can significantly affect patient's quality of life in a negative manner.    At least 23 minutes were spent on counseling [name] about these concerns today, and I stressed the importance of reversing risks factors of  obesity, especially truncal and visceral fat, hypertension, hyperlipidemia, and pre-diabetes.  The initial goal is to lose at least 5-10% of starting weight to help reduce these risk factors.  Counseling:  Intensive lifestyle modifications were discussed with [name] as the most appropriate first line of treatment.  she will continue to work on diet, exercise, and weight loss efforts.  We will continue to reassess these conditions on a fairly regular basis in an attempt to decrease the patient's overall morbidity and mortality.  Evidence-based interventions for health behavior change were utilized today including the discussion of self monitoring techniques, problem-solving barriers, and SMART goal setting techniques.  Specifically, regarding patient's less desirable eating habits and patterns, we employed the technique of small changes when [name] has not been able to fully commit to her prudent nutritional plan.  7. Obesity with current BMI of 36.3 [name] is currently in the action stage of change. As such, her goal is to continue with weight loss efforts. She has agreed to the Category 1 Plan.   Exercise goals: As is.  Behavioral modification strategies: increasing lean  protein intake, decreasing simple carbohydrates, and planning for success.  [name] has agreed to follow-up with our clinic in 2 weeks. She was informed of the importance of frequent follow-up visits to maximize her success with intensive lifestyle modifications for her multiple health conditions.     Objective:   Blood pressure 122/77, pulse 65, temperature 97.9 F (36.6 C), height 5\' 1"  (1.549 m), weight 192 lb (87.1 kg), SpO2 98 %. Body mass index is 36.28 kg/m.  General: Cooperative, alert, well developed, in no acute distress. HEENT: Conjunctivae and lids unremarkable. Cardiovascular: Regular rhythm.  Lungs: Normal work of breathing. Neurologic: No focal deficits.   Lab Results  Component Value Date   CREATININE 0.92  11/30/2021   BUN 16 11/30/2021   NA 138 11/30/2021   K 4.7 11/30/2021   CL 100 11/30/2021   CO2 29 11/30/2021   Lab Results  Component Value Date   ALT 29 11/16/2021   AST 17 11/16/2021   ALKPHOS 90 11/16/2021   BILITOT 0.6 11/16/2021   Lab Results  Component Value Date   HGBA1C 6.3 09/26/2021   No results found for: INSULIN Lab Results  Component Value Date   TSH 2.74 12/14/2021   Lab Results  Component Value Date   CHOL 203 (H) 09/26/2021   HDL 47.20 09/26/2021   LDLCALC 129 (H) 09/26/2021   TRIG 135.0 09/26/2021   CHOLHDL 4 09/26/2021   Lab Results  Component Value Date   VD25OH 30.3 11/29/2021   Lab Results  Component Value Date   WBC 6.9 11/16/2021   HGB 13.5 11/16/2021   HCT 43.2 11/16/2021   MCV 81.4 11/16/2021   PLT 327.0 11/16/2021   No results found for: IRON, TIBC, FERRITIN  Attestation Statements:   Reviewed by clinician on day of visit: allergies, medications, problem list, medical history, surgical history, family history, social history, and previous encounter notes.   Wilhemena Durie, am acting as transcriptionist for [employer], DO.  I have reviewed the above documentation for accuracy and completeness, and I agree with the above. [name], D.O.  The [place] was signed into law in 2016 which includes the topic of electronic health records.  This provides immediate access to information in MyChart.  This includes consultation notes, operative notes, office notes, lab results and pathology reports.  If you have any questions about what you read please let us know at your next visit so we can discuss your concerns and take corrective action if need be.  We are right here with you.

## 2021-12-19 NOTE — Telephone Encounter (Signed)
I will need to see the notes from urology as typically if fluid pill needed for swelling, they typically have prescribed that medication.  I am happy to review their notes and prescribe medication if it was recommended or can discuss at visit.  We will start with their notes first to see if I can help without a visit.

## 2021-12-20 NOTE — Telephone Encounter (Signed)
Endocrinologist Katherine  and Health and Wellness Dr Jenetta Downer are the ones that saw the swelling so this is where the notes will be coming from.

## 2021-12-21 NOTE — Telephone Encounter (Signed)
Endocrinologist Katherine  and Health and Wellness Dr Jenetta Downer are the ones that saw the swelling so this is where the notes will be coming from.

## 2021-12-22 NOTE — Telephone Encounter (Signed)
Record request sent to [facility] to send [place] this recommendation so that we can process the patients request

## 2021-12-22 NOTE — Telephone Encounter (Signed)
Note reviewed from February 8th visit with Dr. Raliegh Scarlet and I do not see any specific recommendations regarding medication for swelling or fluid pill, and did not note edema on the exam.  I do see the note from January 25 from Dr. Raliegh Scarlet when there was a discussion of shortness of breath with exercise that seemed to be worsening over time with weight gain.  No shortness of breath at rest or orthopnea noted at that visit  I also reviewed the note from February 9 with her endocrinologist.  Based on that note there was no pretibial edema on exam, and I do not see any specific mention of swelling or recommendations for medication for swelling.   Can we call to see if there are recnet notes or recommendations from Dr. Louis Meckel (urology)?

## 2021-12-22 NOTE — Telephone Encounter (Signed)
Pt is calling back for follow up on fluid pill

## 2021-12-30 NOTE — Telephone Encounter (Signed)
Unfortunately I have not seen any specific records related to the swelling.  It may be best that we discuss this at a visit and we can certainly prescribe a medication for swelling at that time if indicated.  Please apologize for the inconvenience.  Thanks.

## 2022-01-01 ENCOUNTER — Encounter (INDEPENDENT_AMBULATORY_CARE_PROVIDER_SITE_OTHER): Payer: Self-pay | Admitting: Adult Health

## 2022-01-01 ENCOUNTER — Other Ambulatory Visit: Payer: Self-pay

## 2022-01-01 ENCOUNTER — Ambulatory Visit (INDEPENDENT_AMBULATORY_CARE_PROVIDER_SITE_OTHER): Payer: 59 | Admitting: Adult Health

## 2022-01-01 VITALS — BP 118/76 | HR 83 | Temp 98.0°F | Ht 61.0 in | Wt 191.0 lb

## 2022-01-01 DIAGNOSIS — Z905 Acquired absence of kidney: Secondary | ICD-10-CM

## 2022-01-01 DIAGNOSIS — Z6836 Body mass index (BMI) 36.0-36.9, adult: Secondary | ICD-10-CM

## 2022-01-01 DIAGNOSIS — R110 Nausea: Secondary | ICD-10-CM | POA: Diagnosis not present

## 2022-01-01 DIAGNOSIS — Z9189 Other specified personal risk factors, not elsewhere classified: Secondary | ICD-10-CM

## 2022-01-01 DIAGNOSIS — E669 Obesity, unspecified: Secondary | ICD-10-CM | POA: Diagnosis not present

## 2022-01-01 MED ORDER — ONDANSETRON HCL 4 MG PO TABS: 4.0000 mg | ORAL_TABLET | Freq: Three times a day (TID) | ORAL | 0 refills | Status: DC | PRN

## 2022-01-01 NOTE — Telephone Encounter (Signed)
Pt needs an appointment we have not been provided with appropriate records for [place] to prescribe new medication.   Pt will need appointment so we can discuss new med

## 2022-01-01 NOTE — Telephone Encounter (Signed)
Pt is scheduled with Delfino Lovett

## 2022-01-02 ENCOUNTER — Other Ambulatory Visit: Payer: Self-pay

## 2022-01-02 ENCOUNTER — Ambulatory Visit (INDEPENDENT_AMBULATORY_CARE_PROVIDER_SITE_OTHER): Payer: 59 | Admitting: Registered Nurse

## 2022-01-02 ENCOUNTER — Encounter: Payer: Self-pay | Admitting: Registered Nurse

## 2022-01-02 VITALS — BP 104/56 | HR 98 | Temp 98.0°F | Resp 17 | Ht 61.0 in | Wt 195.4 lb

## 2022-01-02 DIAGNOSIS — Z905 Acquired absence of kidney: Secondary | ICD-10-CM | POA: Insufficient documentation

## 2022-01-02 DIAGNOSIS — R609 Edema, unspecified: Secondary | ICD-10-CM

## 2022-01-02 DIAGNOSIS — R110 Nausea: Secondary | ICD-10-CM | POA: Insufficient documentation

## 2022-01-02 LAB — BASIC METABOLIC PANEL
BUN: 19 mg/dL (ref 6–23)
CO2: 26 mEq/L (ref 19–32)
Calcium: 9.4 mg/dL (ref 8.4–10.5)
Chloride: 102 mEq/L (ref 96–112)
Creatinine, Ser: 1.15 mg/dL (ref 0.40–1.20)
GFR: 54.57 mL/min — ABNORMAL LOW (ref >60.00)
Glucose, Bld: 83 mg/dL (ref 70–99)
Potassium: 4.1 mEq/L (ref 3.5–5.1)
Sodium: 137 mEq/L (ref 135–145)

## 2022-01-02 LAB — URINALYSIS, ROUTINE W REFLEX MICROSCOPIC
Bilirubin Urine: NEGATIVE
Hgb urine dipstick: NEGATIVE
Ketones, ur: NEGATIVE
Leukocytes,Ua: NEGATIVE
Nitrite: NEGATIVE
RBC / HPF: NONE SEEN (ref 0–?)
Specific Gravity, Urine: <=1.005 — AB (ref 1.000–1.030)
Total Protein, Urine: NEGATIVE
Urine Glucose: NEGATIVE
Urobilinogen, UA: 0.2 (ref 0.0–1.0)
WBC, UA: NONE SEEN (ref 0–?)
pH: 6 (ref 5.0–8.0)

## 2022-01-02 MED ORDER — FUROSEMIDE 20 MG PO TABS: 10.0000 mg | ORAL_TABLET | Freq: Every day | ORAL | 0 refills | Status: DC

## 2022-01-02 NOTE — Progress Notes (Signed)
Chief Complaint:   OBESITY [name] is here to discuss her progress with her obesity treatment plan along with follow-up of her obesity related diagnoses. [name] is on the Category 1 Plan and states she is following her eating plan approximately 100% of the time. [name] states she is walking for 30 minutes 4 times per week.  Today's visit was #: 3 Starting weight: 190 lbs Starting date: 11/29/2021 Today's weight: 191 lbs Today's date: 01/01/2022 Total lbs lost to date: 0 Total lbs lost since last in-office visit: 1 lbs  Interim History:  Reviewed [name]'s dietary intake log. She started Wegovy 0.25 mg.  First injected on 12/29/2021 - nausea without vomiting- she requests Rx for ondansetron.  Maternal grandmother passed away from thyroid cancer - unsure of type.  Of Note- she has hx of hysterectomy.  Subjective:   1. 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 Urologist Dr. Louis Meckel. She reports gaining 70 pounds since her nephrectomy on 05/24/2017.  2. Nausea without vomiting Started Wegovy 0.25 mg on Friday, 12/29/2021 - nausea without vomiting. She denies mass in neck, dysphagia, dyspepsia, persistent hoarseness,or abd pain. EKG on 11/28/2021 - NSR, rate 72 bpm.  3. At risk for deficient intake of food [name] is at risk for deficient intake of food due to nausea with Wegovy.  Assessment/Plan:   1. H/O right nephrectomy Avoid nephrotoxic medications. F/u with Dr. Louis Meckel as directed.  2. Nausea without vomiting Do not overeat.   Start ondansetron 4 mg Q8H PRN, dispense 20 tablets, 0 refills.  3. At risk for deficient intake of food [name] was given approximately 15 minutes of deficient intake of food prevention counseling today. [name] is at risk for eating too few calories based on current food recall. She was encouraged to focus on meeting caloric and protein goals according to her  recommended meal plan.  4. Obesity with current BMI of 36.2  [name] is currently in the action stage of change. As such, her goal is to continue with weight loss efforts. She has agreed to the Category 1 Plan.   Continue Wegovy 0.25 mg once weekly. Do not overeat.  Exercise goals:  As is.  Behavioral modification strategies: increasing lean protein intake, decreasing simple carbohydrates, meal planning and cooking strategies, keeping healthy foods in the home, and planning for success.  [name] has agreed to follow-up with our clinic in 2 weeks. She was informed of the importance of frequent follow-up visits to maximize her success with intensive lifestyle modifications for her multiple health conditions.   Objective:   Blood pressure 118/76, pulse 83, temperature 98 F (36.7 C), height 5\' 1"  (1.549 m), weight 191 lb (86.6 kg), SpO2 99 %. Body mass index is 36.09 kg/m.  General: Cooperative, alert, well developed, in no acute distress. HEENT: Conjunctivae and lids unremarkable. Cardiovascular: Regular rhythm.  Lungs: Normal work of breathing. Neurologic: No focal deficits.   Lab Results  Component Value Date   CREATININE 0.92 11/30/2021   BUN 16 11/30/2021   NA 138 11/30/2021   K 4.7 11/30/2021   CL 100 11/30/2021   CO2 29 11/30/2021   Lab Results  Component Value Date   ALT 29 11/16/2021   AST 17 11/16/2021   ALKPHOS 90 11/16/2021   BILITOT 0.6 11/16/2021   Lab Results  Component Value Date   HGBA1C 6.3 09/26/2021   Lab Results  Component Value Date  TSH 2.74 12/14/2021   Lab Results  Component Value Date   CHOL 203 (H) 09/26/2021   HDL 47.20 09/26/2021   LDLCALC 129 (H) 09/26/2021   TRIG 135.0 09/26/2021   CHOLHDL 4 09/26/2021   Lab Results  Component Value Date   VD25OH 30.3 11/29/2021   Lab Results  Component Value Date   WBC 6.9 11/16/2021   HGB 13.5 11/16/2021   HCT 43.2 11/16/2021   MCV 81.4 11/16/2021   PLT 327.0 11/16/2021   Attestation  Statements:   Reviewed by clinician on day of visit: allergies, medications, problem list, medical history, surgical history, family history, social history, and previous encounter notes.  I, Water quality scientist, CMA, am acting as Location manager for Mina Marble, NP.  I have reviewed the above documentation for accuracy and completeness, and I agree with the above. -  Winslow Verrill d. Elgie Maziarz, NP-C

## 2022-01-02 NOTE — Progress Notes (Signed)
Established Patient Office Visit  Subjective:  Patient ID: [name], female    DOB: 04/08/1971  Age: 51 y.o. MRN: [id number]  CC:  Chief Complaint  Patient presents with   Foot Swelling    Patient has been having some foot/ankle swelling and also some hand swelling.    HPI [name] presents for urology follow up  Has been having some swollen hands and feet. Worsens throughout the day Healthy lifestyle, no new meds, no new supplements Of note, s/p nephrectomy.   She had been suggested by endocrinology to follow up with PCP in regards to fluid retention.   Past Medical History:  Diagnosis Date   Arthritis    Asthma    Complication of anesthesia    respirations slow down after coming out from Anesthesia   Endometriosis    GERD (gastroesophageal reflux disease)    H/O right nephrectomy    Mass of right kidney    Other fatigue    Palpitations    Post-operative nausea and vomiting    Shortness of breath on exertion     Past Surgical History:  Procedure Laterality Date   ABDOMINAL HYSTERECTOMY     CERVICAL DISC SURGERY     CESAREAN SECTION     CHOLECYSTECTOMY     COLONOSCOPY  12/17/2017   Dr.Stark   EUS N/A 03/14/2017   Procedure: UPPER ENDOSCOPIC ULTRASOUND (EUS) LINEAR;  Surgeon: Milus Banister, MD;  Location: [facility];  Service: Endoscopy;  Laterality: N/A;   I & D EXTREMITY Left 05/26/2021   Procedure: IRRIGATION AND DEBRIDEMENT EXTREMITY WITH COMPLEX CLOSURE OF WOUND;  Surgeon: Meredith Pel, MD;  Location: [place];  Service: Orthopedics;  Laterality: Left;   KNEE ARTHROSCOPY Left 05/26/2021   Procedure: ARTHROSCOPIC KNEE WASHOUT;  Surgeon: Meredith Pel, MD;  Location: [place];  Service: Orthopedics;  Laterality: Left;   LAPAROSCOPIC ABDOMINAL EXPLORATION N/A 05/17/2017   Procedure: LAPAROSCOPIC POSSIBLE OPEN REMOVAL OF STOMACH MASS;  Surgeon: Jackolyn Confer, MD;  Location: [facility];  Service: General;  Laterality: N/A;    LAPAROSCOPIC NEPHRECTOMY Right 05/17/2017   Procedure: RIGHT RADICAL NEPHRECTOMY, URETERECTOMY;  Surgeon: Ardis Hughs, MD;  Location: [facility];  Service: Urology;  Laterality: Right;   tubal preganancy      Family History  Problem Relation Age of Onset   Alcoholism Mother    Thyroid disease Mother    Stroke Mother    Hypertension Mother    Cancer Mother 81       "back cancer"   Other Mother        hysterectomy at 69 for "pre-cancerous cervical cells"   Obesity Father    Cancer Father    Hypertension Father    Colon cancer Father 94   Cervical cancer Sister        full sister dx. unspecified age   Colon polyps Sister        unspecified number   Colon polyps Sister        paternal half-sister w/ colon polyps dx. age 40 or younger - unspecified number   Colon cancer Sister 7       paternal half-sister    Colon polyps Sister        paternal half-sister; unspecified number   Colon cancer Sister        paternal half-sister dx. before age 74; has had two colon cancers   Breast cancer Sister        paternal half-sister dx  under age 55   Renal cancer Sister 65       paternal half-sister dx. w/ "[facility] - lymphoid renal cancer"   Cancer Sister 72       paternal half-sister dx cancer of wrist that was not a skin cancer   Breast cancer Maternal Grandmother        unspecified age   Diabetes Maternal Grandmother    Diabetes Paternal Grandfather    Colon polyps Son 71       has had a "bleeding polyp"   Colon cancer Paternal Aunt        (x2) paternal aunts with colon cancer at unspecified ages; lim info   Breast cancer Paternal Aunt        (x3) paternal aunts with breast cancer at unspecified ages; lim info   Colon cancer Paternal Uncle        dx. 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  Tobacco Use   Smoking status: Never   Smokeless tobacco: Never  Vaping Use   Vaping Use: Never used  Substance and Sexual Activity   Alcohol use: No   Drug use: No   Sexual activity: Yes    Birth control/protection: Surgical  Other Topics Concern   Not on file  Social History Narrative   Not on file   Social Determinants of Health   Financial Resource Strain: Not on file  Food Insecurity: Not on file  Transportation Needs: Not on file  Physical Activity: Not on file  Stress: Not on file  Social Connections: Not on file  Intimate Partner Violence: Not on file    Outpatient Medications Prior to Visit  Medication Sig Dispense Refill   albuterol (PROVENTIL HFA) 108 (90 Base) MCG/ACT inhaler Inhale 2 puffs into the lungs every 6 (six) hours as needed for wheezing or shortness of breath. 18 g 0   CEQUA 0.09 % SOLN Apply 1 drop to eye 2 (two) times daily.     Cholecalciferol (VITAMIN D3) 25 MCG (1000 UT) CAPS Take 2 capsules (2,000 Units total) by mouth daily. 60 capsule 5   methimazole (TAPAZOLE) 5 MG tablet Take 0.5 tablets (2.5 mg total) by mouth as directed. Half a tablet 4 days a week 26 tablet 2   NON FORMULARY Cultrelle     ondansetron (ZOFRAN) 4 MG tablet Take 1 tablet (4 mg total) by mouth every 8 (eight) hours as needed for nausea or vomiting. 20 tablet 0   pantoprazole (PROTONIX) 40 MG tablet Take 1 tablet (40 mg total) by mouth 2 (two) times daily. 60 tablet 11   Semaglutide-Weight Management (WEGOVY) 0.25 MG/0.5ML SOAJ Inject 0.25 mg into the skin.     No facility-administered medications prior to visit.    Allergies  Allergen Reactions   Amoxicillin Anaphylaxis    All "cillins" pt was 60-53 years old  Has patient had a PCN  reaction causing immediate rash, facial/tongue/throat swelling,  SOB or lightheadedness with hypotension: yes Has patient had a PCN reaction causing severe rash involving mucus membranes or skin necrosis: unknown Has patient had a PCN reaction that required hospitalization yes Has patient had a PCN reaction occurring within the last 10 years:no If all of the above answers are "NO", then may proceed with Cephalosporin use.    Ibuprofen Anaphylaxis and Hives   Keflex [Cephalexin] Anaphylaxis   Penicillins Anaphylaxis    Has patient had a PCN reaction causing immediate rash, facial/tongue/throat swelling, SOB or lightheadedness with hypotension:yes Has patient had a PCN reaction causing severe rash involving mucus membranes or skin necrosis: unknown Has patient had a PCN reaction that required hospitalization yes  Has patient had a PCN reaction occurring within the last 10 years: no If all of the above answers are "NO", then may proceed with Cephalosporin use.    Baby Powder [Methylbenzethonium] Hives   Doxycycline Diarrhea and Rash    ROS Review of Systems  Constitutional: Negative.   HENT: Negative.    Eyes: Negative.   Respiratory: Negative.    Cardiovascular:  Positive for leg swelling. Negative for chest pain and palpitations.  Gastrointestinal: Negative.   Genitourinary: Negative.   Musculoskeletal: Negative.   Skin: Negative.   Neurological: Negative.   Psychiatric/Behavioral: Negative.    All other systems reviewed and are negative.    Objective:    Physical Exam Vitals and nursing note reviewed.  Constitutional:      General: She is not in acute distress.    Appearance: Normal appearance. She is normal weight. She is not ill-appearing, toxic-appearing or diaphoretic.  Cardiovascular:     Rate and Rhythm: Normal rate and regular rhythm.     Heart sounds: Normal heart sounds. No murmur heard.   No friction rub. No gallop.  Pulmonary:     Effort: Pulmonary effort is  normal. No respiratory distress.     Breath sounds: Normal breath sounds. No stridor. No wheezing, rhonchi or rales.  Chest:     Chest wall: No tenderness.  Musculoskeletal:        General: No swelling, tenderness, deformity or signs of injury. Normal range of motion.     Right lower leg: Edema (mild) present.     Left lower leg: Edema (mild) present.  Skin:    General: Skin is warm and dry.     Capillary Refill: Capillary refill takes less than 2 seconds.  Neurological:     General: No focal deficit present.     Mental Status: She is alert and oriented to person, place, and time. Mental status is at baseline.  Psychiatric:        Mood and Affect: Mood normal.        Behavior: Behavior normal.        Thought Content: Thought content normal.        Judgment: Judgment normal.    BP (!) 104/56    Pulse 98    Temp 98 F (36.7 C) (Temporal)    Resp 17    Ht 5\' 1"  (1.549 m)    Wt 195 lb 6.4 oz (88.6 kg)    SpO2 98%    BMI 36.92 kg/m  Wt Readings from Last 3 Encounters:  01/02/22 195 lb 6.4 oz (88.6 kg)  01/01/22 191 lb (86.6 kg)  12/14/21 196 lb 6.4 oz (89.1 kg)     Health Maintenance Due  Topic Date Due   COVID-19 Vaccine (1) Never done   FOOT EXAM  Never done   OPHTHALMOLOGY EXAM  Never done   URINE MICROALBUMIN  Never done   Zoster Vaccines- Shingrix (1 of 2) Never done   PAP SMEAR-Modifier  01/24/2019    There are no preventive care reminders to display for this patient.  Lab Results  Component Value Date   TSH 2.74 12/14/2021   Lab Results  Component Value Date   WBC 6.9 11/16/2021   HGB 13.5 11/16/2021   HCT 43.2 11/16/2021   MCV 81.4 11/16/2021   PLT 327.0 11/16/2021   Lab Results  Component Value Date   NA 138 11/30/2021   K 4.7 11/30/2021   CO2 29 11/30/2021   GLUCOSE 92 11/30/2021   BUN 16 11/30/2021   CREATININE 0.92 11/30/2021   BILITOT 0.6 11/16/2021   ALKPHOS 90 11/16/2021   AST 17 11/16/2021   ALT 29 11/16/2021   PROT 7.1 11/16/2021    ALBUMIN 4.6 11/16/2021   CALCIUM 10.3 11/30/2021   ANIONGAP 7 11/05/2019   GFR 71.38 11/30/2021   Lab Results  Component Value Date   CHOL 203 (H) 09/26/2021   Lab Results  Component Value Date   HDL 47.20 09/26/2021   Lab Results  Component Value Date   LDLCALC 129 (H) 09/26/2021   Lab Results  Component Value Date   TRIG 135.0 09/26/2021   Lab Results  Component Value Date   CHOLHDL 4 09/26/2021   Lab Results  Component Value Date   HGBA1C 6.3 09/26/2021      Assessment & Plan:   Problem List Items Addressed This Visit   None Visit Diagnoses     Dependent edema    -  Primary   Relevant Medications   furosemide (LASIX) 20 MG tablet   Other Relevant Orders   Basic Metabolic Panel (BMET)   Urinalysis, Routine w reflex microscopic       Meds ordered this encounter  Medications   furosemide (LASIX) 20 MG tablet    Sig: Take 0.5 tablets (10 mg total) by mouth daily.    Dispense:  90 tablet    Refill:  0    Order Specific Question:   Supervising Provider    Answer:   Carlota Raspberry, JEFFREY R [[id number]]    Follow-up: Return in about 9 weeks (around 03/06/2022) for PCP - Med check furosemide.   PLAN Doubt cardiac etiology given presentation. She will continue to follow with nephrology and endocrinology as she has been. Will collect bmet and urinalysis to rule out other contributors Can start 10mg  furosemide daily. Discussed risks, benefits, and AE. She does have borderline low BP today - discussed importance of fluid volume repletion and low threshold for stopping the medication. She voices understanding. Patient encouraged to call clinic with any questions, comments, or concerns.  Maximiano Coss, NP

## 2022-01-02 NOTE — Progress Notes (Signed)
If we could call patient -  Kidney function is a little low. She should be seen by her kidney doc as soon as she can  Thanks,  Writer

## 2022-01-02 NOTE — Patient Instructions (Addendum)
[name]  Take furosemide 10mg  (1/2 tab) once daily in the morning. Continue hydration and healthy diet  I'll call if labs are of concern  Call me if this gives any adverse side effects   Follow up with Dr. Carlota Raspberry in 8-10 weeks  Thanks,  Denice Paradise     If you have lab work done today you will be contacted with your lab results within the next 2 weeks.  If you have not heard from [place] then please contact us. The fastest way to get your results is to register for My Chart.   IF you received an x-ray today, you will receive an invoice from [facility]. Please contact [facility] at [phone] with questions or concerns regarding your invoice.   IF you received labwork today, you will receive an invoice from [facility]. Please contact [facility] at [phone] with questions or concerns regarding your invoice.   Our billing staff will not be able to assist you with questions regarding bills from these companies.  You will be contacted with the lab results as soon as they are available. The fastest way to get your results is to activate your My Chart account. Instructions are located on the last page of this paperwork. If you have not heard from [place] regarding the results in 2 weeks, please contact this office.

## 2022-01-04 ENCOUNTER — Ambulatory Visit: Payer: 59 | Admitting: Family Medicine

## 2022-01-04 ENCOUNTER — Telehealth: Payer: Self-pay | Admitting: Registered Nurse

## 2022-01-04 NOTE — Telephone Encounter (Signed)
Recommend visit until we know what this is pertaining to ? ?Thanks, ? ?Rich

## 2022-01-04 NOTE — Telephone Encounter (Signed)
Pt is scheduled for a visit tomorrow  ?01/05/22 with Richard  ?

## 2022-01-04 NOTE — Telephone Encounter (Signed)
Pt called in asking if she can only speak with Delfino Lovett, she had questions will not give information.  ? ?Please advise pt can be reached at the home  ?

## 2022-01-05 ENCOUNTER — Telehealth: Payer: 59 | Admitting: Registered Nurse

## 2022-01-05 ENCOUNTER — Telehealth: Payer: Self-pay

## 2022-01-05 DIAGNOSIS — R7989 Other specified abnormal findings of blood chemistry: Secondary | ICD-10-CM

## 2022-01-05 DIAGNOSIS — Z905 Acquired absence of kidney: Secondary | ICD-10-CM

## 2022-01-05 NOTE — Telephone Encounter (Signed)
[name] did lab work that showed pts single kidney function was decreased significantly, she was told to see Nephrology but requires a referral in order to see them. Pt is asking for this referral to be placed urgently as she does only have one kidney at this time and is concerned with its function given lab values.  ? ?Wants to see [facility]  ? ?Please advise  ?

## 2022-01-05 NOTE — Telephone Encounter (Signed)
Creatinine slightly increased but I think it is reasonable that she meets with nephrology.  Referral ordered. ?

## 2022-01-05 NOTE — Telephone Encounter (Signed)
Caller name:Joydan Garcia-Hill  ? ?On DPR? :Yes ? ?Call back [phone] ? ?Provider they see: Carlota Raspberry  ? ?Reason for call: Pt needs a referral to [facility] regarding her kidneys primary has to call with referral  ?

## 2022-01-08 ENCOUNTER — Encounter: Payer: 59 | Admitting: Family Medicine

## 2022-01-15 ENCOUNTER — Ambulatory Visit (INDEPENDENT_AMBULATORY_CARE_PROVIDER_SITE_OTHER): Payer: 59 | Admitting: Family Medicine

## 2022-01-15 ENCOUNTER — Other Ambulatory Visit: Payer: Self-pay

## 2022-01-15 ENCOUNTER — Encounter (INDEPENDENT_AMBULATORY_CARE_PROVIDER_SITE_OTHER): Payer: Self-pay | Admitting: Family Medicine

## 2022-01-15 VITALS — BP 122/83 | HR 75 | Temp 98.3°F | Ht 61.0 in | Wt 186.0 lb

## 2022-01-15 DIAGNOSIS — E669 Obesity, unspecified: Secondary | ICD-10-CM | POA: Diagnosis not present

## 2022-01-15 DIAGNOSIS — Z9189 Other specified personal risk factors, not elsewhere classified: Secondary | ICD-10-CM

## 2022-01-15 DIAGNOSIS — R7303 Prediabetes: Secondary | ICD-10-CM

## 2022-01-15 DIAGNOSIS — E559 Vitamin D deficiency, unspecified: Secondary | ICD-10-CM | POA: Diagnosis not present

## 2022-01-15 DIAGNOSIS — Z6836 Body mass index (BMI) 36.0-36.9, adult: Secondary | ICD-10-CM

## 2022-01-15 DIAGNOSIS — Z6835 Body mass index (BMI) 35.0-35.9, adult: Secondary | ICD-10-CM | POA: Diagnosis not present

## 2022-01-15 MED ORDER — WEGOVY 0.25 MG/0.5ML ~~LOC~~ SOAJ: 0.2500 mg | SUBCUTANEOUS | 0 refills | Status: DC

## 2022-01-16 NOTE — Progress Notes (Signed)
? ? ? ?Chief Complaint:  ? ?OBESITY ?[name] is here to discuss her progress with her obesity treatment plan along with follow-up of her obesity related diagnoses. [name] is on the Category 1 Plan and states she is following her eating plan for an unknown percentage of the time. [name] states she is walking for 60 minutes 4 times per week. ? ?Today's visit was #: 4 ?Starting weight: 190 lbs ?Starting date: 11/29/2021 ?Today's weight: 186 lbs ?Today's date: 01/15/2022 ?Total lbs lost to date: 4 lbs ?Total lbs lost since last in-office visit: 5 lbs ? ?Interim History: [name] says that her urologist told her to decrease her intake of animal proteins; she was not told how many grams per day or ounces etc.   ?-She is logging her food intake on a sheet, but is not calculating calories or grams of proteins of those foods. ? ?Plan: Pt needs to journal calories and grams of protein each day in order to know if she is hitting nutritional goals in order to achieve wt loss ? ? ? ?Subjective:  ? ?Medications Discontinued During This Encounter  ?Medication Reason  ? Semaglutide-Weight Management (WEGOVY) 0.25 MG/0.5ML SOAJ Reorder  ?  ? ?Meds ordered this encounter  ?Medications  ? Semaglutide-Weight Management (WEGOVY) 0.25 MG/0.5ML SOAJ  ?  Sig: Inject 0.25 mg into the skin once a week.  ?  Dispense:  2 mL  ?  Refill:  0  ?  ? ?1. Pre-diabetes ?[name] started [facility] on 12/15/2021.  It had previously been given to her by [facility].   ?-- She had to take Zofran 2 times during the first week, and again during the second week due to significant nausea.   ?Lab Results  ?Component Value Date  ? HGBA1C 6.3 09/26/2021  ? ? ?2. Vitamin D deficiency ?Not at goal at 39 in Jan 23.   She is currently taking OTC vitamin D 4000 IU each day. She denies nausea, vomiting or muscle weakness. ? ? ?3. At risk for side effect of medication ?[name] is at risk for side effect of medication due to taking Wegovy. ? ? ? ?Assessment/Plan:  ? ?Medications  Discontinued During This Encounter  ?Medication Reason  ? Semaglutide-Weight Management (WEGOVY) 0.25 MG/0.5ML SOAJ Reorder  ?  ? ?Meds ordered this encounter  ?Medications  ? Semaglutide-Weight Management (WEGOVY) 0.25 MG/0.5ML SOAJ  ?  Sig: Inject 0.25 mg into the skin once a week.  ?  Dispense:  2 mL  ?  Refill:  0  ?  ? ?1. Pre-diabetes ?Refill and continue Wegovy 0.25 mg weekly for now since she had to take Zofran each time to tolerate the [facility].   ?- consider reck A1c. ?- I had a long discussion with pt, as she desired to increase her wegovy dose today, but I rec she stay at the same dose because she is still having to take zofran just to tolerate the wegovy which I explained to pt is NOT in the treatment plan and is not acceptable. ? ?Long discussion with pt about the importance of DECreasing simple carbs and INCreasing protein intake.   ? ?Although we do recommend no more than 2 d/wk of red meat in pts with HLD etc, I educated pt she needs to increase her protein intake and hit goals in order to hit wt loss goals   ? ?-Will consider increasing her dose at next office visit depending on tolerance and food intake/ journaling log. ? ?- Refill Semaglutide-Weight Management (  WEGOVY) 0.25 MG/0.5ML SOAJ; Inject 0.25 mg into the skin once a week.  Dispense: 2 mL; Refill: 0 ? ? ? ?2. Vitamin D deficiency ?Low Vitamin D level contributes to fatigue and are associated with obesity, breast, and colon cancer. She will continue to take her OTC vitamin D supplement. ? ? ? ?3. At risk for side effect of medication ?Due to [name]'s current conditions and medications, she is at a higher risk for drug side effect.  At least 23 minutes was spent on counseling her about these concerns today.  We discussed the benefits and potential risks of these medications, and all of patient's concerns were addressed and questions were answered.  she will call us, or their PCP or other specialists who treat their conditions with medications,  with any questions or concerns that may develop.   ?  ? ? ?4. Obesity with current BMI of 35.1 ?[name] is currently in the action stage of change. As such, her goal is to continue with weight loss efforts. She has agreed to keeping a food journal and adhering to recommended goals of [phone] calories and 75+ grams of protein.  ? ?Exercise goals:  As is. ? ?Behavioral modification strategies: keeping a strict food journal. ? ?[name] has agreed to follow-up with our clinic in 3 weeks, consider reck A1c at that time. She was informed of the importance of frequent follow-up visits to maximize her success with intensive lifestyle modifications for her multiple health conditions.  ? ? ? ?Objective:  ? ?Blood pressure 122/83, pulse 75, temperature 98.3 ?F (36.8 ?C), height '5\' 1"'$  (1.549 m), weight 186 lb (84.4 kg), SpO2 99 %. ?Body mass index is 35.14 kg/m?. ? ?General: Cooperative, alert, well developed, in no acute distress. ?HEENT: Conjunctivae and lids unremarkable. ?Cardiovascular: Regular rhythm.  ?Lungs: Normal work of breathing. ?Neurologic: No focal deficits.  ? ? ?Lab Results  ?Component Value Date  ? CREATININE 1.15 01/02/2022  ? BUN 19 01/02/2022  ? NA 137 01/02/2022  ? K 4.1 01/02/2022  ? CL 102 01/02/2022  ? CO2 26 01/02/2022  ? ?Lab Results  ?Component Value Date  ? ALT 29 11/16/2021  ? AST 17 11/16/2021  ? ALKPHOS 90 11/16/2021  ? BILITOT 0.6 11/16/2021  ? ?Lab Results  ?Component Value Date  ? HGBA1C 6.3 09/26/2021  ? ?Lab Results  ?Component Value Date  ? TSH 2.74 12/14/2021  ? ?Lab Results  ?Component Value Date  ? CHOL 203 (H) 09/26/2021  ? HDL 47.20 09/26/2021  ? LDLCALC 129 (H) 09/26/2021  ? TRIG 135.0 09/26/2021  ? CHOLHDL 4 09/26/2021  ? ?Lab Results  ?Component Value Date  ? VD25OH 30.3 11/29/2021  ? ?Lab Results  ?Component Value Date  ? WBC 6.9 11/16/2021  ? HGB 13.5 11/16/2021  ? HCT 43.2 11/16/2021  ? MCV 81.4 11/16/2021  ? PLT 327.0 11/16/2021  ? ? ? ?Attestation Statements:  ? ?Reviewed by  clinician on day of visit: allergies, medications, problem list, medical history, surgical history, family history, social history, and previous encounter notes. ? ?I, Water quality scientist, CMA, am acting as transcriptionist for [employer], DO. ? ?I have reviewed the above documentation for accuracy and completeness, and I agree with the above. [name], D.O. ? ?The [place] was signed into law in 2016 which includes the topic of electronic health records.  This provides immediate access to information in MyChart.  This includes consultation notes, operative notes, office notes, lab results and  pathology reports.  If you have any questions about what you read please let us know at your next visit so we can discuss your concerns and take corrective action if need be.  We are right here with you. ? ?

## 2022-01-17 ENCOUNTER — Telehealth (INDEPENDENT_AMBULATORY_CARE_PROVIDER_SITE_OTHER): Payer: Self-pay | Admitting: Family Medicine

## 2022-01-17 NOTE — Telephone Encounter (Signed)
Prior authorization denied for [facility]. Per plan drug not covered/plan exclusion. I called and spoke to patient and informed of denial. Patient does not have active mychart.  ?

## 2022-01-25 ENCOUNTER — Encounter: Payer: Self-pay | Admitting: Internal Medicine

## 2022-01-25 ENCOUNTER — Other Ambulatory Visit (INDEPENDENT_AMBULATORY_CARE_PROVIDER_SITE_OTHER): Payer: 59

## 2022-01-25 ENCOUNTER — Other Ambulatory Visit: Payer: Self-pay

## 2022-01-25 DIAGNOSIS — E0590 Thyrotoxicosis, unspecified without thyrotoxic crisis or storm: Secondary | ICD-10-CM | POA: Diagnosis not present

## 2022-01-25 LAB — TSH: TSH: 1.63 u[IU]/mL (ref 0.35–5.50)

## 2022-01-25 LAB — T4, FREE: Free T4: 0.72 ng/dL (ref 0.60–1.60)

## 2022-01-29 ENCOUNTER — Encounter (INDEPENDENT_AMBULATORY_CARE_PROVIDER_SITE_OTHER): Payer: Self-pay | Admitting: Family Medicine

## 2022-01-29 ENCOUNTER — Other Ambulatory Visit: Payer: Self-pay

## 2022-01-29 ENCOUNTER — Ambulatory Visit (INDEPENDENT_AMBULATORY_CARE_PROVIDER_SITE_OTHER): Payer: 59 | Admitting: Family Medicine

## 2022-01-29 VITALS — BP 122/79 | HR 71 | Temp 97.3°F | Ht 61.0 in | Wt 183.0 lb

## 2022-01-29 DIAGNOSIS — R7303 Prediabetes: Secondary | ICD-10-CM

## 2022-01-29 DIAGNOSIS — Z6834 Body mass index (BMI) 34.0-34.9, adult: Secondary | ICD-10-CM | POA: Diagnosis not present

## 2022-01-29 DIAGNOSIS — K219 Gastro-esophageal reflux disease without esophagitis: Secondary | ICD-10-CM | POA: Diagnosis not present

## 2022-01-29 DIAGNOSIS — Z9189 Other specified personal risk factors, not elsewhere classified: Secondary | ICD-10-CM

## 2022-01-29 DIAGNOSIS — E669 Obesity, unspecified: Secondary | ICD-10-CM | POA: Diagnosis not present

## 2022-01-29 DIAGNOSIS — E559 Vitamin D deficiency, unspecified: Secondary | ICD-10-CM

## 2022-01-31 NOTE — Progress Notes (Signed)
? ? ? ?Chief Complaint:  ? ?OBESITY ?[name] is here to discuss her progress with her obesity treatment plan along with follow-up of her obesity related diagnoses. [name] is on keeping a food journal and adhering to recommended goals of [phone] calories and 75+ grams of protein daily and states she is following her eating plan approximately 95% of the time. [name] states she is walking for 60-120 minutes 5 times per week. ? ?Today's visit was #: 5 ?Starting weight: 190 lbs ?Starting date: 11/29/2021 ?Today's weight: 183 lbs ?Today's date: 01/29/2022 ?Total lbs lost to date: 7 ?Total lbs lost since last in-office visit: 3 ? ?Interim History: [name] is hitting her goals of calories and protein the majority of the time. No hunger, no cravings, and drinking 96 oz of water per day. She is doing excellent with exercise, and walking 5 days per week and 2 days doing resistance training. ? ? ?Subjective:  ? ?1. Pre-diabetes ?[name] is on Mounjaro and pays $580 per month for it at [employer]. She is getting it via a weight loss telemed doc in [place], which she pays cash for. [name] became too expensive and she wants to be able to take the dose she wants to take and she endorses that the telemed weight loss doc is meeting her needs regarding medication use/dosing. ? ?2. Vitamin D deficiency ?She is currently taking OTC vitamin D 4,000 IU each day. She denies nausea, vomiting or muscle weakness. ? ?3. Gastroesophageal reflux disease, unspecified whether esophagitis present ?[name] has stable symptoms and improved with weight loss. No issues or concerns.  ? ?4. At risk for impaired metabolic function ?[name] is at increased risk for impaired metabolic function due to pre-diabetes. ? ?Assessment/Plan:  ? ?Medications Discontinued During This Encounter  ?Medication Reason  ? Semaglutide-Weight Management (WEGOVY) 0.25 MG/0.5ML SOAJ   ?  ? ?1. Pre-diabetes ?Medication management per a weight loss telemed doc in [place].  [name] still desires to come  in every 2-3 weeks here for medical and nutritional direction. She will continue her prudent nutritional plan and weight loss to improve this condition. Reck A1c q 3-75mo? ?2. Vitamin D deficiency ?[name] continue OTC Vitamin D supplementation of 4,000 IU daily, which she started after labs drawn and level around 30.  Not at goal. REPEAT labs around end of April/ early May. She will follow-up for routine testing of Vitamin D, at least 2-3 times per year to avoid over-replacement. ? ?3. Gastroesophageal reflux disease, unspecified whether esophagitis present ?[name] continue Protonix, Advised to never lay down within 3 hours of eating.   Lifestyle modifications are the first line treatment for this issue. Some of these include avoiding trigger foods, decrease caffeine intake, decrease abdominal girth, no tobacco cessation (if applicable).  Whitney Whitney Billerwill continue to work on diet, exercise and weight loss efforts.  Orders and follow up as documented in patient record. ? ? ?4. At risk for impaired metabolic function ?[name] was given approximately 9 minutes of impaired  metabolic function prevention counseling today. We discussed intensive lifestyle modifications today with an emphasis on specific nutrition and exercise instructions and strategies.  ? ?Repetitive spaced learning was employed today to elicit superior memory formation and behavioral change. ? ?5. Obesity with current BMI of 34.7 ?[name] currently in the action stage of change. As such, her goal is to continue with weight loss efforts. She has agreed to keeping a food journal and adhering to recommended goals of [phone] calories and 75+  grams of protein daily.  ? ?Recipe packet 1 was given to the patient. ? ?Exercise goals: As is. ? ?Behavioral modification strategies: no skipping meals and meal planning and cooking strategies. ? ?[name] has agreed to follow-up with our clinic in 2 weeks. She was informed of the importance of frequent  follow-up visits to maximize her success with intensive lifestyle modifications for her multiple health conditions.  ? ?Objective:  ? ?Blood pressure 122/79, pulse 71, temperature (!) 97.3 ?F (36.3 ?C), height '5\' 1"'$  (1.549 m), weight 183 lb (83 kg), SpO2 99 %. ?Body mass index is 34.58 kg/m?. ? ?General: Cooperative, alert, well developed, in no acute distress. ?HEENT: Conjunctivae and lids unremarkable. ?Cardiovascular: Regular rhythm.  ?Lungs: Normal work of breathing. ?Neurologic: No focal deficits.  ? ?Lab Results  ?Component Value Date  ? CREATININE 1.15 01/02/2022  ? BUN 19 01/02/2022  ? NA 137 01/02/2022  ? K 4.1 01/02/2022  ? CL 102 01/02/2022  ? CO2 26 01/02/2022  ? ?Lab Results  ?Component Value Date  ? ALT 29 11/16/2021  ? AST 17 11/16/2021  ? ALKPHOS 90 11/16/2021  ? BILITOT 0.6 11/16/2021  ? ?Lab Results  ?Component Value Date  ? HGBA1C 6.3 09/26/2021  ? ?No results found for: INSULIN ?Lab Results  ?Component Value Date  ? TSH 1.63 01/25/2022  ? ?Lab Results  ?Component Value Date  ? CHOL 203 (H) 09/26/2021  ? HDL 47.20 09/26/2021  ? LDLCALC 129 (H) 09/26/2021  ? TRIG 135.0 09/26/2021  ? CHOLHDL 4 09/26/2021  ? ?Lab Results  ?Component Value Date  ? VD25OH 30.3 11/29/2021  ? ?Lab Results  ?Component Value Date  ? WBC 6.9 11/16/2021  ? HGB 13.5 11/16/2021  ? HCT 43.2 11/16/2021  ? MCV 81.4 11/16/2021  ? PLT 327.0 11/16/2021  ? ?No results found for: IRON, TIBC, FERRITIN ? ?Attestation Statements:  ? ?Reviewed by clinician on day of visit: allergies, medications, problem list, medical history, surgical history, family history, social history, and previous encounter notes. ? ? ?I, Trixie Dredge, am acting as transcriptionist for [employer], DO. ? ?I have reviewed the above documentation for accuracy and completeness, and I agree with the above. [name], D.O. ? ?The [place] was signed into law in 2016 which includes the topic of electronic health records.  This provides  immediate access to information in MyChart.  This includes consultation notes, operative notes, office notes, lab results and pathology reports.  If you have any questions about what you read please let us know at your next visit so we can discuss your concerns and take corrective action if need be.  We are right here with you. ? ? ?

## 2022-02-08 ENCOUNTER — Encounter (INDEPENDENT_AMBULATORY_CARE_PROVIDER_SITE_OTHER): Payer: Self-pay | Admitting: Adult Health

## 2022-02-08 ENCOUNTER — Ambulatory Visit (INDEPENDENT_AMBULATORY_CARE_PROVIDER_SITE_OTHER): Payer: 59 | Admitting: Adult Health

## 2022-02-08 VITALS — BP 104/68 | HR 94 | Temp 97.6°F | Ht 61.0 in | Wt 177.0 lb

## 2022-02-08 DIAGNOSIS — R7303 Prediabetes: Secondary | ICD-10-CM

## 2022-02-08 DIAGNOSIS — Z6833 Body mass index (BMI) 33.0-33.9, adult: Secondary | ICD-10-CM

## 2022-02-08 DIAGNOSIS — E669 Obesity, unspecified: Secondary | ICD-10-CM

## 2022-02-08 DIAGNOSIS — E0590 Thyrotoxicosis, unspecified without thyrotoxic crisis or storm: Secondary | ICD-10-CM | POA: Diagnosis not present

## 2022-02-14 NOTE — Progress Notes (Addendum)
Chief Complaint:   OBESITY Lincoln is here to discuss her progress with her obesity treatment plan along with follow-up of her obesity related diagnoses. Fama is on keeping a food journal and adhering to recommended goals of [phone] calories and 75 grams of protein and states she is following her eating plan approximately 100% of the time. Tenea states she is walking/lifting weights for 120 minutes 6 times per week.  Today's visit was #: 6 Starting weight: 190 lbs Starting date: 11/29/2021 Today's weight: 177 lbs Today's date: 02/08/2022 Total lbs lost to date: 13 lbs Total lbs lost since last in-office visit: 6 lbs  Interim History:  Angellee is receiving Obesity treatment via  telemedicine/telemed in [place] - weekly contact via text, monthly video visits. $673 Wegovy, $580 Mounjaro for a 83-monthsupply.   She is getting in 1000 calories/75 grams of protein per day. JLayceehas 4 children and 8 grandchildren. She has reduced red meat intake to once a week.  Subjective:   1. Pre-diabetes On 01/29/2022, WMancel Parsonswas d/c'd. She will be on Mounjaro 5 mg for 2 more weeks, then telemed OV and she will increase Mounjaro to 7.5 mg.  2. Hyperthyroidism She has history of hyperthyroidism, Graves disease, multinodular goiter. On 10/20/2021 - UKoreathyroid: IMPRESSION: 1 cm right isthmus TR 4 nodule meets criteria for follow-up in 1 year.   Additional subcentimeter nodules versus pseudo nodularity noted.   The above is in keeping with the ACR TI-RADS recommendations - J Am Coll Radiol 2017;14:587-595.  Assessment/Plan:   1. Pre-diabetes Continue Mounjaro per telemed provider.  2. Hyperthyroidism Follow-up with Endo/Dr. SKelton Pillar  3. Obesity with current BMI of 33.6  Jamika is currently in the action stage of change. As such, her goal is to continue with weight loss efforts. She has agreed to keeping a food journal and adhering to recommended goals of [phone] calories and 75 grams of  protein.   Exercise goals:  As is.  Behavioral modification strategies: increasing lean protein intake, decreasing simple carbohydrates, meal planning and cooking strategies, keeping healthy foods in the home, and planning for success.  JEliyanahhas agreed to follow-up with our clinic in 2 weeks. She was informed of the importance of frequent follow-up visits to maximize her success with intensive lifestyle modifications for her multiple health conditions.   Objective:   Blood pressure 104/68, pulse 94, temperature 97.6 F (36.4 C), height '5\' 1"'$  (1.549 m), weight 177 lb (80.3 kg), SpO2 99 %. Body mass index is 33.44 kg/m.  General: Cooperative, alert, well developed, in no acute distress. HEENT: Conjunctivae and lids unremarkable. Cardiovascular: Regular rhythm.  Lungs: Normal work of breathing. Neurologic: No focal deficits.   Lab Results  Component Value Date   CREATININE 1.15 01/02/2022   BUN 19 01/02/2022   NA 137 01/02/2022   K 4.1 01/02/2022   CL 102 01/02/2022   CO2 26 01/02/2022   Lab Results  Component Value Date   ALT 29 11/16/2021   AST 17 11/16/2021   ALKPHOS 90 11/16/2021   BILITOT 0.6 11/16/2021   Lab Results  Component Value Date   HGBA1C 6.3 09/26/2021   Lab Results  Component Value Date   TSH 1.63 01/25/2022   Lab Results  Component Value Date   CHOL 203 (H) 09/26/2021   HDL 47.20 09/26/2021   LDLCALC 129 (H) 09/26/2021   TRIG 135.0 09/26/2021   CHOLHDL 4 09/26/2021   Lab Results  Component Value Date   VD25OH 30.3  11/29/2021   Lab Results  Component Value Date   WBC 6.9 11/16/2021   HGB 13.5 11/16/2021   HCT 43.2 11/16/2021   MCV 81.4 11/16/2021   PLT 327.0 11/16/2021   Attestation Statements:   Reviewed by clinician on day of visit: allergies, medications, problem list, medical history, surgical history, family history, social history, and previous encounter notes.  Time spent on visit including pre-visit chart review and post-visit  care and charting was 27 minutes.   I, Water quality scientist, CMA, am acting as Location manager for Mina Marble, NP.  I have reviewed the above documentation for accuracy and completeness, and I agree with the above. -  Kynsli Haapala d. [name] Landgrebe, NP-C

## 2022-02-20 DIAGNOSIS — R7303 Prediabetes: Secondary | ICD-10-CM | POA: Insufficient documentation

## 2022-02-22 ENCOUNTER — Ambulatory Visit (INDEPENDENT_AMBULATORY_CARE_PROVIDER_SITE_OTHER): Payer: 59 | Admitting: Family Medicine

## 2022-02-22 ENCOUNTER — Encounter (INDEPENDENT_AMBULATORY_CARE_PROVIDER_SITE_OTHER): Payer: Self-pay | Admitting: Family Medicine

## 2022-02-22 VITALS — BP 106/71 | HR 60 | Temp 98.0°F | Ht 61.0 in | Wt 171.0 lb

## 2022-02-22 DIAGNOSIS — E669 Obesity, unspecified: Secondary | ICD-10-CM

## 2022-02-22 DIAGNOSIS — R7303 Prediabetes: Secondary | ICD-10-CM | POA: Diagnosis not present

## 2022-02-22 DIAGNOSIS — E559 Vitamin D deficiency, unspecified: Secondary | ICD-10-CM

## 2022-02-22 DIAGNOSIS — Z9189 Other specified personal risk factors, not elsewhere classified: Secondary | ICD-10-CM

## 2022-02-22 DIAGNOSIS — K760 Fatty (change of) liver, not elsewhere classified: Secondary | ICD-10-CM

## 2022-02-22 DIAGNOSIS — Z6832 Body mass index (BMI) 32.0-32.9, adult: Secondary | ICD-10-CM | POA: Diagnosis not present

## 2022-02-23 LAB — VITAMIN D 25 HYDROXY (VIT D DEFICIENCY, FRACTURES): Vit D, 25-Hydroxy: 55 ng/mL (ref 30.0–100.0)

## 2022-02-23 LAB — COMPREHENSIVE METABOLIC PANEL
ALT: 60 IU/L — ABNORMAL HIGH (ref 0–32)
AST: 30 IU/L (ref 0–40)
Albumin/Globulin Ratio: 2.1 (ref 1.2–2.2)
Albumin: 4.8 g/dL (ref 3.8–4.9)
Alkaline Phosphatase: 105 IU/L (ref 44–121)
BUN/Creatinine Ratio: 17 (ref 9–23)
BUN: 17 mg/dL (ref 6–24)
Bilirubin Total: 0.4 mg/dL (ref 0.0–1.2)
CO2: 23 mmol/L (ref 20–29)
Calcium: 10.5 mg/dL — ABNORMAL HIGH (ref 8.7–10.2)
Chloride: 103 mmol/L (ref 96–106)
Creatinine, Ser: 0.99 mg/dL (ref 0.57–1.00)
Globulin, Total: 2.3 g/dL (ref 1.5–4.5)
Glucose: 101 mg/dL — ABNORMAL HIGH (ref 70–99)
Potassium: 4.9 mmol/L (ref 3.5–5.2)
Sodium: 144 mmol/L (ref 134–144)
Total Protein: 7.1 g/dL (ref 6.0–8.5)
eGFR: 68 mL/min/{1.73_m2} (ref >59)

## 2022-02-23 LAB — HEMOGLOBIN A1C
Est. average glucose Bld gHb Est-mCnc: 117 mg/dL
Hgb A1c MFr Bld: 5.7 % — ABNORMAL HIGH (ref 4.8–5.6)

## 2022-03-01 ENCOUNTER — Telehealth: Payer: Self-pay

## 2022-03-01 ENCOUNTER — Other Ambulatory Visit: Payer: Self-pay

## 2022-03-01 ENCOUNTER — Ambulatory Visit (INDEPENDENT_AMBULATORY_CARE_PROVIDER_SITE_OTHER): Payer: 59 | Admitting: Family Medicine

## 2022-03-01 VITALS — BP 116/66 | HR 66 | Temp 97.7°F | Resp 18 | Ht 61.0 in | Wt 174.8 lb

## 2022-03-01 DIAGNOSIS — K219 Gastro-esophageal reflux disease without esophagitis: Secondary | ICD-10-CM | POA: Diagnosis not present

## 2022-03-01 DIAGNOSIS — Z905 Acquired absence of kidney: Secondary | ICD-10-CM | POA: Diagnosis not present

## 2022-03-01 DIAGNOSIS — R609 Edema, unspecified: Secondary | ICD-10-CM | POA: Diagnosis not present

## 2022-03-01 DIAGNOSIS — D490 Neoplasm of unspecified behavior of digestive system: Secondary | ICD-10-CM

## 2022-03-01 DIAGNOSIS — N182 Chronic kidney disease, stage 2 (mild): Secondary | ICD-10-CM | POA: Diagnosis not present

## 2022-03-01 DIAGNOSIS — R7303 Prediabetes: Secondary | ICD-10-CM | POA: Diagnosis not present

## 2022-03-01 DIAGNOSIS — L819 Disorder of pigmentation, unspecified: Secondary | ICD-10-CM | POA: Diagnosis not present

## 2022-03-01 DIAGNOSIS — N809 Endometriosis, unspecified: Secondary | ICD-10-CM | POA: Diagnosis not present

## 2022-03-01 DIAGNOSIS — E0590 Thyrotoxicosis, unspecified without thyrotoxic crisis or storm: Secondary | ICD-10-CM | POA: Diagnosis not present

## 2022-03-01 MED ORDER — FUROSEMIDE 20 MG PO TABS: 10.0000 mg | ORAL_TABLET | Freq: Every day | ORAL | 1 refills | Status: DC

## 2022-03-01 NOTE — Patient Instructions (Addendum)
?  Please clarify thyroid diagnosis with your endocrinologist, but on my reading it appears to be Graves' disease.  ?No change in furosemide dose for now ?Area on abdomen appears to be just a small area of hyperpigmented skin, not concerning but I do recommend keeping follow-up appointment with dermatology.  Be seen if any new symptoms or worsening. ?I would recommend discussing concerns and possible medication changes of your proton pump inhibitor with gastroenterology, but with your previous surgery you may need to remain on same dose. ? ? ?If you have lab work done today you will be contacted with your lab results within the next 2 weeks.  If you have not heard from [place] then please contact us. The fastest way to get your results is to register for My Chart. ? ? ?IF you received an x-ray today, you will receive an invoice from [facility]. Please contact [facility] at [phone] with questions or concerns regarding your invoice.  ? ?IF you received labwork today, you will receive an invoice from [facility]. Please contact [facility] at [phone] with questions or concerns regarding your invoice.  ? ?Our billing staff will not be able to assist you with questions regarding bills from these companies. ? ?You will be contacted with the lab results as soon as they are available. The fastest way to get your results is to activate your My Chart account. Instructions are located on the last page of this paperwork. If you have not heard from [place] regarding the results in 2 weeks, please contact this office. ?  ? ? ?

## 2022-03-01 NOTE — Telephone Encounter (Signed)
Patient advised and states that she will discuss at appointment next week.  ?

## 2022-03-01 NOTE — Telephone Encounter (Signed)
Patient states that her chart needs to reflect that she has Hashimoto's. Patient needs this to be corrected today so she can take to another appointment if possible  ?

## 2022-03-01 NOTE — Progress Notes (Signed)
? ?Subjective:  ?Patient ID: [name], female    DOB: 04-15-1971  Age: 51 y.o. MRN: [id number] ? ?CC:  ?Chief Complaint  ?Patient presents with  ? Follow-up  ?  Patient states she is here to follow up on medication. Patient would like to discuss a spot on her stomach  ? ? ?HPI ?[name] presents for  ? ?Lasix refill, dependent edema.  ?Hx of solitary kidney, s/p nephrectomy. nephrology appt today.  ?Seen by Kathrin Ruddy on 01/02/22. Edema in hands and feet. Started on lasix '10mg'$  per day. Feels like this has been helpful.  ?Creat 0.99 on 02/22/22. Down from 1.15 on 01/02/22.  ?Increased exercise and followed by healthy weight and wellness.  ?Lab Results  ?Component Value Date  ? NA 144 02/22/2022  ? K 4.9 02/22/2022  ? CL 103 02/22/2022  ? CO2 23 02/22/2022  ? ?Followed by GI - Dr. Fuller Plan, on 2 PPI QD, plans to discuss concerns.  ? ?Spot on abdomen ?Light spot on lower abdomen. Noticed 5 yrs ago, no changes, but still there. Has derm appointment in July.  ? ?History ?Patient Active Problem List  ? Diagnosis Date Noted  ? Vitamin D deficiency 02/22/2022  ? NAFLD (nonalcoholic fatty liver disease) 02/22/2022  ? Pre-diabetes 02/20/2022  ? H/O right nephrectomy 01/02/2022  ? Nausea without vomiting 01/02/2022  ? Graves disease 12/14/2021  ? Hyperthyroidism 12/14/2021  ? Multinodular goiter 12/14/2021  ? Open fracture of left patella   ? Laceration of left knee with complication   ? Open wound of left knee   ? S/P left knee arthroscopy 05/27/2021  ? Laceration of left knee 05/26/2021  ? 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 Atrophic kidney s/p right nepherctomy 05/17/2017 05/18/2017  ? GERD (gastroesophageal reflux disease)   ? Complication of anesthesia   ? Arthritis   ? 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 Solitary pulmonary nodule 02/18/2017  ? Genetic testing 04/04/2016  ? Family history of colon cancer 03/14/2016  ? Family history of breast cancer in  female 03/14/2016  ? Class 2 severe obesity with serious comorbidity and body mass index (BMI) of 36.0 to 36.9 in adult [employer]) 01/18/2012  ? Anxiety state 04/20/2008  ? Cough variant asthma 04/20/2008  ? LIVER FUNCTION TESTS, ABNORMAL 04/20/2008  ? Attention deficit hyperactivity disorder (ADHD) 06/10/2007  ? ?Past Medical History:  ?Diagnosis Date  ? Arthritis   ? Asthma   ? Complication of anesthesia   ? respirations slow down after coming out from Anesthesia  ? Endometriosis   ? GERD (gastroesophageal reflux disease)   ? H/O right nephrectomy   ? Mass of right kidney   ? Other fatigue   ? Palpitations   ? Post-operative nausea and vomiting   ? Shortness of breath on exertion   ? ?Past Surgical History:  ?Procedure Laterality Date  ? ABDOMINAL HYSTERECTOMY    ? CERVICAL DISC SURGERY    ? CESAREAN SECTION    ? CHOLECYSTECTOMY    ? COLONOSCOPY  12/17/2017  ? Dr.Stark  ? EUS N/A 03/14/2017  ? Procedure: UPPER ENDOSCOPIC ULTRASOUND (EUS) LINEAR;  Surgeon: Milus Banister, MD;  Location: [facility];  Service: Endoscopy;  Laterality: N/A;  ? I & D EXTREMITY Left 05/26/2021  ? Procedure: IRRIGATION AND DEBRIDEMENT EXTREMITY WITH COMPLEX CLOSURE OF WOUND;  Surgeon: Meredith Pel, MD;  Location: [facility];  Service: Orthopedics;  Laterality: Left;  ? KNEE ARTHROSCOPY Left 05/26/2021  ?  Procedure: ARTHROSCOPIC KNEE WASHOUT;  Surgeon: Meredith Pel, MD;  Location: [place];  Service: Orthopedics;  Laterality: Left;  ? LAPAROSCOPIC ABDOMINAL EXPLORATION N/A 05/17/2017  ? Procedure: LAPAROSCOPIC POSSIBLE OPEN REMOVAL OF STOMACH MASS;  Surgeon: Jackolyn Confer, MD;  Location: [facility];  Service: General;  Laterality: N/A;  ? LAPAROSCOPIC NEPHRECTOMY Right 05/17/2017  ? Procedure: RIGHT RADICAL NEPHRECTOMY, URETERECTOMY;  Surgeon: Ardis Hughs, MD;  Location: [facility];  Service: Urology;  Laterality: Right;  ? tubal preganancy    ? ?Allergies  ?Allergen Reactions  ? Amoxicillin Anaphylaxis  ?  All "cillins" pt was 41-79  years old  ?Has patient had a PCN reaction causing immediate rash, facial/tongue/throat swelling, SOB or lightheadedness with hypotension: yes ?Has patient had a PCN reaction causing severe rash involving mucus membranes or skin necrosis: unknown ?Has patient had a PCN reaction that required hospitalization yes ?Has patient had a PCN reaction occurring within the last 10 years:no ?If all of the above answers are "NO", then may proceed with Cephalosporin use. ?  ? Ibuprofen Anaphylaxis and Hives  ? Keflex [Cephalexin] Anaphylaxis  ? Penicillins Anaphylaxis  ?  Has patient had a PCN reaction causing immediate rash, facial/tongue/throat swelling, SOB or lightheadedness with hypotension:yes ?Has patient had a PCN reaction causing severe rash involving mucus membranes or skin necrosis: unknown ?Has patient had a PCN reaction that required hospitalization yes  ?Has patient had a PCN reaction occurring within the last 10 years: no ?If all of the above answers are "NO", then may proceed with Cephalosporin use. ?  ? Baby Powder [Methylbenzethonium] Hives  ? Doxycycline Diarrhea and Rash  ? ?Prior to Admission medications   ?Medication Sig Start Date End Date Taking? Authorizing Provider  ?albuterol (PROVENTIL HFA) 108 (90 Base) MCG/ACT inhaler Inhale 2 puffs into the lungs every 6 (six) hours as needed for wheezing or shortness of breath. 07/20/21  Yes Wendie Agreste, MD  ?CEQUA 0.09 % SOLN Apply 1 drop to eye 2 (two) times daily. 12/29/21  Yes [provider]  ?Cholecalciferol (VITAMIN D3) 25 MCG (1000 UT) CAPS Take 2 capsules (2,000 Units total) by mouth daily. 12/18/21  Yes Wendie Agreste, MD  ?furosemide (LASIX) 20 MG tablet Take 0.5 tablets (10 mg total) by mouth daily. 01/02/22  Yes Maximiano Coss, NP  ?methimazole (TAPAZOLE) 5 MG tablet Take 0.5 tablets (2.5 mg total) by mouth as directed. Half a tablet 4 days a week 12/14/21  Yes Shamleffer, Melanie Crazier, MD  ?NON FORMULARY [place]   Yes [provider]  ?ondansetron (ZOFRAN) 4 MG tablet Take 1 tablet (4 mg total) by mouth every 8 (eight) hours as needed for nausea or vomiting. 01/01/22  Yes Danford, Valetta Fuller D, NP  ?pantoprazole (PROTONIX) 40 MG tablet Take 1 tablet (40 mg total) by mouth 2 (two) times daily. 10/20/21  Yes Ladene Artist, MD  ?tirzepatide [facility]) 5 MG/0.5ML Pen Inject 5 mg into the skin once a week.   Yes [provider]  ? ?Social History  ? ?Socioeconomic History  ? Marital status: Married  ?  Spouse name: [name]  ? Number of children: 3  ? Years of education: Not on file  ? Highest education level: Not on file  ?Occupational History  ? 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  Comment: Used to work as Quarry manager at [employer]  ?Tobacco Use  ? Smoking status: Never  ? Smokeless tobacco: Never  ?Vaping Use  ? Vaping Use: Never used  ?  Substance and Sexual Activity  ? Alcohol use: No  ? Drug use: No  ? Sexual activity: Yes  ?  Birth control/protection: Surgical  ?Other Topics Concern  ? Not on file  ?Social History Narrative  ? Not on file  ? ?Social Determinants of Health  ? ?Financial Resource Strain: Not on file  ?Food Insecurity: Not on file  ?Transportation Needs: Not on file  ?Physical Activity: Not on file  ?Stress: Not on file  ?Social Connections: Not on file  ?Intimate Partner Violence: Not on file  ? ? ?Review of Systems ?Per hPI.  ? ?Objective:  ? ?Vitals:  ? 03/01/22 0830  ?BP: 116/66  ?Pulse: 66  ?Resp: 18  ?Temp: 97.7 ?F (36.5 ?C)  ?TempSrc: Temporal  ?SpO2: 99%  ?Weight: 174 lb 12.8 oz (79.3 kg)  ?Height: '5\' 1"'$  (1.549 m)  ? ? ? ?Physical Exam ?Vitals reviewed.  ?Constitutional:   ?   Appearance: Normal appearance. She is well-developed.  ?HENT:  ?   Head: Normocephalic and atraumatic.  ?Eyes:  ?   Conjunctiva/sclera: Conjunctivae normal.  ?   Pupils: Pupils are equal, round, and reactive to light.  ?Neck:  ?   Vascular: No carotid bruit.  ?Cardiovascular:  ?   Rate and Rhythm: Normal rate and regular rhythm.  ?   Heart  sounds: Normal heart sounds.  ?Pulmonary:  ?   Effort: Pulmonary effort is normal.  ?   Breath sounds: Normal breath sounds.  ?Abdominal:  ?   Palpations: Abdomen is soft. There is no pulsatile mass.  ?   Tender

## 2022-03-04 ENCOUNTER — Encounter: Payer: Self-pay | Admitting: Family Medicine

## 2022-03-05 ENCOUNTER — Ambulatory Visit (INDEPENDENT_AMBULATORY_CARE_PROVIDER_SITE_OTHER): Payer: 59 | Admitting: Adult Health

## 2022-03-05 ENCOUNTER — Ambulatory Visit: Payer: 59 | Admitting: Family Medicine

## 2022-03-05 ENCOUNTER — Encounter (INDEPENDENT_AMBULATORY_CARE_PROVIDER_SITE_OTHER): Payer: Self-pay | Admitting: Adult Health

## 2022-03-05 VITALS — BP 98/65 | HR 63 | Temp 97.8°F | Ht 61.0 in | Wt 169.0 lb

## 2022-03-05 DIAGNOSIS — R7303 Prediabetes: Secondary | ICD-10-CM | POA: Diagnosis not present

## 2022-03-05 DIAGNOSIS — K760 Fatty (change of) liver, not elsewhere classified: Secondary | ICD-10-CM | POA: Diagnosis not present

## 2022-03-05 DIAGNOSIS — Z6832 Body mass index (BMI) 32.0-32.9, adult: Secondary | ICD-10-CM | POA: Diagnosis not present

## 2022-03-05 DIAGNOSIS — E559 Vitamin D deficiency, unspecified: Secondary | ICD-10-CM | POA: Diagnosis not present

## 2022-03-05 DIAGNOSIS — Z905 Acquired absence of kidney: Secondary | ICD-10-CM

## 2022-03-05 DIAGNOSIS — E669 Obesity, unspecified: Secondary | ICD-10-CM | POA: Diagnosis not present

## 2022-03-06 ENCOUNTER — Encounter: Payer: Self-pay | Admitting: Internal Medicine

## 2022-03-06 ENCOUNTER — Telehealth: Payer: Self-pay | Admitting: Internal Medicine

## 2022-03-06 ENCOUNTER — Ambulatory Visit: Payer: 59 | Admitting: Internal Medicine

## 2022-03-06 VITALS — BP 118/74 | HR 70 | Ht 61.0 in | Wt 172.0 lb

## 2022-03-06 DIAGNOSIS — E063 Autoimmune thyroiditis: Secondary | ICD-10-CM | POA: Diagnosis not present

## 2022-03-06 DIAGNOSIS — E0500 Thyrotoxicosis with diffuse goiter without thyrotoxic crisis or storm: Secondary | ICD-10-CM | POA: Diagnosis not present

## 2022-03-06 DIAGNOSIS — E042 Nontoxic multinodular goiter: Secondary | ICD-10-CM

## 2022-03-06 DIAGNOSIS — E0590 Thyrotoxicosis, unspecified without thyrotoxic crisis or storm: Secondary | ICD-10-CM | POA: Diagnosis not present

## 2022-03-06 LAB — TSH: TSH: 2.07 u[IU]/mL (ref 0.35–5.50)

## 2022-03-06 LAB — T4, FREE: Free T4: 0.98 ng/dL (ref 0.60–1.60)

## 2022-03-06 MED ORDER — METHIMAZOLE 5 MG PO TABS: 2.5000 mg | ORAL_TABLET | ORAL | 2 refills | Status: DC

## 2022-03-06 NOTE — Patient Instructions (Signed)
Hold Biotin 3 days before your thyroid check ups ?

## 2022-03-06 NOTE — Telephone Encounter (Signed)
Please let the patient know that her thyroid function is normal and to continue to take methimazole 5 mg, half a tablet 4 days a week ? ? ? ?Thanks ?

## 2022-03-06 NOTE — Progress Notes (Signed)
? ?Name: [name]  ?MRN/ DOB: [id number], May 14, 1971    ?Age/ Sex: 51 y.o., female   ? ? ?PCP: Wendie Agreste, MD   ?Reason for Endocrinology Evaluation: Hyperthyroidism  ?   ?Initial Endocrinology Clinic Visit: 10/19/2021  ? ? ?PATIENT IDENTIFIER: [name] is a 51 y.o., female with a past medical history of hyperthyroidism, Asthma and Hx of right nephrectomy for benign reasons. She has followed with [facility] since 10/19/2021 for consultative assistance with management of her hyperthyroidism.  ? ?HISTORICAL SUMMARY:  ?She has been noted with suppressed TSh at 0.00 uIU/mL in 09/2021 ( no concommitent FT4at the time) Repeat testing confirmed a TSH 0.01 uIU/mL with normal FT4 at 0.8 ng/dL and normal FT3 at 4.2 pg/mL  ?  ?She was also found to have slight elevation of Anti-TPO Abs at 16 IU/mL  ?TRAB slightly elevated at 2.66 IU/L  ? ?She was started on methimazole 10/2021 ?  ?Denies radiation exposure  ? ?Thyroid ultrasound 10/20/2021 showed multinodular goiter ?  ?Mother and sister with thyroid disease  ?  ?SUBJECTIVE:  ? ? ?Today (03/06/2022):  [name] is here for hyperthyroidism. ? ?She continues to follow-up with the community weight and wellness, dietary changes have been discussed with the patient through them ? ?Energy has improved with exercise and diet  ?No local neck enlargement  ?Palpitations have improved  ?She has  burry vision and at times watery eyes , she is on [place] , last eye exam 4, 2023 ? ?She has recurrent UTI and is on preventative  ? ? ?Methimazole 5 mg, half a tablet four days a week  ( Monday through Thursday )  ? ? ?HISTORY:  ?Past Medical History:  ?Past Medical History:  ?Diagnosis Date  ? Arthritis   ? Asthma   ? Complication of anesthesia   ? respirations slow down after coming out from Anesthesia  ? Endometriosis   ? GERD (gastroesophageal reflux disease)   ? H/O right nephrectomy   ? Mass of right kidney   ? Other fatigue   ?  Palpitations   ? Post-operative nausea and vomiting   ? Shortness of breath on exertion   ? ?Past Surgical History:  ?Past Surgical History:  ?Procedure Laterality Date  ? ABDOMINAL HYSTERECTOMY    ? CERVICAL DISC SURGERY    ? CESAREAN SECTION    ? CHOLECYSTECTOMY    ? COLONOSCOPY  12/17/2017  ? Dr.Stark  ? EUS N/A 03/14/2017  ? Procedure: UPPER ENDOSCOPIC ULTRASOUND (EUS) LINEAR;  Surgeon: Milus Banister, MD;  Location: [facility];  Service: Endoscopy;  Laterality: N/A;  ? I & D EXTREMITY Left 05/26/2021  ? Procedure: IRRIGATION AND DEBRIDEMENT EXTREMITY WITH COMPLEX CLOSURE OF WOUND;  Surgeon: Meredith Pel, MD;  Location: [place];  Service: Orthopedics;  Laterality: Left;  ? KNEE ARTHROSCOPY Left 05/26/2021  ? Procedure: ARTHROSCOPIC KNEE WASHOUT;  Surgeon: Meredith Pel, MD;  Location: [place];  Service: Orthopedics;  Laterality: Left;  ? LAPAROSCOPIC ABDOMINAL EXPLORATION N/A 05/17/2017  ? Procedure: LAPAROSCOPIC POSSIBLE OPEN REMOVAL OF STOMACH MASS;  Surgeon: Jackolyn Confer, MD;  Location: [facility];  Service: General;  Laterality: N/A;  ? LAPAROSCOPIC NEPHRECTOMY Right 05/17/2017  ? Procedure: RIGHT RADICAL NEPHRECTOMY, URETERECTOMY;  Surgeon: Ardis Hughs, MD;  Location: [facility];  Service: Urology;  Laterality: Right;  ? tubal preganancy    ? ?Social History:  reports that she has never smoked. She has never used smokeless tobacco. She  reports that she does not drink alcohol and does not use drugs. ?Family History:  ?Family History  ?Problem Relation Age of Onset  ? Alcoholism Mother   ? Thyroid disease Mother   ? Stroke Mother   ? Hypertension Mother   ? Cancer Mother 34  ?     "back cancer"  ? Other Mother   ?     hysterectomy at 62 for "pre-cancerous cervical cells"  ? Obesity Father   ? Cancer Father   ? Hypertension Father   ? Colon cancer Father 29  ? Cervical cancer Sister   ?     full sister dx. unspecified age  ? Colon polyps Sister   ?     unspecified number  ? Colon polyps Sister    ?     paternal half-sister w/ colon polyps dx. age 80 or younger - unspecified number  ? Colon cancer Sister 7  ?     paternal half-sister   ? Colon polyps Sister   ?     paternal half-sister; unspecified number  ? Colon cancer Sister   ?     paternal half-sister dx. before age 40; has had two colon cancers  ? Breast cancer Sister   ?     paternal half-sister dx under age 54  ? Renal cancer Sister 50  ?     paternal half-sister dx. w/ "[facility] - lymphoid renal cancer"  ? Cancer Sister 56  ?     paternal half-sister dx cancer of wrist that was not a skin cancer  ? Breast cancer Maternal Grandmother   ?     unspecified age  ? Diabetes Maternal Grandmother   ? Diabetes Paternal Grandfather   ? Colon polyps Son 38  ?     has had a "bleeding polyp"  ? Colon cancer Paternal Aunt   ?     (x2) paternal aunts with colon cancer at unspecified ages; lim info  ? Breast cancer Paternal Aunt   ?     (x3) paternal aunts with breast cancer at unspecified ages; lim info  ? Colon cancer Paternal Uncle   ?     dx. unspecified age; lim info  ? Cervical cancer Other 34  ?     niece; +hysterectomy  ? Alcoholism Other   ? Obesity Other   ? Esophageal cancer Neg Hx   ? Stomach cancer Neg Hx   ? ? ? ?HOME MEDICATIONS: ?Allergies as of 03/06/2022   ? ?   Reactions  ? Amoxicillin Anaphylaxis  ? All "cillins" pt was 75-69 years old  ?Has patient had a PCN reaction causing immediate rash, facial/tongue/throat swelling, SOB or lightheadedness with hypotension: yes ?Has patient had a PCN reaction causing severe rash involving mucus membranes or skin necrosis: unknown ?Has patient had a PCN reaction that required hospitalization yes ?Has patient had a PCN reaction occurring within the last 10 years:no ?If all of the above answers are "NO", then may proceed with Cephalosporin use.  ? Ibuprofen Anaphylaxis, Hives  ? Keflex [cephalexin] Anaphylaxis  ? Penicillins Anaphylaxis  ? Has patient had a PCN reaction causing immediate rash, facial/tongue/throat  swelling, SOB or lightheadedness with hypotension:yes ?Has patient had a PCN reaction causing severe rash involving mucus membranes or skin necrosis: unknown ?Has patient had a PCN reaction that required hospitalization yes  ?Has patient had a PCN reaction occurring within the last 10 years: no ?If all of the above answers are "NO", then may  proceed with Cephalosporin use.  ? Baby Powder [methylbenzethonium] Hives  ? Doxycycline Diarrhea, Rash  ? ?  ? ?  ?Medication List  ?  ? ?  ? Accurate as of Mar 06, 2022  3:26 PM. If you have any questions, ask your nurse or doctor.  ?  ?  ? ?  ? ?albuterol 108 (90 Base) MCG/ACT inhaler ?Commonly known as: Proventil HFA ?Inhale 2 puffs into the lungs every 6 (six) hours as needed for wheezing or shortness of breath. ?  ?Cequa 0.09 % Soln ?Generic drug: cycloSPORINE (PF) ?Apply 1 drop to eye 2 (two) times daily. ?  ?furosemide 20 MG tablet ?Commonly known as: LASIX ?Take 0.5 tablets (10 mg total) by mouth daily. ?  ?methimazole 5 MG tablet ?Commonly known as: TAPAZOLE ?Take 0.5 tablets (2.5 mg total) by mouth as directed. Half a tablet 4 days a week ?  ?Mounjaro 5 MG/0.5ML Pen ?Generic drug: tirzepatide ?Inject 5 mg into the skin once a week. ?  ?NON FORMULARY ?Cultrelle ?  ?ondansetron 4 MG tablet ?Commonly known as: Zofran ?Take 1 tablet (4 mg total) by mouth every 8 (eight) hours as needed for nausea or vomiting. ?  ?pantoprazole 40 MG tablet ?Commonly known as: PROTONIX ?Take 1 tablet (40 mg total) by mouth 2 (two) times daily. ?  ?Valium 10 MG tablet ?Generic drug: diazepam ?10 mg at bedtime. ?  ?Vitamin D3 25 MCG (1000 UT) Caps ?Take 2 capsules (2,000 Units total) by mouth daily. ?  ? ?  ? ? ? ? ?OBJECTIVE:  ? ?PHYSICAL EXAM: ?VS: BP 118/74 (BP Location: Left Arm, Patient Position: Sitting, Cuff Size: Small)   Pulse 70   Ht '5\' 1"'$  (1.549 m)   Wt 172 lb (78 kg)   SpO2 95%   BMI 32.50 kg/m?   ? ?EXAM: ?General: Pt appears well and is in NAD  ?Eyes: External eye exam  normal and minimal stare on the left , but no lid lag or exophthalmos.  EOM intact.  PERRL.  ?Neck: General: Supple without adenopathy. ?Thyroid: Thyroid size normal.  No goiter or nodules appreciated.   ?Lungs: C

## 2022-03-06 NOTE — Telephone Encounter (Signed)
Patient notified

## 2022-03-07 LAB — T3: T3, Total: 95 ng/dL (ref 76–181)

## 2022-03-12 NOTE — Progress Notes (Signed)
? ? ? ?Chief Complaint:  ? ?OBESITY ?[name] is here to discuss her progress with her obesity treatment plan along with follow-up of her obesity related diagnoses. [name] is on keeping a food journal and adhering to recommended goals of [phone] calories and 75 grams of protein daily and states she is following her eating plan approximately 100% of the time. [name] states she is walking, weights, and bands for 60+ minutes 7 times per week. ? ?Today's visit was #: 7 ?Starting weight: 190 lbs ?Starting date: 11/29/2021 ?Today's weight: 171 lbs ?Today's date: 02/22/2022 ?Total lbs lost to date: 82 ?Total lbs lost since last in-office visit: 6 ? ?Interim History: No issues with the meal plan. Per patient, hitting calorie and protein goals 100% of the time. No cravings (physically), no hunger. Patient's goal is 20,000 steps per day and she hits it most days. She did not bring in her journaling log. ? ?Subjective:  ? ?1. Pre-diabetes ?Getting Mounjaro for fasting insulin and weight loss doctor at 5 mg and unable to get the 7.5 mg dose. Helps her stay on track. ? ?2. NAFLD (nonalcoholic fatty liver disease) ?Elevated ALT in the past at 42 approximately 4+ months ago.  ? ?3. Vitamin D deficiency ?[name] started her OTC Vit D dose after lab in January with Vitamin D at just over 30. Now on 2,000 IU daily. ? ?4. At risk for dehydration ?[name] is at risk for dehydration due to increased exercise and increased protein in diet. ? ?Assessment/Plan:  ? ?Orders Placed This Encounter  ?Procedures  ? Comprehensive metabolic panel  ? VITAMIN D 25 Hydroxy (Vit-D Deficiency, Fractures)  ? Hemoglobin A1c  ? ? ?There are no discontinued medications.  ? ?No orders of the defined types were placed in this encounter. ?  ? ?1. Pre-diabetes ?We will check labs today. Last checked 4+ months ago at 6.3. Medication management per telehealth doctor. Follow PNP and weight loss, and increase exercise.  ? ?- Hemoglobin A1c ? ?2. NAFLD (nonalcoholic fatty liver  disease) ?We will check labs today, and will follow up at her next office visit. ? ?- Comprehensive metabolic panel ? ?3. Vitamin D deficiency ?We will recheck Vit D level today. She will continue OTC Vitamin D 2,000 IU daily. ? ?- VITAMIN D 25 Hydroxy (Vit-D Deficiency, Fractures) ? ?4. At risk for dehydration ?[name] was given approximately 9 minutes of dehydration prevention counseling today. [name] is at risk for dehydration due to weight loss and current medication(s). She was encouraged to hydrate and monitor fluid status to avoid dehydration as weight loss plateaus. ? ?5. Obesity, currtent BMI 32.4 ?Little is currently in the action stage of change. As such, her goal is to continue with weight loss efforts. She has agreed to keeping a food journal and adhering to recommended goals of [phone] calories and 75+ grams of protein daily.  ? ?Go over labs, A1c, vit D, CMP, with the patient at next office visit. ? ?Exercise goals: As is. ? ?Behavioral modification strategies: planning for success and keeping a strict food journal. ? ?[name] has agreed to follow-up with our clinic in 2 to 3 weeks. She was informed of the importance of frequent follow-up visits to maximize her success with intensive lifestyle modifications for her multiple health conditions.  ? ?[name] was informed we would discuss her lab results at her next visit unless there is a critical issue that needs to be addressed sooner. [name] agreed to keep her next visit at the agreed upon  time to discuss these results. ? ?Objective:  ? ?Blood pressure 106/71, pulse 60, temperature 98 ?F (36.7 ?C), height '5\' 1"'$  (1.549 m), weight 171 lb (77.6 kg), SpO2 99 %. ?Body mass index is 32.31 kg/m?. ? ?General: Cooperative, alert, well developed, in no acute distress. ?HEENT: Conjunctivae and lids unremarkable. ?Cardiovascular: Regular rhythm.  ?Lungs: Normal work of breathing. ?Neurologic: No focal deficits.  ? ?Lab Results  ?Component Value Date  ? CREATININE 0.99  02/22/2022  ? BUN 17 02/22/2022  ? NA 144 02/22/2022  ? K 4.9 02/22/2022  ? CL 103 02/22/2022  ? CO2 23 02/22/2022  ? ?Lab Results  ?Component Value Date  ? ALT 60 (H) 02/22/2022  ? AST 30 02/22/2022  ? ALKPHOS 105 02/22/2022  ? BILITOT 0.4 02/22/2022  ? ?Lab Results  ?Component Value Date  ? HGBA1C 5.7 (H) 02/22/2022  ? HGBA1C 6.3 09/26/2021  ? ?No results found for: INSULIN ?Lab Results  ?Component Value Date  ? TSH 2.07 03/06/2022  ? ?Lab Results  ?Component Value Date  ? CHOL 203 (H) 09/26/2021  ? HDL 47.20 09/26/2021  ? LDLCALC 129 (H) 09/26/2021  ? TRIG 135.0 09/26/2021  ? CHOLHDL 4 09/26/2021  ? ?Lab Results  ?Component Value Date  ? VD25OH 55.0 02/22/2022  ? VD25OH 30.3 11/29/2021  ? ?Lab Results  ?Component Value Date  ? WBC 6.9 11/16/2021  ? HGB 13.5 11/16/2021  ? HCT 43.2 11/16/2021  ? MCV 81.4 11/16/2021  ? PLT 327.0 11/16/2021  ? ?No results found for: IRON, TIBC, FERRITIN ? ?Attestation Statements:  ? ?Reviewed by clinician on day of visit: allergies, medications, problem list, medical history, surgical history, family history, social history, and previous encounter notes. ? ? ?I, Trixie Dredge, am acting as transcriptionist for [employer], DO. ? ?I have reviewed the above documentation for accuracy and completeness, and I agree with the above. [name], D.O. ? ?The [place] was signed into law in 2016 which includes the topic of electronic health records.  This provides immediate access to information in MyChart.  This includes consultation notes, operative notes, office notes, lab results and pathology reports.  If you have any questions about what you read please let us know at your next visit so we can discuss your concerns and take corrective action if need be.  We are right here with you. ? ? ?

## 2022-03-13 NOTE — Progress Notes (Signed)
? ? ? ?Chief Complaint:  ? ?OBESITY ?[name] is here to discuss her progress with her obesity treatment plan along with follow-up of her obesity related diagnoses. [name] is on keeping a food journal and adhering to recommended goals of 900-100 calories and 75 gms of protein and states she is following her eating plan approximately 100% of the time. [name] states she is walking 150 minutes 7 times per week (20,000 steps). ? ?Today's visit was #: 8 ?Starting weight: 190 ?Starting date: 11/29/21 ?Today's weight: 169 lbs ?Today's date: 03/05/2022 ?Total lbs lost to date: 21 lbs ?Total lbs lost since last in-office visit: -2 ? ?Interim History:  ?[name] fell off of a ladder while hanging pictures and she has been less active while recovering from fall injury. ?She has not had any imaging s/p fall. ?She will start increased dose of Mounjaro (7.5 mg) on 03/23/2022-managed by telemedicine provider- [place] [facility]. ? ?Subjective:  ? ?1. Pre-diabetes ?Lab results were discussed with the patient today. ?02/22/22: A1c 5.7, BG 101-These were both nonfasting. ? ?2. NAFLD (nonalcoholic fatty liver disease) ?Lab results were discussed with the patient today. ?02/22/22 CMP: LFTs-AST 30, ALT 60. ?She denies right upper quadrant pain. ? ?3. Vitamin D deficiency ?Lab results were discussed with the patient today. ?02/22/2022 vitamin D level-55.0 ?She is on OTC vitamin D3 2000 IU daily. ? ?4. H/O right nephrectomy ?Lab results were discussed with the patient today. ?She should drink 3 L of water per day per nephrology. ?02/22/22 CMP: Creatinine 0.99, GFR 60- improved! ? ? ?Assessment/Plan:  ? ?1. Pre-diabetes ?Continue Mounjaro per telemedicine provider- [place] [facility]. ? ?2. NAFLD (nonalcoholic fatty liver disease) ?Continue to avoid hepatotoxic substances. ? ?3. Vitamin D deficiency ?Continue OTC supplementation. ? ?4. H/O right nephrectomy ?Continue with specialist as directed. ? ?5. Obesity, current BMI 32.0 ? ?[name] is currently in the  action stage of change. As such, her goal is to continue with weight loss efforts. She has agreed to keeping a food journal and adhering to recommended goals of [phone] calories and 75 gms protein daily. ? ?Exercise goals:  Continue current regimen. ? ?Behavioral modification strategies: increasing lean protein intake, decreasing simple carbohydrates, meal planning and cooking strategies, keeping healthy foods in the home, and planning for success. ? ?[name] has agreed to follow-up with our clinic in 3 weeks. She was informed of the importance of frequent follow-up visits to maximize her success with intensive lifestyle modifications for her multiple health conditions.  ? ?Objective:  ? ?Blood pressure 98/65, pulse 63, temperature 97.8 ?F (36.6 ?C), height '5\' 1"'$  (1.549 m), weight 169 lb (76.7 kg), SpO2 97 %. ?Body mass index is 31.93 kg/m?. ? ?General: Cooperative, alert, well developed, in no acute distress. ?HEENT: Conjunctivae and lids unremarkable. ?Cardiovascular: Regular rhythm.  ?Lungs: Normal work of breathing. ?Neurologic: No focal deficits.  ? ?Lab Results  ?Component Value Date  ? CREATININE 0.99 02/22/2022  ? BUN 17 02/22/2022  ? NA 144 02/22/2022  ? K 4.9 02/22/2022  ? CL 103 02/22/2022  ? CO2 23 02/22/2022  ? ?Lab Results  ?Component Value Date  ? ALT 60 (H) 02/22/2022  ? AST 30 02/22/2022  ? ALKPHOS 105 02/22/2022  ? BILITOT 0.4 02/22/2022  ? ?Lab Results  ?Component Value Date  ? HGBA1C 5.7 (H) 02/22/2022  ? HGBA1C 6.3 09/26/2021  ? ?No results found for: INSULIN ?Lab Results  ?Component Value Date  ? TSH 2.07 03/06/2022  ? ?Lab Results  ?Component Value Date  ?  CHOL 203 (H) 09/26/2021  ? HDL 47.20 09/26/2021  ? LDLCALC 129 (H) 09/26/2021  ? TRIG 135.0 09/26/2021  ? CHOLHDL 4 09/26/2021  ? ?Lab Results  ?Component Value Date  ? VD25OH 55.0 02/22/2022  ? VD25OH 30.3 11/29/2021  ? ?Lab Results  ?Component Value Date  ? WBC 6.9 11/16/2021  ? HGB 13.5 11/16/2021  ? HCT 43.2 11/16/2021  ? MCV 81.4  11/16/2021  ? PLT 327.0 11/16/2021  ? ?No results found for: IRON, TIBC, FERRITIN ? ?Obesity Behavioral Intervention:  ? ?Approximately 15 minutes were spent on the discussion below. ? ?ASK: ?We discussed the diagnosis of obesity with [name] today and [name] agreed to give [place] permission to discuss obesity behavioral modification therapy today. ? ?ASSESS: ?[name] has the diagnosis of obesity and her BMI today is 32.0. [name] is in the action stage of change.  ? ?ADVISE: ?[name] was educated on the multiple health risks of obesity as well as the benefit of weight loss to improve her health. She was advised of the need for long term treatment and the importance of lifestyle modifications to improve her current health and to decrease her risk of future health problems. ? ?AGREE: ?Multiple dietary modification options and treatment options were discussed and [name] agreed to follow the recommendations documented in the above note. ? ?ARRANGE: ?[name] was educated on the importance of frequent visits to treat obesity as outlined per CMS and USPSTF guidelines and agreed to schedule her next follow up appointment today. ? ?Attestation Statements:  ? ?Reviewed by clinician on day of visit: allergies, medications, problem list, medical history, surgical history, family history, social history, and previous encounter notes. ? ?I, Georgianne Fick, FNP, am acting as Location manager for Mina Marble, NP. ? ?I have reviewed the above documentation for accuracy and completeness, and I agree with the above. -  Kamali Sakata d. Khylen Riolo, NP-C  ?

## 2022-03-26 ENCOUNTER — Ambulatory Visit (INDEPENDENT_AMBULATORY_CARE_PROVIDER_SITE_OTHER): Payer: 59 | Admitting: Family Medicine

## 2022-03-26 ENCOUNTER — Encounter (INDEPENDENT_AMBULATORY_CARE_PROVIDER_SITE_OTHER): Payer: Self-pay | Admitting: Family Medicine

## 2022-03-26 VITALS — BP 105/70 | HR 83 | Temp 98.6°F | Ht 61.0 in | Wt 165.0 lb

## 2022-03-26 DIAGNOSIS — Z6832 Body mass index (BMI) 32.0-32.9, adult: Secondary | ICD-10-CM

## 2022-03-26 DIAGNOSIS — K219 Gastro-esophageal reflux disease without esophagitis: Secondary | ICD-10-CM | POA: Diagnosis not present

## 2022-03-26 DIAGNOSIS — E559 Vitamin D deficiency, unspecified: Secondary | ICD-10-CM | POA: Diagnosis not present

## 2022-03-26 DIAGNOSIS — E669 Obesity, unspecified: Secondary | ICD-10-CM

## 2022-03-26 DIAGNOSIS — Z9189 Other specified personal risk factors, not elsewhere classified: Secondary | ICD-10-CM

## 2022-03-26 DIAGNOSIS — R7303 Prediabetes: Secondary | ICD-10-CM

## 2022-03-27 ENCOUNTER — Ambulatory Visit (INDEPENDENT_AMBULATORY_CARE_PROVIDER_SITE_OTHER): Payer: 59 | Admitting: Family Medicine

## 2022-03-28 ENCOUNTER — Ambulatory Visit (INDEPENDENT_AMBULATORY_CARE_PROVIDER_SITE_OTHER): Payer: 59 | Admitting: Family Medicine

## 2022-04-01 NOTE — Progress Notes (Unsigned)
Chief Complaint:   OBESITY [name] is here to discuss her progress with her obesity treatment plan along with follow-up of her obesity related diagnoses. [name] is on following a lower carbohydrate, vegetable and lean protein rich diet plan and states she is following her eating plan approximately 50% of the time. [name] states she is walking or lifting weights 60 to 120 minutes 3 times per week.   Today's visit was #: 9 Starting weight: 190 lbs Starting date: 11/29/2021 Today's weight: 165 lbs Today's date: 03/26/2022 Total lbs lost to date: 25 Total lbs lost since last in-office visit: 4  Interim History: [name] has been on vacation for 3 weeks. She is not sure how she lost weight. [name] is lifting weights 5 days a week for 1 hour. She is drinking 2 liters of water intake per day. [name] denies hunger or cravings.  Subjective:   1. Pre-diabetes [name] is on [place] per her doctor in [place]. She was increased to 7.'5mg'$  recently and is tolerating it well. [name] was able to eat without issues and she gets in her protein goals each day.  2. Vitamin D deficiency [name] is on OTC vitamin D 2,000IU every day. Her vitamin D is at goal of 12 recently.  3. Gastroesophageal reflux disease, unspecified whether esophagitis present [name] has been on Protonix for several years now.  Her symptoms are not worse with Mounjaro. [name]'s GI, Dr. Fuller Plan follows her closely. Due to her family history, she gets a colonoscopy yearly and EGD every other year.  4. At risk for dehydration [name] is at risk for dehydration due to her decreased intake recently with travel.  Assessment/Plan:  No orders of the defined types were placed in this encounter.   Medications Discontinued During This Encounter  Medication Reason   tirzepatide [facility]) 5 MG/0.5ML Pen      No orders of the defined types were placed in this encounter.    1. Pre-diabetes [name]'s A1c is improving from 6.3 to 5.7. [name] will continue to work on  weight loss, exercise, and decreasing simple carbohydrates to help decrease the risk of diabetes.   2. Vitamin D deficiency Low Vitamin D level contributes to fatigue and are associated with obesity, breast, and colon cancer. She agrees to continue to take her OTC supplement and will follow-up for routine testing of Vitamin D, at least 2-3 times per year to avoid over-replacement.  3. Gastroesophageal reflux disease, unspecified whether esophagitis present [name] agree to continue her current medications and to follow up as directed.  4. At risk for dehydration [name] was given approximately 9 minutes of dehydration prevention counseling today. [name] is at risk for dehydration due to weight loss and current medication(s). She was encouraged to hydrate and monitor fluid status to avoid dehydration as weight loss plateaus.   5. Obesity, current BMI 32.3 [name] is currently in the action stage of change. As such, her goal is to continue with weight loss efforts. She has agreed to keeping a food journal and adhering to recommended goals of 900 to 1000 calories and 75 grams of protein.   Exercise goals:  As is.  Behavioral modification strategies: travel eating strategies and planning for success.  [name] has agreed to follow-up with our clinic in 2 weeks. She was informed of the importance of frequent follow-up visits to maximize her success with intensive lifestyle modifications for her multiple health conditions.   Objective:   Blood pressure 105/70, pulse 83, temperature 98.6 F (37 C),  height '5\' 1"'$  (1.549 m), weight 165 lb (74.8 kg), SpO2 98 %. Body mass index is 31.18 kg/m.  General: Cooperative, alert, well developed, in no acute distress. HEENT: Conjunctivae and lids unremarkable. Cardiovascular: Regular rhythm.  Lungs: Normal work of breathing. Neurologic: No focal deficits.   Lab Results  Component Value Date   CREATININE 0.99 02/22/2022   BUN 17 02/22/2022   NA 144 02/22/2022   K  4.9 02/22/2022   CL 103 02/22/2022   CO2 23 02/22/2022   Lab Results  Component Value Date   ALT 60 (H) 02/22/2022   AST 30 02/22/2022   ALKPHOS 105 02/22/2022   BILITOT 0.4 02/22/2022   Lab Results  Component Value Date   HGBA1C 5.7 (H) 02/22/2022   HGBA1C 6.3 09/26/2021   No results found for: INSULIN Lab Results  Component Value Date   TSH 2.07 03/06/2022   Lab Results  Component Value Date   CHOL 203 (H) 09/26/2021   HDL 47.20 09/26/2021   LDLCALC 129 (H) 09/26/2021   TRIG 135.0 09/26/2021   CHOLHDL 4 09/26/2021   Lab Results  Component Value Date   VD25OH 55.0 02/22/2022   VD25OH 30.3 11/29/2021   Lab Results  Component Value Date   WBC 6.9 11/16/2021   HGB 13.5 11/16/2021   HCT 43.2 11/16/2021   MCV 81.4 11/16/2021   PLT 327.0 11/16/2021   No results found for: IRON, TIBC, FERRITIN  Attestation Statements:   Reviewed by clinician on day of visit: allergies, medications, problem list, medical history, surgical history, family history, social history, and previous encounter notes.  IMarcille Blanco, CMA, am acting as transcriptionist for [employer], DO  I have reviewed the above documentation for accuracy and completeness, and I agree with the above. [name], D.O.  The [place] was [place] into law in 2016 which includes the topic of electronic health records.  This provides immediate access to information in MyChart.  This includes consultation notes, operative notes, office notes, lab results and pathology reports.  If you have any questions about what you read please let us know at your next visit so we can discuss your concerns and take corrective action if need be.  We are right here with you.

## 2022-04-09 ENCOUNTER — Ambulatory Visit (INDEPENDENT_AMBULATORY_CARE_PROVIDER_SITE_OTHER): Payer: 59 | Admitting: Family Medicine

## 2022-04-09 ENCOUNTER — Encounter (INDEPENDENT_AMBULATORY_CARE_PROVIDER_SITE_OTHER): Payer: Self-pay | Admitting: Family Medicine

## 2022-04-09 VITALS — BP 113/76 | HR 69 | Temp 97.9°F | Ht 61.0 in | Wt 162.0 lb

## 2022-04-09 DIAGNOSIS — E668 Other obesity: Secondary | ICD-10-CM | POA: Diagnosis not present

## 2022-04-09 DIAGNOSIS — R7303 Prediabetes: Secondary | ICD-10-CM

## 2022-04-09 DIAGNOSIS — Z6830 Body mass index (BMI) 30.0-30.9, adult: Secondary | ICD-10-CM

## 2022-04-09 DIAGNOSIS — E559 Vitamin D deficiency, unspecified: Secondary | ICD-10-CM | POA: Diagnosis not present

## 2022-04-09 DIAGNOSIS — K760 Fatty (change of) liver, not elsewhere classified: Secondary | ICD-10-CM

## 2022-04-09 DIAGNOSIS — E669 Obesity, unspecified: Secondary | ICD-10-CM

## 2022-04-09 DIAGNOSIS — Z9189 Other specified personal risk factors, not elsewhere classified: Secondary | ICD-10-CM

## 2022-04-14 NOTE — Progress Notes (Unsigned)
Chief Complaint:   OBESITY [name] is here to discuss her progress with her obesity treatment plan along with follow-up of her obesity related diagnoses. [name] is on keeping a food journal and adhering to recommended goals of [phone] calories and 75 grams protein and states she is following her eating plan approximately 50% of the time. [name] states she is walking and weight training 120 minutes 4 times per week.  Today's visit was #: 10 Starting weight: 190 lbs Starting date: 11/29/2021 Today's weight: 162 lbs Today's date: 04/09/2022 Total lbs lost to date: 28 Total lbs lost since last in-office visit: 3  Interim History: [name] had a GI bug for 7 days in the last 2 weeks. She vomited 3 times and has loose stools for the whole 7 days. Due to her illness, pt only journaled 50% of the time; pt endorses hitting goals.  Subjective:   1. Pre-diabetes [name]'s last A1c was 5.7.  2. NAFLD (nonalcoholic fatty liver disease) She is not on any supplements, denies alcohol consumption, and is not taking any hepatotoxic substances or Tylenol.  3. Vitamin D deficiency [name]'s Vit D level was 55 one month ago. Medication: OTC Vit D 2,000 IU daily  4. At risk for constipation [name] is at risk for constipation due to increasing her protein intake.  Assessment/Plan:  No orders of the defined types were placed in this encounter.   There are no discontinued medications.   No orders of the defined types were placed in this encounter.    1. Pre-diabetes [name] will continue to work on weight loss, exercise, and decreasing simple carbohydrates to help decrease the risk of diabetes. Continue Mounjaro and prudent nutritional plan.  2. NAFLD (nonalcoholic fatty liver disease) [name]'s ALT was 60 a month ago. - pathophysiology of disease discussed with [name]. Elevated liver transaminases with an ALT predominance combined with obesity and insulin resistance is characteristic, but not  diagnostic of non-alcoholic fatty liver disease (NAFLD). NAFLD is the 2nd leading cause of liver transplant in adults. Treatment includes weight loss, elimination of sweet drinks, including juice, avoidance of high fructose corn syrup, and exercise. As always, avoiding alcohol consumption is important. - Treatment goal: 7-10% reduction of body weight.   - Aerobic activity and resistance training are important to help achieve a healthy body weight BUT also increases peripheral insulin sensitivity, reducing delivery of free fatty acids and glucose to the liver.  Also recommended exercise to increase intrahepatic fatty acid oxidation, decrease fatty acid synthesis, and help prevent mitochondrial and hepatocellular damage. - Medications that can help reduce hepatic fat include metformin and GLP-1's, which were discussed with pt and prescribed if pt agreed to treatment.  Continue weight loss via prudent nutritional plan.  3. Vitamin D deficiency At goal. Low Vitamin D level contributes to fatigue and are associated with obesity, breast, and colon cancer. She agrees to continue to take OTC Vitamin D 2,000 IU daily and will follow-up for routine testing of Vitamin D, at least 2-3 times per year to avoid over-replacement.  4. At risk for constipation [name] is at increased risk for constipation due to inadequate water intake, changes in diet, and/or use of certain medications. Patient was provided with 10 minutes of counseling today regarding this condition and related ones. We discussed preventative OTC therapies that exist such as MiraLAX, stool softeners, etc.   We also discussed the importance of adequate fiber intake and for patient to drink 1/2 weight in ounces  of water per day, unless told otherwise by a Cardiologist, Nephrologist, or another medical provider.    5. Class 1 obesity with serious comorbidity and body mass index (BMI) of 30.7 [name] is currently in the action stage of  change. As such, her goal is to continue with weight loss efforts. She has agreed to keeping a food journal and adhering to recommended goals of 1000 calories and 100+ grams protein.   Exercise goals:  As is  Behavioral modification strategies: increasing lean protein intake, planning for success, and keeping a strict food journal.  Aryan has agreed to follow-up with our clinic in 3 weeks. She was informed of the importance of frequent follow-up visits to maximize her success with intensive lifestyle modifications for her multiple health conditions.   Objective:   Blood pressure 113/76, pulse 69, temperature 97.9 F (36.6 C), height '5\' 1"'$  (1.549 m), weight 162 lb (73.5 kg), SpO2 99 %. Body mass index is 30.61 kg/m.  General: Cooperative, alert, well developed, in no acute distress. HEENT: Conjunctivae and lids unremarkable. Cardiovascular: Regular rhythm.  Lungs: Normal work of breathing. Neurologic: No focal deficits.   Lab Results  Component Value Date   CREATININE 0.99 02/22/2022   BUN 17 02/22/2022   NA 144 02/22/2022   K 4.9 02/22/2022   CL 103 02/22/2022   CO2 23 02/22/2022   Lab Results  Component Value Date   ALT 60 (H) 02/22/2022   AST 30 02/22/2022   ALKPHOS 105 02/22/2022   BILITOT 0.4 02/22/2022   Lab Results  Component Value Date   HGBA1C 5.7 (H) 02/22/2022   HGBA1C 6.3 09/26/2021   No results found for: "INSULIN" Lab Results  Component Value Date   TSH 2.07 03/06/2022   Lab Results  Component Value Date   CHOL 203 (H) 09/26/2021   HDL 47.20 09/26/2021   LDLCALC 129 (H) 09/26/2021   TRIG 135.0 09/26/2021   CHOLHDL 4 09/26/2021   Lab Results  Component Value Date   VD25OH 55.0 02/22/2022   VD25OH 30.3 11/29/2021   Lab Results  Component Value Date   WBC 6.9 11/16/2021   HGB 13.5 11/16/2021   HCT 43.2 11/16/2021   MCV 81.4 11/16/2021   PLT 327.0 11/16/2021    Attestation Statements:   Reviewed by clinician on day of visit: allergies,  medications, problem list, medical history, surgical history, family history, social history, and previous encounter notes.  I, [name], BS, CMA, am acting as transcriptionist for [employer], DO.  I have reviewed the above documentation for accuracy and completeness, and I agree with the above. [name], D.O.  The [place] was signed into law in 2016 which includes the topic of electronic health records.  This provides immediate access to information in MyChart.  This includes consultation notes, operative notes, office notes, lab results and pathology reports.  If you have any questions about what you read please let us know at your next visit so we can discuss your concerns and take corrective action if need be.  We are right here with you.

## 2022-04-23 ENCOUNTER — Ambulatory Visit (INDEPENDENT_AMBULATORY_CARE_PROVIDER_SITE_OTHER): Payer: 59 | Admitting: Adult Health

## 2022-04-23 ENCOUNTER — Encounter (INDEPENDENT_AMBULATORY_CARE_PROVIDER_SITE_OTHER): Payer: Self-pay | Admitting: Adult Health

## 2022-04-23 VITALS — BP 108/74 | HR 75 | Temp 98.0°F | Ht 61.0 in | Wt 159.0 lb

## 2022-04-23 DIAGNOSIS — E559 Vitamin D deficiency, unspecified: Secondary | ICD-10-CM

## 2022-04-23 DIAGNOSIS — Z6830 Body mass index (BMI) 30.0-30.9, adult: Secondary | ICD-10-CM | POA: Diagnosis not present

## 2022-04-23 DIAGNOSIS — E66811 Obesity, class 1: Secondary | ICD-10-CM

## 2022-04-23 DIAGNOSIS — E669 Obesity, unspecified: Secondary | ICD-10-CM | POA: Diagnosis not present

## 2022-04-24 NOTE — Progress Notes (Signed)
Chief Complaint:   OBESITY [name] is here to discuss her progress with her obesity treatment plan along with follow-up of her obesity related diagnoses. [name] is on keeping a food journal and adhering to recommended goals of 1000 calories and 100+ grams of protein daily and states she is following her eating plan approximately 100% of the time. [name] states she is walking, treadmill, lifts weights for 2.5 hours 6 times per week.  Today's visit was #: 11 Starting weight: 190 lbs Starting date: 11/29/2021 Today's weight: 159 lbs Today's date: 04/23/2022 Total lbs lost to date: 31 Total lbs lost since last in-office visit: 3  Interim History:  [name] is currently on Mounjaro 10 mg-has had 1 dose at this strength- she is receiving this Rx from a remote provider from [place]. She is paying out of pocket for Mounjaro- $580/month. She is not diabetic.  Current weight is 150 pounds/BMI 30, per patient she was told to lose down to 100 pounds/BMI 20.8.   Recommended weight 125-135  pounds/BMI 23- BMI 25 Last time she weighed 100 pounds was > 23 years ago.  Subjective:   1. Vitamin D deficiency 02/22/22 Vit D Level-55.0-stable. She is on OTC Vit D supplement Vit D3- 2 x 1,000 IU daily.  Assessment/Plan:   1. Vitamin D deficiency [name] will continue her daily OTC vitamin D supplement.  2. Obesity with current BMI of 30.1 Shontay is currently in the action stage of change. As such, her goal is to continue with weight loss efforts. She has agreed to keeping a food journal and adhering to recommended goals of 1000 calories and 100 grams of protein daily.   We will recheck IC at her next office visit.  Exercise goals: As is.  Behavioral modification strategies: increasing lean protein intake, decreasing simple carbohydrates, meal planning and cooking strategies, keeping healthy foods in the home, and planning for success.  [name] has agreed to follow-up with our clinic in 2 weeks. She was informed  of the importance of frequent follow-up visits to maximize her success with intensive lifestyle modifications for her multiple health conditions.   Objective:   Blood pressure 108/74, pulse 75, temperature 98 F (36.7 C), height '5\' 1"'$  (1.549 m), weight 159 lb (72.1 kg), SpO2 98 %. Body mass index is 30.04 kg/m.  General: Cooperative, alert, well developed, in no acute distress. HEENT: Conjunctivae and lids unremarkable. Cardiovascular: Regular rhythm.  Lungs: Normal work of breathing. Neurologic: No focal deficits.   Lab Results  Component Value Date   CREATININE 0.99 02/22/2022   BUN 17 02/22/2022   NA 144 02/22/2022   K 4.9 02/22/2022   CL 103 02/22/2022   CO2 23 02/22/2022   Lab Results  Component Value Date   ALT 60 (H) 02/22/2022   AST 30 02/22/2022   ALKPHOS 105 02/22/2022   BILITOT 0.4 02/22/2022   Lab Results  Component Value Date   HGBA1C 5.7 (H) 02/22/2022   HGBA1C 6.3 09/26/2021   No results found for: "INSULIN" Lab Results  Component Value Date   TSH 2.07 03/06/2022   Lab Results  Component Value Date   CHOL 203 (H) 09/26/2021   HDL 47.20 09/26/2021   LDLCALC 129 (H) 09/26/2021   TRIG 135.0 09/26/2021   CHOLHDL 4 09/26/2021   Lab Results  Component Value Date   VD25OH 55.0 02/22/2022   VD25OH 30.3 11/29/2021   Lab Results  Component Value Date   WBC 6.9 11/16/2021   HGB 13.5 11/16/2021  HCT 43.2 11/16/2021   MCV 81.4 11/16/2021   PLT 327.0 11/16/2021   No results found for: "IRON", "TIBC", "FERRITIN"  Attestation Statements:   Reviewed by clinician on day of visit: allergies, medications, problem list, medical history, surgical history, family history, social history, and previous encounter notes.  Time spent on visit including pre-visit chart review and post-visit care and charting was 27 minutes.   [name], am acting as transcriptionist for Mina Marble, NP.  I have reviewed the above documentation for accuracy and  completeness, and I agree with the above. -  Mateusz Neilan d. Jyssica Rief, NP-C

## 2022-04-25 ENCOUNTER — Encounter: Payer: Self-pay | Admitting: Family Medicine

## 2022-04-25 ENCOUNTER — Ambulatory Visit (INDEPENDENT_AMBULATORY_CARE_PROVIDER_SITE_OTHER): Payer: 59 | Admitting: Family Medicine

## 2022-04-25 VITALS — BP 126/78 | HR 83 | Temp 98.2°F | Resp 16 | Ht 61.0 in | Wt 160.2 lb

## 2022-04-25 DIAGNOSIS — L237 Allergic contact dermatitis due to plants, except food: Secondary | ICD-10-CM | POA: Diagnosis not present

## 2022-04-25 MED ORDER — PREDNISONE 20 MG PO TABS: ORAL_TABLET | ORAL | 0 refills | Status: DC

## 2022-04-25 NOTE — Patient Instructions (Signed)
Start prednisone, I suspect that will help the itching tonight or tomorrow.  Continued use of calamine and tecnu is fine. Hope you feel better soon!  Return to the clinic or go to the nearest emergency room if any of your symptoms worsen or new symptoms occur.  Poison Ivy Dermatitis Poison ivy dermatitis is inflammation of the skin that is caused by chemicals in the leaves of the poison ivy plant. The skin reaction often involves redness, swelling, blisters, and extreme itching. What are the causes? This condition is caused by a chemical (urushiol) found in the sap of the poison ivy plant. This chemical is sticky and can be easily spread to people, animals, and objects. You can get poison ivy dermatitis by: Having direct contact with a poison ivy plant. Touching animals, other people, or objects that have come in contact with poison ivy and have the chemical on them. What increases the risk? This condition is more likely to develop in people who: Are outdoors often in wooded or [place] areas. Go outdoors without wearing protective clothing, such as closed shoes, long pants, and a long-sleeved shirt. What are the signs or symptoms? Symptoms of this condition include: Redness of the skin. Extreme itching. A rash that often includes bumps and blisters. The rash usually appears 48 hours after exposure, if you have been exposed before. If this is the first time you have been exposed, the rash may not appear until a week after exposure. Swelling. This may occur if the reaction is more severe. Symptoms usually last for 1-2 weeks. However, the first time you develop this condition, symptoms may last 3-4 weeks. How is this diagnosed? This condition may be diagnosed based on your symptoms and a physical exam. Your health care provider may also ask you about any recent outdoor activity. How is this treated? Treatment for this condition will vary depending on how severe it is. Treatment may  include: Hydrocortisone cream or calamine lotion to relieve itching. Oatmeal baths to soothe the skin. Medicines, such as over-the-counter antihistamine tablets. Oral steroid medicine, for more severe reactions. Follow these instructions at home: Medicines Take or apply over-the-counter and prescription medicines only as told by your health care provider. Use hydrocortisone cream or calamine lotion as needed to soothe the skin and relieve itching. General instructions Do not scratch or rub your skin. Apply a cold, wet cloth (cold compress) to the affected areas or take baths in cool water. This will help with itching. Avoid hot baths and showers. Take oatmeal baths as needed. Use colloidal oatmeal. You can get this at your local pharmacy or grocery store. Follow the instructions on the packaging. While you have the rash, wash clothes right after you wear them. Keep all follow-up visits as told by your health care provider. This is important. How is this prevented?  Learn to identify the poison ivy plant and avoid contact with the plant. This plant can be recognized by the number of leaves. Generally, poison ivy has three leaves with flowering branches on a single stem. The leaves are typically glossy, and they have jagged edges that come to a point at the front. If you have been exposed to poison ivy, thoroughly wash with soap and water right away. You have about 30 minutes to remove the plant resin before it will cause the rash. Be sure to wash under your fingernails, because any plant resin there will continue to spread the rash. When hiking or camping, wear clothes that will help you to  avoid exposure on the skin. This includes long pants, a long-sleeved shirt, tall socks, and hiking boots. You can also apply preventive lotion to your skin to help limit exposure. If you suspect that your clothes or outdoor gear came in contact with poison ivy, rinse them off outside with a garden hose before  you bring them inside your house. When doing yard work or gardening, wear gloves, long sleeves, long pants, and boots. Wash your garden tools and gloves if they come in contact with poison ivy. If you suspect that your pet has come into contact with poison ivy, wash him or her with pet shampoo and water. Make sure to wear gloves while washing your pet. Contact a health care provider if you have: Open sores in the rash area. More redness, swelling, or pain in the affected area. Redness that spreads beyond the rash area. Fluid, blood, or pus coming from the affected area. A fever. A rash over a large area of your body. A rash on your eyes, mouth, or genitals. A rash that does not improve after a few weeks. Get help right away if: Your face swells or your eyes swell shut. You have trouble breathing. You have trouble swallowing. These symptoms may represent a serious problem that is an emergency. Do not wait to see if the symptoms will go away. Get medical help right away. Call your local emergency services (911 in the [place] Do not drive yourself to the hospital. Summary Poison ivy dermatitis is inflammation of the skin that is caused by chemicals in the leaves of the poison ivy plant. Symptoms of this condition include redness, itching, a rash, and swelling. Do not scratch or rub your skin. Take or apply over-the-counter and prescription medicines only as told by your health care provider. This information is not intended to replace advice given to you by your health care provider. Make sure you discuss any questions you have with your health care provider. Document Revised: 08/07/2021 Document Reviewed: 08/07/2021 Elsevier Patient Education  [place].

## 2022-04-25 NOTE — Progress Notes (Signed)
Subjective:  Patient ID: [name], female    DOB: 23-Jun-1971  Age: 51 y.o. MRN: [id number]  CC:  Chief Complaint  Patient presents with   Poison Oak    Pt reports started on ankle apx 4 days ago notes it has gone up her legs, on both arms and is now on her face.     HPI [name] presents for  [employer], started 4 days ago on R leg - itchy blistery rash.  Similar sx's in past. 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  Rash spread to both legs, arms, now spread to face past 2 days. No genital involvement. No vision changes.  Tx: calamine lotion, Tecnu shower treatment for poison ivy, benadryl spray.  Hx of CKD.  Weight has improved since February with intentional wt loss.    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/2017 05/18/2017   Atrophic kidney s/p right nepherctomy 05/17/2017 05/18/2017   GERD (gastroesophageal reflux disease)    Complication of anesthesia    Arthritis    Gastric tumor s/p partial gastrectomy 05/17/2016 05/17/2017   Solitary pulmonary nodule 02/18/2017   Genetic testing 04/04/2016   Family history of colon cancer 03/14/2016   Family history of breast cancer in female 03/14/2016   Class 1 obesity with serious comorbidity and body mass index (BMI) of 30.0 to 30.9 in adult 01/18/2012   Anxiety state 04/20/2008   Cough variant asthma 04/20/2008   LIVER FUNCTION TESTS, ABNORMAL 04/20/2008   Attention deficit hyperactivity  disorder (ADHD) 06/10/2007   Past Medical History:  Diagnosis Date   Arthritis    Asthma    Complication of anesthesia    respirations slow down after coming out from Anesthesia   Endometriosis    GERD (gastroesophageal reflux disease)    H/O right nephrectomy    Mass of right kidney    Other fatigue    Palpitations    Post-operative nausea and vomiting    Shortness of breath on exertion    Past Surgical History:  Procedure Laterality Date   ABDOMINAL HYSTERECTOMY     CERVICAL DISC SURGERY     CESAREAN SECTION     CHOLECYSTECTOMY     COLONOSCOPY  12/17/2017   Dr.Stark   EUS N/A 03/14/2017   Procedure: UPPER ENDOSCOPIC ULTRASOUND (EUS) LINEAR;  Surgeon: Milus Banister, MD;  Location: [facility];  Service: Endoscopy;  Laterality: N/A;   I & D EXTREMITY Left 05/26/2021   Procedure: IRRIGATION AND DEBRIDEMENT EXTREMITY WITH COMPLEX CLOSURE OF WOUND;  Surgeon: Meredith Pel, MD;  Location: [facility];  Service: Orthopedics;  Laterality: Left;   KNEE ARTHROSCOPY Left 05/26/2021   Procedure: ARTHROSCOPIC KNEE WASHOUT;  Surgeon: Meredith Pel, MD;  Location: [facility];  Service: Orthopedics;  Laterality: Left;   LAPAROSCOPIC ABDOMINAL EXPLORATION N/A 05/17/2017   Procedure: LAPAROSCOPIC POSSIBLE OPEN REMOVAL OF STOMACH MASS;  Surgeon: Jackolyn Confer, MD;  Location: [facility];  Service: General;  Laterality: N/A;   LAPAROSCOPIC NEPHRECTOMY Right 05/17/2017   Procedure: RIGHT RADICAL NEPHRECTOMY, URETERECTOMY;  Surgeon: Ardis Hughs, MD;  Location: [facility];  Service: Urology;  Laterality: Right;   tubal preganancy     Allergies  Allergen Reactions   Amoxicillin Anaphylaxis    All "cillins" pt was 10-38 years old  Has patient had a PCN reaction causing immediate rash, facial/tongue/throat swelling, SOB or lightheadedness with hypotension: yes Has patient had a PCN reaction causing severe rash involving mucus membranes or skin necrosis: unknown Has patient had a PCN reaction  that required hospitalization yes Has patient had a PCN reaction occurring within the last 10 years:no If all of the above answers are "NO", then may proceed with Cephalosporin use.    Ibuprofen Anaphylaxis and Hives   Keflex [Cephalexin] Anaphylaxis   Penicillins Anaphylaxis    Has patient had a PCN reaction causing immediate rash, facial/tongue/throat swelling, SOB or lightheadedness with hypotension:yes Has patient had a PCN reaction causing severe rash involving mucus membranes or skin necrosis: unknown Has patient had a PCN reaction that required hospitalization yes  Has patient had a PCN reaction occurring within the last 10 years: no If all of the above answers are "NO", then may proceed with Cephalosporin use.    Baby Powder [Methylbenzethonium] Hives   Doxycycline Diarrhea and Rash   Prior to Admission medications   Medication Sig Start Date End Date Taking? Authorizing Provider  albuterol (PROVENTIL HFA) 108 (90 Base) MCG/ACT inhaler Inhale 2 puffs into the lungs every 6 (six) hours as needed for wheezing or shortness of breath. 07/20/21  Yes Wendie Agreste, MD  CEQUA 0.09 % SOLN Apply 1 drop to eye 2 (two) times daily. 12/29/21  Yes [provider]  Cholecalciferol (VITAMIN D3) 25 MCG (1000 UT) CAPS Take 2 capsules (2,000 Units total) by mouth daily. 12/18/21  Yes Wendie Agreste, MD  furosemide (LASIX) 20 MG tablet Take 0.5 tablets (10 mg total) by mouth daily. 03/01/22  Yes Wendie Agreste, MD  methimazole (TAPAZOLE) 5 MG tablet Take 0.5 tablets (2.5 mg total) by mouth as directed. Half a tablet 4 days a week 03/06/22  Yes Shamleffer, Melanie Crazier, MD  MOUNJARO 7.5 MG/0.5ML Pen SMARTSIG:7.5 Milligram(s) SUB-Q Once a Week 03/06/22  Yes [provider]  NON FORMULARY [place]   Yes [provider]  ondansetron (ZOFRAN) 4 MG tablet Take 1 tablet (4 mg total) by mouth every 8 (eight) hours as needed for nausea or vomiting. 01/01/22  Yes Danford, Valetta Fuller D,  NP  pantoprazole (PROTONIX) 40 MG tablet Take 1 tablet (40 mg total) by mouth 2 (two) times daily. 10/20/21  Yes Ladene Artist, MD  VALIUM 10 MG tablet 10 mg at bedtime. 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    Comment: Used to work as Quarry manager at [employer]  Tobacco Use   Smoking status: Never   Smokeless tobacco: Never  Vaping Use   Vaping Use: Never used  Substance and Sexual Activity   Alcohol use: No   Drug use: No   Sexual activity: Yes    Birth control/protection: Surgical  Other Topics Concern   Not  on file  Social History Narrative   Not on file   Social Determinants of Health   Financial Resource Strain: Not on file  Food Insecurity: Not on file  Transportation Needs: Not on file  Physical Activity: Not on file  Stress: Not on file  Social Connections: Not on file  Intimate Partner Violence: Not on file    Review of Systems Per HPI.   Objective:   Vitals:   04/25/22 0912  BP: 126/78  Pulse: 83  Resp: 16  Temp: 98.2 F (36.8 C)  TempSrc: Temporal  SpO2: 97%  Weight: 160 lb 3.2 oz (72.7 kg)  Height: '5\' 1"'$  (1.549 m)     Physical Exam Constitutional:      General: She is not in acute distress.    Appearance: Normal appearance. She is well-developed.  HENT:     Head: Normocephalic and atraumatic.  Cardiovascular:     Rate and Rhythm: Normal rate.  Pulmonary:     Effort: Pulmonary effort is normal.  Skin:    Comments: Scattered faint erythematous papules throughout lower legs, posterior thighs bilaterally, few on ankle on right, few patches on abdomen, posterior neck, behind right ear, left malar area, right forehead.  No periorbital rash.  Neurological:     Mental  Status: She is alert and oriented to person, place, and time.  Psychiatric:        Mood and Affect: Mood normal.        Assessment & Plan:  [name] is a 51 y.o. female . Poison ivy dermatitis  -Diffuse, now with face involvement.  Start prednisone, potential side effects and risk discussed.  Okay to continue topical treatments.  RTC precautions, handout given.  Meds ordered this encounter  Medications   predniSONE (DELTASONE) 20 MG tablet    Sig: 3 by mouth for 3 days, then 2 by mouth for 2 days, then 1 by mouth for 2 days, then 1/2 by mouth for 2 days.    Dispense:  16 tablet    Refill:  0   Patient Instructions  Start prednisone, I suspect that will help the itching tonight or tomorrow.  Continued use of calamine and tecnu is fine. Hope you feel better soon!  Return to the clinic or go to the nearest emergency room if any of your symptoms worsen or new symptoms occur.  Poison Ivy Dermatitis Poison ivy dermatitis is inflammation of the skin that is caused by chemicals in the leaves of the poison ivy plant. The skin reaction often involves redness, swelling, blisters, and extreme itching. What are the causes? This condition is caused by a chemical (urushiol) found in the sap of the poison ivy plant. This chemical is sticky and can be easily spread to people, animals, and objects. You can get poison ivy dermatitis by: Having direct contact with a poison ivy plant. Touching animals, other people, or objects that have come in contact with poison ivy and have the chemical on them. What increases the risk? This condition is more likely to develop in people who: Are outdoors often in wooded or [place] areas. Go outdoors without wearing protective clothing, such as closed shoes, long pants, and a long-sleeved shirt. What are the signs or symptoms? Symptoms of this condition include: Redness of the skin. Extreme itching. A rash that often includes bumps and blisters.  The rash usually appears 48 hours after exposure, if you have been exposed before. If this is the first time you have been exposed,  the rash may not appear until a week after exposure. Swelling. This may occur if the reaction is more severe. Symptoms usually last for 1-2 weeks. However, the first time you develop this condition, symptoms may last 3-4 weeks. How is this diagnosed? This condition may be diagnosed based on your symptoms and a physical exam. Your health care provider may also ask you about any recent outdoor activity. How is this treated? Treatment for this condition will vary depending on how severe it is. Treatment may include: Hydrocortisone cream or calamine lotion to relieve itching. Oatmeal baths to soothe the skin. Medicines, such as over-the-counter antihistamine tablets. Oral steroid medicine, for more severe reactions. Follow these instructions at home: Medicines Take or apply over-the-counter and prescription medicines only as told by your health care provider. Use hydrocortisone cream or calamine lotion as needed to soothe the skin and relieve itching. General instructions Do not scratch or rub your skin. Apply a cold, wet cloth (cold compress) to the affected areas or take baths in cool water. This will help with itching. Avoid hot baths and showers. Take oatmeal baths as needed. Use colloidal oatmeal. You can get this at your local pharmacy or grocery store. Follow the instructions on the packaging. While you have the rash, wash clothes right after you wear them. Keep all follow-up visits as told by your health care provider. This is important. How is this prevented?  Learn to identify the poison ivy plant and avoid contact with the plant. This plant can be recognized by the number of leaves. Generally, poison ivy has three leaves with flowering branches on a single stem. The leaves are typically glossy, and they have jagged edges that come to a point at the  front. If you have been exposed to poison ivy, thoroughly wash with soap and water right away. You have about 30 minutes to remove the plant resin before it will cause the rash. Be sure to wash under your fingernails, because any plant resin there will continue to spread the rash. When hiking or camping, wear clothes that will help you to avoid exposure on the skin. This includes long pants, a long-sleeved shirt, tall socks, and hiking boots. You can also apply preventive lotion to your skin to help limit exposure. If you suspect that your clothes or outdoor gear came in contact with poison ivy, rinse them off outside with a garden hose before you bring them inside your house. When doing yard work or gardening, wear gloves, long sleeves, long pants, and boots. Wash your garden tools and gloves if they come in contact with poison ivy. If you suspect that your pet has come into contact with poison ivy, wash him or her with pet shampoo and water. Make sure to wear gloves while washing your pet. Contact a health care provider if you have: Open sores in the rash area. More redness, swelling, or pain in the affected area. Redness that spreads beyond the rash area. Fluid, blood, or pus coming from the affected area. A fever. A rash over a large area of your body. A rash on your eyes, mouth, or genitals. A rash that does not improve after a few weeks. Get help right away if: Your face swells or your eyes swell shut. You have trouble breathing. You have trouble swallowing. These symptoms may represent a serious problem that is an emergency. Do not wait to see if the symptoms will go away. Get medical help right away. Call your local emergency services (911  in the [place] Do not drive yourself to the hospital. Summary Poison ivy dermatitis is inflammation of the skin that is caused by chemicals in the leaves of the poison ivy plant. Symptoms of this condition include redness, itching, a rash, and  swelling. Do not scratch or rub your skin. Take or apply over-the-counter and prescription medicines only as told by your health care provider. This information is not intended to replace advice given to you by your health care provider. Make sure you discuss any questions you have with your health care provider. Document Revised: 08/07/2021 Document Reviewed: 08/07/2021 Elsevier Patient Education  Archie,   Merri Ray, MD [place] 04/25/22 9:54 AM

## 2022-05-10 ENCOUNTER — Telehealth: Payer: Self-pay | Admitting: Family Medicine

## 2022-05-10 ENCOUNTER — Encounter (INDEPENDENT_AMBULATORY_CARE_PROVIDER_SITE_OTHER): Payer: Self-pay | Admitting: Family Medicine

## 2022-05-10 ENCOUNTER — Ambulatory Visit (INDEPENDENT_AMBULATORY_CARE_PROVIDER_SITE_OTHER): Payer: 59 | Admitting: Family Medicine

## 2022-05-10 VITALS — BP 100/61 | HR 66 | Temp 97.4°F | Ht 61.0 in | Wt 152.0 lb

## 2022-05-10 DIAGNOSIS — E559 Vitamin D deficiency, unspecified: Secondary | ICD-10-CM

## 2022-05-10 DIAGNOSIS — E669 Obesity, unspecified: Secondary | ICD-10-CM | POA: Diagnosis not present

## 2022-05-10 DIAGNOSIS — Z6828 Body mass index (BMI) 28.0-28.9, adult: Secondary | ICD-10-CM | POA: Diagnosis not present

## 2022-05-10 DIAGNOSIS — R632 Polyphagia: Secondary | ICD-10-CM

## 2022-05-10 NOTE — Telephone Encounter (Signed)
Encourage patient to contact the pharmacy for refills or they can request refills through [facility]  (Please schedule appointment if patient has not been seen in over a year)    WHAT PHARMACY WOULD THEY LIKE THIS SENT TO: [facility] [address] [phone]  MEDICATION NAME & DOSE: Prednisone 20 mg  NOTES/COMMENTS FROM PATIENT: Pt needs more for poison oak.  Pt wants to be called after [facility].      Front office please notify patient: It takes 48-72 hours to process rx refill requests Ask patient to call pharmacy to ensure rx is ready before heading there.

## 2022-05-11 ENCOUNTER — Telehealth: Payer: Self-pay | Admitting: Family Medicine

## 2022-05-11 ENCOUNTER — Other Ambulatory Visit: Payer: Self-pay | Admitting: Family Medicine

## 2022-05-11 DIAGNOSIS — L237 Allergic contact dermatitis due to plants, except food: Secondary | ICD-10-CM

## 2022-05-11 MED ORDER — PREDNISONE 20 MG PO TABS: ORAL_TABLET | ORAL | 0 refills | Status: DC

## 2022-05-11 NOTE — Telephone Encounter (Signed)
Pt called requesting a refill for her prednisone 20 mg to be sent [facility] on [address]. Pt has a appt for a med check and labs no 08/27/22.

## 2022-05-11 NOTE — Telephone Encounter (Signed)
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   [facility] [place] [facility] per pt when I called back to discuss reason for refill

## 2022-05-11 NOTE — Progress Notes (Unsigned)
Chief Complaint:   OBESITY [name] is here to discuss her progress with her obesity treatment plan along with follow-up of her obesity related diagnoses. [name] is on keeping a food journal and adhering to recommended goals of 1000 calories and 100 protein and states she is following her eating plan approximately 100% of the time. [name] states she is doing cardio and strength training 2 hours 6 times per week.  Today's visit was #: 12 Starting weight: 190 lbs Starting date: 11/29/2021 Today's weight: 152 lbs Today's date: 05/10/2022 Total lbs lost to date: 38 lbs Total lbs lost since last in-office visit: 7 lbs  Interim History: [name] is down 38 lbs since starting program.  She was told by [name] to repeat IC today, but she did not come in 30 minutes prior and did not have a note in the appointment saying so either.  [name] is losing weight and doing great.  Last IC done 6 months ago.  Subjective:   1. Vitamin D deficiency Low Vitamin D level contributes to fatigue and are associated with obesity, breast, and colon cancer. She agrees to continue to take prescription Vitamin D '@50'$ ,000 IU every week and will follow-up for routine testing of Vitamin D, at least 2-3 times per year to avoid over-replacement. Vitamin D at 55.0 on 02/22/2022. She is taking 2,000 IU daily.   2. Polyphagia Symptoms are well controlled on [name] per her doctor in [place].  Her doctor will be increasing dose to [name] 12.5 mg next Friday. She denies any side effects and is tolerating well.   Assessment/Plan:  No orders of the defined types were placed in this encounter.   There are no discontinued medications.   No orders of the defined types were placed in this encounter.    1. Vitamin D deficiency Low Vitamin D level contributes to fatigue and are associated with obesity, breast, and colon cancer. She agrees to continue to take prescription Vitamin D '@50'$ ,000 IU every week and will follow-up for routine  testing of Vitamin D, at least 2-3 times per year to avoid over-replacement. Continue OTC Vitamin D and recheck in 3 months of consistent use or so.  Also, will recheck about the end of summer to prevent over supplementation.   2. Polyphagia Continue [name] per [place] doctor.  Continue prudent nutritional plan, weight loss and increase water intake.  Remember to journal daily.   3. Obesity with current BMI of 28.7 [name] desires to weigh 110 lbs even though I explained that seems too low.  [name] is currently in the action stage of change. As such, her goal is to continue with weight loss efforts. She has agreed to keeping a food journal and adhering to recommended goals of 1000 calories and 100 protein.   Exercise goals:  As is.   Behavioral modification strategies: increasing water intake, increasing high fiber foods, and planning for success.  [name] has agreed to follow-up with our clinic in 2 weeks. She was informed of the importance of frequent follow-up visits to maximize her success with intensive lifestyle modifications for her multiple health conditions.   Objective:   Blood pressure 100/61, pulse 66, temperature (!) 97.4 F (36.3 C), height '5\' 1"'$  (1.549 m), weight 152 lb (68.9 kg), SpO2 99 %. Body mass index is 28.72 kg/m.  General: Cooperative, alert, well developed, in no acute distress. HEENT: Conjunctivae and lids unremarkable. Cardiovascular: Regular rhythm.  Lungs: Normal work of breathing. Neurologic: No focal deficits.   Lab  Results  Component Value Date   CREATININE 0.99 02/22/2022   BUN 17 02/22/2022   NA 144 02/22/2022   K 4.9 02/22/2022   CL 103 02/22/2022   CO2 23 02/22/2022   Lab Results  Component Value Date   ALT 60 (H) 02/22/2022   AST 30 02/22/2022   ALKPHOS 105 02/22/2022   BILITOT 0.4 02/22/2022   Lab Results  Component Value Date   HGBA1C 5.7 (H) 02/22/2022   HGBA1C 6.3 09/26/2021   No results found for: "INSULIN" Lab Results   Component Value Date   TSH 2.07 03/06/2022   Lab Results  Component Value Date   CHOL 203 (H) 09/26/2021   HDL 47.20 09/26/2021   LDLCALC 129 (H) 09/26/2021   TRIG 135.0 09/26/2021   CHOLHDL 4 09/26/2021   Lab Results  Component Value Date   VD25OH 55.0 02/22/2022   VD25OH 30.3 11/29/2021   Lab Results  Component Value Date   WBC 6.9 11/16/2021   HGB 13.5 11/16/2021   HCT 43.2 11/16/2021   MCV 81.4 11/16/2021   PLT 327.0 11/16/2021   No results found for: "IRON", "TIBC", "FERRITIN"  Attestation Statements:   Reviewed by clinician on day of visit: allergies, medications, problem list, medical history, surgical history, family history, social history, and previous encounter notes.  Time spent on visit including pre-visit chart review and post-visit care and charting was 30 minutes.    I, [name], RMA, am acting as Location manager for [employer], DO.    I have reviewed the above documentation for accuracy and completeness, and I agree with the above. [name], D.O.  The [place] was signed into law in 2016 which includes the topic of electronic health records.  This provides immediate access to information in MyChart.  This includes consultation notes, operative notes, office notes, lab results and pathology reports.  If you have any questions about what you read please let us know at your next visit so we can discuss your concerns and take corrective action if need be.  We are right here with you.

## 2022-05-11 NOTE — Telephone Encounter (Signed)
Called pt - still landscaping.  Previous rash resolved, then returned about a week ago, unfortunately again after exposure to poison ivy.  Rash is the same as previous rash, no new symptoms.  Unfortunately has spread a few spots to the face.  We will start repeat prednisone taper, denies any new side effects after recent treatment.  RTC precautions if not improving or worsening.

## 2022-05-22 DIAGNOSIS — D225 Melanocytic nevi of trunk: Secondary | ICD-10-CM | POA: Diagnosis not present

## 2022-05-22 DIAGNOSIS — B360 Pityriasis versicolor: Secondary | ICD-10-CM | POA: Diagnosis not present

## 2022-05-22 DIAGNOSIS — L821 Other seborrheic keratosis: Secondary | ICD-10-CM | POA: Diagnosis not present

## 2022-05-22 DIAGNOSIS — L814 Other melanin hyperpigmentation: Secondary | ICD-10-CM | POA: Diagnosis not present

## 2022-05-24 ENCOUNTER — Encounter (INDEPENDENT_AMBULATORY_CARE_PROVIDER_SITE_OTHER): Payer: Self-pay | Admitting: Family Medicine

## 2022-05-24 ENCOUNTER — Ambulatory Visit (INDEPENDENT_AMBULATORY_CARE_PROVIDER_SITE_OTHER): Payer: 59 | Admitting: Family Medicine

## 2022-05-24 VITALS — BP 103/64 | HR 72 | Temp 97.7°F | Ht 61.0 in | Wt 149.0 lb

## 2022-05-24 DIAGNOSIS — R632 Polyphagia: Secondary | ICD-10-CM

## 2022-05-24 DIAGNOSIS — E669 Obesity, unspecified: Secondary | ICD-10-CM

## 2022-05-24 DIAGNOSIS — E559 Vitamin D deficiency, unspecified: Secondary | ICD-10-CM

## 2022-05-24 DIAGNOSIS — E7841 Elevated Lipoprotein(a): Secondary | ICD-10-CM | POA: Diagnosis not present

## 2022-05-24 DIAGNOSIS — R7303 Prediabetes: Secondary | ICD-10-CM

## 2022-05-24 DIAGNOSIS — Z6828 Body mass index (BMI) 28.0-28.9, adult: Secondary | ICD-10-CM

## 2022-05-24 DIAGNOSIS — K760 Fatty (change of) liver, not elsewhere classified: Secondary | ICD-10-CM | POA: Diagnosis not present

## 2022-05-25 LAB — HEMOGLOBIN A1C
Est. average glucose Bld gHb Est-mCnc: 114 mg/dL
Hgb A1c MFr Bld: 5.6 % (ref 4.8–5.6)

## 2022-05-25 LAB — LIPID PANEL
Chol/HDL Ratio: 3.9 ratio (ref 0.0–4.4)
Cholesterol, Total: 189 mg/dL (ref 100–199)
HDL: 49 mg/dL (ref >39)
LDL Chol Calc (NIH): 124 mg/dL — ABNORMAL HIGH (ref 0–99)
Triglycerides: 90 mg/dL (ref 0–149)
VLDL Cholesterol Cal: 16 mg/dL (ref 5–40)

## 2022-05-25 LAB — COMPREHENSIVE METABOLIC PANEL
ALT: 36 IU/L — ABNORMAL HIGH (ref 0–32)
AST: 20 IU/L (ref 0–40)
Albumin/Globulin Ratio: 2 (ref 1.2–2.2)
Albumin: 4.6 g/dL (ref 3.8–4.9)
Alkaline Phosphatase: 98 IU/L (ref 44–121)
BUN/Creatinine Ratio: 15 (ref 9–23)
BUN: 12 mg/dL (ref 6–24)
Bilirubin Total: 0.4 mg/dL (ref 0.0–1.2)
CO2: 25 mmol/L (ref 20–29)
Calcium: 10.1 mg/dL (ref 8.7–10.2)
Chloride: 102 mmol/L (ref 96–106)
Creatinine, Ser: 0.82 mg/dL (ref 0.57–1.00)
Globulin, Total: 2.3 g/dL (ref 1.5–4.5)
Glucose: 83 mg/dL (ref 70–99)
Potassium: 4.8 mmol/L (ref 3.5–5.2)
Sodium: 141 mmol/L (ref 134–144)
Total Protein: 6.9 g/dL (ref 6.0–8.5)
eGFR: 85 mL/min/{1.73_m2} (ref >59)

## 2022-05-25 LAB — VITAMIN D 25 HYDROXY (VIT D DEFICIENCY, FRACTURES): Vit D, 25-Hydroxy: 62 ng/mL (ref 30.0–100.0)

## 2022-05-25 LAB — INSULIN, RANDOM: INSULIN: 7.7 u[IU]/mL (ref 2.6–24.9)

## 2022-05-28 NOTE — Progress Notes (Signed)
Chief Complaint:   OBESITY [name] is here to discuss her progress with her obesity treatment plan along with follow-up of her obesity related diagnoses. [name] is on keeping a food journal and adhering to recommended goals of 1000 calories and 100 grams protein and states she is following her eating plan approximately 100% of the time. [name] states she is walking and exercising 2.5-3 hours 6-7 times per week.  Today's visit was #: 22 Starting weight: 190 lbs Starting date: 11/29/2021 Today's weight: 149 lbs Today's date: 05/24/2022 Total lbs lost to date: 41 Total lbs lost since last in-office visit: 3  Interim History: [name] states that she is at 80 grams of protein per day and 1000 calories regularly. Breakfast is a [employer]life shake, 2 eggs, and 1/2 cup fruit. Lunch is a Biomedical scientist salad with 4 oz chicken or steak. Dinner is chicken or just vegetables. Pt's water intake is 3 liters per day.  Subjective:   1. HLD- inc LDL 8 months ago, Rejoice's LDL was elevated at 129. Medication: None  2. NAFLD (nonalcoholic fatty liver disease) NAFLD is an umbrella term that encompasses a disease spectrum that includes steatosis (fat) without inflammation, steatohepatitis (NASH; fat + inflammation in a characteristic pattern), and cirrhosis. Bland steatosis is felt to be a benign condition, with extremely low to no risk of progression to cirrhosis, whereas NASH can progress to cirrhosis.   3. Polyphagia Pt is on Mounjaro 12.5 mg for 2 weeks now and tolerating it well with no side effects.  4. Vitamin D deficiency She is currently taking OTC vitamin D 2,000 IU each day. She denies nausea, vomiting or muscle weakness.  5. Pre-diabetes [name] has a diagnosis of prediabetes based on her elevated HgA1c and was informed this puts her at greater risk of developing diabetes. She continues to work on diet and exercise to decrease her risk of diabetes. She denies nausea or hypoglycemia.  Assessment/Plan:   Orders  Placed This Encounter  Procedures   Hemoglobin A1c   Insulin, random   VITAMIN D 25 Hydroxy (Vit-D Deficiency, Fractures)   Comprehensive metabolic panel   Lipid panel    There are no discontinued medications.   No orders of the defined types were placed in this encounter.    1. HLD- inc LDL Cardiovascular risk and specific lipid/LDL goals reviewed.  We discussed several lifestyle modifications today and Kayleana will continue to work on diet, exercise and weight loss efforts. Orders and follow up as documented in patient record.   Counseling Intensive lifestyle modifications are the first line treatment for this issue. Dietary changes: Increase soluble fiber. Decrease simple carbohydrates. Exercise changes: Moderate to vigorous-intensity aerobic activity 150 minutes per week if tolerated. Lipid-lowering medications: see documented in medical record. Obtain fasting labs today.  - Lipid panel  2. NAFLD (nonalcoholic fatty liver disease) The mainstay of treatment of NAFLD includes lifestyle modification to achieve weight loss, at least 7% of current body weight. Low carbohydrate diets can be beneficial in improving NAFLD liver histology. Additionally, exercise, even the absence of weight loss can have beneficial effects on the patient's metabolic profile and liver health. Obtain fasting labs today.  - Comprehensive metabolic panel  3. Polyphagia Intensive lifestyle modifications are the first line treatment for this issue. We discussed several lifestyle modifications today and she will continue to work on diet, exercise and weight loss efforts. Orders and follow up as documented in patient record.  Counseling Polyphagia is excessive hunger. Causes can include: low  blood sugars, hypERthyroidism, PMS, lack of sleep, stress, insulin resistance, diabetes, certain medications, and diets that are deficient in protein and fiber.   4. Vitamin D deficiency Low Vitamin D level contributes to  fatigue and are associated with obesity, breast, and colon cancer. She agrees to continue OTC Vitamin D 2,000 IU daily and will follow-up for routine testing of Vitamin D, at least 2-3 times per year to avoid over-replacement. Obtain fasting labs today.  - VITAMIN D 25 Hydroxy (Vit-D Deficiency, Fractures)  5. Pre-diabetes [name] will continue to work on weight loss, exercise, and decreasing simple carbohydrates to help decrease the risk of diabetes. Obtain fasting labs today.  - Hemoglobin A1c - Insulin, random  6. Obesity with current BMI of 28.2 [name] is currently in the action stage of change. As such, her goal is to continue with weight loss efforts. She has agreed to keeping a food journal and adhering to recommended goals of 1000 calories and 100 grams protein.   Exercise goals:  As is  Behavioral modification strategies: increasing lean protein intake, decreasing simple carbohydrates, and planning for success.  [name] has agreed to follow-up with our clinic in 2 weeks, per pt preference. She was informed of the importance of frequent follow-up visits to maximize her success with intensive lifestyle modifications for her multiple health conditions.   [name] was informed we would discuss her lab results at her next visit unless there is a critical issue that needs to be addressed sooner. [name] agreed to keep her next visit at the agreed upon time to discuss these results.  Objective:   Blood pressure 103/64, pulse 72, temperature 97.7 F (36.5 C), height '5\' 1"'$  (1.549 m), weight 149 lb (67.6 kg), SpO2 99 %. Body mass index is 28.15 kg/m.  General: Cooperative, alert, well developed, in no acute distress. HEENT: Conjunctivae and lids unremarkable. Cardiovascular: Regular rhythm.  Lungs: Normal work of breathing. Neurologic: No focal deficits.   Lab Results  Component Value Date   CREATININE 0.82 05/24/2022   BUN 12 05/24/2022   NA 141 05/24/2022   K 4.8 05/24/2022   CL 102  05/24/2022   CO2 25 05/24/2022   Lab Results  Component Value Date   ALT 36 (H) 05/24/2022   AST 20 05/24/2022   ALKPHOS 98 05/24/2022   BILITOT 0.4 05/24/2022   Lab Results  Component Value Date   HGBA1C 5.6 05/24/2022   HGBA1C 5.7 (H) 02/22/2022   HGBA1C 6.3 09/26/2021   Lab Results  Component Value Date   INSULIN 7.7 05/24/2022   Lab Results  Component Value Date   TSH 2.07 03/06/2022   Lab Results  Component Value Date   CHOL 189 05/24/2022   HDL 49 05/24/2022   LDLCALC 124 (H) 05/24/2022   TRIG 90 05/24/2022   CHOLHDL 3.9 05/24/2022   Lab Results  Component Value Date   VD25OH 62.0 05/24/2022   VD25OH 55.0 02/22/2022   VD25OH 30.3 11/29/2021   Lab Results  Component Value Date   WBC 6.9 11/16/2021   HGB 13.5 11/16/2021   HCT 43.2 11/16/2021   MCV 81.4 11/16/2021   PLT 327.0 11/16/2021    Attestation Statements:   Reviewed by clinician on day of visit: allergies, medications, problem list, medical history, surgical history, family history, social history, and previous encounter notes.  I, Kathlene November, BS, CMA, am acting as transcriptionist for [employer], DO.   I have reviewed the above documentation for accuracy and completeness, and I agree with  the above. [name], D.O.  The [place] was signed into law in 2016 which includes the topic of electronic health records.  This provides immediate access to information in MyChart.  This includes consultation notes, operative notes, office notes, lab results and pathology reports.  If you have any questions about what you read please let us know at your next visit so we can discuss your concerns and take corrective action if need be.  We are right here with you.

## 2022-06-01 ENCOUNTER — Telehealth: Payer: Self-pay | Admitting: Family Medicine

## 2022-06-01 NOTE — Telephone Encounter (Signed)
Encourage patient to contact the pharmacy for refills or they can request refills through [facility]  (Please schedule appointment if patient has not been seen in over a year)    [place] THIS SENT TO: [place] # [place], [place] NAME & DOSE: Albuterol inhaler   NOTES/COMMENTS FROM PATIENT: Pt need a refill on her Albuterol inhaler before Tuesday bc Pt states that she is leaving for the beach Tuesday morning.      [place] office please notify patient: It takes 48-72 hours to process rx refill requests Ask patient to call pharmacy to ensure rx is ready before heading there.

## 2022-06-05 ENCOUNTER — Other Ambulatory Visit: Payer: Self-pay

## 2022-06-05 DIAGNOSIS — J45991 Cough variant asthma: Secondary | ICD-10-CM

## 2022-06-05 MED ORDER — ALBUTEROL SULFATE HFA 108 (90 BASE) MCG/ACT IN AERS: 2.0000 | INHALATION_SPRAY | Freq: Four times a day (QID) | RESPIRATORY_TRACT | 0 refills | Status: DC | PRN

## 2022-06-05 NOTE — Telephone Encounter (Signed)
Pt called back; states that [facility]. has not received refill and pt is going out of town and needs to pick up today.   Albuterol Inhaler .09% soln  [facility] [phone]

## 2022-06-05 NOTE — Telephone Encounter (Signed)
Refill sent to pharmacy.   

## 2022-06-06 ENCOUNTER — Other Ambulatory Visit (INDEPENDENT_AMBULATORY_CARE_PROVIDER_SITE_OTHER): Payer: 59

## 2022-06-06 ENCOUNTER — Ambulatory Visit (INDEPENDENT_AMBULATORY_CARE_PROVIDER_SITE_OTHER): Payer: 59 | Admitting: Family Medicine

## 2022-06-06 DIAGNOSIS — E0590 Thyrotoxicosis, unspecified without thyrotoxic crisis or storm: Secondary | ICD-10-CM | POA: Diagnosis not present

## 2022-06-06 LAB — TSH: TSH: 1.34 u[IU]/mL (ref 0.35–5.50)

## 2022-06-06 LAB — T4, FREE: Free T4: 1.01 ng/dL (ref 0.60–1.60)

## 2022-06-07 ENCOUNTER — Telehealth: Payer: Self-pay | Admitting: Internal Medicine

## 2022-06-07 NOTE — Telephone Encounter (Signed)
Please let the pt know that her thyroid function is normal and to continue current dose of Methimazole   Thank you

## 2022-06-07 NOTE — Telephone Encounter (Signed)
Patient notified and verbalized understanding. 

## 2022-06-20 ENCOUNTER — Encounter (INDEPENDENT_AMBULATORY_CARE_PROVIDER_SITE_OTHER): Payer: Self-pay | Admitting: Family Medicine

## 2022-06-20 ENCOUNTER — Ambulatory Visit (INDEPENDENT_AMBULATORY_CARE_PROVIDER_SITE_OTHER): Payer: 59 | Admitting: Family Medicine

## 2022-06-20 VITALS — BP 110/71 | HR 86 | Temp 97.8°F | Ht 61.0 in | Wt 144.0 lb

## 2022-06-20 DIAGNOSIS — Z6827 Body mass index (BMI) 27.0-27.9, adult: Secondary | ICD-10-CM

## 2022-06-20 DIAGNOSIS — E7849 Other hyperlipidemia: Secondary | ICD-10-CM | POA: Diagnosis not present

## 2022-06-20 DIAGNOSIS — E669 Obesity, unspecified: Secondary | ICD-10-CM | POA: Diagnosis not present

## 2022-06-20 DIAGNOSIS — E559 Vitamin D deficiency, unspecified: Secondary | ICD-10-CM

## 2022-06-20 DIAGNOSIS — R7303 Prediabetes: Secondary | ICD-10-CM | POA: Diagnosis not present

## 2022-06-20 NOTE — Patient Instructions (Signed)
The 10-year ASCVD risk score (Arnett DK, et al., 2019) is: 2.5%   Values used to calculate the score:     Age: 51 years     Sex: Female     Is Non-Hispanic African American: No     Diabetic: Yes     Tobacco smoker: No     Systolic Blood Pressure: 830 mmHg     Is BP treated: No     HDL Cholesterol: 49 mg/dL     Total Cholesterol: 189 mg/dL

## 2022-06-25 DIAGNOSIS — N809 Endometriosis, unspecified: Secondary | ICD-10-CM | POA: Diagnosis not present

## 2022-06-25 DIAGNOSIS — R102 Pelvic and perineal pain: Secondary | ICD-10-CM | POA: Diagnosis not present

## 2022-06-26 NOTE — Progress Notes (Signed)
Chief Complaint:   OBESITY [name] is here to discuss her progress with her obesity treatment plan along with follow-up of her obesity related diagnoses. [name] is on keeping a food journal and adhering to recommended goals of 1000 calories and 100 grams protein and states she is following her eating plan approximately 100% of the time. [name] states she is walking and weights 60 minutes 4-6 times per week.  Today's visit was #: 14 Starting weight: 190 lbs Starting date: 11/29/2021 Today's weight: 144 lbs Today's date: 06/20/2022 Total lbs lost to date: 46 Total lbs lost since last in-office visit: 5  Interim History: Pt was at the beach for 2.5 weeks in the past month. She walked over 17 miles almost every day and denies hunger and cravings. Pt has been following meal plan 90+% of the time.   Subjective:   1. Other hyperlipidemia Discussed labs with patient today. [name] has hyperlipidemia and has been trying to improve her cholesterol levels with intensive lifestyle modifications including a low saturated fat diet, exercise, and weight loss. She denies any chest pain, claudication, or myalgias. Medication: None  2. Vitamin D deficiency Discussed labs with patient today. She is currently taking OTC vitamin D 2000 IU each day. She denies nausea, vomiting or muscle weakness.  3. Pre-diabetes Discussed labs with patient today. [name] has a diagnosis of prediabetes based on her elevated HgA1c and was informed this puts her at greater risk of developing diabetes. She continues to work on diet and exercise to decrease her risk of diabetes. She denies nausea or hypoglycemia. Medication: Mounjaro  Assessment/Plan:  No orders of the defined types were placed in this encounter.   There are no discontinued medications.   No orders of the defined types were placed in this encounter.    1. Other hyperlipidemia Improving LDL from prior and slightly improved HDL. Cardiovascular risk and specific  lipid/LDL goals reviewed.  We discussed several lifestyle modifications today and [name] will continue to work on diet, exercise and weight loss efforts. Orders and follow up as documented in patient record. Continue prudent nutritional plan.  Counseling Intensive lifestyle modifications are the first line treatment for this issue. Dietary changes: Increase soluble fiber. Decrease simple carbohydrates. Exercise changes: Moderate to vigorous-intensity aerobic activity 150 minutes per week if tolerated. Lipid-lowering medications: see documented in medical record.  2. Vitamin D deficiency Low Vitamin D level contributes to fatigue and are associated with obesity, breast, and colon cancer. She agrees to continue OTC Vitamin D 2,000 IU daily and will follow-up for routine testing of Vitamin D, at least 2-3 times per year to avoid over-replacement.  3. Pre-diabetes A1c within normal limits. It started at 6.3 with [place]. [name] will continue to work on weight loss, exercise, and decreasing simple carbohydrates to help decrease the risk of diabetes.   4. Obesity with current BMI of 27.2 [name] is currently in the action stage of change. As such, her goal is to continue with weight loss efforts. She has agreed to keeping a food journal and adhering to recommended goals of 1000 calories and 100 grams protein.   Exercise goals:  As is  Behavioral modification strategies: increasing lean protein intake and decreasing simple carbohydrates.  [name] has agreed to follow-up with our clinic in 2 weeks. She was informed of the importance of frequent follow-up visits to maximize her success with intensive lifestyle modifications for her multiple health conditions.   Objective:   Blood pressure 110/71, pulse 86, temperature  97.8 F (36.6 C), height '5\' 1"'$  (1.549 m), weight 144 lb (65.3 kg), SpO2 99 %. Body mass index is 27.21 kg/m.  General: Cooperative, alert, well developed, in no acute distress. HEENT:  Conjunctivae and lids unremarkable. Cardiovascular: Regular rhythm.  Lungs: Normal work of breathing. Neurologic: No focal deficits.   Lab Results  Component Value Date   CREATININE 0.82 05/24/2022   BUN 12 05/24/2022   NA 141 05/24/2022   K 4.8 05/24/2022   CL 102 05/24/2022   CO2 25 05/24/2022   Lab Results  Component Value Date   ALT 36 (H) 05/24/2022   AST 20 05/24/2022   ALKPHOS 98 05/24/2022   BILITOT 0.4 05/24/2022   Lab Results  Component Value Date   HGBA1C 5.6 05/24/2022   HGBA1C 5.7 (H) 02/22/2022   HGBA1C 6.3 09/26/2021   Lab Results  Component Value Date   INSULIN 7.7 05/24/2022   Lab Results  Component Value Date   TSH 1.34 06/06/2022   Lab Results  Component Value Date   CHOL 189 05/24/2022   HDL 49 05/24/2022   LDLCALC 124 (H) 05/24/2022   TRIG 90 05/24/2022   CHOLHDL 3.9 05/24/2022   Lab Results  Component Value Date   VD25OH 62.0 05/24/2022   VD25OH 55.0 02/22/2022   VD25OH 30.3 11/29/2021   Lab Results  Component Value Date   WBC 6.9 11/16/2021   HGB 13.5 11/16/2021   HCT 43.2 11/16/2021   MCV 81.4 11/16/2021   PLT 327.0 11/16/2021    Attestation Statements:   Reviewed by clinician on day of visit: allergies, medications, problem list, medical history, surgical history, family history, social history, and previous encounter notes.  I, Kathlene November, BS, CMA, am acting as transcriptionist for [employer], DO.   I have reviewed the above documentation for accuracy and completeness, and I agree with the above. [name], D.O.  The [place] was signed into law in 2016 which includes the topic of electronic health records.  This provides immediate access to information in MyChart.  This includes consultation notes, operative notes, office notes, lab results and pathology reports.  If you have any questions about what you read please let us know at your next visit so we can discuss your concerns and take  corrective action if need be.  We are right here with you.

## 2022-07-04 ENCOUNTER — Ambulatory Visit (INDEPENDENT_AMBULATORY_CARE_PROVIDER_SITE_OTHER): Payer: 59 | Admitting: Family Medicine

## 2022-07-04 ENCOUNTER — Encounter (INDEPENDENT_AMBULATORY_CARE_PROVIDER_SITE_OTHER): Payer: Self-pay | Admitting: Family Medicine

## 2022-07-04 VITALS — BP 118/74 | HR 80 | Temp 98.1°F | Ht 61.0 in | Wt 141.0 lb

## 2022-07-04 DIAGNOSIS — E669 Obesity, unspecified: Secondary | ICD-10-CM

## 2022-07-04 DIAGNOSIS — Z6826 Body mass index (BMI) 26.0-26.9, adult: Secondary | ICD-10-CM | POA: Diagnosis not present

## 2022-07-04 DIAGNOSIS — E559 Vitamin D deficiency, unspecified: Secondary | ICD-10-CM | POA: Diagnosis not present

## 2022-07-09 NOTE — Progress Notes (Signed)
Chief Complaint:   OBESITY [name] is here to discuss her progress with her obesity treatment plan along with follow-up of her obesity related diagnoses. [name] is on keeping a food journal and adhering to recommended goals of 1000 calories and 90 grams of protein and states she is following her eating plan approximately 100% of the time. [name] states she is working out with weights 60 minutes 2-3 times per week.  Today's visit was #: 15 Starting weight: 190 lbs Starting date: 11/29/2021 Today's weight: 141 lbs Today's date: 07/04/2022 Total lbs lost to date: 49 lbs Total lbs lost since last in-office visit: 3 lbs  Interim History: [name] lost 3 lbs of fat mass. She has been doing little walking but more weights 3 days a weeks for 60 minutes each time. She reports to be more active around the house, still at 10,000 steps per day.  Subjective:   1. Vitamin D deficiency She is currently taking OTC vitamin D 2000 Units each day. She denies nausea, vomiting or muscle weakness.  Lab Results  Component Value Date   VD25OH 62.0 05/24/2022   VD25OH 55.0 02/22/2022   VD25OH 30.3 11/29/2021    Assessment/Plan:  No orders of the defined types were placed in this encounter.   There are no discontinued medications.   No orders of the defined types were placed in this encounter.    1. Vitamin D deficiency Low Vitamin D level contributes to fatigue and are associated with obesity, breast, and colon cancer. She agrees to continue to take prescription Vitamin D '@50'$ ,000 IU every week and will follow-up for routine testing of Vitamin D, at least 2-3 times per year to avoid over-replacement. [name] will continue OTC Vitamin D 2000 Units daily.  2. Obesity with current BMI of 26.7 [name] is currently in the action stage of change. As such, her goal is to continue with weight loss efforts. She has agreed to keeping a food journal and adhering to recommended goals of 1000 calories and 90 grams of  protein  daily. [name] will continue with lifting weights and strength training.  Exercise goals: As is.  Behavioral modification strategies: increasing lean protein intake, decreasing simple carbohydrates, and planning for success.  [name] has agreed to follow-up with our clinic in 2 weeks. She was informed of the importance of frequent follow-up visits to maximize her success with intensive lifestyle modifications for her multiple health conditions.   Objective:   Blood pressure 118/74, pulse 80, temperature 98.1 F (36.7 C), height '5\' 1"'$  (1.549 m), weight 141 lb (64 kg), SpO2 98 %. Body mass index is 26.64 kg/m.  General: Cooperative, alert, well developed, in no acute distress. HEENT: Conjunctivae and lids unremarkable. Cardiovascular: Regular rhythm.  Lungs: Normal work of breathing. Neurologic: No focal deficits.   Lab Results  Component Value Date   CREATININE 0.82 05/24/2022   BUN 12 05/24/2022   NA 141 05/24/2022   K 4.8 05/24/2022   CL 102 05/24/2022   CO2 25 05/24/2022   Lab Results  Component Value Date   ALT 36 (H) 05/24/2022   AST 20 05/24/2022   ALKPHOS 98 05/24/2022   BILITOT 0.4 05/24/2022   Lab Results  Component Value Date   HGBA1C 5.6 05/24/2022   HGBA1C 5.7 (H) 02/22/2022   HGBA1C 6.3 09/26/2021   Lab Results  Component Value Date   INSULIN 7.7 05/24/2022   Lab Results  Component Value Date   TSH 1.34 06/06/2022   Lab Results  Component Value Date   CHOL 189 05/24/2022   HDL 49 05/24/2022   LDLCALC 124 (H) 05/24/2022   TRIG 90 05/24/2022   CHOLHDL 3.9 05/24/2022   Lab Results  Component Value Date   VD25OH 62.0 05/24/2022   VD25OH 55.0 02/22/2022   VD25OH 30.3 11/29/2021   Lab Results  Component Value Date   WBC 6.9 11/16/2021   HGB 13.5 11/16/2021   HCT 43.2 11/16/2021   MCV 81.4 11/16/2021   PLT 327.0 11/16/2021   No results found for: "IRON", "TIBC", "FERRITIN"  Attestation Statements:   Reviewed by clinician on day of visit:  allergies, medications, problem list, medical history, surgical history, family history, social history, and previous encounter notes.  Time spent on visit including pre-visit chart review and post-visit care and charting was 20 minutes.   ILennette Bihari, CMA, am acting as transcriptionist for Dr. Raliegh Scarlet, DO.  I have reviewed the above documentation for accuracy and completeness, and I agree with the above. [name], D.O.  The [place] was signed into law in 2016 which includes the topic of electronic health records.  This provides immediate access to information in MyChart.  This includes consultation notes, operative notes, office notes, lab results and pathology reports.  If you have any questions about what you read please let us know at your next visit so we can discuss your concerns and take corrective action if need be.  We are right here with you.

## 2022-07-11 DIAGNOSIS — R102 Pelvic and perineal pain: Secondary | ICD-10-CM | POA: Diagnosis not present

## 2022-07-18 ENCOUNTER — Ambulatory Visit (INDEPENDENT_AMBULATORY_CARE_PROVIDER_SITE_OTHER): Payer: 59 | Admitting: Family Medicine

## 2022-07-23 ENCOUNTER — Telehealth: Payer: Self-pay | Admitting: Family Medicine

## 2022-07-23 NOTE — Telephone Encounter (Signed)
Encourage patient to contact the pharmacy for refills or they can request refills through [facility]  (Please schedule appointment if patient has not been seen in over a year)    WHAT [place] TO: [facility] [phone]  MEDICATION NAME & DOSE: acyclovir ointment 5%  NOTES/COMMENTS FROM PATIENT: pt does not have current rx for this medication has been using her godmother's and says it really helps with her fever blisters      Front office please notify patient: It takes 48-72 hours to process rx refill requests Ask patient to call pharmacy to ensure rx is ready before heading there.

## 2022-07-24 NOTE — Telephone Encounter (Signed)
Pt will need an appointment to get a medication that has not previously been prescribed

## 2022-07-25 NOTE — Telephone Encounter (Signed)
Pt needs to have a virtual in order to speak to Dr Carlota Raspberry

## 2022-07-25 NOTE — Telephone Encounter (Signed)
Needs a virtual if she wishes to speak with him

## 2022-07-26 NOTE — Telephone Encounter (Signed)
Spoke to patient and she would like to discuss at follow up given no current fever blister

## 2022-07-27 ENCOUNTER — Telehealth: Payer: Self-pay

## 2022-07-27 NOTE — Telephone Encounter (Signed)
Spoke with the patient regarding the referral to GYN oncology. Patient scheduled as new patient with Dr Berline Lopes on 08/03/2022. Patient given an arrival time of 10:00am.  Explained to the patient the the doctor will perform a pelvic exam at this visit. Patient given the policy that no visitors under the 16 yrs are allowed in the [place]. Patient given the address/phone number for the clinic and that the center offers free valet service.

## 2022-07-31 DIAGNOSIS — R8271 Bacteriuria: Secondary | ICD-10-CM | POA: Diagnosis not present

## 2022-08-01 ENCOUNTER — Encounter (INDEPENDENT_AMBULATORY_CARE_PROVIDER_SITE_OTHER): Payer: Self-pay | Admitting: Family Medicine

## 2022-08-01 ENCOUNTER — Encounter: Payer: Self-pay | Admitting: Gynecologic Oncology

## 2022-08-01 ENCOUNTER — Ambulatory Visit (INDEPENDENT_AMBULATORY_CARE_PROVIDER_SITE_OTHER): Payer: 59 | Admitting: Family Medicine

## 2022-08-01 VITALS — BP 110/76 | HR 77 | Temp 98.0°F | Ht 61.0 in | Wt 140.0 lb

## 2022-08-01 DIAGNOSIS — E669 Obesity, unspecified: Secondary | ICD-10-CM

## 2022-08-01 DIAGNOSIS — Z6826 Body mass index (BMI) 26.0-26.9, adult: Secondary | ICD-10-CM

## 2022-08-01 DIAGNOSIS — R638 Other symptoms and signs concerning food and fluid intake: Secondary | ICD-10-CM

## 2022-08-01 DIAGNOSIS — E559 Vitamin D deficiency, unspecified: Secondary | ICD-10-CM | POA: Diagnosis not present

## 2022-08-03 ENCOUNTER — Other Ambulatory Visit: Payer: Self-pay

## 2022-08-03 ENCOUNTER — Inpatient Hospital Stay: Payer: 59 | Attending: Gynecologic Oncology | Admitting: Gynecologic Oncology

## 2022-08-03 ENCOUNTER — Inpatient Hospital Stay: Payer: 59

## 2022-08-03 ENCOUNTER — Encounter: Payer: Self-pay | Admitting: Gynecologic Oncology

## 2022-08-03 VITALS — BP 116/77 | HR 68 | Temp 97.9°F | Resp 18 | Wt 145.0 lb

## 2022-08-03 DIAGNOSIS — N736 Female pelvic peritoneal adhesions (postinfective): Secondary | ICD-10-CM | POA: Diagnosis not present

## 2022-08-03 DIAGNOSIS — Z90722 Acquired absence of ovaries, bilateral: Secondary | ICD-10-CM | POA: Diagnosis not present

## 2022-08-03 DIAGNOSIS — G8929 Other chronic pain: Secondary | ICD-10-CM | POA: Insufficient documentation

## 2022-08-03 DIAGNOSIS — Z8742 Personal history of other diseases of the female genital tract: Secondary | ICD-10-CM

## 2022-08-03 DIAGNOSIS — Z800 Family history of malignant neoplasm of digestive organs: Secondary | ICD-10-CM | POA: Diagnosis not present

## 2022-08-03 DIAGNOSIS — K5909 Other constipation: Secondary | ICD-10-CM | POA: Diagnosis not present

## 2022-08-03 DIAGNOSIS — K5900 Constipation, unspecified: Secondary | ICD-10-CM | POA: Insufficient documentation

## 2022-08-03 DIAGNOSIS — M6289 Other specified disorders of muscle: Secondary | ICD-10-CM | POA: Diagnosis not present

## 2022-08-03 DIAGNOSIS — R1030 Lower abdominal pain, unspecified: Secondary | ICD-10-CM

## 2022-08-03 DIAGNOSIS — N809 Endometriosis, unspecified: Secondary | ICD-10-CM | POA: Diagnosis not present

## 2022-08-03 DIAGNOSIS — N951 Menopausal and female climacteric states: Secondary | ICD-10-CM | POA: Insufficient documentation

## 2022-08-03 DIAGNOSIS — N871 Moderate cervical dysplasia: Secondary | ICD-10-CM

## 2022-08-03 NOTE — Patient Instructions (Signed)
It was a pleasure meeting you today.  We discussed doing multiple things today to try to help address your pelvic pain.  We will test your hormone levels today to see if you are pre or postmenopausal.  If you are premenopausal, this would give [place] the opportunity to give you a medication to suppress your hormone production to see if this helps with your pelvic pain.  I have ordered a pelvic MRI to assess the presence of that left ovary as well as any other cause for your pelvic pain.  We also discussed the benefit of pelvic floor physical therapy.  I have placed a referral for this today.  Please reach out to your gastroenterologist regarding any additional help or medications that we could add to improve your constipation.

## 2022-08-03 NOTE — Progress Notes (Signed)
GYNECOLOGIC ONCOLOGY NEW PATIENT CONSULTATION   Patient Name: [name]  Patient Age: 51 y.o. Date of Service: 08/03/22 Referring Provider: Dr. Louretta Shorten  Primary Care Provider: Lafonda Mosses, MD Consulting Provider: Jeral Pinch, MD   Assessment/Plan:  [phone] with history of endometriosis, multiple pelvic surgeries, and chronic pelvic pain.  I spent some time discussing with the patient her history, both in terms of her symptoms but also her surgeries.  I reviewed all of her operative reports from the surgeries that I outlined below as well as pathology reports.  From my review, I do not see evidence that her left ovary was removed.  There has been an assumption that she is postmenopausal and had both ovaries removed.  I am not convinced that this is the case.  While she is above the average age of menopause, I think proving, by hormone testing, whether she is menopausal or not would be helpful.  We will plan to get an [facility] and [facility] today.  The patient was understandably somewhat surprised by our discussion about her left ovary.  She has been under the impression that both of her ovaries were removed.  I showed her the pathology reports that I had reviewed that would support that her left ovary was not removed.  If hormonal testing indicates that she is premenopausal, I would recommend hormonal suppression (such as with a GnRH agonist).  If this were to ameliorate her symptoms, then she could either continue with hormonal suppression or consideration of excision of her left ovary.  We also discussed her diagnosis of endometriosis.  There is mention of small volume of disease of endometriosis in the cul-de-sac during one of her pelvic surgeries.  She had evidence of endometriosis from her right ureteral resection at the time of her surgery in 2018.  By my review, there are no findings in her operative reports that otherwise suggest a large volume of endometriosis in her abdomen.   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  She had a recent ultrasound which showed no adnexal masses.  Unfortunately, I think it is going to be quite difficult to evaluate the left adnexa by ultrasound.  I have recommended a pelvic MRI to both look at that left ovary as well as to evaluate for any pathology that may be contributing to her pelvic pain.  Given its chronicity (present since she was a teenager), I am suspicious that that left ovary may be active still.  The patient also has a history of constipation.  She is using a stool softener, has previously used MiraLAX without good effect.  She follows with a gastroenterologist given her family history of colon cancer.  She has used Ex-Lax before with good relief.  I have encouraged her to increase her bowel regimen to see if she can get her bowels moving a little bit more regularly.  I think this might help with her pelvic pain symptoms as well.  In the setting of chronic pelvic pain, I also discussed the benefit of pelvic floor physical therapy.  I think she probably has some element of pelvic floor dysfunction.  Patient was open to this idea and a referral was placed to pelvic floor PT.  Ultimately, the patient would likely benefit from seeing a pelvic pain specialist.  A copy of this note was sent to the patient's referring provider.   75 minutes of total time was  spent for this patient encounter, including preparation, face-to-face counseling with the patient and coordination of care, and documentation of the encounter.  Jeral Pinch, MD  Division of Gynecologic Oncology  Department of Obstetrics and Gynecology  [facility]  ___________________________________________  Chief Complaint: Chief Complaint  Patient presents with   Dysplasia of cervix, high grade CIN 2    History of Present Illness:  [name]  is a 51 y.o. y.o. female who is seen in consultation at the request of Dr. Corinna Capra for an evaluation of chronic pelvic pain.  In 2002, patient underwent laparoscopic left partial salpingectomy for a ruptured tubal pregnancy.  Findings at the time of surgery included a ruptured tubal pregnancy of the distal one half of the left fallopian tube.  Left partial salpingectomy was performed as well as drainage of a small left corpus luteum cyst.  Uterus was mildly enlarged but otherwise normal-appearing.  Right adnexa normal appearing.  100 cc of hemoperitoneum was noted. In 2002, patient underwent laparoscopy with lysis of adhesions and right salpingostomy with removal of ectopic pregnancy.  Findings at that time included adhesions from the bowel and the omentum to the anterior surface of the uterus.  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 In 06/2002, the patient underwent laparoscopic assisted vaginal hysterectomy and ablation of endometriosis implants.  This was in the setting of abnormal uterine bleeding, pelvic pain, and fibroids.  Findings at the time of surgery included pelvic adhesions to the left tubo-ovarian complex, mild endometriosis within the cul-de-sac.  Normal-appearing bilateral ovaries, normal abdominal survey otherwise.  Pathology from that surgery should unremarkable cervix, proliferative endometrium without hyperplasia or malignancy, intramural leiomyoma, and uterine serosa with fibrous adhesions, no endometriosis or malignancy. In 05/2003, the patient underwent laparoscopy with extensive lysis of adhesions and right salpingectomy.  Findings at the time of surgery included extensive pelvic adhesions to the bowel and omentum.  Bilateral ovaries normal in appearance.  No endometriosis noted.   In 2018, she underwent laparoscopic right radical nephrectomy with distal ureterectomy and pelvic mass excision as well as laparoscopic right oophorectomy.  This was in the  setting of a right atrophic nonfunctioning kidney and right distal ureteral obstruction secondary to extrinsic mass.  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  She underwent concurrent resection of this tumor as well as upper endoscopy at the time of her procedure.  Pathology from this showed benign gastric mucosa with underlying ectopic pancreas tissue as well as 1 benign lymph node.  Benign kidney with chronic inflammation and atrophy.  Distal ureter with endometriosis.  Benign ovary with corpus luteum and cystic follicle. [facility]: No adnexal masses or free fluid seen.  Patient reports a history of pelvic pain since she was a teenager.  She describes this as bilateral pelvic pain that is throbbing in nature or feels "like when you are pregnant and the baby is stomping".  Previously, she would have this pain frequently.  She thinks it may have gotten better during her pregnancies, but is unsure whether she was distracted by other pregnancy symptoms such as nausea.  More recently, she describes the pelvic pain as constant.  There is not much fluctuation except with intercourse, when she will have worsening of pain either that night and/or the next day.  She uses a heating pad for this pain.  Previously  she would take Motrin, which she cannot take now given prior nephrectomy.  Patient endorses a history of heavy, painful menses before her hysterectomy.  For the last 2 or 3 years, she describes having constipation.  Prior to this, she would have intermittent constipation.  More recently, she would go as long as a week between bowel movements.  She has worked with gastroenterology and is now using calm and Colace.  She has a bowel movement every other day to every 4 days.  She endorses constipation with bowel movements when she is constipated, otherwise denies pain with bowel function.   She denies any bleeding with bowel movements.  She endorses some stress urinary incontinence, has increased frequency since she started taking Lasix.  Denies other urinary symptoms other than having multiple urinary tract infections since her surgery in 2018.  Patient denies any vaginal bleeding or discharge.  She has a history of cryotherapy for cervical dysplasia at age 70.  She endorses hot flashes which started at the time of her hysterectomy.  Patient has been on Mounjaro.  She has lost approximately 60 pounds since starting the medication.  PAST MEDICAL HISTORY:  Past Medical History:  Diagnosis Date   Arthritis    Asthma    Complication of anesthesia    respirations slow down after coming out from Anesthesia   Endometriosis    GERD (gastroesophageal reflux disease)    H/O right nephrectomy    Mass of right kidney    Other fatigue    Palpitations    Post-operative nausea and vomiting    Shortness of breath on exertion      PAST SURGICAL HISTORY:  Past Surgical History:  Procedure Laterality Date   CERVICAL DISC SURGERY     CESAREAN SECTION     [phone])   CHOLECYSTECTOMY     COLONOSCOPY  12/17/2017   Dr.Stark   EUS N/A 03/14/2017   Procedure: UPPER ENDOSCOPIC ULTRASOUND (EUS) LINEAR;  Surgeon: Milus Banister, MD;  Location: [facility];  Service: Endoscopy;  Laterality: N/A;   I & D EXTREMITY Left 05/26/2021   Procedure: IRRIGATION AND DEBRIDEMENT EXTREMITY WITH COMPLEX CLOSURE OF WOUND;  Surgeon: Meredith Pel, MD;  Location: [facility];  Service: Orthopedics;  Laterality: Left;   KNEE ARTHROSCOPY Left 05/26/2021   Procedure: ARTHROSCOPIC KNEE WASHOUT;  Surgeon: Meredith Pel, MD;  Location: [place];  Service: Orthopedics;  Laterality: Left;   LAPAROSCOPIC ABDOMINAL EXPLORATION N/A 05/17/2017   Procedure: LAPAROSCOPIC POSSIBLE OPEN REMOVAL OF STOMACH MASS;  Surgeon: Jackolyn Confer, MD;  Location: [facility];  Service: General;  Laterality: N/A;    LAPAROSCOPIC ASSISTED VAGINAL HYSTERECTOMY  2003   LAPAROSCOPIC NEPHRECTOMY Right 05/17/2017   Procedure: RIGHT RADICAL NEPHRECTOMY, URETERECTOMY;  Surgeon: Ardis Hughs, MD;  Location: [facility];  Service: Urology;  Laterality: Right;   tubal preganancy      OB/GYN HISTORY:  OB History  Gravida Para Term Preterm AB Living  '7 3 3   4 3  '$ SAB IAB Ectopic Multiple Live Births  4            # Outcome Date GA Lbr Len/2nd Weight Sex Delivery Anes PTL Lv  7 Term 2001          6 Term 1997          5 Term 1991          4 SAB           3 SAB  2 SAB           1 SAB             No LMP recorded. Patient has had a hysterectomy.  Age at menarche: 74 Age at menopause: menses stopped at 59, hot flashes started at the time of her surgery Hx of HRT: Denies Hx of STDs: Denies Last pap: 11/2021 - NML History of abnormal pap smears: yes, history of cryotherapy as a teen  SCREENING STUDIES:  Last mammogram: 09/2021  Last colonoscopy: 09/2021  MEDICATIONS: Outpatient Encounter Medications as of 08/03/2022  Medication Sig   albuterol (PROVENTIL HFA) 108 (90 Base) MCG/ACT inhaler Inhale 2 puffs into the lungs every 6 (six) hours as needed for wheezing or shortness of breath.   CEQUA 0.09 % SOLN Apply 1 drop to eye 2 (two) times daily.   Cholecalciferol (VITAMIN D3) 25 MCG (1000 UT) CAPS Take 2 capsules (2,000 Units total) by mouth daily.   furosemide (LASIX) 20 MG tablet Take 0.5 tablets (10 mg total) by mouth daily.   methimazole (TAPAZOLE) 5 MG tablet Take 0.5 tablets (2.5 mg total) by mouth as directed. Half a tablet 4 days a week   MOUNJARO 15 MG/0.5ML Pen SMARTSIG:1 pre-filled pen syringe SUB-Q Once a Week   NON FORMULARY Cultrelle   ondansetron (ZOFRAN) 4 MG tablet Take 1 tablet (4 mg total) by mouth every 8 (eight) hours as needed for nausea or vomiting.   pantoprazole (PROTONIX) 40 MG tablet Take 1 tablet (40 mg total) by mouth 2 (two) times daily.   predniSONE (DELTASONE) 20  MG tablet 3 by mouth for 3 days, then 2 by mouth for 2 days, then 1 by mouth for 2 days, then 1/2 by mouth for 2 days.   [DISCONTINUED] tirzepatide (MOUNJARO) 12.5 MG/0.5ML Pen Inject 12.5 mg into the skin once a week.   [DISCONTINUED] VALIUM 10 MG tablet 10 mg at bedtime.   No facility-administered encounter medications on file as of 08/03/2022.    ALLERGIES:  Allergies  Allergen Reactions   Amoxicillin Anaphylaxis    All "cillins" pt was 59-25 years old  Has patient had a PCN reaction causing immediate rash, facial/tongue/throat swelling, SOB or lightheadedness with hypotension: yes Has patient had a PCN reaction causing severe rash involving mucus membranes or skin necrosis: unknown Has patient had a PCN reaction that required hospitalization yes Has patient had a PCN reaction occurring within the last 10 years:no If all of the above answers are "NO", then may proceed with Cephalosporin use.    Ibuprofen Anaphylaxis and Hives   Keflex [Cephalexin] Anaphylaxis   Penicillins Anaphylaxis    Has patient had a PCN reaction causing immediate rash, facial/tongue/throat swelling, SOB or lightheadedness with hypotension:yes Has patient had a PCN reaction causing severe rash involving mucus membranes or skin necrosis: unknown Has patient had a PCN reaction that required hospitalization yes  Has patient had a PCN reaction occurring within the last 10 years: no If all of the above answers are "NO", then may proceed with Cephalosporin use.    Baby Powder [Methylbenzethonium] Hives   Doxycycline Diarrhea and Rash     FAMILY HISTORY:  Family History  Problem Relation Age of Onset   Alcoholism Mother    Thyroid disease Mother    Stroke Mother    Hypertension Mother    Cancer Mother 76       "back cancer"   Other Mother        hysterectomy at 32 for "  pre-cancerous cervical cells"   Obesity Father    Cancer Father    Hypertension Father    Colon cancer Father 48   Cervical cancer Sister         full sister dx. unspecified age   Colon polyps Sister        unspecified number   Colon polyps Sister        paternal half-sister w/ colon polyps dx. age 47 or younger - unspecified number   Colon cancer Sister 7       paternal half-sister    Colon polyps Sister        paternal half-sister; unspecified number   Colon cancer Sister        paternal half-sister dx. before age 66; has had two colon cancers   Breast cancer Sister        paternal half-sister dx under age 33   Renal cancer Sister 85       paternal half-sister dx. w/ "LRC - lymphoid renal cancer"   Cancer Sister 69       paternal half-sister dx cancer of wrist that was not a skin cancer   Breast cancer Maternal Grandmother        unspecified age   Diabetes Maternal Grandmother    Diabetes Paternal Grandfather    Colon polyps [name] 17       has had a "bleeding polyp"   Colon cancer Paternal Aunt        (x2) paternal aunts with colon cancer at unspecified ages; lim info   Breast cancer Paternal Aunt        (x3) paternal aunts with breast cancer at unspecified ages; lim info   Colon cancer Paternal Uncle        dx. unspecified age; lim info   Cervical cancer Other 88       niece; +hysterectomy   Alcoholism Other    Obesity Other    Esophageal cancer Neg Hx    Stomach cancer Neg Hx      SOCIAL HISTORY:  Social Connections: Not on file    REVIEW OF SYSTEMS:  Denies appetite changes, fevers, chills, fatigue, unexplained weight changes. Denies hearing loss, neck lumps or masses, mouth sores, ringing in ears or voice changes. Denies cough or wheezing.  Denies shortness of breath. Denies chest pain or palpitations. Denies leg swelling. Denies abdominal distention, pain, blood in stools, constipation, diarrhea, nausea, vomiting, or early satiety. Denies pain with intercourse, dysuria, frequency, hematuria or incontinence. Denies hot flashes, pelvic pain, vaginal bleeding or vaginal discharge.   Denies joint pain,  back pain or muscle pain/cramps. Denies itching, rash, or wounds. Denies dizziness, headaches, numbness or seizures. Denies swollen lymph nodes or glands, denies easy bruising or bleeding. Denies anxiety, depression, confusion, or decreased concentration.  Physical Exam:  Vital Signs for this encounter:  Blood pressure 116/77, pulse 68, temperature 97.9 F (36.6 C), temperature source Oral, resp. rate 18, weight 145 lb (65.8 kg). Body mass index is 27.4 kg/m. General: Alert, oriented, no acute distress.  HEENT: Normocephalic, atraumatic. Sclera anicteric.  Chest: Clear to auscultation bilaterally. No wheezes, rhonchi, or rales. Cardiovascular: Regular rate and rhythm, no murmurs, rubs, or gallops.  Abdomen: Normoactive bowel sounds. Soft, nondistended, nontender to palpation. No masses or hepatosplenomegaly appreciated. No palpable fluid wave.  Multiple well-healed laparoscopic incisions, larger mini-lap incision. Extremities: Grossly normal range of motion. Warm, well perfused. No edema bilaterally.  Skin: No rashes or lesions.  Lymphatics: No cervical, supraclavicular, or inguinal  adenopathy.  GU:  Normal external female genitalia. No lesions. No discharge or bleeding.             Bladder/urethra:  No lesions or masses, well supported bladder             Vagina: Well rugated, no lesions or masses.             Cervix/uterus: surgically absent.  Vaginal cuff is normal in appearance.  No masses.             Adnexa: No masses or tenderness on exam.  Rectal: Deferred.  LABORATORY AND RADIOLOGIC DATA:  Outside medical records were reviewed to synthesize the above history, along with the history and physical obtained during the visit.   Lab Results  Component Value Date   WBC 6.9 11/16/2021   HGB 13.5 11/16/2021   HCT 43.2 11/16/2021   PLT 327.0 11/16/2021   GLUCOSE 83 05/24/2022   CHOL 189 05/24/2022   TRIG 90 05/24/2022   HDL 49 05/24/2022   LDLCALC 124 (H) 05/24/2022   ALT 36  (H) 05/24/2022   AST 20 05/24/2022   NA 141 05/24/2022   K 4.8 05/24/2022   CL 102 05/24/2022   CREATININE 0.82 05/24/2022   BUN 12 05/24/2022   CO2 25 05/24/2022   TSH 1.34 06/06/2022   HGBA1C 5.6 05/24/2022

## 2022-08-04 LAB — LUTEINIZING HORMONE: LH: 45.5 m[IU]/mL

## 2022-08-04 LAB — FOLLICLE STIMULATING HORMONE: FSH: 112 m[IU]/mL

## 2022-08-06 ENCOUNTER — Telehealth: Payer: Self-pay

## 2022-08-06 NOTE — Telephone Encounter (Signed)
Per Dr.Tucker, Pt is aware of recent lab results shows she is postmenopausal. She states she knew it. She is also aware Dr.Tucker will call her with the MRI results once they are received. Pt voiced an understanding.

## 2022-08-07 ENCOUNTER — Ambulatory Visit (HOSPITAL_COMMUNITY)
Admission: RE | Admit: 2022-08-07 | Discharge: 2022-08-07 | Disposition: A | Discharge status: Home / Self Care | Payer: 59 | Source: Ambulatory Visit | Attending: Gynecologic Oncology | Admitting: Gynecologic Oncology

## 2022-08-07 DIAGNOSIS — R1030 Lower abdominal pain, unspecified: Secondary | ICD-10-CM | POA: Insufficient documentation

## 2022-08-07 DIAGNOSIS — G8929 Other chronic pain: Secondary | ICD-10-CM | POA: Diagnosis not present

## 2022-08-07 DIAGNOSIS — Z780 Asymptomatic menopausal state: Secondary | ICD-10-CM | POA: Diagnosis not present

## 2022-08-07 DIAGNOSIS — R102 Pelvic and perineal pain: Secondary | ICD-10-CM | POA: Diagnosis not present

## 2022-08-07 DIAGNOSIS — Z90710 Acquired absence of both cervix and uterus: Secondary | ICD-10-CM | POA: Diagnosis not present

## 2022-08-07 MED ORDER — GADOPICLENOL 0.5 MMOL/ML IV SOLN
6.5000 mL | Freq: Once | INTRAVENOUS | Status: AC | PRN
  Administered 2022-08-07: 6.5 mL via INTRAVENOUS

## 2022-08-07 NOTE — Progress Notes (Signed)
Chief Complaint:   OBESITY [name] is here to discuss her progress with her obesity treatment plan along with follow-up of her obesity related diagnoses. [name] is on keeping a food journal and adhering to recommended goals of 1000 calories and 90 grams protein and states she is following her eating plan approximately 20% of the time. [name] states she is walking 60 minutes 2 times per week.  Today's visit was #: 16 Starting weight: 190 lbs Starting date: 11/29/2021 Today's weight: 140 lbs Today's date: 08/01/2022 Total lbs lost to date: 50 Total lbs lost since last in-office visit: 1  Interim History: [name] is upset with being late to her last OV and being rescheduled. A couple of other concerns also discussed with pt. Pt has been off Mounjaro for a week and a half, as she couldn't get it and pulled back muscle, but is not taking anything for it.  Subjective:   1. Vitamin D deficiency She is currently taking OTC vitamin D 2000 IU each day. She denies nausea, vomiting or muscle weakness.  2. Abnormal craving [name] reports much improved symptoms with Mounjaro.  Assessment/Plan:  No orders of the defined types were placed in this encounter.   There are no discontinued medications.   No orders of the defined types were placed in this encounter.    1. Vitamin D deficiency Low Vitamin D level contributes to fatigue and are associated with obesity, breast, and colon cancer. She agrees to continue OTC Vitamin D 2,000 IU daily and will follow-up for routine testing of Vitamin D, at least 2-3 times per year to avoid over-replacement.  2. Abnormal craving Continue Mounjaro per doc. Continue prudent nutritional plan.  3. Obesity with current BMI of 26.5 [name] is currently in the action stage of change. As such, her goal is to continue with weight loss efforts. She has agreed to keeping a food journal and adhering to recommended goals of 1000 calories and 90 grams protein. Bring in journaling  log to next OV.  Exercise goals:  Walk a quarter of a mile twice a day and stretch back muscles. If symptoms worsen or do not get better, follow up with PCP.  Behavioral modification strategies: increasing lean protein intake, decreasing simple carbohydrates, and planning for success.  [name] has agreed to follow-up with our clinic in 2-3 weeks. She was informed of the importance of frequent follow-up visits to maximize her success with intensive lifestyle modifications for her multiple health conditions.   Objective:   Blood pressure 110/76, pulse 77, temperature 98 F (36.7 C), temperature source Oral, height '5\' 1"'$  (1.549 m), weight 140 lb (63.5 kg), SpO2 95 %. Body mass index is 26.45 kg/m.  General: Cooperative, alert, well developed, in no acute distress. HEENT: Conjunctivae and lids unremarkable. Cardiovascular: Regular rhythm.  Lungs: Normal work of breathing. Neurologic: No focal deficits.   Lab Results  Component Value Date   CREATININE 0.82 05/24/2022   BUN 12 05/24/2022   NA 141 05/24/2022   K 4.8 05/24/2022   CL 102 05/24/2022   CO2 25 05/24/2022   Lab Results  Component Value Date   ALT 36 (H) 05/24/2022   AST 20 05/24/2022   ALKPHOS 98 05/24/2022   BILITOT 0.4 05/24/2022   Lab Results  Component Value Date   HGBA1C 5.6 05/24/2022   HGBA1C 5.7 (H) 02/22/2022   HGBA1C 6.3 09/26/2021   Lab Results  Component Value Date   INSULIN 7.7 05/24/2022   Lab Results  Component Value Date   TSH 1.34 06/06/2022   Lab Results  Component Value Date   CHOL 189 05/24/2022   HDL 49 05/24/2022   LDLCALC 124 (H) 05/24/2022   TRIG 90 05/24/2022   CHOLHDL 3.9 05/24/2022   Lab Results  Component Value Date   VD25OH 62.0 05/24/2022   VD25OH 55.0 02/22/2022   VD25OH 30.3 11/29/2021   Lab Results  Component Value Date   WBC 6.9 11/16/2021   HGB 13.5 11/16/2021   HCT 43.2 11/16/2021   MCV 81.4 11/16/2021   PLT 327.0 11/16/2021    Attestation Statements:    Reviewed by clinician on day of visit: allergies, medications, problem list, medical history, surgical history, family history, social history, and previous encounter notes.  Time spent on visit including pre-visit chart review and post-visit care and charting was 30 minutes.   I, Kathlene November, BS, CMA, am acting as transcriptionist for [employer], DO.   I have reviewed the above documentation for accuracy and completeness, and I agree with the above. [name], D.O.  The [place] was signed into law in 2016 which includes the topic of electronic health records.  This provides immediate access to information in MyChart.  This includes consultation notes, operative notes, office notes, lab results and pathology reports.  If you have any questions about what you read please let us know at your next visit so we can discuss your concerns and take corrective action if need be.  We are right here with you.

## 2022-08-09 ENCOUNTER — Ambulatory Visit (HOSPITAL_COMMUNITY): Admission: RE | Admit: 2022-08-09 | Payer: 59 | Source: Ambulatory Visit

## 2022-08-09 ENCOUNTER — Telehealth: Payer: Self-pay | Admitting: Gynecologic Oncology

## 2022-08-09 NOTE — Telephone Encounter (Signed)
Called the patient to discuss her MRI results.  No significant pelvic pathology to explain her pain.  I see what looks like a atrophic ovary on the left.  Also reviewed that her hormone levels indicate menopause.  Patient is working on her constipation.  Has spoken with the scheduler for pelvic floor physical therapy.  She is very hopeful that she will see some improvement in her pelvic pain with both of these interventions.  I have asked her to give me an update in the next several months.  I also let her know that I will reach out to her OB/GYN to pass along our discussion.  She and I also discussed the possibility of having her see a pelvic pain specialist if she does not have some improvement with these interventions.  Jeral Pinch MD Gynecologic Oncology

## 2022-08-21 ENCOUNTER — Encounter: Payer: Self-pay | Admitting: Physical Therapy

## 2022-08-21 ENCOUNTER — Other Ambulatory Visit: Payer: Self-pay

## 2022-08-21 ENCOUNTER — Ambulatory Visit: Payer: 59 | Attending: Gynecologic Oncology | Admitting: Physical Therapy

## 2022-08-21 DIAGNOSIS — R102 Pelvic and perineal pain: Secondary | ICD-10-CM | POA: Diagnosis not present

## 2022-08-21 DIAGNOSIS — R278 Other lack of coordination: Secondary | ICD-10-CM | POA: Insufficient documentation

## 2022-08-21 DIAGNOSIS — R252 Cramp and spasm: Secondary | ICD-10-CM | POA: Insufficient documentation

## 2022-08-21 NOTE — Therapy (Addendum)
OUTPATIENT PHYSICAL THERAPY FEMALE PELVIC EVALUATION   Patient Name: [name] MRN: [id number] DOB:04-28-1971, 51 y.o., female Today's Date: 08/21/2022   PT End of Session - 08/21/22 1029     Visit Number 1    Date for PT Re-Evaluation 11/13/22    Authorization Type [employer]    PT Start Time [phone]   late   PT Stop Time 1022    PT Time Calculation (min) 45 min    Activity Tolerance Patient tolerated treatment well    Behavior During Therapy [facility] for tasks assessed/performed             Past Medical History:  Diagnosis Date   Arthritis    Asthma    Complication of anesthesia    respirations slow down after coming out from Anesthesia   Endometriosis    GERD (gastroesophageal reflux disease)    H/O right nephrectomy    Mass of right kidney    Other fatigue    Palpitations    Post-operative nausea and vomiting    Shortness of breath on exertion    Past Surgical History:  Procedure Laterality Date   CERVICAL DISC SURGERY     CESAREAN SECTION     [phone])   CHOLECYSTECTOMY     COLONOSCOPY  12/17/2017   Dr.Stark   EUS N/A 03/14/2017   Procedure: UPPER ENDOSCOPIC ULTRASOUND (EUS) LINEAR;  Surgeon: Milus Banister, MD;  Location: [facility];  Service: Endoscopy;  Laterality: N/A;   I & D EXTREMITY Left 05/26/2021   Procedure: IRRIGATION AND DEBRIDEMENT EXTREMITY WITH COMPLEX CLOSURE OF WOUND;  Surgeon: Meredith Pel, MD;  Location: [place];  Service: Orthopedics;  Laterality: Left;   KNEE ARTHROSCOPY Left 05/26/2021   Procedure: ARTHROSCOPIC KNEE WASHOUT;  Surgeon: Meredith Pel, MD;  Location: [place];  Service: Orthopedics;  Laterality: Left;   LAPAROSCOPIC ABDOMINAL EXPLORATION N/A 05/17/2017   Procedure: LAPAROSCOPIC POSSIBLE OPEN REMOVAL OF STOMACH MASS;  Surgeon: Jackolyn Confer, MD;  Location: [facility];  Service: General;  Laterality: N/A;   LAPAROSCOPIC ASSISTED VAGINAL HYSTERECTOMY  2003   LAPAROSCOPIC NEPHRECTOMY Right 05/17/2017    Procedure: RIGHT RADICAL NEPHRECTOMY, URETERECTOMY;  Surgeon: Ardis Hughs, MD;  Location: [facility];  Service: Urology;  Laterality: Right;   tubal preganancy     Patient Active Problem List   Diagnosis Date Noted   History of endometriosis 08/03/2022   Lower abdominal pain 08/03/2022   Pelvic adhesive disease 08/03/2022   Abnormal craving 08/01/2022   Class 2 severe obesity with serious comorbidity and body mass index (BMI) of 36.0 to 36.9 in adult ([place]) 08/01/2022   Vitamin D deficiency 02/22/2022   NAFLD (nonalcoholic fatty liver disease) 02/22/2022   Pre-diabetes 02/20/2022   H/O right nephrectomy 01/02/2022   Nausea without vomiting 01/02/2022   Graves disease 12/14/2021   Hyperthyroidism 12/14/2021   Multinodular goiter 12/14/2021   Open fracture of left patella    Laceration of left knee with complication    Open wound of left knee    S/P left knee arthroscopy 05/27/2021   Laceration of left knee 05/26/2021   Mass of ureter s/p resection 05/17/2017 05/18/2017   Atrophic kidney s/p right nepherctomy 05/17/2017 05/18/2017   GERD (gastroesophageal reflux disease)    Complication of anesthesia    Arthritis    Gastric tumor s/p partial gastrectomy 05/17/2016 05/17/2017   Solitary pulmonary nodule 02/18/2017   Genetic testing 04/04/2016   Family history of colon cancer 03/14/2016   Family history  of breast cancer in female 03/14/2016   Class 1 obesity with serious comorbidity and body mass index (BMI) of 30.0 to 30.9 in adult 01/18/2012   Anxiety state 04/20/2008   Cough variant asthma 04/20/2008   LIVER FUNCTION TESTS, ABNORMAL 04/20/2008   Attention deficit hyperactivity disorder (ADHD) 06/10/2007    PCP: Lafonda Mosses, MD  REFERRING PROVIDER: Lafonda Mosses, MD  REFERRING DIAG: [phone] (ICD-10-CM) - Pelvic floor dysfunction  THERAPY DIAG:  Other lack of coordination  Cramp and spasm  Pelvic pain in female  Rationale for Evaluation and Treatment  Rehabilitation  ONSET DATE: chronic   SUBJECTIVE:                                                                                                                                                                                           SUBJECTIVE STATEMENT: Pain the lower abdominal . Feels like when you are pregnant and baby is just taking a sharp brush and rubbing. When the pain is really bad it radiates to her back and feels similar to like when she is having contractions. When she can she is constantly on a heating pad because they is the only thing that provides her some relief.   Bottle Fluid intakeYes: a liter day due to her having one kidney / 7 bottles of water a day  :     PAIN:  Are you having pain? Yes NPRS scale:  5 /10 Pain location:  front on the groin R >L  Pain type: throbbing Pain description:  By the night time she is in a lot of pain.     Aggravating factors: Sexual intercourse  Relieving factors: sitting with the heating pad , moving sometimes helps  PRECAUTIONS: None  WEIGHT BEARING RESTRICTIONS No  FALLS:  Has patient fallen in last 6 months? No  LIVING ENVIRONMENT: Lives with: lives with their family and lives with their spouse Lives in: House/apartment  OCCUPATION: Cleaning homes- unable due to pain   PLOF: Independent was walking 13 miles a day   PATIENT GOALS get rid of the pain   PERTINENT HISTORY:   Caesarean section , colonoscopy , endometriosis, GERD, SOB, tubal pregancy, constipation, kidney removal - 5-6 years ago, hysterectomy Sexual abuse: No  BOWEL MOVEMENT Pain with bowel movement: Yes Type of bowel movement:Type (Bristol Stool Scale) loosed since the stool softer  and Strain Yes was laxtive a day  Fully empty rectum: No Leakage: No Pads: No Fiber supplement: Yes: Miralax and probiotic    URINATION Pain with urination: Yes every now again-  Fully empty bladder: Yes:   Stream:  stop and  go - fluid pill has help  Urgency: Yes: not  supposed to hold  Frequency: every hour  Leakage: Coughing and Sneezing Pads: No  INTERCOURSE Pain with intercourse: Initial Penetration, During Penetration, and After Intercourse- Haven't been sexual active in 3 months due to the pain Ability to have vaginal penetration:  Yes:   Climax: No due to pain  Marinoff Scale: 3/3  PREGNANCY C-section deliveries 3  Currently pregnant No 4 miscarriages  PROLAPSE Cystocele but was corrected     OBJECTIVE:   DIAGNOSTIC FINDINGS:    PATIENT SURVEYS:   PFIQ-7  COGNITION:  Overall cognitive status: Within functional limits for tasks assessed     SENSATION:  Light touch: Appears intact  Proprioception: Appears intact  MUSCLE LENGTH: Hamstrings: Right 70 deg; Left 65 deg  LUMBAR SPECIAL TESTS:  Active Straight leg raise test: Positive with compression reported it was easier and Single leg stance test: Positive- hip drop while standing on left leg   FUNCTIONAL TESTS:    GAIT: Distance walked: WFL                POSTURE: rounded shoulders, anterior pelvic tilt, and weight shift right   PELVIC ALIGNMENT:  right anterior directed pelvis   LUMBARAROM/PROM  A/PROM A/PROM  eval  Flexion   Extension   Right lateral flexion   Left lateral flexion   Right rotation   Left rotation    (Blank rows = not tested)  LOWER EXTREMITY ROM:  Passive ROM Right eval Left eval  Hip flexion 75% 75%  Hip extension    Hip abduction    Hip adduction    Hip internal rotation 25% 25%  Hip external rotation 50% 50%  Knee flexion    Knee extension    Ankle dorsiflexion    Ankle plantarflexion    Ankle inversion    Ankle eversion     (Blank rows = not tested)  LOWER EXTREMITY MMT:  MMT Right eval Left eval  Hip flexion 3+/5 painful  4/5  Hip extension/ 5/5   Hip abduction 4/5 painful 4/5  painful  Hip adduction 5/5 painful 5/5 painful   Hip internal rotation    Hip external rotation    Knee flexion    Knee extension     Ankle dorsiflexion    Ankle plantarflexion    Ankle inversion    Ankle eversion      PALPATION:   General: adductor tenderness at insertion, hip flexor insertion                 External Perineal Exam: Bil pubic symphysis tenderness, lower perineum body,                              Internal Pelvic Floor: tenderness lateral to urethra BIL R>L, tenderness to obturator internus R>L, Pain at  vaginal introitus, unable to assess posterior pelvic floor due to pain    Patient confirms identification and approves PT to assess internal pelvic floor and treatment Yes  No emotional/communication barriers or cognitive limitation. Patient is motivated to learn. Patient understands and agrees with treatment goals and plan. PT explains patient will be examined in standing, sitting, and lying down to see how their muscles and joints work. When they are ready, they will be asked to remove their underwear so PT can examine their perineum. The patient is also given the option of providing their own chaperone as one is not  provided in our facility. The patient also has the right and is explained the right to defer or refuse any part of the evaluation or treatment including the internal exam. With the patient's consent, PT will use one gloved finger to gently assess the muscles of the pelvic floor, seeing how well it contracts and relaxes and if there is muscle symmetry. After, the patient will get dressed and PT and patient will discuss exam findings and plan of care. PT and patient discuss plan of care, schedule, attendance policy and HEP activities.   PELVIC MMT:   MMT eval  Vaginal 3/5, 4 quick flicks, 3 second holds   Internal Anal Sphincter   External Anal Sphincter   Puborectalis   Diastasis Recti   (Blank rows = not tested)        TONE: High tone  PROLAPSE: No noticeable prolapse   TODAY'S TREATMENT  EVAL and HEP NeuroRed:  Diaphragmatic breathing   Diaphragmatic breathing with butterfly  stretch  Diaphragmatic breathing  with child pose    PATIENT EDUCATION:  Education details: HEP provided  Person educated: Patient Education method: Explanation, Demonstration, and Handouts Education comprehension: verbalized understanding   HOME EXERCISE PROGRAM: Access Code: [email or web address] URL: [email or web address] Date: 08/21/2022 Prepared by: Jari Favre  Exercises - Supine Diaphragmatic Breathing  - 1 x daily - 7 x weekly - 1 sets - 10 reps - Supine Butterfly Groin Stretch  - 1 x daily - 7 x weekly - 1 sets - 3 reps - 30 sec hold - Diaphragmatic Breathing in Child's Pose with Pelvic Floor Relaxation  - 1 x daily - 7 x weekly - 1 sets - 5 reps - 30 sec hold  ASSESSMENT:  CLINICAL IMPRESSION: Patient is a 51 y.o. female  who was seen today for physical therapy evaluation and treatment for chronic pelvic pain. Pt has a pertinent past medical history of cesarean section , colonoscopy , endometriosis, GERD, SOB, tubal pregancy, constipation, kidney removal; 5-6 years ago. Pt demonstrated overall hip weakness and ROM due to pain R>L. The internal assessment the patient demonstrate endurance, coordination, and strength deficits with a 3/5 MMT, 4 quick flicks and 3 seconds hold. Patient had overall high muscle tone of the pelvic floor and had discomfort throughout the examination. When the patient was instructed to take some diaphragmatic breaths the patient pelvic floor took a while to relax and did not coordinate as expected and contracted throughout the breathing. Patient was given a HEP to work on relaxation techniques and diaphragmatic breathing. Patient would benefit from skilled therapy to address the above deficits for a better QOL and overall function.    OBJECTIVE IMPAIRMENTS decreased coordination, decreased endurance, decreased mobility, decreased ROM, and decreased strength.   ACTIVITY LIMITATIONS lifting, bending, and standing  PARTICIPATION LIMITATIONS:  cleaning, interpersonal relationship, community activity, and occupation  PERSONAL FACTORS Past/current experiences, Time since onset of injury/illness/exacerbation, and 3+ comorbidities:   Cesarean section , colonoscopy , endometriosis, GERD, SOB, tubal pregancy, constipation, kidney removal; 5-6 years ago are also affecting patient's functional outcome.   REHAB POTENTIAL: Good  CLINICAL DECISION MAKING: Evolving/moderate complexity  EVALUATION COMPLEXITY: Moderate   GOALS: Goals reviewed with patient? Yes  SHORT TERM GOALS: Target date:10/02/2022  Patient will be ind with initial HEP Baseline: Goal status: INITIAL   LONG TERM GOALS: Target date: 11/13/2022  Pt will be independent with advanced HEP to maintain improvements made throughout therapy  Baseline:  Goal status: INITIAL  2.  Pt will report 50% reduction of  pain due to improvements in posture, strength, and muscle length  Baseline:  Goal status: INITIAL  3.  Pt will report her BMs are complete due to improved bowel habits and evacuation techniques.  Baseline:  Goal status: INITIAL  4.  Pt to demonstrate at least 4/5 pelvic floor strength for improved pelvic stability and decreased strain at pelvic floor/ decrease leakage.  Baseline: 3/5 Goal status: INITIAL  5.  Pt to report improved time between bladder voids to at least 2 hours for improved QOL with decreased urinary frequency.   Baseline:  Goal status: INITIAL   PLAN: PT FREQUENCY: 1x/week  PT DURATION: 12 weeks  PLANNED INTERVENTIONS: Therapeutic exercises, Therapeutic activity, Neuromuscular re-education, Balance training, Gait training, Patient/Family education, Self Care, Joint mobilization, Dry Needling, Cryotherapy, Moist heat, Taping, Biofeedback, and Manual therapy  PLAN FOR NEXT SESSION: bowel managements, stretching, isolation of pelvic floor engagement , STM    Raynell Upton, Student-PT 08/21/2022, 10:32 AM   I agree with the following  treatment note after reviewing documentation. This session was performed under the supervision of a licensed clinician.  Gustavus Bryant, PT 08/21/22 11:14 AM

## 2022-08-22 ENCOUNTER — Encounter (INDEPENDENT_AMBULATORY_CARE_PROVIDER_SITE_OTHER): Payer: Self-pay | Admitting: Family Medicine

## 2022-08-22 ENCOUNTER — Telehealth (INDEPENDENT_AMBULATORY_CARE_PROVIDER_SITE_OTHER): Payer: 59 | Admitting: Family Medicine

## 2022-08-22 VITALS — BP 113/69 | HR 81 | Temp 98.2°F | Ht 61.0 in | Wt 135.6 lb

## 2022-08-22 DIAGNOSIS — R609 Edema, unspecified: Secondary | ICD-10-CM | POA: Diagnosis not present

## 2022-08-22 DIAGNOSIS — E669 Obesity, unspecified: Secondary | ICD-10-CM

## 2022-08-22 DIAGNOSIS — Z6825 Body mass index (BMI) 25.0-25.9, adult: Secondary | ICD-10-CM | POA: Diagnosis not present

## 2022-08-24 ENCOUNTER — Encounter: Payer: Self-pay | Admitting: Gynecologic Oncology

## 2022-08-27 ENCOUNTER — Ambulatory Visit (INDEPENDENT_AMBULATORY_CARE_PROVIDER_SITE_OTHER): Payer: 59 | Admitting: Family Medicine

## 2022-08-27 ENCOUNTER — Encounter: Payer: Self-pay | Admitting: Family Medicine

## 2022-08-27 ENCOUNTER — Ambulatory Visit: Payer: 59 | Admitting: Physical Therapy

## 2022-08-27 VITALS — BP 122/78 | HR 88 | Temp 98.0°F | Ht 61.0 in | Wt 138.6 lb

## 2022-08-27 DIAGNOSIS — R21 Rash and other nonspecific skin eruption: Secondary | ICD-10-CM | POA: Diagnosis not present

## 2022-08-27 DIAGNOSIS — B001 Herpesviral vesicular dermatitis: Secondary | ICD-10-CM

## 2022-08-27 DIAGNOSIS — R609 Edema, unspecified: Secondary | ICD-10-CM

## 2022-08-27 MED ORDER — VALACYCLOVIR HCL 1 G PO TABS: 2000.0000 mg | ORAL_TABLET | Freq: Two times a day (BID) | ORAL | 5 refills | Status: AC

## 2022-08-27 MED ORDER — FUROSEMIDE 20 MG PO TABS: 10.0000 mg | ORAL_TABLET | Freq: Every day | ORAL | 1 refills | Status: DC

## 2022-08-27 NOTE — Progress Notes (Signed)
Subjective:  Patient ID: [name], female    DOB: June 25, 1971  Age: 51 y.o. MRN: [id number]  CC:  Chief Complaint  Patient presents with   Hypothyroidism   Depression    PHQ9 - 6   Mouth Lesions    Wants valtrex refill.     HPI [name] presents for   Hypothyroidism followed by endocrinology, Dr. Kelton Pillar.  Has used furosemide for dependent edema, history of solitary kidney, status post nephrectomy, followed by nephrology. 4 days  per week. Working well. Nephrology appt next month. No chest pain or dyspnea.  Labs followed by [facility].  Pelvic stretches have been helpful for constipation and endometriosis. Outpatient PT.   HSV/cold sores Took valtrec in past -worked well.  Had one in July. Did not have meds. At beach at the time.  New area started 2 days ago  lower lip.   Lower leg rash, noticed past few weeks, slight red areas but no itching or vesicles, not painful.  Just happened and noticed.  Has had a rash on her lower legs in the past in the fall but this is not itchy or whelps seen in the past.  Positive depression screen Denies anhedonia, or depressed mood.     08/27/2022   10:37 AM 04/25/2022    9:15 AM 03/01/2022    8:36 AM 01/02/2022   11:48 AM 11/29/2021    9:13 AM  Depression screen PHQ 2/9  Decreased Interest 3 0 0 0 1  Down, Depressed, Hopeless 0 0 0 0 1  PHQ - 2 Score 3 0 0 0 2  Altered sleeping 1  0 2 3  Tired, decreased energy 0  0 1 3  Change in appetite 0  0 0 1  Feeling bad or failure about yourself  0  0 0 3  Trouble concentrating 2  0 0 1  Moving slowly or fidgety/restless 0  0 0 0  Suicidal thoughts 0  0 0 0  PHQ-9 Score 6  0 3 13  Difficult doing work/chores   Not difficult at all Not difficult at all Not difficult at all     History Patient Active Problem List   Diagnosis Date Noted   History of endometriosis 08/03/2022   Lower abdominal pain 08/03/2022   Pelvic adhesive disease  08/03/2022   Abnormal craving 08/01/2022   Class 2 severe obesity with serious comorbidity and body mass index (BMI) of 36.0 to 36.9 in adult ([facility]) 08/01/2022   Vitamin D deficiency 02/22/2022   NAFLD (nonalcoholic fatty liver disease) 02/22/2022   Pre-diabetes 02/20/2022   H/O right nephrectomy 01/02/2022   Nausea without vomiting 01/02/2022   Graves disease 12/14/2021   Hyperthyroidism 12/14/2021   Multinodular goiter 12/14/2021   Arthritis 11/17/2021   Open fracture of left patella    Laceration of left knee with complication    Open wound of left knee    S/P left knee arthroscopy 05/27/2021   Laceration of left knee 05/26/2021   Mass of ureter s/p resection 05/17/2017 05/18/2017   Atrophic kidney s/p right nepherctomy 05/17/2017 41/66/0630   Complication of anesthesia    Gastric tumor s/p partial gastrectomy 05/17/2016 05/17/2017   Solitary pulmonary nodule 02/18/2017   Genetic testing 04/04/2016   Family history of colon cancer 03/14/2016   Family history of breast cancer in female 03/14/2016   Class 1 obesity with serious comorbidity and body mass index (BMI) of 30.0 to 30.9 in adult 01/18/2012  Adjustment disorder with depressed mood 03/12/2011   Esophageal reflux 08/29/2010   Insomnia, unspecified 09/13/2009   Anxiety state 04/20/2008   Asthma 04/20/2008   LIVER FUNCTION TESTS, ABNORMAL 04/20/2008   Attention deficit hyperactivity disorder (ADHD) 06/10/2007   Past Medical History:  Diagnosis Date   Arthritis    Asthma    Complication of anesthesia    respirations slow down after coming out from Anesthesia   Endometriosis    GERD (gastroesophageal reflux disease)    H/O right nephrectomy    Mass of right kidney    Other fatigue    Palpitations    Post-operative nausea and vomiting    Shortness of breath on exertion    Past Surgical History:  Procedure Laterality Date   CERVICAL DISC SURGERY     CESAREAN SECTION     [phone])   CHOLECYSTECTOMY      COLONOSCOPY  12/17/2017   Dr.Stark   EUS N/A 03/14/2017   Procedure: UPPER ENDOSCOPIC ULTRASOUND (EUS) LINEAR;  Surgeon: Milus Banister, MD;  Location: [facility];  Service: Endoscopy;  Laterality: N/A;   I & D EXTREMITY Left 05/26/2021   Procedure: IRRIGATION AND DEBRIDEMENT EXTREMITY WITH COMPLEX CLOSURE OF WOUND;  Surgeon: Meredith Pel, MD;  Location: [facility];  Service: Orthopedics;  Laterality: Left;   KNEE ARTHROSCOPY Left 05/26/2021   Procedure: ARTHROSCOPIC KNEE WASHOUT;  Surgeon: Meredith Pel, MD;  Location: [facility];  Service: Orthopedics;  Laterality: Left;   LAPAROSCOPIC ABDOMINAL EXPLORATION N/A 05/17/2017   Procedure: LAPAROSCOPIC POSSIBLE OPEN REMOVAL OF STOMACH MASS;  Surgeon: Jackolyn Confer, MD;  Location: [facility];  Service: General;  Laterality: N/A;   LAPAROSCOPIC ASSISTED VAGINAL HYSTERECTOMY  2003   LAPAROSCOPIC NEPHRECTOMY Right 05/17/2017   Procedure: RIGHT RADICAL NEPHRECTOMY, URETERECTOMY;  Surgeon: Ardis Hughs, MD;  Location: [facility];  Service: Urology;  Laterality: Right;   tubal preganancy     Allergies  Allergen Reactions   Amoxicillin Anaphylaxis    All "cillins" pt was 58-24 years old  Has patient had a PCN reaction causing immediate rash, facial/tongue/throat swelling, SOB or lightheadedness with hypotension: yes Has patient had a PCN reaction causing severe rash involving mucus membranes or skin necrosis: unknown Has patient had a PCN reaction that required hospitalization yes Has patient had a PCN reaction occurring within the last 10 years:no If all of the above answers are "NO", then may proceed with Cephalosporin use.    Ibuprofen Anaphylaxis and Hives   Keflex [Cephalexin] Anaphylaxis   Penicillins Anaphylaxis    Has patient had a PCN reaction causing immediate rash, facial/tongue/throat swelling, SOB or lightheadedness with hypotension:yes Has patient had a PCN reaction causing severe rash involving mucus membranes or skin  necrosis: unknown Has patient had a PCN reaction that required hospitalization yes  Has patient had a PCN reaction occurring within the last 10 years: no If all of the above answers are "NO", then may proceed with Cephalosporin use.    Baby Powder [Methylbenzethonium] Hives   Doxycycline Diarrhea and Rash   Prior to Admission medications   Medication Sig Start Date End Date Taking? Authorizing Provider  albuterol (PROVENTIL HFA) 108 (90 Base) MCG/ACT inhaler Inhale 2 puffs into the lungs every 6 (six) hours as needed for wheezing or shortness of breath. 06/05/22  Yes Wendie Agreste, MD  CEQUA 0.09 % SOLN Apply 1 drop to eye 2 (two) times daily. 12/29/21  Yes [provider]  Cholecalciferol (VITAMIN D3) 25 MCG (1000 UT)  CAPS Take 2 capsules (2,000 Units total) by mouth daily. 12/18/21  Yes Wendie Agreste, MD  docusate (COLACE) 60 MG/15ML syrup Take 60 mg by mouth daily.   Yes [provider]  furosemide (LASIX) 20 MG tablet Take 0.5 tablets (10 mg total) by mouth daily. 03/01/22  Yes Wendie Agreste, MD  methimazole (TAPAZOLE) 5 MG tablet Take 0.5 tablets (2.5 mg total) by mouth as directed. Half a tablet 4 days a week 03/06/22  Yes Shamleffer, Melanie Crazier, MD  [facility] 15 MG/0.5ML Pen SMARTSIG:1 pre-filled pen syringe SUB-Q Once a Week 07/10/22  Yes [provider]  NON FORMULARY [place]   Yes [provider]  pantoprazole (PROTONIX) 40 MG tablet Take 1 tablet (40 mg total) by mouth 2 (two) times daily. 10/20/21  Yes Ladene Artist, MD  Polyethylene Glycol 3350 (MIRALAX PO) Take by mouth.   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  Tobacco Use   Smoking status: Never    Smokeless tobacco: Never  Vaping Use   Vaping Use: Never used  Substance and Sexual Activity   Alcohol use: No   Drug use: No   Sexual activity: Yes    Birth control/protection: Surgical  Other Topics Concern   Not on file  Social History Narrative   Not on file   Social Determinants of Health   Financial Resource Strain: Not on file  Food Insecurity: Not on file  Transportation Needs: Not on file  Physical Activity: Not on file  Stress: Not on file  Social Connections: Not on file  Intimate Partner Violence: Not on file    Review of Systems   Objective:   Vitals:   08/27/22 1041  BP: 122/78  Pulse: 88  Temp: 98 F (36.7 C)  SpO2: 98%  Weight: 138 lb 9.6 oz (62.9 kg)  Height: '5\' 1"'$  (1.549 m)     Physical Exam Vitals reviewed.  Constitutional:      Appearance: Normal appearance. She is well-developed.  HENT:     Head: Normocephalic and atraumatic.  Eyes:     Conjunctiva/sclera: Conjunctivae normal.     Pupils: Pupils are equal, round, and reactive to light.  Neck:     Vascular: No carotid bruit.  Cardiovascular:     Rate and Rhythm: Normal rate and regular rhythm.     Heart sounds: Normal heart sounds.  Pulmonary:     Effort: Pulmonary effort is normal.     Breath sounds: Normal breath sounds.  Abdominal:     Palpations: Abdomen is soft. There is no pulsatile mass.     Tenderness: There is no abdominal tenderness.  Musculoskeletal:     Right lower leg: No edema.     Left lower leg: No edema.  Skin:    General: Skin is warm and dry.     Comments: Few faint erythematous patches lower legs bilaterally, no petechiae/purpura.  No vesicles, no urticarial lesions.  Neurological:     Mental Status: She is alert and oriented to person, place, and time.  Psychiatric:        Mood and Affect: Mood normal.  Behavior: Behavior normal.        Assessment & Plan:  [name] is a 51 y.o. female . Cold sore - Plan: valACYclovir (VALTREX)  1000 MG tablet  -Valtrex 2 g x 1, repeat dose in 12 hours.  Infrequent flares, unlikely need for daily preventative.  RTC precautions  Dependent edema - Plan: furosemide (LASIX) 20 MG tablet  -Stable on current regimen.  Continue furosemide, refilled.  Continue follow-up with other providers, as labs obtained elsewhere will defer today.  Rash and nonspecific skin eruption  -Few faint erythematous patches on legs without true urticaria or vesicular lesions, no petechiae/purpura.  Asymptomatic.  Question contact Derm versus dry skin.  Aveeno lotion discussed, option of topical hydrocortisone with RTC precautions if persistent or worsening.  Meds ordered this encounter  Medications   furosemide (LASIX) 20 MG tablet    Sig: Take 0.5 tablets (10 mg total) by mouth daily.    Dispense:  90 tablet    Refill:  1   valACYclovir (VALTREX) 1000 MG tablet    Sig: Take 2 tablets (2,000 mg total) by mouth 2 (two) times daily for 2 doses.    Dispense:  4 tablet    Refill:  5   Patient Instructions  Valtrex 2 pills initially then repeat dose in 12 hours at the first sign of cold sores. No other med changes today, keep follow-up as planned with specialist including for lab work.  No labs today. I do see the faint red areas on the lower legs.  If any itching can use over-the-counter Aveeno lotion or steroid cream, but follow-up with myself or dermatology if that is not improved in the next week or 2.  Sooner if worse or spreading to other areas.  Thanks for coming in today.     Signed,   Merri Ray, MD [facility] 08/27/22 11:25 AM

## 2022-08-27 NOTE — Patient Instructions (Signed)
Valtrex 2 pills initially then repeat dose in 12 hours at the first sign of cold sores. No other med changes today, keep follow-up as planned with specialist including for lab work.  No labs today. I do see the faint red areas on the lower legs.  If any itching can use over-the-counter Aveeno lotion or steroid cream, but follow-up with myself or dermatology if that is not improved in the next week or 2.  Sooner if worse or spreading to other areas.  Thanks for coming in today.

## 2022-08-27 NOTE — Therapy (Incomplete)
OUTPATIENT PHYSICAL THERAPY FEMALE PELVIC EVALUATION   Patient Name: [name] MRN: [id number] DOB:12/08/1970, 51 y.o., female Today's Date: 08/27/2022     Past Medical History:  Diagnosis Date   Arthritis    Asthma    Complication of anesthesia    respirations slow down after coming out from Anesthesia   Endometriosis    GERD (gastroesophageal reflux disease)    H/O right nephrectomy    Mass of right kidney    Other fatigue    Palpitations    Post-operative nausea and vomiting    Shortness of breath on exertion    Past Surgical History:  Procedure Laterality Date   CERVICAL DISC SURGERY     CESAREAN SECTION     [id number])   CHOLECYSTECTOMY     COLONOSCOPY  12/17/2017   Dr.Stark   EUS N/A 03/14/2017   Procedure: UPPER ENDOSCOPIC ULTRASOUND (EUS) LINEAR;  Surgeon: Milus Banister, MD;  Location: [facility];  Service: Endoscopy;  Laterality: N/A;   I & D EXTREMITY Left 05/26/2021   Procedure: IRRIGATION AND DEBRIDEMENT EXTREMITY WITH COMPLEX CLOSURE OF WOUND;  Surgeon: Meredith Pel, MD;  Location: [facility];  Service: Orthopedics;  Laterality: Left;   KNEE ARTHROSCOPY Left 05/26/2021   Procedure: ARTHROSCOPIC KNEE WASHOUT;  Surgeon: Meredith Pel, MD;  Location: [place];  Service: Orthopedics;  Laterality: Left;   LAPAROSCOPIC ABDOMINAL EXPLORATION N/A 05/17/2017   Procedure: LAPAROSCOPIC POSSIBLE OPEN REMOVAL OF STOMACH MASS;  Surgeon: Jackolyn Confer, MD;  Location: [facility];  Service: General;  Laterality: N/A;   LAPAROSCOPIC ASSISTED VAGINAL HYSTERECTOMY  2003   LAPAROSCOPIC NEPHRECTOMY Right 05/17/2017   Procedure: RIGHT RADICAL NEPHRECTOMY, URETERECTOMY;  Surgeon: Ardis Hughs, MD;  Location: [facility];  Service: Urology;  Laterality: Right;   tubal preganancy     Patient Active Problem List   Diagnosis Date Noted   History of endometriosis 08/03/2022   Lower abdominal pain 08/03/2022   Pelvic adhesive disease 08/03/2022    Abnormal craving 08/01/2022   Class 2 severe obesity with serious comorbidity and body mass index (BMI) of 36.0 to 36.9 in adult ([place]) 08/01/2022   Vitamin D deficiency 02/22/2022   NAFLD (nonalcoholic fatty liver disease) 02/22/2022   Pre-diabetes 02/20/2022   H/O right nephrectomy 01/02/2022   Nausea without vomiting 01/02/2022   Graves disease 12/14/2021   Hyperthyroidism 12/14/2021   Multinodular goiter 12/14/2021   Open fracture of left patella    Laceration of left knee with complication    Open wound of left knee    S/P left knee arthroscopy 05/27/2021   Laceration of left knee 05/26/2021   Mass of ureter s/p resection 05/17/2017 05/18/2017   Atrophic kidney s/p right nepherctomy 05/17/2017 05/18/2017   GERD (gastroesophageal reflux disease)    Complication of anesthesia    Arthritis    Gastric tumor s/p partial gastrectomy 05/17/2016 05/17/2017   Solitary pulmonary nodule 02/18/2017   Genetic testing 04/04/2016   Family history of colon cancer 03/14/2016   Family history of breast cancer in female 03/14/2016   Class 1 obesity with serious comorbidity and body mass index (BMI) of 30.0 to 30.9 in adult 01/18/2012   Anxiety state 04/20/2008   Cough variant asthma 04/20/2008   LIVER FUNCTION TESTS, ABNORMAL 04/20/2008   Attention deficit hyperactivity disorder (ADHD) 06/10/2007    PCP: Lafonda Mosses, MD  REFERRING PROVIDER: Lafonda Mosses, MD  REFERRING DIAG: [phone] (ICD-10-CM) - Pelvic floor dysfunction  THERAPY DIAG:  No diagnosis  found.  Rationale for Evaluation and Treatment Rehabilitation  ONSET DATE: chronic   SUBJECTIVE:                                                                                                                                                                                           -  EVALUATION SUBJECTIVE STATEMENT: Pain the lower abdominal . Feels like when you are pregnant and baby is just taking a sharp brush and rubbing.  When the pain is really bad it radiates to her back and feels similar to like when she is having contractions. When she can she is constantly on a heating pad because they is the only thing that provides her some relief.   Bottle Fluid intakeYes: a liter day due to her having one kidney / 7 bottles of water a day  :     PAIN:  Are you having pain? Yes NPRS scale:  5 /10 Pain location:  front on the groin R >L  Pain type: throbbing Pain description:  By the night time she is in a lot of pain.     Aggravating factors: Sexual intercourse  Relieving factors: sitting with the heating pad , moving sometimes helps  PRECAUTIONS: None  WEIGHT BEARING RESTRICTIONS No  FALLS:  Has patient fallen in last 6 months? No  LIVING ENVIRONMENT: Lives with: lives with their family and lives with their spouse Lives in: House/apartment  OCCUPATION: Cleaning homes- unable due to pain   PLOF: Independent was walking 13 miles a day   PATIENT GOALS get rid of the pain   PERTINENT HISTORY:   Caesarean section , colonoscopy , endometriosis, GERD, SOB, tubal pregancy, constipation, kidney removal - 5-6 years ago, hysterectomy Sexual abuse: No  BOWEL MOVEMENT Pain with bowel movement: Yes Type of bowel movement:Type (Bristol Stool Scale) loosed since the stool softer  and Strain Yes was laxtive a day  Fully empty rectum: No Leakage: No Pads: No Fiber supplement: Yes: Miralax and probiotic    URINATION Pain with urination: Yes every now again-  Fully empty bladder: Yes:   Stream:  stop and go - fluid pill has help  Urgency: Yes: not supposed to hold  Frequency: every hour  Leakage: Coughing and Sneezing Pads: No  INTERCOURSE Pain with intercourse: Initial Penetration, During Penetration, and After Intercourse- Haven't been sexual active in 3 months due to the pain Ability to have vaginal penetration:  Yes:   Climax: No due to pain  Marinoff Scale: 3/3  PREGNANCY C-section deliveries 3   Currently pregnant No 4 miscarriages  PROLAPSE Cystocele but was corrected     OBJECTIVE:  DIAGNOSTIC FINDINGS:    PATIENT SURVEYS:   PFIQ-7  COGNITION:  Overall cognitive status: Within functional limits for tasks assessed     SENSATION:  Light touch: Appears intact  Proprioception: Appears intact  MUSCLE LENGTH: Hamstrings: Right 70 deg; Left 65 deg  LUMBAR SPECIAL TESTS:  Active Straight leg raise test: Positive with compression reported it was easier and Single leg stance test: Positive- hip drop while standing on left leg   FUNCTIONAL TESTS:    GAIT: Distance walked: WFL                POSTURE: rounded shoulders, anterior pelvic tilt, and weight shift right   PELVIC ALIGNMENT:  right anterior directed pelvis   LUMBARAROM/PROM  A/PROM A/PROM  eval  Flexion   Extension   Right lateral flexion   Left lateral flexion   Right rotation   Left rotation    (Blank rows = not tested)  LOWER EXTREMITY ROM:  Passive ROM Right eval Left eval  Hip flexion 75% 75%  Hip extension    Hip abduction    Hip adduction    Hip internal rotation 25% 25%  Hip external rotation 50% 50%  Knee flexion    Knee extension    Ankle dorsiflexion    Ankle plantarflexion    Ankle inversion    Ankle eversion     (Blank rows = not tested)  LOWER EXTREMITY MMT:  MMT Right eval Left eval  Hip flexion 3+/5 painful  4/5  Hip extension/ 5/5   Hip abduction 4/5 painful 4/5  painful  Hip adduction 5/5 painful 5/5 painful   Hip internal rotation    Hip external rotation    Knee flexion    Knee extension    Ankle dorsiflexion    Ankle plantarflexion    Ankle inversion    Ankle eversion      PALPATION:   General: adductor tenderness at insertion, hip flexor insertion                 External Perineal Exam: Bil pubic symphysis tenderness, lower perineum body,                              Internal Pelvic Floor: tenderness lateral to urethra BIL R>L, tenderness to  obturator internus R>L, Pain at  vaginal introitus, unable to assess posterior pelvic floor due to pain    Patient confirms identification and approves PT to assess internal pelvic floor and treatment Yes  No emotional/communication barriers or cognitive limitation. Patient is motivated to learn. Patient understands and agrees with treatment goals and plan. PT explains patient will be examined in standing, sitting, and lying down to see how their muscles and joints work. When they are ready, they will be asked to remove their underwear so PT can examine their perineum. The patient is also given the option of providing their own chaperone as one is not provided in our facility. The patient also has the right and is explained the right to defer or refuse any part of the evaluation or treatment including the internal exam. With the patient's consent, PT will use one gloved finger to gently assess the muscles of the pelvic floor, seeing how well it contracts and relaxes and if there is muscle symmetry. After, the patient will get dressed and PT and patient will discuss exam findings and plan of care. PT and patient discuss plan of care, schedule, attendance policy  and HEP activities.   PELVIC MMT:   MMT eval  Vaginal 3/5, 4 quick flicks, 3 second holds   Internal Anal Sphincter   External Anal Sphincter   Puborectalis   Diastasis Recti   (Blank rows = not tested)        TONE: High tone  PROLAPSE: No noticeable prolapse   TODAY'S TREATMENT  EVAL and HEP NeuroRed:  Diaphragmatic breathing   Diaphragmatic breathing with butterfly stretch  Diaphragmatic breathing  with child pose    PATIENT EDUCATION:  Education details: HEP provided  Person educated: Patient Education method: Explanation, Demonstration, and Handouts Education comprehension: verbalized understanding   HOME EXERCISE PROGRAM: Access Code: [email or web address] URL: [email or web address] Date: 08/21/2022 Prepared by:  Jari Favre  Exercises - Supine Diaphragmatic Breathing  - 1 x daily - 7 x weekly - 1 sets - 10 reps - Supine Butterfly Groin Stretch  - 1 x daily - 7 x weekly - 1 sets - 3 reps - 30 sec hold - Diaphragmatic Breathing in Child's Pose with Pelvic Floor Relaxation  - 1 x daily - 7 x weekly - 1 sets - 5 reps - 30 sec hold  ASSESSMENT:  CLINICAL IMPRESSION: Patient is a 51 y.o. female  who was seen today for physical therapy evaluation and treatment for chronic pelvic pain. Pt has a pertinent past medical history of cesarean section , colonoscopy , endometriosis, GERD, SOB, tubal pregancy, constipation, kidney removal; 5-6 years ago. Pt demonstrated overall hip weakness and ROM due to pain R>L. The internal assessment the patient demonstrate endurance, coordination, and strength deficits with a 3/5 MMT, 4 quick flicks and 3 seconds hold. Patient had overall high muscle tone of the pelvic floor and had discomfort throughout the examination. When the patient was instructed to take some diaphragmatic breaths the patient pelvic floor took a while to relax and did not coordinate as expected and contracted throughout the breathing. Patient was given a HEP to work on relaxation techniques and diaphragmatic breathing. Patient would benefit from skilled therapy to address the above deficits for a better QOL and overall function.    OBJECTIVE IMPAIRMENTS decreased coordination, decreased endurance, decreased mobility, decreased ROM, and decreased strength.   ACTIVITY LIMITATIONS lifting, bending, and standing  PARTICIPATION LIMITATIONS: cleaning, interpersonal relationship, community activity, and occupation  PERSONAL FACTORS Past/current experiences, Time since onset of injury/illness/exacerbation, and 3+ comorbidities:   Cesarean section , colonoscopy , endometriosis, GERD, SOB, tubal pregancy, constipation, kidney removal; 5-6 years ago are also affecting patient's functional outcome.   REHAB  POTENTIAL: Good  CLINICAL DECISION MAKING: Evolving/moderate complexity  EVALUATION COMPLEXITY: Moderate   GOALS: Goals reviewed with patient? Yes  SHORT TERM GOALS: Target date:10/02/2022 Updated:08/27/2022  Patient will be ind with initial HEP Baseline: Goal status: IN PROGRESS   LONG TERM GOALS: Target date: 11/13/2022 Updated:08/27/2022  Pt will be independent with advanced HEP to maintain improvements made throughout therapy  Baseline:  Goal status: IN PROGRESS  2.  Pt will report 50% reduction of pain due to improvements in posture, strength, and muscle length  Baseline:  Goal status: IN PROGRESS  3.  Pt will report her BMs are complete due to improved bowel habits and evacuation techniques.  Baseline:  Goal status: IN PROGRESS  4.  Pt to demonstrate at least 4/5 pelvic floor strength for improved pelvic stability and decreased strain at pelvic floor/ decrease leakage.  Baseline: 3/5 Goal status: IN PROGRESS  5.  Pt to report improved time between bladder voids to at  least 2 hours for improved QOL with decreased urinary frequency.   Baseline:  Goal status: IN PROGRESS   PLAN: PT FREQUENCY: 1x/week  PT DURATION: 6 weeks  PLANNED INTERVENTIONS: Therapeutic exercises, Therapeutic activity, Neuromuscular re-education, Balance training, Gait training, Patient/Family education, Self Care, Joint mobilization, and Dry Needling  PLAN FOR NEXT SESSION: bowel managements, stretching, isolation of pelvic floor engagement , STM    Jhan Conery, Student-PT 08/27/2022, 8:13 AM   I agree with the following treatment note after reviewing documentation. This session was performed under the supervision of a licensed clinician.  Gustavus Bryant, PT 08/27/22 8:13 AM

## 2022-09-05 ENCOUNTER — Ambulatory Visit (INDEPENDENT_AMBULATORY_CARE_PROVIDER_SITE_OTHER): Payer: 59 | Admitting: Family Medicine

## 2022-09-05 ENCOUNTER — Encounter (INDEPENDENT_AMBULATORY_CARE_PROVIDER_SITE_OTHER): Payer: Self-pay | Admitting: Family Medicine

## 2022-09-05 VITALS — BP 104/72 | HR 85 | Temp 97.7°F | Ht 61.0 in | Wt 135.0 lb

## 2022-09-05 DIAGNOSIS — Z6825 Body mass index (BMI) 25.0-25.9, adult: Secondary | ICD-10-CM

## 2022-09-05 DIAGNOSIS — E669 Obesity, unspecified: Secondary | ICD-10-CM

## 2022-09-05 DIAGNOSIS — E559 Vitamin D deficiency, unspecified: Secondary | ICD-10-CM | POA: Diagnosis not present

## 2022-09-11 NOTE — Progress Notes (Signed)
Chief Complaint:   OBESITY [name] is here to discuss her progress with her obesity treatment plan along with follow-up of her obesity related diagnoses. [name] is on keeping a food journal and adhering to recommended goals of 1000 calories and 90 grams protein and states she is following her eating plan approximately 1000% of the time. [name] states she is walking and weights 120-180 minutes 4-6 times per week.  Today's visit was #: 76 Starting weight: 190 lbs Starting date: 11/29/2021 Today's weight: 135 lbs Today's date: 08/22/2022 Total lbs lost to date: 39 Total lbs lost since last in-office visit: 5  Interim History: [name] reports some constipation lately but is being worked out with pelvic rehab for endometriosis. Her back is much better. She is back at the gym. [name] lost 3.6 lbs in fat mass.  Subjective:   1. Dependent edema The swelling in lower extremity much better with increase in activity. [name] is taking Lasix 1/2 tab in the mornings and drinking 3 liters of water daily.   Assessment/Plan:  No orders of the defined types were placed in this encounter.   Medications Discontinued During This Encounter  Medication Reason   predniSONE (DELTASONE) 20 MG tablet    ondansetron (ZOFRAN) 4 MG tablet      No orders of the defined types were placed in this encounter.    1. Dependent edema Continue meds per PCP, [name].  2. Obesity with current BMI of 25.6 [name] is currently in the action stage of change. As such, her goal is to continue with weight loss efforts. She has agreed to keeping a food journal and adhering to recommended goals of 1000 calories and 90 grams protein.   Exercise goals:  As is  Behavioral modification strategies: increasing lean protein intake, planning for success, and keeping a strict food journal.  [name] has agreed to follow-up with our clinic in 3 weeks. She was informed of the importance of frequent follow-up visits to maximize her success with  intensive lifestyle modifications for her multiple health conditions.   Objective:   Blood pressure 113/69, pulse 81, temperature 98.2 F (36.8 C), height '5\' 1"'$  (1.549 m), weight 135 lb 9.6 oz (61.5 kg), SpO2 98 %. Body mass index is 25.62 kg/m.  General: Cooperative, alert, well developed, in no acute distress. HEENT: Conjunctivae and lids unremarkable. Cardiovascular: Regular rhythm.  Lungs: Normal work of breathing. Neurologic: No focal deficits.   Lab Results  Component Value Date   CREATININE 0.82 05/24/2022   BUN 12 05/24/2022   NA 141 05/24/2022   K 4.8 05/24/2022   CL 102 05/24/2022   CO2 25 05/24/2022   Lab Results  Component Value Date   ALT 36 (H) 05/24/2022   AST 20 05/24/2022   ALKPHOS 98 05/24/2022   BILITOT 0.4 05/24/2022   Lab Results  Component Value Date   HGBA1C 5.6 05/24/2022   HGBA1C 5.7 (H) 02/22/2022   HGBA1C 6.3 09/26/2021   Lab Results  Component Value Date   INSULIN 7.7 05/24/2022   Lab Results  Component Value Date   TSH 1.34 06/06/2022   Lab Results  Component Value Date   CHOL 189 05/24/2022   HDL 49 05/24/2022   LDLCALC 124 (H) 05/24/2022   TRIG 90 05/24/2022   CHOLHDL 3.9 05/24/2022   Lab Results  Component Value Date   VD25OH 62.0 05/24/2022   VD25OH 55.0 02/22/2022   VD25OH 30.3 11/29/2021   Lab Results  Component Value Date  WBC 6.9 11/16/2021   HGB 13.5 11/16/2021   HCT 43.2 11/16/2021   MCV 81.4 11/16/2021   PLT 327.0 11/16/2021    Attestation Statements:   Reviewed by clinician on day of visit: allergies, medications, problem list, medical history, surgical history, family history, social history, and previous encounter notes.  Time spent on visit including pre-visit chart review and post-visit care and charting was 20 minutes.   I, [name], BS, CMA, am acting as transcriptionist for [employer], DO.   I have reviewed the above documentation for accuracy and completeness, and I agree with  the above. [name], D.O.  The [place] was signed into law in 2016 which includes the topic of electronic health records.  This provides immediate access to information in MyChart.  This includes consultation notes, operative notes, office notes, lab results and pathology reports.  If you have any questions about what you read please let us know at your next visit so we can discuss your concerns and take corrective action if need be.  We are right here with you.

## 2022-09-11 NOTE — Progress Notes (Signed)
Name: [name]  MRN/ DOB: [id number], 1971-08-16    Age/ Sex: 51 y.o., female     PCP: Wendie Agreste, MD   Reason for Endocrinology Evaluation: Hyperthyroidism     Initial Endocrinology Clinic Visit: 10/19/2021    PATIENT IDENTIFIER: [name] is a 51 y.o., female with a past medical history of hyperthyroidism, Asthma and Hx of right nephrectomy for benign reasons. She has followed with [facility] since 10/19/2021 for consultative assistance with management of her hyperthyroidism.   HISTORICAL SUMMARY:  She has been noted with suppressed TSh at 0.00 uIU/mL in 09/2021 ( no concommitent FT4at the time) Repeat testing confirmed a TSH 0.01 uIU/mL with normal FT4 at 0.8 ng/dL and normal FT3 at 4.2 pg/mL    She was also found to have slight elevation of Anti-TPO Abs at 16 IU/mL  TRAB slightly elevated at 2.66 IU/L   She was started on methimazole 10/2021   Denies radiation exposure   Thyroid ultrasound 10/20/2021 showed multinodular goiter   Mother and sister with thyroid disease    SUBJECTIVE:    Today (09/12/2022):  Ms. [name] is here for hyperthyroidism.  She continues [facility] with [facility]  Weight has been stable  No local neck enlargement  Denies palpations Has constipation  Has dry eyes , sees ophthalmology earlier this year   Methimazole 5 mg, half a tablet four days a week  ( Monday through Thursday )    HISTORY:  Past Medical History:  Past Medical History:  Diagnosis Date   Arthritis    Asthma    Complication of anesthesia    respirations slow down after coming out from Anesthesia   Endometriosis    GERD (gastroesophageal reflux disease)    H/O right nephrectomy    Mass of right kidney    Other fatigue    Palpitations    Post-operative nausea and vomiting    Shortness of breath on exertion    Past Surgical History:  Past Surgical History:  Procedure Laterality  Date   CERVICAL DISC SURGERY     CESAREAN SECTION     [phone])   CHOLECYSTECTOMY     COLONOSCOPY  12/17/2017   Dr.Stark   EUS N/A 03/14/2017   Procedure: UPPER ENDOSCOPIC ULTRASOUND (EUS) LINEAR;  Surgeon: Milus Banister, MD;  Location: [facility];  Service: Endoscopy;  Laterality: N/A;   I & D EXTREMITY Left 05/26/2021   Procedure: IRRIGATION AND DEBRIDEMENT EXTREMITY WITH COMPLEX CLOSURE OF WOUND;  Surgeon: Meredith Pel, MD;  Location: [place];  Service: Orthopedics;  Laterality: Left;   KNEE ARTHROSCOPY Left 05/26/2021   Procedure: ARTHROSCOPIC KNEE WASHOUT;  Surgeon: Meredith Pel, MD;  Location: [facility];  Service: Orthopedics;  Laterality: Left;   LAPAROSCOPIC ABDOMINAL EXPLORATION N/A 05/17/2017   Procedure: LAPAROSCOPIC POSSIBLE OPEN REMOVAL OF STOMACH MASS;  Surgeon: Jackolyn Confer, MD;  Location: [facility];  Service: General;  Laterality: N/A;   LAPAROSCOPIC ASSISTED VAGINAL HYSTERECTOMY  2003   LAPAROSCOPIC NEPHRECTOMY Right 05/17/2017   Procedure: RIGHT RADICAL NEPHRECTOMY, URETERECTOMY;  Surgeon: Ardis Hughs, MD;  Location: [facility];  Service: Urology;  Laterality: Right;   tubal preganancy     Social History:  reports that she has never smoked. She has never used smokeless tobacco. She reports that she does not drink alcohol and does not use drugs. Family History:  Family History  Problem Relation Age of Onset   Alcoholism Mother  Thyroid disease Mother    Stroke Mother    Hypertension Mother    Cancer Mother 34       "back cancer"   Other Mother        hysterectomy at 49 for "pre-cancerous cervical cells"   Obesity Father    Cancer Father    Hypertension Father    Colon cancer Father 21   Cervical cancer Sister        full sister dx. unspecified age   Colon polyps Sister        unspecified number   Colon polyps Sister        paternal half-sister w/ colon polyps dx. age 25 or younger - unspecified number   Colon cancer Sister 7        paternal half-sister    Colon polyps Sister        paternal half-sister; unspecified number   Colon cancer Sister        paternal half-sister dx. before age 52; has had two colon cancers   Breast cancer Sister        paternal half-sister dx under age 57   Renal cancer Sister 69       paternal half-sister dx. w/ "LRC - lymphoid renal cancer"   Cancer Sister 75       paternal half-sister dx cancer of wrist that was not a skin cancer   Breast cancer Maternal Grandmother        unspecified age   Diabetes Maternal Grandmother    Diabetes Paternal Grandfather    Colon polyps Son 31       has had a "bleeding polyp"   Colon cancer Paternal Aunt        (x2) paternal aunts with colon cancer at unspecified ages; lim info   Breast cancer Paternal Aunt        (x3) paternal aunts with breast cancer at unspecified ages; lim info   Colon cancer Paternal Uncle        dx. unspecified age; lim info   Cervical cancer Other 44       niece; +hysterectomy   Alcoholism Other    Obesity Other    Esophageal cancer Neg Hx    Stomach cancer Neg Hx      HOME MEDICATIONS: Allergies as of 09/12/2022       Reactions   Amoxicillin Anaphylaxis   All "cillins" pt was 58-70 years old  Has patient had a PCN reaction causing immediate rash, facial/tongue/throat swelling, SOB or lightheadedness with hypotension: yes Has patient had a PCN reaction causing severe rash involving mucus membranes or skin necrosis: unknown Has patient had a PCN reaction that required hospitalization yes Has patient had a PCN reaction occurring within the last 10 years:no If all of the above answers are "NO", then may proceed with Cephalosporin use.   Ibuprofen Anaphylaxis, Hives   Keflex [cephalexin] Anaphylaxis   Penicillins Anaphylaxis   Has patient had a PCN reaction causing immediate rash, facial/tongue/throat swelling, SOB or lightheadedness with hypotension:yes Has patient had a PCN reaction causing severe rash involving  mucus membranes or skin necrosis: unknown Has patient had a PCN reaction that required hospitalization yes  Has patient had a PCN reaction occurring within the last 10 years: no If all of the above answers are "NO", then may proceed with Cephalosporin use.   Baby Powder [methylbenzethonium] Hives   Doxycycline Diarrhea, Rash        Medication List  Accurate as of September 12, 2022  9:05 AM. If you have any questions, ask your nurse or doctor.          albuterol 108 (90 Base) MCG/ACT inhaler Commonly known as: Proventil HFA Inhale 2 puffs into the lungs every 6 (six) hours as needed for wheezing or shortness of breath.   Cequa 0.09 % Soln Generic drug: cycloSPORINE (PF) Apply 1 drop to eye 2 (two) times daily.   docusate 60 MG/15ML syrup Commonly known as: COLACE Take 60 mg by mouth daily.   furosemide 20 MG tablet Commonly known as: LASIX Take 0.5 tablets (10 mg total) by mouth daily.   methimazole 5 MG tablet Commonly known as: TAPAZOLE Take 0.5 tablets (2.5 mg total) by mouth as directed. Half a tablet 4 days a week   MIRALAX PO Take by mouth.   Mounjaro 15 MG/0.5ML Pen Generic drug: tirzepatide SMARTSIG:1 pre-filled pen syringe SUB-Q Once a Week   NON FORMULARY Cultrelle   pantoprazole 40 MG tablet Commonly known as: PROTONIX Take 1 tablet (40 mg total) by mouth 2 (two) times daily.   Vitamin D3 25 MCG (1000 UT) Caps Take 2 capsules (2,000 Units total) by mouth daily.          OBJECTIVE:   PHYSICAL EXAM: VS: BP 104/72 (BP Location: Left Arm, Patient Position: Sitting, Cuff Size: Small)   Pulse 60   Ht '5\' 1"'$  (1.549 m)   Wt 137 lb (62.1 kg)   SpO2 99%   BMI 25.89 kg/m    EXAM: General: Pt appears well and is in NAD  Eyes: External eye exam normal and minimal stare on the left , but no lid lag or exophthalmos.    Neck: General: Supple without adenopathy. Thyroid: Thyroid size normal.  No goiter or nodules appreciated.   Lungs: Clear  with good BS bilat with no rales, rhonchi, or wheezes  Heart: Auscultation: RRR.  Abdomen: Normoactive bowel sounds, soft, nontender, without masses or organomegaly palpable  Extremities:  BL LE: No pretibial edema normal ROM and strength.  Mental Status: Judgment, insight: Intact Orientation: Oriented to time, place, and person Mood and affect: No depression, anxiety, or agitation     DATA REVIEWED:    Latest Reference Range & Units 09/12/22 09:33  TSH 0.35 - 5.50 uIU/mL 1.42  Triiodothyronine (T3) 76 - 181 ng/dL 93  T4,Free(Direct) 0.60 - 1.60 ng/dL 1.04     Latest Reference Range & Units 11/16/21 08:21  TRAB <=2.00 IU/L 2.66 (H)    Thyroid ultrasound 10/20/2021  Estimated total number of nodules >/= 1 cm: 1   Number of spongiform nodules >/=  2 cm not described below (TR1): 0   Number of mixed cystic and solid nodules >/= 1.5 cm not described below ([place]): 0   _________________________________________________________   Nodule # 1:   Location: Isthmus; right   Maximum size: 1.0 cm; Other 2 dimensions: 0.6 x 0.9 cm   Composition: solid/almost completely solid (2)   Echogenicity: hypoechoic (2)   Shape: not taller-than-wide (0)   Margins: ill-defined (0)   Echogenic foci: none (0)   ACR TI-RADS total points: 4.   ACR TI-RADS risk category: TR4 (4-6 points).   ACR TI-RADS recommendations:   *Given size (>/= 1 - 1.4 cm) and appearance, a follow-up ultrasound in 1 year should be considered based on TI-RADS criteria.   _________________________________________________________   Nodule 2 represents an ill-defined hypoechoic nodule in the left mid thyroid measuring only 8 mm this would  not meet criteria for any biopsy or follow-up. This also could represent mild pseudo nodularity.   Nodule 3 represents an isoechoic medial TR 3 type nodule versus pseudo nodularity measuring only 9 mm. This also would not meet criteria for any biopsy or follow-up and is not  fully described by TI rads criteria.   No hypervascularity.  No regional adenopathy.   IMPRESSION: 1 cm right isthmus TR 4 nodule meets criteria for follow-up in 1 year.   Additional subcentimeter nodules versus pseudo nodularity noted.  ASSESSMENT / PLAN / RECOMMENDATIONS:   Hyperthyroidism:  -Clinically she is improving -She is tolerating methimazole -Her hyperthyroidism could be due to Graves' disease as there was slight elevation in TRAb, but she was also noted to have multinodular goiter on ultrasound which could be another cause -TFTs are normal, no change   Medications   Methimazole 5 mg, half a tablet 4 days a week   2.  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  Multinodular goiter:  -Thyroid ultrasound 10/2021 showed multiple nodules that did not meet criteria for biopsy, will repeat by 10/2022   4. Hashimoto's Disease:  -Patient would like for me to add this to her diagnosis list. - This is based on elevated Anti-TPO , she understands the this is not active at this time and we are treating her for Graves' disease   Follow-up in 6 months Labs in 3 months   Signed electronically by: Mack Guise, MD  [facility]  [facility] [address] [place], [place] [address] Phone: [phone] FAX: [phone]      CC: Wendie Agreste, MD [address] [place] [address] Phone: [phone]  Fax: [phone]   Return to Endocrinology clinic as below: Future Appointments  Date Time Provider [place]  09/12/2022  9:10 AM Maylene Crocker, Melanie Crazier, MD [facility] None  09/12/2022 11:00 AM Desenglau, Tommy Rainwater, PT [facility] None  09/17/2022 12:30 PM Desenglau, Tommy Rainwater, PT [facility] None  09/26/2022  7:40 AM Mellody Dance, DO [facility] None  10/03/2022  9:30 AM Desenglau, Tommy Rainwater, PT  [facility] None  10/10/2022  9:30 AM Desenglau, Tommy Rainwater, PT [facility] None  10/15/2022  8:45 AM Desenglau, Tommy Rainwater, PT [facility] None  10/17/2022  9:00 AM Mellody Dance, DO [facility] None  10/23/2022  9:30 AM Desenglau, Tommy Rainwater, PT [facility] None  10/30/2022  9:30 AM Desenglau, Tommy Rainwater, PT [facility] None

## 2022-09-12 ENCOUNTER — Ambulatory Visit: Payer: 59 | Admitting: Internal Medicine

## 2022-09-12 ENCOUNTER — Encounter: Payer: Self-pay | Admitting: Physical Therapy

## 2022-09-12 ENCOUNTER — Encounter: Payer: Self-pay | Admitting: Internal Medicine

## 2022-09-12 ENCOUNTER — Ambulatory Visit: Payer: 59 | Attending: Gynecologic Oncology | Admitting: Physical Therapy

## 2022-09-12 VITALS — BP 104/72 | HR 60 | Ht 61.0 in | Wt 137.0 lb

## 2022-09-12 DIAGNOSIS — E042 Nontoxic multinodular goiter: Secondary | ICD-10-CM | POA: Diagnosis not present

## 2022-09-12 DIAGNOSIS — E0590 Thyrotoxicosis, unspecified without thyrotoxic crisis or storm: Secondary | ICD-10-CM

## 2022-09-12 DIAGNOSIS — E0500 Thyrotoxicosis with diffuse goiter without thyrotoxic crisis or storm: Secondary | ICD-10-CM | POA: Diagnosis not present

## 2022-09-12 DIAGNOSIS — R278 Other lack of coordination: Secondary | ICD-10-CM | POA: Diagnosis not present

## 2022-09-12 DIAGNOSIS — R102 Pelvic and perineal pain: Secondary | ICD-10-CM | POA: Diagnosis not present

## 2022-09-12 DIAGNOSIS — R252 Cramp and spasm: Secondary | ICD-10-CM | POA: Diagnosis not present

## 2022-09-12 LAB — TSH: TSH: 1.42 u[IU]/mL (ref 0.35–5.50)

## 2022-09-12 LAB — T3: T3, Total: 93 ng/dL (ref 76–181)

## 2022-09-12 LAB — T4, FREE: Free T4: 1.04 ng/dL (ref 0.60–1.60)

## 2022-09-12 NOTE — Therapy (Signed)
OUTPATIENT PHYSICAL THERAPY FEMALE PELVIC TREATMENT   Patient Name: [name] MRN: [id number] DOB:1971-05-24, 51 y.o., female Today's Date: 09/12/2022   PT End of Session - 09/12/22 1059     Visit Number 2    Date for PT Re-Evaluation 11/13/22    Authorization Type [employer]    PT Start Time 1059    PT Stop Time 1143    PT Time Calculation (min) 44 min    Activity Tolerance Patient tolerated treatment well    Behavior During Therapy WFL for tasks assessed/performed              Past Medical History:  Diagnosis Date   Arthritis    Asthma    Complication of anesthesia    respirations slow down after coming out from Anesthesia   Endometriosis    GERD (gastroesophageal reflux disease)    H/O right nephrectomy    Mass of right kidney    Other fatigue    Palpitations    Post-operative nausea and vomiting    Shortness of breath on exertion    Past Surgical History:  Procedure Laterality Date   CERVICAL DISC SURGERY     CESAREAN SECTION     x3 [phone])   CHOLECYSTECTOMY     COLONOSCOPY  12/17/2017   Dr.Stark   EUS N/A 03/14/2017   Procedure: UPPER ENDOSCOPIC ULTRASOUND (EUS) LINEAR;  Surgeon: Milus Banister, MD;  Location: [facility];  Service: Endoscopy;  Laterality: N/A;   I & D EXTREMITY Left 05/26/2021   Procedure: IRRIGATION AND DEBRIDEMENT EXTREMITY WITH COMPLEX CLOSURE OF WOUND;  Surgeon: Meredith Pel, MD;  Location: [place];  Service: Orthopedics;  Laterality: Left;   KNEE ARTHROSCOPY Left 05/26/2021   Procedure: ARTHROSCOPIC KNEE WASHOUT;  Surgeon: Meredith Pel, MD;  Location: [place];  Service: Orthopedics;  Laterality: Left;   LAPAROSCOPIC ABDOMINAL EXPLORATION N/A 05/17/2017   Procedure: LAPAROSCOPIC POSSIBLE OPEN REMOVAL OF STOMACH MASS;  Surgeon: Jackolyn Confer, MD;  Location: [facility];  Service: General;  Laterality: N/A;   LAPAROSCOPIC ASSISTED VAGINAL HYSTERECTOMY  2003   LAPAROSCOPIC NEPHRECTOMY Right 05/17/2017    Procedure: RIGHT RADICAL NEPHRECTOMY, URETERECTOMY;  Surgeon: Ardis Hughs, MD;  Location: [facility];  Service: Urology;  Laterality: Right;   tubal preganancy     Patient Active Problem List   Diagnosis Date Noted   History of endometriosis 08/03/2022   Lower abdominal pain 08/03/2022   Pelvic adhesive disease 08/03/2022   Abnormal craving 08/01/2022   Class 2 severe obesity with serious comorbidity and body mass index (BMI) of 36.0 to 36.9 in adult ([place]) 08/01/2022   Vitamin D deficiency 02/22/2022   NAFLD (nonalcoholic fatty liver disease) 02/22/2022   Pre-diabetes 02/20/2022   H/O right nephrectomy 01/02/2022   Nausea without vomiting 01/02/2022   Graves disease 12/14/2021   Hyperthyroidism 12/14/2021   Multinodular goiter 12/14/2021   Arthritis 11/17/2021   Open fracture of left patella    Laceration of left knee with complication    Open wound of left knee    S/P left knee arthroscopy 05/27/2021   Laceration of left knee 05/26/2021   Mass of ureter s/p resection 05/17/2017 05/18/2017   Atrophic kidney s/p right nepherctomy 05/17/2017 [id number]/00/9381   Complication of anesthesia    Gastric tumor s/p partial gastrectomy 05/17/2016 05/17/2017   Solitary pulmonary nodule 02/18/2017   Genetic testing 04/04/2016   Family history of colon cancer 03/14/2016   Family history of breast cancer in female 03/14/2016  Class 1 obesity with serious comorbidity and body mass index (BMI) of 30.0 to 30.9 in adult 01/18/2012   Adjustment disorder with depressed mood 03/12/2011   Esophageal reflux 08/29/2010   Insomnia, unspecified 09/13/2009   Anxiety state 04/20/2008   Asthma 04/20/2008   LIVER FUNCTION TESTS, ABNORMAL 04/20/2008   Attention deficit hyperactivity disorder (ADHD) 06/10/2007    PCP: Lafonda Mosses, MD  REFERRING PROVIDER: Lafonda Mosses, MD  REFERRING DIAG: [phone] (ICD-10-CM) - Pelvic floor dysfunction  THERAPY DIAG:  Other lack of coordination  Cramp and  spasm  Pelvic pain in female  Rationale for Evaluation and Treatment Rehabilitation  ONSET DATE: chronic   SUBJECTIVE:                                                                                                                                                                                           SUBJECTIVE STATEMENT: Very constipated the last few days. She was very nauseas last few days. She has about the same amount of leakage. The exercise are going well. So is not using heating pad as much.   Bottle Fluid intakeYes: a liter day due to her having one kidney / 7 bottles of water a day  :     PAIN:  Are you having pain? Yes NPRS scale:  2-3 /10 Pain location:  front on the groin R >L  Pain type: throbbing Pain description:  By the night time she is in a lot of pain.     Aggravating factors: Sexual intercourse  Relieving factors: sitting with the heating pad , moving sometimes helps  PRECAUTIONS: None  WEIGHT BEARING RESTRICTIONS No  FALLS:  Has patient fallen in last 6 months? No  LIVING ENVIRONMENT: Lives with: lives with their family and lives with their spouse Lives in: House/apartment  OCCUPATION: Cleaning homes- unable due to pain   PLOF: Independent was walking 13 miles a day   PATIENT GOALS get rid of the pain   PERTINENT HISTORY:   Caesarean section , colonoscopy , endometriosis, GERD, SOB, tubal pregancy, constipation, kidney removal - 5-6 years ago, hysterectomy Sexual abuse: No  BOWEL MOVEMENT Pain with bowel movement: Yes Type of bowel movement:Type (Bristol Stool Scale) loosed since the stool softer  and Strain Yes was laxtive a day  Fully empty rectum: No Leakage: No Pads: No Fiber supplement: Yes: Miralax and probiotic    URINATION Pain with urination: Yes every now again-  Fully empty bladder: Yes:   Stream:  stop and go - fluid pill has help  Urgency: Yes: not supposed to hold  Frequency: every hour  Leakage: Coughing and  Sneezing Pads: No  INTERCOURSE Pain with intercourse: Initial Penetration, During Penetration, and After Intercourse- Haven't been sexual active in 3 months due to the pain Ability to have vaginal penetration:  Yes:   Climax: No due to pain  Marinoff Scale: 3/3  PREGNANCY C-section deliveries 3  Currently pregnant No 4 miscarriages  PROLAPSE Cystocele but was corrected     OBJECTIVE:   DIAGNOSTIC FINDINGS:    PATIENT SURVEYS:   PFIQ-7  COGNITION:  Overall cognitive status: Within functional limits for tasks assessed     SENSATION:  Light touch: Appears intact  Proprioception: Appears intact  MUSCLE LENGTH: Hamstrings: Right 70 deg; Left 65 deg  LUMBAR SPECIAL TESTS:  Active Straight leg raise test: Positive with compression reported it was easier and Single leg stance test: Positive- hip drop while standing on left leg   FUNCTIONAL TESTS:    GAIT: Distance walked: WFL                POSTURE: rounded shoulders, anterior pelvic tilt, and weight shift right   PELVIC ALIGNMENT:  right anterior directed pelvis   LUMBARAROM/PROM  A/PROM A/PROM  eval  Flexion   Extension   Right lateral flexion   Left lateral flexion   Right rotation   Left rotation    (Blank rows = not tested)  LOWER EXTREMITY ROM:  Passive ROM Right eval Left eval  Hip flexion 75% 75%  Hip extension    Hip abduction    Hip adduction    Hip internal rotation 25% 25%  Hip external rotation 50% 50%  Knee flexion    Knee extension    Ankle dorsiflexion    Ankle plantarflexion    Ankle inversion    Ankle eversion     (Blank rows = not tested)  LOWER EXTREMITY MMT:  MMT Right eval Left eval  Hip flexion 3+/5 painful  4/5  Hip extension/ 5/5   Hip abduction 4/5 painful 4/5  painful  Hip adduction 5/5 painful 5/5 painful   Hip internal rotation    Hip external rotation    Knee flexion    Knee extension    Ankle dorsiflexion    Ankle plantarflexion    Ankle inversion     Ankle eversion      PALPATION:   General: adductor tenderness at insertion, hip flexor insertion                 External Perineal Exam: Bil pubic symphysis tenderness, lower perineum body,                              Internal Pelvic Floor: tenderness lateral to urethra BIL R>L, tenderness to obturator internus R>L, Pain at  vaginal introitus, unable to assess posterior pelvic floor due to pain    Patient confirms identification and approves PT to assess internal pelvic floor and treatment Yes  No emotional/communication barriers or cognitive limitation. Patient is motivated to learn. Patient understands and agrees with treatment goals and plan. PT explains patient will be examined in standing, sitting, and lying down to see how their muscles and joints work. When they are ready, they will be asked to remove their underwear so PT can examine their perineum. The patient is also given the option of providing their own chaperone as one is not provided in our facility. The patient also has the right and is explained the right to defer or refuse any part of  the evaluation or treatment including the internal exam. With the patient's consent, PT will use one gloved finger to gently assess the muscles of the pelvic floor, seeing how well it contracts and relaxes and if there is muscle symmetry. After, the patient will get dressed and PT and patient will discuss exam findings and plan of care. PT and patient discuss plan of care, schedule, attendance policy and HEP activities.   PELVIC MMT:   MMT eval  Vaginal 3/5, 4 quick flicks, 3 second holds   Internal Anal Sphincter   External Anal Sphincter   Puborectalis   Diastasis Recti   (Blank rows = not tested)        TONE: High tone  PROLAPSE: No noticeable prolapse   TODAY'S TREATMENT  09/12/2022:  Manual: Patient confirms identification and approved physical therapist to perform muscle strength and integrity assessment: urethra compressor  and levator ani  Abdominal release  Scar tissue release  Self care management: Toileting technique Abdominal massage  Moisturizer and self soft tissue release     08/21/2022: EVAL and HEP NeuroRed:  Diaphragmatic breathing   Diaphragmatic breathing with butterfly stretch  Diaphragmatic breathing  with child pose    PATIENT EDUCATION:  Education details: HEP provided  Person educated: Patient Education method: Explanation, Demonstration, and Handouts Education comprehension: verbalized understanding   HOME EXERCISE PROGRAM: Access Code: [email or web address] URL: [email or web address] Date: 08/21/2022 Prepared by: Jari Favre  Exercises - Supine Diaphragmatic Breathing  - 1 x daily - 7 x weekly - 1 sets - 10 reps - Supine Butterfly Groin Stretch  - 1 x daily - 7 x weekly - 1 sets - 3 reps - 30 sec hold - Diaphragmatic Breathing in Child's Pose with Pelvic Floor Relaxation  - 1 x daily - 7 x weekly - 1 sets - 5 reps - 30 sec hold  ASSESSMENT:  CLINICAL IMPRESSION: Pt met initial goal of HEP and states that she can tell when she doesn't do the stretches.  Patient responded well to therapy today. Today's session focused soft tissue release of pelvic floor muscles, abdominal massage, scar mobilization and learning about toileting techniques. Patient had some tenderness with abdominal fascial release primarily near ascending and descending colon. With the internal soft tissue release, Patient had very high muscle tension and was slowly able to release with cueing of taking deep breaths. Patient had tenderness near urethra and the levator ani insertions. Patient was educated on how to perform STM and abdominal massage at home. Patient would benefit from skilled therapy to continue to address pelvic floor strength, endurance , and pain relief.    OBJECTIVE IMPAIRMENTS decreased coordination, decreased endurance, decreased mobility, decreased ROM, and decreased strength.    ACTIVITY LIMITATIONS lifting, bending, and standing  PARTICIPATION LIMITATIONS: cleaning, interpersonal relationship, community activity, and occupation  PERSONAL FACTORS Past/current experiences, Time since onset of injury/illness/exacerbation, and 3+ comorbidities:   Cesarean section , colonoscopy , endometriosis, GERD, SOB, tubal pregancy, constipation, kidney removal; 5-6 years ago are also affecting patient's functional outcome.   REHAB POTENTIAL: Good  CLINICAL DECISION MAKING: Evolving/moderate complexity  EVALUATION COMPLEXITY: Moderate   GOALS: Goals reviewed with patient? Yes  SHORT TERM GOALS: Target date:10/02/2022 Updated:09/12/2022  Patient will be ind with initial HEP Baseline: Goal status: MET   LONG TERM GOALS: Target date: 11/13/2022 Updated:09/12/2022  Pt will be independent with advanced HEP to maintain improvements made throughout therapy  Baseline:  Goal status: INITIAL  2.  Pt will report 50% reduction of pain due to  improvements in posture, strength, and muscle length  Baseline:  Goal status: INITIAL  3.  Pt will report her BMs are complete due to improved bowel habits and evacuation techniques.  Baseline: partial BM achieved  Goal status: MET  4.  Pt to demonstrate at least 4/5 pelvic floor strength for improved pelvic stability and decreased strain at pelvic floor/ decrease leakage.  Baseline: 3/5 Goal status: INITIAL  5.  Pt to report improved time between bladder voids to at least 2 hours for improved QOL with decreased urinary frequency.   Baseline:  Goal status: INITIAL   PLAN: PT FREQUENCY: 1x/week  PT DURATION: 12 weeks  PLANNED INTERVENTIONS: Therapeutic exercises, Therapeutic activity, Neuromuscular re-education, Balance training, Gait training, Patient/Family education, Self Care, Joint mobilization, Dry Needling, Cryotherapy, Moist heat, Taping, Biofeedback, and Manual therapy  PLAN FOR NEXT SESSION: Continue abdominal massage  and STM. Add stretching, isolation of pelvic floor engagement , f/u on STM, toileting techniques, and abdominal massage.   Rosario Jacks, Student-PT 09/12/2022  12:51 PM    I agree with the following treatment note after reviewing documentation. This session was performed under the supervision of a licensed clinician.  Gustavus Bryant, PT 09/12/22 12:51 PM

## 2022-09-13 ENCOUNTER — Encounter: Payer: Self-pay | Admitting: Internal Medicine

## 2022-09-14 MED ORDER — METHIMAZOLE 5 MG PO TABS: 2.5000 mg | ORAL_TABLET | ORAL | 2 refills | Status: DC

## 2022-09-15 NOTE — Progress Notes (Signed)
Chief Complaint:   OBESITY [name] is here to discuss her progress with her obesity treatment plan along with follow-up of her obesity related diagnoses. [name] is on keeping a food journal and adhering to recommended goals of 1000 calories and 90+ grams protein and states she is following her eating plan approximately 1000% of the time. [name] states she is walking and weights 90 minutes 6 times per week.  Today's visit was #: 25 Starting weight: 190 lbs Starting date: 11/29/2021 Today's weight: 135 lbs Today's date: 09/05/2022 Total lbs lost to date: 66 Total lbs lost since last in-office visit: 0  Interim History: [name] is drinking 3 liters of water per day. She had a big party and is proud she didn't gain. She focused on exercise and not having back or joint pains. Pt continues Yoga and stretching and her physical therapy exercises.  Subjective:   1. Vitamin D deficiency [name] takes OTC Vit D 2000 IU daily.  Assessment/Plan:  No orders of the defined types were placed in this encounter.   There are no discontinued medications.   No orders of the defined types were placed in this encounter.    1. Vitamin D deficiency Vit D at goal. Low Vitamin D level contributes to fatigue and are associated with obesity, breast, and colon cancer. She agrees to continue OTC Vitamin D 2,000 IU daily and will follow-up for routine testing of Vitamin D, at least 2-3 times per year to avoid over-replacement.  2. Obesity with current BMI of 25.6 [name] is currently in the action stage of change. As such, her goal is to continue with weight loss efforts. She has agreed to keeping a food journal and adhering to recommended goals of 1000 calories and 90 grams protein.   [name] lost some fat mass and gained a little muscle mass. She is very pleased. Continue current plan. Continue Mounjaro as written by prescribing provider.  Exercise goals:  As is  Behavioral modification strategies: increasing lean  protein intake, decreasing simple carbohydrates, and increasing water intake.  [name] has agreed to follow-up with our clinic in 3 weeks. She was informed of the importance of frequent follow-up visits to maximize her success with intensive lifestyle modifications for her multiple health conditions.   Objective:   Blood pressure 104/72, pulse 85, temperature 97.7 F (36.5 C), height '5\' 1"'$  (1.549 m), weight 135 lb (61.2 kg), SpO2 99 %. Body mass index is 25.51 kg/m.  General: Cooperative, alert, well developed, in no acute distress. HEENT: Conjunctivae and lids unremarkable. Cardiovascular: Regular rhythm.  Lungs: Normal work of breathing. Neurologic: No focal deficits.   Lab Results  Component Value Date   CREATININE 0.82 05/24/2022   BUN 12 05/24/2022   NA 141 05/24/2022   K 4.8 05/24/2022   CL 102 05/24/2022   CO2 25 05/24/2022   Lab Results  Component Value Date   ALT 36 (H) 05/24/2022   AST 20 05/24/2022   ALKPHOS 98 05/24/2022   BILITOT 0.4 05/24/2022   Lab Results  Component Value Date   HGBA1C 5.6 05/24/2022   HGBA1C 5.7 (H) 02/22/2022   HGBA1C 6.3 09/26/2021   Lab Results  Component Value Date   INSULIN 7.7 05/24/2022   Lab Results  Component Value Date   TSH 1.42 09/12/2022   Lab Results  Component Value Date   CHOL 189 05/24/2022   HDL 49 05/24/2022   LDLCALC 124 (H) 05/24/2022   TRIG 90 05/24/2022   CHOLHDL 3.9  05/24/2022   Lab Results  Component Value Date   VD25OH 62.0 05/24/2022   VD25OH 55.0 02/22/2022   VD25OH 30.3 11/29/2021   Lab Results  Component Value Date   WBC 6.9 11/16/2021   HGB 13.5 11/16/2021   HCT 43.2 11/16/2021   MCV 81.4 11/16/2021   PLT 327.0 11/16/2021    Attestation Statements:   Reviewed by clinician on day of visit: allergies, medications, problem list, medical history, surgical history, family history, social history, and previous encounter notes.  Time spent on visit including pre-visit chart review and  post-visit care and charting was 20 minutes.   I, Kathlene November, BS, CMA, am acting as transcriptionist for [employer], DO.  I have reviewed the above documentation for accuracy and completeness, and I agree with the above. [name], D.O.  The [place] was signed into law in 2016 which includes the topic of electronic health records.  This provides immediate access to information in MyChart.  This includes consultation notes, operative notes, office notes, lab results and pathology reports.  If you have any questions about what you read please let us know at your next visit so we can discuss your concerns and take corrective action if need be.  We are right here with you.

## 2022-09-17 ENCOUNTER — Encounter: Payer: Self-pay | Admitting: Physical Therapy

## 2022-09-17 ENCOUNTER — Ambulatory Visit: Payer: 59 | Admitting: Physical Therapy

## 2022-09-17 DIAGNOSIS — R252 Cramp and spasm: Secondary | ICD-10-CM

## 2022-09-17 DIAGNOSIS — R102 Pelvic and perineal pain: Secondary | ICD-10-CM | POA: Diagnosis not present

## 2022-09-17 DIAGNOSIS — R278 Other lack of coordination: Secondary | ICD-10-CM

## 2022-09-17 NOTE — Therapy (Signed)
OUTPATIENT PHYSICAL THERAPY FEMALE PELVIC TREATMENT   Patient Name: [name] MRN: [id number] DOB:1970/11/26, 51 y.o., female Today's Date: 09/17/2022   PT End of Session - 09/17/22 1233     Visit Number 3    Date for PT Re-Evaluation 11/13/22    Authorization Type [employer]    PT Start Time 1233    PT Stop Time 1313    PT Time Calculation (min) 40 min    Activity Tolerance Patient tolerated treatment well    Behavior During Therapy WFL for tasks assessed/performed               Past Medical History:  Diagnosis Date   Arthritis    Asthma    Complication of anesthesia    respirations slow down after coming out from Anesthesia   Endometriosis    GERD (gastroesophageal reflux disease)    H/O right nephrectomy    Mass of right kidney    Other fatigue    Palpitations    Post-operative nausea and vomiting    Shortness of breath on exertion    Past Surgical History:  Procedure Laterality Date   CERVICAL DISC SURGERY     CESAREAN SECTION     x3 [phone]   CHOLECYSTECTOMY     COLONOSCOPY  12/17/2017   Dr.Stark   EUS N/A 03/14/2017   Procedure: UPPER ENDOSCOPIC ULTRASOUND (EUS) LINEAR;  Surgeon: Milus Banister, MD;  Location: [facility];  Service: Endoscopy;  Laterality: N/A;   I & D EXTREMITY Left 05/26/2021   Procedure: IRRIGATION AND DEBRIDEMENT EXTREMITY WITH COMPLEX CLOSURE OF WOUND;  Surgeon: Meredith Pel, MD;  Location: [facility];  Service: Orthopedics;  Laterality: Left;   KNEE ARTHROSCOPY Left 05/26/2021   Procedure: ARTHROSCOPIC KNEE WASHOUT;  Surgeon: Meredith Pel, MD;  Location: [facility];  Service: Orthopedics;  Laterality: Left;   LAPAROSCOPIC ABDOMINAL EXPLORATION N/A 05/17/2017   Procedure: LAPAROSCOPIC POSSIBLE OPEN REMOVAL OF STOMACH MASS;  Surgeon: Jackolyn Confer, MD;  Location: [facility];  Service: General;  Laterality: N/A;   LAPAROSCOPIC ASSISTED VAGINAL HYSTERECTOMY  2003   LAPAROSCOPIC NEPHRECTOMY Right 05/17/2017    Procedure: RIGHT RADICAL NEPHRECTOMY, URETERECTOMY;  Surgeon: Ardis Hughs, MD;  Location: [facility];  Service: Urology;  Laterality: Right;   tubal preganancy     Patient Active Problem List   Diagnosis Date Noted   History of endometriosis 08/03/2022   Lower abdominal pain 08/03/2022   Pelvic adhesive disease 08/03/2022   Abnormal craving 08/01/2022   Class 2 severe obesity with serious comorbidity and body mass index (BMI) of 36.0 to 36.9 in adult ([place]) 08/01/2022   Vitamin D deficiency 02/22/2022   NAFLD (nonalcoholic fatty liver disease) 02/22/2022   Pre-diabetes 02/20/2022   H/O right nephrectomy 01/02/2022   Nausea without vomiting 01/02/2022   Graves disease 12/14/2021   Hyperthyroidism 12/14/2021   Multinodular goiter 12/14/2021   Arthritis 11/17/2021   Open fracture of left patella    Laceration of left knee with complication    Open wound of left knee    S/P left knee arthroscopy 05/27/2021   Laceration of left knee 05/26/2021   Mass of ureter s/p resection 05/17/2017 05/18/2017   Atrophic kidney s/p right nepherctomy 05/17/2017 61/60/7371   Complication of anesthesia    Gastric tumor s/p partial gastrectomy 05/17/2016 05/17/2017   Solitary pulmonary nodule 02/18/2017   Genetic testing 04/04/2016   Family history of colon cancer 03/14/2016   Family history of breast cancer in female 03/14/2016  Class 1 obesity with serious comorbidity and body mass index (BMI) of 30.0 to 30.9 in adult 01/18/2012   Adjustment disorder with depressed mood 03/12/2011   Esophageal reflux 08/29/2010   Insomnia, unspecified 09/13/2009   Anxiety state 04/20/2008   Asthma 04/20/2008   LIVER FUNCTION TESTS, ABNORMAL 04/20/2008   Attention deficit hyperactivity disorder (ADHD) 06/10/2007    PCP: Lafonda Mosses, MD  REFERRING PROVIDER: Lafonda Mosses, MD  REFERRING DIAG: [phone] (ICD-10-CM) - Pelvic floor dysfunction  THERAPY DIAG:  Cramp and spasm  Other lack of  coordination  Pelvic pain in female  Rationale for Evaluation and Treatment Rehabilitation  ONSET DATE: chronic   SUBJECTIVE:                                                                                                                                                                                           SUBJECTIVE STATEMENT: She was able to have a bowel movement  after last session and day after. She is back constipated. She is still having leakage with coughing.   Bottle Fluid intakeYes: a liter day due to her having one kidney / 7 bottles of water a day  :     PAIN:  Are you having pain? Yes NPRS scale:  2-3 /10 Pain location:  front on the groin R >L  Pain type: throbbing Pain description:  By the night time she is in a lot of pain.     Aggravating factors: Sexual intercourse  Relieving factors: sitting with the heating pad , moving sometimes helps  PRECAUTIONS: None  WEIGHT BEARING RESTRICTIONS No  FALLS:  Has patient fallen in last 6 months? No  LIVING ENVIRONMENT: Lives with: lives with their family and lives with their spouse Lives in: House/apartment  OCCUPATION: Cleaning homes- unable due to pain   PLOF: Independent was walking 13 miles a day   PATIENT GOALS get rid of the pain   PERTINENT HISTORY:   Caesarean section , colonoscopy , endometriosis, GERD, SOB, tubal pregancy, constipation, kidney removal - 5-6 years ago, hysterectomy Sexual abuse: No  BOWEL MOVEMENT Pain with bowel movement: Yes Type of bowel movement:Type (Bristol Stool Scale) loosed since the stool softer  and Strain Yes was laxtive a day  Fully empty rectum: No Leakage: No Pads: No Fiber supplement: Yes: Miralax and probiotic    URINATION Pain with urination: Yes every now again-  Fully empty bladder: Yes:   Stream:  stop and go - fluid pill has help  Urgency: Yes: not supposed to hold  Frequency: every hour  Leakage: Coughing and Sneezing Pads: No  INTERCOURSE Pain  with  intercourse: Initial Penetration, During Penetration, and After Intercourse- Haven't been sexual active in 3 months due to the pain Ability to have vaginal penetration:  Yes:   Climax: No due to pain  Marinoff Scale: 3/3  PREGNANCY C-section deliveries 3  Currently pregnant No 4 miscarriages  PROLAPSE Cystocele but was corrected     OBJECTIVE:   DIAGNOSTIC FINDINGS:    PATIENT SURVEYS:   PFIQ-7  COGNITION:  Overall cognitive status: Within functional limits for tasks assessed     SENSATION:  Light touch: Appears intact  Proprioception: Appears intact  MUSCLE LENGTH: Hamstrings: Right 70 deg; Left 65 deg  LUMBAR SPECIAL TESTS:  Active Straight leg raise test: Positive with compression reported it was easier and Single leg stance test: Positive- hip drop while standing on left leg   FUNCTIONAL TESTS:    GAIT: Distance walked: WFL                POSTURE: rounded shoulders, anterior pelvic tilt, and weight shift right   PELVIC ALIGNMENT:  right anterior directed pelvis   LUMBARAROM/PROM  A/PROM A/PROM  eval  Flexion   Extension   Right lateral flexion   Left lateral flexion   Right rotation   Left rotation    (Blank rows = not tested)  LOWER EXTREMITY ROM:  Passive ROM Right eval Left eval  Hip flexion 75% 75%  Hip extension    Hip abduction    Hip adduction    Hip internal rotation 25% 25%  Hip external rotation 50% 50%  Knee flexion    Knee extension    Ankle dorsiflexion    Ankle plantarflexion    Ankle inversion    Ankle eversion     (Blank rows = not tested)  LOWER EXTREMITY MMT:  MMT Right eval Left eval  Hip flexion 3+/5 painful  4/5  Hip extension/ 5/5   Hip abduction 4/5 painful 4/5  painful  Hip adduction 5/5 painful 5/5 painful   Hip internal rotation    Hip external rotation    Knee flexion    Knee extension    Ankle dorsiflexion    Ankle plantarflexion    Ankle inversion    Ankle eversion      PALPATION:    General: adductor tenderness at insertion, hip flexor insertion                 External Perineal Exam: Bil pubic symphysis tenderness, lower perineum body,                              Internal Pelvic Floor: tenderness lateral to urethra BIL R>L, tenderness to obturator internus R>L, Pain at  vaginal introitus, unable to assess posterior pelvic floor due to pain    Patient confirms identification and approves PT to assess internal pelvic floor and treatment Yes  No emotional/communication barriers or cognitive limitation. Patient is motivated to learn. Patient understands and agrees with treatment goals and plan. PT explains patient will be examined in standing, sitting, and lying down to see how their muscles and joints work. When they are ready, they will be asked to remove their underwear so PT can examine their perineum. The patient is also given the option of providing their own chaperone as one is not provided in our facility. The patient also has the right and is explained the right to defer or refuse any part of the evaluation or treatment including the internal  exam. With the patient's consent, PT will use one gloved finger to gently assess the muscles of the pelvic floor, seeing how well it contracts and relaxes and if there is muscle symmetry. After, the patient will get dressed and PT and patient will discuss exam findings and plan of care. PT and patient discuss plan of care, schedule, attendance policy and HEP activities.   PELVIC MMT:   MMT eval  Vaginal 3/5, 4 quick flicks, 3 second holds   Internal Anal Sphincter   External Anal Sphincter   Puborectalis   Diastasis Recti   (Blank rows = not tested)        TONE: High tone  PROLAPSE: No noticeable prolapse   TODAY'S TREATMENT  09/12/2022:  Manual: Abdominal release  Bladder mobilization  Liver mobilization  STM to lumbar paraspinals Trigger Point Dry-Needling  Treatment instructions: Expect mild to moderate muscle  soreness. S/S of pneumothorax if dry needled over a lung field, and to seek immediate medical attention should they occur. Patient verbalized understanding of these instructions and education.  Patient Consent Given: Yes Education handout provided: No Muscles treated: lumbar multifidi Treatment response/outcome: soft tissue lengthening  Exercise  Lumbar supine rotation  Single leg to chest  Both knees to chest rocking  Ball rollouts   08/21/2022: EVAL and HEP NeuroRed:  Diaphragmatic breathing   Diaphragmatic breathing with butterfly stretch  Diaphragmatic breathing  with child pose    PATIENT EDUCATION:  Education details: HEP provided  Person educated: Patient Education method: Explanation, Demonstration, and Handouts Education comprehension: verbalized understanding   HOME EXERCISE PROGRAM: Access Code: [email or web address] URL: [email or web address] Date: 08/21/2022 Prepared by: Jari Favre  Exercises - Supine Diaphragmatic Breathing  - 1 x daily - 7 x weekly - 1 sets - 10 reps - Supine Butterfly Groin Stretch  - 1 x daily - 7 x weekly - 1 sets - 3 reps - 30 sec hold - Diaphragmatic Breathing in Child's Pose with Pelvic Floor Relaxation  - 1 x daily - 7 x weekly - 1 sets - 5 reps - 30 sec hold  ASSESSMENT:  CLINICAL IMPRESSION: Patient responded well to therapy today. Today's session focused abdominal release and soft tissue release of lumbar spine. Patient is consistently having tenderness with abdominal fascial release primarily near ascending colon. Patient had relief with the trigger point release and STM of lumbar paraspinals. Patient was given a HEP to work on low back stretching. Patient felt the urge to have a bowel movement at the end of the session. Patient would benefit from skilled therapy to continue to address pelvic floor strength, endurance , and pain relief.    OBJECTIVE IMPAIRMENTS decreased coordination, decreased endurance, decreased  mobility, decreased ROM, and decreased strength.   ACTIVITY LIMITATIONS lifting, bending, and standing  PARTICIPATION LIMITATIONS: cleaning, interpersonal relationship, community activity, and occupation  PERSONAL FACTORS Past/current experiences, Time since onset of injury/illness/exacerbation, and 3+ comorbidities:   Cesarean section , colonoscopy , endometriosis, GERD, SOB, tubal pregancy, constipation, kidney removal; 5-6 years ago are also affecting patient's functional outcome.   REHAB POTENTIAL: Good  CLINICAL DECISION MAKING: Evolving/moderate complexity  EVALUATION COMPLEXITY: Moderate   GOALS: Goals reviewed with patient? Yes  SHORT TERM GOALS: Target date:10/02/2022 Updated:09/12/2022  Patient will be ind with initial HEP Baseline: Goal status: MET   LONG TERM GOALS: Target date: 11/13/2022 Updated:09/12/2022  Pt will be independent with advanced HEP to maintain improvements made throughout therapy  Baseline:  Goal status: INITIAL  2.  Pt will report 50%  reduction of pain due to improvements in posture, strength, and muscle length  Baseline:  Goal status: INITIAL  3.  Pt will report her BMs are complete due to improved bowel habits and evacuation techniques.  Baseline: partial BM achieved  Goal status: MET  4.  Pt to demonstrate at least 4/5 pelvic floor strength for improved pelvic stability and decreased strain at pelvic floor/ decrease leakage.  Baseline: 3/5 Goal status: INITIAL  5.  Pt to report improved time between bladder voids to at least 2 hours for improved QOL with decreased urinary frequency.   Baseline:  Goal status: INITIAL   PLAN: PT FREQUENCY: 1x/week  PT DURATION: 12 weeks  PLANNED INTERVENTIONS: Therapeutic exercises, Therapeutic activity, Neuromuscular re-education, Balance training, Gait training, Patient/Family education, Self Care, Joint mobilization, Dry Needling, Cryotherapy, Moist heat, Taping, Biofeedback, and Manual  therapy  PLAN FOR NEXT SESSION: Continue abdominal massage and add basic core engagement, isolation of pelvic floor engagement, and  f/u on dry needling and abdominal massage.   Rosario Jacks, Student-PT 09/17/2022  2:00 PM    I agree with the following treatment note after reviewing documentation. This session was performed under the supervision of a licensed clinician.  Gustavus Bryant, PT 09/17/22 2:00 PM

## 2022-09-19 ENCOUNTER — Other Ambulatory Visit: Payer: 59

## 2022-09-26 ENCOUNTER — Ambulatory Visit (INDEPENDENT_AMBULATORY_CARE_PROVIDER_SITE_OTHER): Payer: 59 | Admitting: Family Medicine

## 2022-10-02 ENCOUNTER — Encounter: Payer: Self-pay | Admitting: Family

## 2022-10-02 ENCOUNTER — Ambulatory Visit (INDEPENDENT_AMBULATORY_CARE_PROVIDER_SITE_OTHER): Payer: 59 | Admitting: Family

## 2022-10-02 VITALS — BP 118/76 | HR 95 | Temp 98.4°F | Ht 61.0 in | Wt 134.8 lb

## 2022-10-02 DIAGNOSIS — J208 Acute bronchitis due to other specified organisms: Secondary | ICD-10-CM

## 2022-10-02 DIAGNOSIS — R051 Acute cough: Secondary | ICD-10-CM

## 2022-10-02 MED ORDER — HYDROCOD POLI-CHLORPHE POLI ER 10-8 MG/5ML PO SUER: 5.0000 mL | Freq: Two times a day (BID) | ORAL | 0 refills | Status: DC | PRN

## 2022-10-02 MED ORDER — FLUCONAZOLE 150 MG PO TABS: 150.0000 mg | ORAL_TABLET | Freq: Every day | ORAL | 0 refills | Status: DC

## 2022-10-02 MED ORDER — AZITHROMYCIN 250 MG PO TABS: 250.0000 mg | ORAL_TABLET | Freq: Every day | ORAL | 0 refills | Status: DC

## 2022-10-02 MED ORDER — METHYLPREDNISOLONE 4 MG PO TBPK: ORAL_TABLET | ORAL | 0 refills | Status: DC

## 2022-10-02 NOTE — Progress Notes (Signed)
Acute Office Visit  Subjective:     Patient ID: [name], female    DOB: 1970/12/01, 51 y.o.   MRN: [id number]  Chief Complaint  Patient presents with   Cough    Pt states she feels like she has bronchitis, pt states it has been going on for 2 weeks    HPI Patient is in today with c/o cough x 2 weeks that has worsened. She has been taking Nyquil w/o much relief. Grandchildren have been sick but have since recovered. No fever or chills.   Review of Systems  Constitutional: Negative.  Negative for chills.  HENT: Negative.    Respiratory:  Positive for cough. Negative for sputum production, shortness of breath and wheezing.   Cardiovascular: Negative.   Gastrointestinal: Negative.   Musculoskeletal: Negative.   Skin: Negative.   Neurological: Negative.   Endo/Heme/Allergies: Negative.   Psychiatric/Behavioral: Negative.    All other systems reviewed and are negative.   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    Comment: Used to work as Quarry manager at [employer]  Tobacco Use   Smoking status: Never   Smokeless tobacco: Never  Vaping Use   Vaping Use: Never used  Substance and Sexual Activity   Alcohol use: No   Drug use: No   Sexual activity: Yes    Birth control/protection: Surgical  Other Topics Concern   Not on file  Social History Narrative   Not on file   Social Determinants of  Health   Financial Resource Strain: Not on file  Food Insecurity: Not on file  Transportation Needs: Not on file  Physical Activity: Not on file  Stress: Not on file  Social Connections: Not on file  Intimate Partner Violence: Not on file    Past Surgical History:  Procedure Laterality Date   CERVICAL [place]     x3 [phone])   CHOLECYSTECTOMY     COLONOSCOPY  12/17/2017   Dr.Stark   EUS N/A 03/14/2017   Procedure: UPPER ENDOSCOPIC ULTRASOUND (EUS) LINEAR;  Surgeon: Milus Banister, MD;  [facility];  Service: Endoscopy;  Laterality: N/A;   I & D EXTREMITY Left 05/26/2021   Procedure: IRRIGATION AND DEBRIDEMENT EXTREMITY WITH COMPLEX CLOSURE OF WOUND;  Surgeon: Meredith Pel, MD;  Location: [facility];  Service: Orthopedics;  Laterality: Left;   KNEE ARTHROSCOPY Left 05/26/2021   Procedure: ARTHROSCOPIC KNEE WASHOUT;  Surgeon: Meredith Pel, MD;  Location: [facility];  Service: Orthopedics;  Laterality: Left;   LAPAROSCOPIC ABDOMINAL EXPLORATION N/A 05/17/2017   Procedure: LAPAROSCOPIC POSSIBLE OPEN REMOVAL OF STOMACH MASS;  Surgeon: Jackolyn Confer, MD;  [facility];  Service: General;  Laterality: N/A;   LAPAROSCOPIC ASSISTED VAGINAL HYSTERECTOMY  2003  LAPAROSCOPIC NEPHRECTOMY Right 05/17/2017   Procedure: RIGHT RADICAL NEPHRECTOMY, URETERECTOMY;  Surgeon: Ardis Hughs, MD;  [facility];  Service: Urology;  Laterality: Right;   tubal preganancy      Family History  Problem Relation Age of Onset   Alcoholism Mother    Thyroid disease Mother    Stroke Mother    Hypertension Mother    Cancer Mother 26       "back cancer"   Other Mother        hysterectomy at 67 for "pre-cancerous cervical cells"   Obesity Father    Cancer Father    Hypertension Father    Colon cancer Father 88   Cervical cancer Sister        full sister dx. unspecified age   Colon polyps Sister        unspecified number   Colon polyps  Sister        paternal half-sister w/ colon polyps dx. age 69 or younger - unspecified number   Colon cancer Sister 7       paternal half-sister    Colon polyps Sister        paternal half-sister; unspecified number   Colon cancer Sister        paternal half-sister dx. before age 75; has had two colon cancers   Breast cancer Sister        paternal half-sister dx under age 49   Renal cancer Sister 45       paternal half-sister dx. w/ "LRC - lymphoid renal cancer"   Cancer Sister 62       paternal half-sister dx cancer of wrist that was not a skin cancer   Breast cancer Maternal Grandmother        unspecified age   Diabetes Maternal Grandmother    Diabetes Paternal Grandfather    Colon polyps Son 46       has had a "bleeding polyp"   Colon cancer Paternal Aunt        (x2) paternal aunts with colon cancer at unspecified ages; lim info   Breast cancer Paternal Aunt        (x3) paternal aunts with breast cancer at unspecified ages; lim info   Colon cancer Paternal Uncle        dx. unspecified age; lim info   Cervical cancer Other 73       niece; +hysterectomy   Alcoholism Other    Obesity Other    Esophageal cancer Neg Hx    Stomach cancer Neg Hx     Allergies  Allergen Reactions   Amoxicillin Anaphylaxis    All "cillins" pt was 78-85 years old  Has patient had a PCN reaction causing immediate rash, facial/tongue/throat swelling, SOB or lightheadedness with hypotension: yes Has patient had a PCN reaction causing severe rash involving mucus membranes or skin necrosis: unknown Has patient had a PCN reaction that required hospitalization yes Has patient had a PCN reaction occurring within the last 10 years:no If all of the above answers are "NO", then may proceed with Cephalosporin use.    Ibuprofen Anaphylaxis and Hives   Keflex [Cephalexin] Anaphylaxis   Penicillins Anaphylaxis    Has patient had a PCN reaction causing immediate rash, facial/tongue/throat swelling, SOB or  lightheadedness with hypotension:yes Has patient had a PCN reaction causing severe rash involving mucus membranes or skin necrosis: unknown Has patient had a PCN reaction that required hospitalization yes  Has patient had a PCN reaction occurring within  the last 10 years: no If all of the above answers are "NO", then may proceed with Cephalosporin use.    Baby Powder [Methylbenzethonium] Hives   Doxycycline Diarrhea and Rash    Current Outpatient Medications on File Prior to Visit  Medication Sig Dispense Refill   albuterol (PROVENTIL HFA) 108 (90 Base) MCG/ACT inhaler Inhale 2 puffs into the lungs every 6 (six) hours as needed for wheezing or shortness of breath. 18 g 0   CEQUA 0.09 % SOLN Apply 1 drop to eye 2 (two) times daily.     Cholecalciferol (VITAMIN D3) 25 MCG (1000 UT) CAPS Take 2 capsules (2,000 Units total) by mouth daily. 60 capsule 5   docusate (COLACE) 60 MG/15ML syrup Take 60 mg by mouth daily.     furosemide (LASIX) 20 MG tablet Take 0.5 tablets (10 mg total) by mouth daily. 90 tablet 1   methimazole (TAPAZOLE) 5 MG tablet Take 0.5 tablets (2.5 mg total) by mouth as directed. Half a tablet 4 days a week 26 tablet 2   MOUNJARO 15 MG/0.5ML Pen SMARTSIG:1 pre-filled pen syringe SUB-Q Once a Week     NON FORMULARY [place]     pantoprazole (PROTONIX) 40 MG tablet Take 1 tablet (40 mg total) by mouth 2 (two) times daily. 60 tablet 11   Polyethylene Glycol 3350 (MIRALAX PO) Take by mouth. (Patient not taking: Reported on 10/02/2022)     No current facility-administered medications on file prior to visit.    BP 118/76   Pulse 95   Temp 98.4 F (36.9 C)   Ht '5\' 1"'$  (1.549 m)   Wt 134 lb 12.8 oz (61.1 kg)   SpO2 98%   BMI 25.47 kg/m chart     Objective:    BP 118/76   Pulse 95   Temp 98.4 F (36.9 C)   Ht '5\' 1"'$  (1.549 m)   Wt 134 lb 12.8 oz (61.1 kg)   SpO2 98%   BMI 25.47 kg/m    Physical Exam Vitals and nursing note reviewed.  Constitutional:       Appearance: Normal appearance. She is normal weight.  HENT:     Head: Normocephalic.     Right Ear: Tympanic membrane and ear canal normal.     Left Ear: Tympanic membrane and ear canal normal.     Mouth/Throat:     Mouth: Mucous membranes are moist.     Pharynx: No oropharyngeal exudate or posterior oropharyngeal erythema.  Cardiovascular:     Rate and Rhythm: Normal rate and regular rhythm.     Pulses: Normal pulses.     Heart sounds: Normal heart sounds.  Pulmonary:     Effort: Pulmonary effort is normal.     Breath sounds: Normal breath sounds. No wheezing.  Musculoskeletal:     Cervical back: Normal range of motion and neck supple.  Skin:    General: Skin is warm and dry.  Neurological:     General: No focal deficit present.     Mental Status: She is alert and oriented to person, place, and time.  Psychiatric:        Mood and Affect: Mood normal.        Behavior: Behavior normal.     No results found for any visits on 10/02/22.      Assessment & Plan:   Problem List Items Addressed This Visit   None Visit Diagnoses     Acute bronchitis due to other specified organisms    -  Primary   Relevant Medications   azithromycin (ZITHROMAX Z-PAK) 250 MG tablet   fluconazole (DIFLUCAN) 150 MG tablet   Acute cough           Meds ordered this encounter  Medications   azithromycin (ZITHROMAX Z-PAK) 250 MG tablet    Sig: Take 1 tablet (250 mg total) by mouth daily. 2 tabs today then once daily    Dispense:  6 tablet    Refill:  0   methylPREDNISolone (MEDROL DOSEPAK) 4 MG TBPK tablet    Sig: As directed    Dispense:  21 tablet    Refill:  0   chlorpheniramine-HYDROcodone (TUSSIONEX) 10-8 MG/5ML    Sig: Take 5 mLs by mouth every 12 (twelve) hours as needed for cough.    Dispense:  115 mL    Refill:  0   fluconazole (DIFLUCAN) 150 MG tablet    Sig: Take 1 tablet (150 mg total) by mouth daily.    Dispense:  1 tablet    Refill:  0   At patient's request, Zpak sent.  Also requests Diflucan for yeast infection r/t antibiotic. Call the office if symptoms worsen or persist.  No follow-ups on file.  Kennyth Arnold, FNP

## 2022-10-03 ENCOUNTER — Encounter: Payer: 59 | Admitting: Physical Therapy

## 2022-10-10 ENCOUNTER — Ambulatory Visit: Payer: 59 | Attending: Gynecologic Oncology | Admitting: Physical Therapy

## 2022-10-10 ENCOUNTER — Encounter: Payer: Self-pay | Admitting: Physical Therapy

## 2022-10-10 DIAGNOSIS — R102 Pelvic and perineal pain: Secondary | ICD-10-CM | POA: Diagnosis not present

## 2022-10-10 DIAGNOSIS — R278 Other lack of coordination: Secondary | ICD-10-CM | POA: Insufficient documentation

## 2022-10-10 DIAGNOSIS — R252 Cramp and spasm: Secondary | ICD-10-CM | POA: Insufficient documentation

## 2022-10-10 NOTE — Therapy (Addendum)
OUTPATIENT PHYSICAL THERAPY FEMALE PELVIC TREATMENT   Patient Name: [name] MRN: [id number] DOB:[birth year], 51 y.o., female Today's Date: 10/10/2022   PT End of Session - 10/10/22 0937     Visit Number 4    Date for PT Re-Evaluation 11/13/22    Authorization Type [employer]    PT Start Time [phone]    PT Stop Time 1015    PT Time Calculation (min) 41 min    Activity Tolerance Patient tolerated treatment well    Behavior During Therapy [facility] for tasks assessed/performed                Past Medical History:  Diagnosis Date   Arthritis    Asthma    Complication of anesthesia    respirations slow down after coming out from Anesthesia   Endometriosis    GERD (gastroesophageal reflux disease)    H/O right nephrectomy    Mass of right kidney    Other fatigue    Palpitations    Post-operative nausea and vomiting    Shortness of breath on exertion    Past Surgical History:  Procedure Laterality Date   CERVICAL DISC SURGERY     CESAREAN SECTION     [phone]   CHOLECYSTECTOMY     COLONOSCOPY  12/17/2017   Dr.Stark   EUS N/A 03/14/2017   Procedure: UPPER ENDOSCOPIC ULTRASOUND (EUS) LINEAR;  Surgeon: Milus Banister, MD;  Location: [facility];  Service: Endoscopy;  Laterality: N/A;   I & D EXTREMITY Left 05/26/2021   Procedure: IRRIGATION AND DEBRIDEMENT EXTREMITY WITH COMPLEX CLOSURE OF WOUND;  Surgeon: Meredith Pel, MD;  Location: [place];  Service: Orthopedics;  Laterality: Left;   KNEE ARTHROSCOPY Left 05/26/2021   Procedure: ARTHROSCOPIC KNEE WASHOUT;  Surgeon: Meredith Pel, MD;  Location: [place];  Service: Orthopedics;  Laterality: Left;   LAPAROSCOPIC ABDOMINAL EXPLORATION N/A 05/17/2017   Procedure: LAPAROSCOPIC POSSIBLE OPEN REMOVAL OF STOMACH MASS;  Surgeon: Jackolyn Confer, MD;  Location: [facility];  Service: General;  Laterality: N/A;   LAPAROSCOPIC ASSISTED VAGINAL HYSTERECTOMY  2003   LAPAROSCOPIC NEPHRECTOMY Right 05/17/2017    Procedure: RIGHT RADICAL NEPHRECTOMY, URETERECTOMY;  Surgeon: Ardis Hughs, MD;  Location: [facility];  Service: Urology;  Laterality: Right;   tubal preganancy     Patient Active Problem List   Diagnosis Date Noted   History of endometriosis 08/03/2022   Lower abdominal pain 08/03/2022   Pelvic adhesive disease 08/03/2022   Abnormal craving 08/01/2022   Class 2 severe obesity with serious comorbidity and body mass index (BMI) of 36.0 to 36.9 in adult ([place]) 08/01/2022   Vitamin D deficiency 02/22/2022   NAFLD (nonalcoholic fatty liver disease) 02/22/2022   Pre-diabetes 02/20/2022   H/O right nephrectomy 01/02/2022   Nausea without vomiting 01/02/2022   Graves disease 12/14/2021   Hyperthyroidism 12/14/2021   Multinodular goiter 12/14/2021   Arthritis 11/17/2021   Open fracture of left patella    Laceration of left knee with complication    Open wound of left knee    S/P left knee arthroscopy 05/27/2021   Laceration of left knee 05/26/2021   Mass of ureter s/p resection 05/17/2017 05/18/2017   Atrophic kidney s/p right nepherctomy 05/17/2017 65/99/3570   Complication of anesthesia    Gastric tumor s/p partial gastrectomy 05/17/2016 05/17/2017   Solitary pulmonary nodule 02/18/2017   Genetic testing 04/04/2016   Family history of colon cancer 03/14/2016   Family history of breast cancer in female 03/14/2016  Class 1 obesity with serious comorbidity and body mass index (BMI) of 30.0 to 30.9 in adult 01/18/2012   Adjustment disorder with depressed mood 03/12/2011   Esophageal reflux 08/29/2010   Insomnia, unspecified 09/13/2009   Anxiety state 04/20/2008   Asthma 04/20/2008   LIVER FUNCTION TESTS, ABNORMAL 04/20/2008   Attention deficit hyperactivity disorder (ADHD) 06/10/2007    PCP: Lafonda Mosses, MD  REFERRING PROVIDER: Lafonda Mosses, MD  REFERRING DIAG: [phone] (ICD-10-CM) - Pelvic floor dysfunction  THERAPY DIAG:  Cramp and spasm  Other lack of  coordination  Pelvic pain in female  Rationale for Evaluation and Treatment Rehabilitation  ONSET DATE: chronic   SUBJECTIVE:                                                                                                                                                                                           SUBJECTIVE STATEMENT: 10/10/2022:She was sick the past couple days and had a lot of coughing. The coughing did cause some leakage. She was not a fan of the dry needling.   Bottle Fluid intakeYes: a liter day due to her having one kidney / 7 bottles of water a day  :     PAIN:  Are you having pain? Yes NPRS scale:  2-3 /10 Pain location:  front on the groin R >L  Pain type: throbbing Pain description:  By the night time she is in a lot of pain.     Aggravating factors: Sexual intercourse  Relieving factors: sitting with the heating pad , moving sometimes helps  PRECAUTIONS: None  WEIGHT BEARING RESTRICTIONS No  FALLS:  Has patient fallen in last 6 months? No  LIVING ENVIRONMENT: Lives with: lives with their family and lives with their spouse Lives in: House/apartment  OCCUPATION: Cleaning homes- unable due to pain   PLOF: Independent was walking 13 miles a day   PATIENT GOALS get rid of the pain   PERTINENT HISTORY:   Caesarean section , colonoscopy , endometriosis, GERD, SOB, tubal pregancy, constipation, kidney removal - 5-6 years ago, hysterectomy Sexual abuse: No  BOWEL MOVEMENT Pain with bowel movement: Yes Type of bowel movement:Type (Bristol Stool Scale) loosed since the stool softer  and Strain Yes was laxtive a day  Fully empty rectum: No Leakage: No Pads: No Fiber supplement: Yes: Miralax and probiotic    URINATION Pain with urination: Yes every now again-  Fully empty bladder: Yes:   Stream:  stop and go - fluid pill has help  Urgency: Yes: not supposed to hold  Frequency: every hour  Leakage: Coughing and Sneezing Pads:  No  INTERCOURSE  Pain with intercourse: Initial Penetration, During Penetration, and After Intercourse- Haven't been sexual active in 3 months due to the pain Ability to have vaginal penetration:  Yes:   Climax: No due to pain  Marinoff Scale: 3/3  PREGNANCY C-section deliveries 3  Currently pregnant No 4 miscarriages  PROLAPSE Cystocele but was corrected     OBJECTIVE:   DIAGNOSTIC FINDINGS:    PATIENT SURVEYS:   PFIQ-7  COGNITION:  Overall cognitive status: Within functional limits for tasks assessed     SENSATION:  Light touch: Appears intact  Proprioception: Appears intact  MUSCLE LENGTH: Hamstrings: Right 70 deg; Left 65 deg  LUMBAR SPECIAL TESTS:  Active Straight leg raise test: Positive with compression reported it was easier and Single leg stance test: Positive- hip drop while standing on left leg   FUNCTIONAL TESTS:    GAIT: Distance walked: WFL                POSTURE: rounded shoulders, anterior pelvic tilt, and weight shift right   PELVIC ALIGNMENT:  right anterior directed pelvis   LUMBARAROM/PROM  A/PROM A/PROM  eval  Flexion   Extension   Right lateral flexion   Left lateral flexion   Right rotation   Left rotation    (Blank rows = not tested)  LOWER EXTREMITY ROM:  Passive ROM Right eval Left eval  Hip flexion 75% 75%  Hip extension    Hip abduction    Hip adduction    Hip internal rotation 25% 25%  Hip external rotation 50% 50%  Knee flexion    Knee extension    Ankle dorsiflexion    Ankle plantarflexion    Ankle inversion    Ankle eversion     (Blank rows = not tested)  LOWER EXTREMITY MMT:  MMT Right eval Left eval  Hip flexion 3+/5 painful  4/5  Hip extension/ 5/5   Hip abduction 4/5 painful 4/5  painful  Hip adduction 5/5 painful 5/5 painful   Hip internal rotation    Hip external rotation    Knee flexion    Knee extension    Ankle dorsiflexion    Ankle plantarflexion    Ankle inversion    Ankle  eversion      PALPATION:   General: adductor tenderness at insertion, hip flexor insertion                 External Perineal Exam: Bil pubic symphysis tenderness, lower perineum body,                              Internal Pelvic Floor: tenderness lateral to urethra BIL R>L, tenderness to obturator internus R>L, Pain at  vaginal introitus, unable to assess posterior pelvic floor due to pain    Patient confirms identification and approves PT to assess internal pelvic floor and treatment Yes  No emotional/communication barriers or cognitive limitation. Patient is motivated to learn. Patient understands and agrees with treatment goals and plan. PT explains patient will be examined in standing, sitting, and lying down to see how their muscles and joints work. When they are ready, they will be asked to remove their underwear so PT can examine their perineum. The patient is also given the option of providing their own chaperone as one is not provided in our facility. The patient also has the right and is explained the right to defer or refuse any part of the evaluation or treatment including  the internal exam. With the patient's consent, PT will use one gloved finger to gently assess the muscles of the pelvic floor, seeing how well it contracts and relaxes and if there is muscle symmetry. After, the patient will get dressed and PT and patient will discuss exam findings and plan of care. PT and patient discuss plan of care, schedule, attendance policy and HEP activities.   PELVIC MMT:   MMT eval  Vaginal 3/5, 4 quick flicks, 3 second holds   Internal Anal Sphincter   External Anal Sphincter   Puborectalis   Diastasis Recti   (Blank rows = not tested)        TONE: High tone  PROLAPSE: No noticeable prolapse   TODAY'S TREATMENT  10/10/2022: Manual: Abdominal release  Bladder mobilization  STM to lumbar paraspinals Exercise  Lumbar supine rotation  Single leg to chest  Both knees to chest  rocking  Sidelying hip abduction on right side 1x 12 Supine butterfly abduction  Supine TA marches   09/12/2022: Manual: Abdominal release  Bladder mobilization  Liver mobilization  STM to lumbar paraspinals Trigger Point Dry-Needling  Treatment instructions: Expect mild to moderate muscle soreness. S/S of pneumothorax if dry needled over a lung field, and to seek immediate medical attention should they occur. Patient verbalized understanding of these instructions and education.  Patient Consent Given: Yes Education handout provided: No Muscles treated: lumbar multifidi Treatment response/outcome: soft tissue lengthening  Exercise  Lumbar supine rotation  Single leg to chest  Both knees to chest rocking  Ball rollouts   08/21/2022: EVAL and HEP NeuroRed:  Diaphragmatic breathing   Diaphragmatic breathing with butterfly stretch  Diaphragmatic breathing  with child pose    PATIENT EDUCATION:  Education details: HEP provided  Person educated: Patient Education method: Explanation, Demonstration, and Handouts Education comprehension: verbalized understanding   HOME EXERCISE PROGRAM: Access Code: [email or web address] URL: [email or web address] Date: 08/21/2022 Prepared by: Jari Favre  Exercises - Supine Diaphragmatic Breathing  - 1 x daily - 7 x weekly - 1 sets - 10 reps - Supine Butterfly Groin Stretch  - 1 x daily - 7 x weekly - 1 sets - 3 reps - 30 sec hold - Diaphragmatic Breathing in Child's Pose with Pelvic Floor Relaxation  - 1 x daily - 7 x weekly - 1 sets - 5 reps - 30 sec hold  ASSESSMENT:  CLINICAL IMPRESSION: Patient responded well to therapy today. Today's session focused abdominal release and soft tissue release of lumbar spine. Patient is consistently having tenderness with abdominal fascial release primarily near ascending colon. Patient had relief with the STM of lumbar paraspinals.  Today we started basic core strengthening and gave a HEP  to address core weakness.Patient would benefit from skilled therapy to continue to address pelvic floor strength, endurance , and pain relief.    OBJECTIVE IMPAIRMENTS decreased coordination, decreased endurance, decreased mobility, decreased ROM, and decreased strength.   ACTIVITY LIMITATIONS lifting, bending, and standing  PARTICIPATION LIMITATIONS: cleaning, interpersonal relationship, community activity, and occupation  PERSONAL FACTORS Past/current experiences, Time since onset of injury/illness/exacerbation, and 3+ comorbidities:   Cesarean section , colonoscopy , endometriosis, GERD, SOB, tubal pregancy, constipation, kidney removal; 5-6 years ago are also affecting patient's functional outcome.   REHAB POTENTIAL: Good  CLINICAL DECISION MAKING: Evolving/moderate complexity  EVALUATION COMPLEXITY: Moderate   GOALS: Goals reviewed with patient? Yes  SHORT TERM GOALS: Target date:10/02/2022 Updated:09/12/2022  Patient will be ind with initial HEP Baseline: Goal status: MET   LONG TERM GOALS:  Target date: 11/13/2022 Updated:09/12/2022  Pt will be independent with advanced HEP to maintain improvements made throughout therapy  Baseline:  Goal status: INITIAL  2.  Pt will report 50% reduction of pain due to improvements in posture, strength, and muscle length  Baseline:  Goal status: INITIAL  3.  Pt will report her BMs are complete due to improved bowel habits and evacuation techniques.  Baseline: partial BM achieved  Goal status: MET  4.  Pt to demonstrate at least 4/5 pelvic floor strength for improved pelvic stability and decreased strain at pelvic floor/ decrease leakage.  Baseline: 3/5 Goal status: INITIAL  5.  Pt to report improved time between bladder voids to at least 2 hours for improved QOL with decreased urinary frequency.   Baseline:  Goal status: INITIAL   PLAN: PT FREQUENCY: 1x/week  PT DURATION: 12 weeks  PLANNED INTERVENTIONS: Therapeutic  exercises, Therapeutic activity, Neuromuscular re-education, Balance training, Gait training, Patient/Family education, Self Care, Joint mobilization, Dry Needling, Cryotherapy, Moist heat, Taping, Biofeedback, and Manual therapy  PLAN FOR NEXT SESSION: add functional core engagement, isolation of pelvic floor engagement, reassess her pelvic floor strength and  f/u on  abdominal massage.   Rosario Jacks, Student-PT 10/10/2022  11:09 AM    I agree with the following treatment note after reviewing documentation. This session was performed under the supervision of a licensed clinician.  Gustavus Bryant, PT 10/10/22 11:09 AM   PHYSICAL THERAPY DISCHARGE SUMMARY  Visits from Start of Care: 4  Current functional level related to goals / functional outcomes: See above goals   Remaining deficits: See above   Education / Equipment: HEP   Patient agrees to discharge. Patient goals were not met. Patient is being discharged due to not returning since the last visit.  Gustavus Bryant, PT, DPT 12/31/22 9:17 AM

## 2022-10-11 ENCOUNTER — Telehealth: Payer: Self-pay

## 2022-10-11 ENCOUNTER — Ambulatory Visit (INDEPENDENT_AMBULATORY_CARE_PROVIDER_SITE_OTHER)
Admission: RE | Admit: 2022-10-11 | Discharge: 2022-10-11 | Disposition: A | Discharge status: Home / Self Care | Payer: 59 | Source: Ambulatory Visit | Attending: Family | Admitting: Family

## 2022-10-11 ENCOUNTER — Ambulatory Visit (INDEPENDENT_AMBULATORY_CARE_PROVIDER_SITE_OTHER): Payer: 59 | Admitting: Family

## 2022-10-11 ENCOUNTER — Other Ambulatory Visit (INDEPENDENT_AMBULATORY_CARE_PROVIDER_SITE_OTHER): Payer: Self-pay | Admitting: Family Medicine

## 2022-10-11 DIAGNOSIS — R051 Acute cough: Secondary | ICD-10-CM | POA: Diagnosis not present

## 2022-10-11 DIAGNOSIS — R300 Dysuria: Secondary | ICD-10-CM | POA: Diagnosis not present

## 2022-10-11 DIAGNOSIS — R059 Cough, unspecified: Secondary | ICD-10-CM | POA: Diagnosis not present

## 2022-10-11 LAB — POCT URINALYSIS DIPSTICK
Glucose, UA: NEGATIVE
Leukocytes, UA: NEGATIVE
Protein, UA: NEGATIVE
Spec Grav, UA: 1.025 (ref 1.010–1.025)
Urobilinogen, UA: 0.2 E.U./dL
pH, UA: 8 (ref 5.0–8.0)

## 2022-10-11 NOTE — Telephone Encounter (Signed)
Patient dropped off urine and will have CXR at the [facility] location today placed orders as she was planning to go over there shortly asking if you will be Rxing anything for UTI

## 2022-10-11 NOTE — Telephone Encounter (Signed)
Pt notes thinks she still has a UTI post Zpak hoping to drop off Urine for testing Also notes cough was better for a few days and now has returned wasn't sure what she should do about that.

## 2022-10-12 NOTE — Therapy (Deleted)
OUTPATIENT PHYSICAL THERAPY FEMALE PELVIC TREATMENT   Patient Name: [name] MRN: [id number] DOB:Jul 09, 1971, 51 y.o., female Today's Date: 10/12/2022        Past Medical History:  Diagnosis Date   Arthritis    Asthma    Complication of anesthesia    respirations slow down after coming out from Anesthesia   Endometriosis    GERD (gastroesophageal reflux disease)    H/O right nephrectomy    Mass of right kidney    Other fatigue    Palpitations    Post-operative nausea and vomiting    Shortness of breath on exertion    Past Surgical History:  Procedure Laterality Date   CERVICAL DISC SURGERY     CESAREAN SECTION     x3 [phone])   CHOLECYSTECTOMY     COLONOSCOPY  12/17/2017   Dr.Stark   EUS N/A 03/14/2017   Procedure: UPPER ENDOSCOPIC ULTRASOUND (EUS) LINEAR;  Surgeon: Milus Banister, MD;  [facility];  Service: Endoscopy;  Laterality: N/A;   I & D EXTREMITY Left 05/26/2021   Procedure: IRRIGATION AND DEBRIDEMENT EXTREMITY WITH COMPLEX CLOSURE OF WOUND;  Surgeon: Meredith Pel, MD;  Location: [facility];  Service: Orthopedics;  Laterality: Left;   KNEE ARTHROSCOPY Left 05/26/2021   Procedure: ARTHROSCOPIC KNEE WASHOUT;  Surgeon: Meredith Pel, MD;  Location: [facility] [place];  Service: Orthopedics;  Laterality: Left;   LAPAROSCOPIC ABDOMINAL EXPLORATION N/A 05/17/2017   Procedure: LAPAROSCOPIC POSSIBLE OPEN REMOVAL OF STOMACH MASS;  Surgeon: Jackolyn Confer, MD;  [facility]: [facility];  Service: General;  Laterality: N/A;   LAPAROSCOPIC ASSISTED VAGINAL HYSTERECTOMY  2003   LAPAROSCOPIC NEPHRECTOMY Right 05/17/2017   Procedure: RIGHT RADICAL NEPHRECTOMY, URETERECTOMY;  Surgeon: Ardis Hughs, MD;  Location: [facility];  Service: Urology;  Laterality: Right;   tubal preganancy     Patient Active Problem List   Diagnosis Date Noted   History of endometriosis 08/03/2022   Lower abdominal pain 08/03/2022   Pelvic adhesive disease 08/03/2022    Abnormal craving 08/01/2022   Class 2 severe obesity with serious comorbidity and body mass index (BMI) of 36.0 to 36.9 in adult ([place]) 08/01/2022   Vitamin D deficiency 02/22/2022   NAFLD (nonalcoholic fatty liver disease) 02/22/2022   Pre-diabetes 02/20/2022   H/O right nephrectomy 01/02/2022   Nausea without vomiting 01/02/2022   Graves disease 12/14/2021   Hyperthyroidism 12/14/2021   Multinodular goiter 12/14/2021   Arthritis 11/17/2021   Open fracture of left patella    Laceration of left knee with complication    Open wound of left knee    S/P left knee arthroscopy 05/27/2021   Laceration of left knee 05/26/2021   Mass of ureter s/p resection 05/17/2017 05/18/2017   Atrophic kidney s/p right nepherctomy 05/17/2017 09/01/2535   Complication of anesthesia    Gastric tumor s/p partial gastrectomy 05/17/2016 05/17/2017   Solitary pulmonary nodule 02/18/2017   Genetic testing 04/04/2016   Family history of colon cancer 03/14/2016   Family history of breast cancer in female 03/14/2016   Class 1 obesity with serious comorbidity and body mass index (BMI) of 30.0 to 30.9 in adult 01/18/2012   Adjustment disorder with depressed mood 03/12/2011   Esophageal reflux 08/29/2010   Insomnia, unspecified 09/13/2009   Anxiety state 04/20/2008   Asthma 04/20/2008   LIVER FUNCTION TESTS, ABNORMAL 04/20/2008   Attention deficit hyperactivity disorder (ADHD) 06/10/2007    PCP: Lafonda Mosses, MD  REFERRING PROVIDER: Lafonda Mosses, MD  REFERRING DIAG:  M62.89 (ICD-10-CM) - Pelvic floor dysfunction  THERAPY DIAG:  No diagnosis found.  Rationale for Evaluation and Treatment Rehabilitation  ONSET DATE: chronic   SUBJECTIVE:                                                                                                                                                                                           SUBJECTIVE STATEMENT: 10/12/2022:She was sick the past couple days and  had a lot of coughing. The coughing did cause some leakage. She was not a fan of the dry needling.   Bottle Fluid intakeYes: a liter day due to her having one kidney / 7 bottles of water a day  :     PAIN:  Are you having pain? Yes NPRS scale:  2-3 /10 Pain location:  front on the groin R >L  Pain type: throbbing Pain description:  By the night time she is in a lot of pain.     Aggravating factors: Sexual intercourse  Relieving factors: sitting with the heating pad , moving sometimes helps  PRECAUTIONS: None  WEIGHT BEARING RESTRICTIONS No  FALLS:  Has patient fallen in last 6 months? No  LIVING ENVIRONMENT: Lives with: lives with their family and lives with their spouse Lives in: House/apartment  OCCUPATION: Cleaning homes- unable due to pain   PLOF: Independent was walking 13 miles a day   PATIENT GOALS get rid of the pain   PERTINENT HISTORY:   Caesarean section , colonoscopy , endometriosis, GERD, SOB, tubal pregancy, constipation, kidney removal - 5-6 years ago, hysterectomy Sexual abuse: No  BOWEL MOVEMENT Pain with bowel movement: Yes Type of bowel movement:Type (Bristol Stool Scale) loosed since the stool softer  and Strain Yes was laxtive a day  Fully empty rectum: No Leakage: No Pads: No Fiber supplement: Yes: Miralax and probiotic    URINATION Pain with urination: Yes every now again-  Fully empty bladder: Yes:   Stream:  stop and go - fluid pill has help  Urgency: Yes: not supposed to hold  Frequency: every hour  Leakage: Coughing and Sneezing Pads: No  INTERCOURSE Pain with intercourse: Initial Penetration, During Penetration, and After Intercourse- Haven't been sexual active in 3 months due to the pain Ability to have vaginal penetration:  Yes:   Climax: No due to pain  Marinoff Scale: 3/3  PREGNANCY C-section deliveries 3  Currently pregnant No 4 miscarriages  PROLAPSE Cystocele but was corrected     OBJECTIVE:   DIAGNOSTIC FINDINGS:     PATIENT SURVEYS:   PFIQ-7  COGNITION:  Overall cognitive status: Within functional limits for tasks assessed  SENSATION:  Light touch: Appears intact  Proprioception: Appears intact  MUSCLE LENGTH: Hamstrings: Right 70 deg; Left 65 deg  LUMBAR SPECIAL TESTS:  Active Straight leg raise test: Positive with compression reported it was easier and Single leg stance test: Positive- hip drop while standing on left leg   FUNCTIONAL TESTS:    GAIT: Distance walked: WFL                POSTURE: rounded shoulders, anterior pelvic tilt, and weight shift right   PELVIC ALIGNMENT:  right anterior directed pelvis   LUMBARAROM/PROM  A/PROM A/PROM  eval  Flexion   Extension   Right lateral flexion   Left lateral flexion   Right rotation   Left rotation    (Blank rows = not tested)  LOWER EXTREMITY ROM:  Passive ROM Right eval Left eval  Hip flexion 75% 75%  Hip extension    Hip abduction    Hip adduction    Hip internal rotation 25% 25%  Hip external rotation 50% 50%  Knee flexion    Knee extension    Ankle dorsiflexion    Ankle plantarflexion    Ankle inversion    Ankle eversion     (Blank rows = not tested)  LOWER EXTREMITY MMT:  MMT Right eval Left eval  Hip flexion 3+/5 painful  4/5  Hip extension/ 5/5   Hip abduction 4/5 painful 4/5  painful  Hip adduction 5/5 painful 5/5 painful   Hip internal rotation    Hip external rotation    Knee flexion    Knee extension    Ankle dorsiflexion    Ankle plantarflexion    Ankle inversion    Ankle eversion      PALPATION:   General: adductor tenderness at insertion, hip flexor insertion                 External Perineal Exam: Bil pubic symphysis tenderness, lower perineum body,                              Internal Pelvic Floor: tenderness lateral to urethra BIL R>L, tenderness to obturator internus R>L, Pain at  vaginal introitus, unable to assess posterior pelvic floor due to pain    Patient  confirms identification and approves PT to assess internal pelvic floor and treatment Yes  No emotional/communication barriers or cognitive limitation. Patient is motivated to learn. Patient understands and agrees with treatment goals and plan. PT explains patient will be examined in standing, sitting, and lying down to see how their muscles and joints work. When they are ready, they will be asked to remove their underwear so PT can examine their perineum. The patient is also given the option of providing their own chaperone as one is not provided in our facility. The patient also has the right and is explained the right to defer or refuse any part of the evaluation or treatment including the internal exam. With the patient's consent, PT will use one gloved finger to gently assess the muscles of the pelvic floor, seeing how well it contracts and relaxes and if there is muscle symmetry. After, the patient will get dressed and PT and patient will discuss exam findings and plan of care. PT and patient discuss plan of care, schedule, attendance policy and HEP activities.   PELVIC MMT:   MMT eval  Vaginal 3/5, 4 quick flicks, 3 second holds   Internal Anal Sphincter  External Anal Sphincter   Puborectalis   Diastasis Recti   (Blank rows = not tested)        TONE: High tone  PROLAPSE: No noticeable prolapse   TODAY'S TREATMENT  10/10/2022: Manual: Abdominal release  Bladder mobilization  STM to lumbar paraspinals Exercise  Lumbar supine rotation  Single leg to chest  Both knees to chest rocking  Sidelying hip abduction on right side 1x 12 Supine butterfly abduction  Supine TA marches   09/12/2022: Manual: Abdominal release  Bladder mobilization  Liver mobilization  STM to lumbar paraspinals Trigger Point Dry-Needling  Treatment instructions: Expect mild to moderate muscle soreness. S/S of pneumothorax if dry needled over a lung field, and to seek immediate medical attention should  they occur. Patient verbalized understanding of these instructions and education.  Patient Consent Given: Yes Education handout provided: No Muscles treated: lumbar multifidi Treatment response/outcome: soft tissue lengthening  Exercise  Lumbar supine rotation  Single leg to chest  Both knees to chest rocking  Ball rollouts   08/21/2022: EVAL and HEP NeuroRed:  Diaphragmatic breathing   Diaphragmatic breathing with butterfly stretch  Diaphragmatic breathing  with child pose    PATIENT EDUCATION:  Education details: HEP provided  Person educated: Patient Education method: Explanation, Demonstration, and Handouts Education comprehension: verbalized understanding   HOME EXERCISE PROGRAM: Access Code: [email or web address] URL: [email or web address] Date: 08/21/2022 Prepared by: Jari Favre  Exercises - Supine Diaphragmatic Breathing  - 1 x daily - 7 x weekly - 1 sets - 10 reps - Supine Butterfly Groin Stretch  - 1 x daily - 7 x weekly - 1 sets - 3 reps - 30 sec hold - Diaphragmatic Breathing in Child's Pose with Pelvic Floor Relaxation  - 1 x daily - 7 x weekly - 1 sets - 5 reps - 30 sec hold  ASSESSMENT:  CLINICAL IMPRESSION: Patient responded well to therapy today. Today's session focused abdominal release and soft tissue release of lumbar spine. Patient is consistently having tenderness with abdominal fascial release primarily near ascending colon. Patient had relief with the STM of lumbar paraspinals.  Today we started basic core strengthening and gave a HEP to address core weakness.Patient would benefit from skilled therapy to continue to address pelvic floor strength, endurance , and pain relief.    OBJECTIVE IMPAIRMENTS decreased coordination, decreased endurance, decreased mobility, decreased ROM, and decreased strength.   ACTIVITY LIMITATIONS lifting, bending, and standing  PARTICIPATION LIMITATIONS: cleaning, interpersonal relationship, community  activity, and occupation  PERSONAL FACTORS Past/current experiences, Time since onset of injury/illness/exacerbation, and 3+ comorbidities:   Cesarean section , colonoscopy , endometriosis, GERD, SOB, tubal pregancy, constipation, kidney removal; 5-6 years ago are also affecting patient's functional outcome.   REHAB POTENTIAL: Good  CLINICAL DECISION MAKING: Evolving/moderate complexity  EVALUATION COMPLEXITY: Moderate   GOALS: Goals reviewed with patient? Yes  SHORT TERM GOALS: Target date:10/02/2022 Updated:09/12/2022  Patient will be ind with initial HEP Baseline: Goal status: MET   LONG TERM GOALS: Target date: 11/13/2022 Updated:09/12/2022  Pt will be independent with advanced HEP to maintain improvements made throughout therapy  Baseline:  Goal status: INITIAL  2.  Pt will report 50% reduction of pain due to improvements in posture, strength, and muscle length  Baseline:  Goal status: INITIAL  3.  Pt will report her BMs are complete due to improved bowel habits and evacuation techniques.  Baseline: partial BM achieved  Goal status: MET  4.  Pt to demonstrate at least 4/5 pelvic floor strength for improved  pelvic stability and decreased strain at pelvic floor/ decrease leakage.  Baseline: 3/5 Goal status: INITIAL  5.  Pt to report improved time between bladder voids to at least 2 hours for improved QOL with decreased urinary frequency.   Baseline:  Goal status: INITIAL   PLAN: PT FREQUENCY: 1x/week  PT DURATION: 12 weeks  PLANNED INTERVENTIONS: Therapeutic exercises, Therapeutic activity, Neuromuscular re-education, Balance training, Gait training, Patient/Family education, Self Care, Joint mobilization, Dry Needling, Cryotherapy, Moist heat, Taping, Biofeedback, and Manual therapy  PLAN FOR NEXT SESSION: add functional core engagement, isolation of pelvic floor engagement, reassess her pelvic floor strength and  f/u on  abdominal massage.   Gustavus Bryant,  PT 10/12/22 8:56 AM

## 2022-10-15 ENCOUNTER — Ambulatory Visit: Payer: 59 | Admitting: Physical Therapy

## 2022-10-15 LAB — URINE CULTURE
MICRO NUMBER:: 14287472
SPECIMEN QUALITY:: ADEQUATE

## 2022-10-15 LAB — HOUSE ACCOUNT TRACKING

## 2022-10-17 ENCOUNTER — Encounter (INDEPENDENT_AMBULATORY_CARE_PROVIDER_SITE_OTHER): Payer: Self-pay | Admitting: Family Medicine

## 2022-10-17 ENCOUNTER — Ambulatory Visit (INDEPENDENT_AMBULATORY_CARE_PROVIDER_SITE_OTHER): Payer: 59 | Admitting: Family Medicine

## 2022-10-17 VITALS — BP 121/78 | HR 74 | Temp 97.5°F | Ht 61.0 in | Wt 129.6 lb

## 2022-10-17 DIAGNOSIS — J40 Bronchitis, not specified as acute or chronic: Secondary | ICD-10-CM | POA: Diagnosis not present

## 2022-10-17 DIAGNOSIS — Z6824 Body mass index (BMI) 24.0-24.9, adult: Secondary | ICD-10-CM | POA: Diagnosis not present

## 2022-10-17 DIAGNOSIS — E669 Obesity, unspecified: Secondary | ICD-10-CM | POA: Diagnosis not present

## 2022-10-18 ENCOUNTER — Telehealth: Payer: Self-pay

## 2022-10-18 ENCOUNTER — Ambulatory Visit
Admission: RE | Admit: 2022-10-18 | Discharge: 2022-10-18 | Disposition: A | Discharge status: Home / Self Care | Payer: 59 | Source: Ambulatory Visit | Attending: Internal Medicine | Admitting: Internal Medicine

## 2022-10-18 DIAGNOSIS — Z803 Family history of malignant neoplasm of breast: Secondary | ICD-10-CM

## 2022-10-18 DIAGNOSIS — E041 Nontoxic single thyroid nodule: Secondary | ICD-10-CM | POA: Diagnosis not present

## 2022-10-18 DIAGNOSIS — E0590 Thyrotoxicosis, unspecified without thyrotoxic crisis or storm: Secondary | ICD-10-CM

## 2022-10-18 NOTE — Telephone Encounter (Signed)
Pt called in asking for her yearly mammogram order, placed order and sent [facility]

## 2022-10-19 ENCOUNTER — Encounter: Payer: Self-pay | Admitting: Internal Medicine

## 2022-10-19 ENCOUNTER — Other Ambulatory Visit: Payer: Self-pay | Admitting: Family Medicine

## 2022-10-19 DIAGNOSIS — Z803 Family history of malignant neoplasm of breast: Secondary | ICD-10-CM

## 2022-10-19 NOTE — Addendum Note (Signed)
Addended by: Patrcia Dolly on: 10/19/2022 04:39 PM   Modules accepted: Orders

## 2022-10-19 NOTE — Telephone Encounter (Signed)
Pt called and and stated the mammogram that was sent was incorrect, I reordered and seemed to come out better on retry, this has again been sent to the [facility]

## 2022-10-23 ENCOUNTER — Ambulatory Visit: Payer: 59 | Admitting: Family

## 2022-10-23 ENCOUNTER — Ambulatory Visit: Payer: 59 | Admitting: Physical Therapy

## 2022-10-23 ENCOUNTER — Other Ambulatory Visit: Payer: Self-pay

## 2022-10-23 ENCOUNTER — Encounter: Payer: Self-pay | Admitting: Family

## 2022-10-23 ENCOUNTER — Telehealth: Payer: Self-pay

## 2022-10-23 VITALS — BP 128/78 | HR 105 | Temp 97.6°F | Ht 61.0 in | Wt 133.6 lb

## 2022-10-23 DIAGNOSIS — J208 Acute bronchitis due to other specified organisms: Secondary | ICD-10-CM | POA: Diagnosis not present

## 2022-10-23 DIAGNOSIS — R059 Cough, unspecified: Secondary | ICD-10-CM | POA: Diagnosis not present

## 2022-10-23 LAB — POCT INFLUENZA A/B
Influenza A, POC: NEGATIVE
Influenza B, POC: NEGATIVE

## 2022-10-23 LAB — POCT RAPID STREP A (OFFICE): Rapid Strep A Screen: NEGATIVE

## 2022-10-23 MED ORDER — PROMETHAZINE-DM 6.25-15 MG/5ML PO SYRP: 5.0000 mL | ORAL_SOLUTION | Freq: Four times a day (QID) | ORAL | 0 refills | Status: DC | PRN

## 2022-10-23 MED ORDER — LEVOFLOXACIN 500 MG PO TABS: 500.0000 mg | ORAL_TABLET | Freq: Every day | ORAL | 0 refills | Status: AC

## 2022-10-23 MED ORDER — LEVOFLOXACIN 500 MG PO TABS: 500.0000 mg | ORAL_TABLET | Freq: Every day | ORAL | 0 refills | Status: DC

## 2022-10-23 MED ORDER — FLUCONAZOLE 150 MG PO TABS: 150.0000 mg | ORAL_TABLET | Freq: Every day | ORAL | 0 refills | Status: DC

## 2022-10-23 MED ORDER — PREDNISONE 20 MG PO TABS: 20.0000 mg | ORAL_TABLET | Freq: Every day | ORAL | 0 refills | Status: DC

## 2022-10-23 MED ORDER — BENZONATATE 200 MG PO CAPS: 200.0000 mg | ORAL_CAPSULE | Freq: Three times a day (TID) | ORAL | 0 refills | Status: DC | PRN

## 2022-10-23 NOTE — Telephone Encounter (Signed)
[place] order for patient has been placed in your to be signed folder and needs to be signed and faxed back at our earliest convenience

## 2022-10-24 NOTE — Telephone Encounter (Signed)
Paperwork completed and placed in fax bin at back nurse station  

## 2022-10-25 NOTE — Progress Notes (Signed)
Acute Office Visit  Subjective:     Patient ID: [name], female    DOB: 1970-12-11, 51 y.o.   MRN: [id number]  Chief Complaint  Patient presents with  . Cough    Pt states she has been around people that have RSV, strep and bronchitis, she has a cough that went away and came back, pt states her throat is on fire, fever, chills body aches, and chest congestion she state she is coughing up green mucous     HPI Patient is in today with c/o fever, cough. congestion and sore throat. She reports coughing of greenish- yellow mucous. She has been exposed to strep, RSV and bronchitis through her grandchildren. She was treated last month with similar symptoms and is requesting another z-pak  Review of Systems  Constitutional:  Positive for fever.  HENT:  Positive for congestion and sore throat.   Eyes: Negative.   Respiratory:  Positive for cough. Negative for shortness of breath.   Gastrointestinal: Negative.   Musculoskeletal: Negative.   Neurological: Negative.   Psychiatric/Behavioral: Negative.    Past Medical History:  Diagnosis Date  . Arthritis   . Asthma   . Complication of anesthesia    respirations slow down after coming out from Anesthesia  . Endometriosis   . GERD (gastroesophageal reflux disease)   . H/O right nephrectomy   . Mass of right kidney   . Other fatigue   . Palpitations   . Post-operative nausea and vomiting   . Shortness of breath on exertion     Social History   Socioeconomic History  . Marital status: Married    Spouse name: [name]  . Number of children: 3  . Years of education: Not on file  . Highest education level: Not on file  Occupational History  . 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 Smoking status: Never  . Smokeless tobacco: Never  Vaping Use  . Vaping Use: Never used  Substance and Sexual Activity  . Alcohol use: No  . Drug use: No  . Sexual activity: Yes     Birth control/protection: Surgical  Other Topics Concern  . Not on file  Social History Narrative  . Not on file   Social Determinants of Health   Financial Resource Strain: Not on file  Food Insecurity: Not on file  Transportation Needs: Not on file  Physical Activity: Not on file  Stress: Not on file  Social Connections: Not on file  Intimate Partner Violence: Not on file    Past Surgical History:  Procedure Laterality Date  . CERVICAL DISC SURGERY    . CESAREAN SECTION     x3 [phone]  . CHOLECYSTECTOMY    . COLONOSCOPY  12/17/2017   Dr.Stark  . EUS N/A 03/14/2017   Procedure: UPPER ENDOSCOPIC ULTRASOUND (EUS) LINEAR;  Surgeon: Milus Banister, MD;  [facility];  Service: Endoscopy;  Laterality: N/A;  . I & D EXTREMITY Left 05/26/2021   Procedure: IRRIGATION AND DEBRIDEMENT EXTREMITY WITH COMPLEX CLOSURE OF WOUND;  Surgeon: Meredith Pel, MD;  Location: [facility];  Service: Orthopedics;  Laterality: Left;  . KNEE ARTHROSCOPY Left 05/26/2021   Procedure: ARTHROSCOPIC KNEE WASHOUT;  Surgeon: Meredith Pel, MD;  Location: [facility];  Service: Orthopedics;  Laterality: Left;  . LAPAROSCOPIC ABDOMINAL EXPLORATION N/A 05/17/2017   Procedure: LAPAROSCOPIC POSSIBLE OPEN REMOVAL OF STOMACH MASS;  Surgeon: Jackolyn Confer,  MD;  [facility];  Service: General;  Laterality: N/A;  . LAPAROSCOPIC ASSISTED VAGINAL HYSTERECTOMY  2003  . LAPAROSCOPIC NEPHRECTOMY Right 05/17/2017   Procedure: RIGHT RADICAL NEPHRECTOMY, URETERECTOMY;  Surgeon: Ardis Hughs, MD;  [facility];  Service: Urology;  Laterality: Right;  . tubal preganancy      Family History  Problem Relation Age of Onset  . Alcoholism Mother   . Thyroid disease Mother   . Stroke Mother   . Hypertension Mother   . Cancer Mother 68       "back cancer"  . Other Mother        hysterectomy at 29 for "pre-cancerous cervical cells"  . Obesity Father   . Cancer Father   . Hypertension  Father   . Colon cancer Father 57  . Cervical cancer Sister        full sister dx. unspecified age  . Colon polyps Sister        unspecified number  . Colon polyps Sister        paternal half-sister w/ colon polyps dx. age 53 or younger - unspecified number  . Colon cancer Sister 7       paternal half-sister   . Colon polyps Sister        paternal half-sister; unspecified number  . Colon cancer Sister        paternal half-sister dx. before age 85; has had two colon cancers  . Breast cancer Sister        paternal half-sister dx under age 42  . Renal cancer Sister 20       paternal half-sister dx. w/ "[facility] - lymphoid renal cancer"  . Cancer Sister 62       paternal half-sister dx cancer of wrist that was not a skin cancer  . Breast cancer Maternal Grandmother        unspecified age  . Diabetes Maternal Grandmother   . Diabetes Paternal Grandfather   . Colon polyps Son 57       has had a "bleeding polyp"  . Colon cancer Paternal Aunt        (x2) paternal aunts with colon cancer at unspecified ages; lim info  . Breast cancer Paternal Aunt        (x3) paternal aunts with breast cancer at unspecified ages; lim info  . Colon cancer Paternal Uncle        dx. unspecified age; lim info  . Cervical cancer Other 25       niece; +hysterectomy  . Alcoholism Other   . Obesity Other   . Esophageal cancer Neg Hx   . Stomach cancer Neg Hx     Allergies  Allergen Reactions  . Amoxicillin Anaphylaxis    All "cillins" pt was 69-64 years old  Has patient had a PCN reaction causing immediate rash, facial/tongue/throat swelling, SOB or lightheadedness with hypotension: yes Has patient had a PCN reaction causing severe rash involving mucus membranes or skin necrosis: unknown Has patient had a PCN reaction that required hospitalization yes Has patient had a PCN reaction occurring within the last 10 years:no If all of the above answers are "NO", then may proceed with Cephalosporin use.   .  Ibuprofen Anaphylaxis and Hives  . Keflex [Cephalexin] Anaphylaxis  . Penicillins Anaphylaxis    Has patient had a PCN reaction causing immediate rash, facial/tongue/throat swelling, SOB or lightheadedness with hypotension:yes Has patient had a PCN reaction causing severe rash involving mucus membranes or skin  necrosis: unknown Has patient had a PCN reaction that required hospitalization yes  Has patient had a PCN reaction occurring within the last 10 years: no If all of the above answers are "NO", then may proceed with Cephalosporin use.   . Baby Powder [Methylbenzethonium] Hives  . Doxycycline Diarrhea and Rash    Current Outpatient Medications on File Prior to Visit  Medication Sig Dispense Refill  . albuterol (PROVENTIL HFA) 108 (90 Base) MCG/ACT inhaler Inhale 2 puffs into the lungs every 6 (six) hours as needed for wheezing or shortness of breath. 18 g 0  . CEQUA 0.09 % SOLN Apply 1 drop to eye 2 (two) times daily.    . chlorpheniramine-HYDROcodone (TUSSIONEX) 10-8 MG/5ML Take 5 mLs by mouth every 12 (twelve) hours as needed for cough. 115 mL 0  . Cholecalciferol (VITAMIN D3) 25 MCG (1000 UT) CAPS Take 2 capsules (2,000 Units total) by mouth daily. 60 capsule 5  . docusate (COLACE) 60 MG/15ML syrup Take 60 mg by mouth daily.    . furosemide (LASIX) 20 MG tablet Take 0.5 tablets (10 mg total) by mouth daily. 90 tablet 1  . methimazole (TAPAZOLE) 5 MG tablet Take 0.5 tablets (2.5 mg total) by mouth as directed. Half a tablet 4 days a week 26 tablet 2  . MOUNJARO 15 MG/0.5ML Pen SMARTSIG:1 pre-filled pen syringe SUB-Q Once a Week    . NON FORMULARY [place]    . pantoprazole (PROTONIX) 40 MG tablet Take 1 tablet (40 mg total) by mouth 2 (two) times daily. 60 tablet 11  . azithromycin (ZITHROMAX Z-PAK) 250 MG tablet Take 1 tablet (250 mg total) by mouth daily. 2 tabs today then once daily (Patient not taking: Reported on 10/23/2022) 6 tablet 0  . Polyethylene Glycol 3350 (MIRALAX PO)  Take by mouth. (Patient not taking: Reported on 10/02/2022)     No current facility-administered medications on file prior to visit.    BP 128/78   Pulse (!) 105   Temp 97.6 F (36.4 C)   Ht '5\' 1"'$  (1.549 m)   Wt 133 lb 9.6 oz (60.6 kg)   SpO2 99%   BMI 25.24 kg/m chart      Objective:    BP 128/78   Pulse (!) 105   Temp 97.6 F (36.4 C)   Ht '5\' 1"'$  (1.549 m)   Wt 133 lb 9.6 oz (60.6 kg)   SpO2 99%   BMI 25.24 kg/m    Physical Exam Constitutional:      Appearance: Normal appearance. She is normal weight.  HENT:     Right Ear: Tympanic membrane, ear canal and external ear normal.     Left Ear: Tympanic membrane, ear canal and external ear normal.     Nose: Nose normal.     Mouth/Throat:     Mouth: Mucous membranes are moist.  Cardiovascular:     Rate and Rhythm: Normal rate and regular rhythm.  Pulmonary:     Effort: Pulmonary effort is normal.     Breath sounds: Normal breath sounds. No wheezing or rhonchi.  Musculoskeletal:        General: Normal range of motion.     Cervical back: Normal range of motion and neck supple.  Skin:    General: Skin is warm and dry.  Neurological:     General: No focal deficit present.     Mental Status: She is alert and oriented to person, place, and time.  Psychiatric:  Mood and Affect: Mood normal.        Behavior: Behavior normal.   Results for orders placed or performed in visit on 10/23/22  POCT rapid strep A  Result Value Ref Range   Rapid Strep A Screen Negative Negative  POCT Influenza A/B  Result Value Ref Range   Influenza A, POC Negative Negative   Influenza B, POC Negative Negative        Assessment & Plan:   Problem List Items Addressed This Visit   None Visit Diagnoses     Cough in adult    -  Primary   Relevant Orders   POCT rapid strep A (Completed)   POCT Influenza A/B (Completed)   Acute bronchitis due to other specified organisms          Due to allergies and reoccurnce of symptoms.  A zpak is not ideal. Will prescribe Levaquin once daily, prednisone, and Promethazine DM. Patient also requested Ladona Ridgel so she could take them during the day. Additionally she wanted Diflucan for yeast infection. Call the office if symptoms worsen or persist. Recheck as scheduled and sooner as needed.    No follow-ups on file.  Kennyth Arnold, FNP

## 2022-10-30 ENCOUNTER — Ambulatory Visit: Payer: 59 | Admitting: Physical Therapy

## 2022-10-30 NOTE — Progress Notes (Signed)
Chief Complaint:   OBESITY [name] is here to discuss her progress with her obesity treatment plan along with follow-up of her obesity related diagnoses. [name] is on  9 0 and states she is following her eating plan approximately 0% of the time. [name] states she is exercising.  Today's visit was #: 41 Starting weight: 190 LBS Starting date: 11/29/2021 Today's weight: 129 LBS Today's date: 10/17/2022 Total lbs lost to date: 31 LBS Total lbs lost since last in-office visit: 6 LBS  Interim History: [name] is here for a follow up office visit.  We reviewed her meal plan and all questions were answered.  Patient's food recall appears to be accurate and consistent with what is on plan when she is following it.   When eating on plan, her hunger and cravings are well controlled.  Patient was sick with bronchitis she lost 2.5 LBS of multiple, also fat mass.  Subjective:   1. Bronchitis Patient was on a Z-Pak, and Medrol Dosepak.  She still been gaining weight.  She was not eating much while she was sick.  Assessment/Plan:  No orders of the defined types were placed in this encounter.   There are no discontinued medications.   No orders of the defined types were placed in this encounter.    1. Bronchitis Slowly improving and getting to feeling back to normal.  Increase protein intake and try not to skip meals, or foods.  2. Obesity with current BMI of 24.5 Patient is at a normal BMI, however she desires to wait 110 pounds.  [name] is currently in the action stage of change. As such, her goal is to continue with weight loss efforts. She has agreed to keeping a food journal and adhering to recommended goals of 1000 calories and 90 protein.   Exercise goals:  As is.  Behavioral modification strategies: increasing lean protein intake, decreasing simple carbohydrates, and avoiding temptations.  Unice has agreed to follow-up with our clinic in 3 weeks. She was informed of the  importance of frequent follow-up visits to maximize her success with intensive lifestyle modifications for her multiple health conditions.   Objective:   Blood pressure 121/78, pulse 74, temperature (!) 97.5 F (36.4 C), height '5\' 1"'$  (1.549 m), weight 129 lb 9.6 oz (58.8 kg), SpO2 100 %. Body mass index is 24.49 kg/m.  General: Cooperative, alert, well developed, in no acute distress. HEENT: Conjunctivae and lids unremarkable. Cardiovascular: Regular rhythm.  Lungs: Normal work of breathing. Neurologic: No focal deficits.   Lab Results  Component Value Date   CREATININE 0.82 05/24/2022   BUN 12 05/24/2022   NA 141 05/24/2022   K 4.8 05/24/2022   CL 102 05/24/2022   CO2 25 05/24/2022   Lab Results  Component Value Date   ALT 36 (H) 05/24/2022   AST 20 05/24/2022   ALKPHOS 98 05/24/2022   BILITOT 0.4 05/24/2022   Lab Results  Component Value Date   HGBA1C 5.6 05/24/2022   HGBA1C 5.7 (H) 02/22/2022   HGBA1C 6.3 09/26/2021   Lab Results  Component Value Date   INSULIN 7.7 05/24/2022   Lab Results  Component Value Date   TSH 1.42 09/12/2022   Lab Results  Component Value Date   CHOL 189 05/24/2022   HDL 49 05/24/2022   LDLCALC 124 (H) 05/24/2022   TRIG 90 05/24/2022   CHOLHDL 3.9 05/24/2022   Lab Results  Component Value Date   VD25OH 62.0 05/24/2022  VD25OH 55.0 02/22/2022   VD25OH 30.3 11/29/2021   Lab Results  Component Value Date   WBC 6.9 11/16/2021   HGB 13.5 11/16/2021   HCT 43.2 11/16/2021   MCV 81.4 11/16/2021   PLT 327.0 11/16/2021   No results found for: "IRON", "TIBC", "FERRITIN"  Attestation Statements:   Reviewed by clinician on day of visit: allergies, medications, problem list, medical history, surgical history, family history, social history, and previous encounter notes.  Time spent on visit including pre-visit chart review and post-visit care and charting was 30 minutes.   I, Davy Pique, RMA, am acting as Location manager for  [employer], DO.   I have reviewed the above documentation for accuracy and completeness, and I agree with the above. [name], D.O.  The [place] was signed into law in 2016 which includes the topic of electronic health records.  This provides immediate access to information in MyChart.  This includes consultation notes, operative notes, office notes, lab results and pathology reports.  If you have any questions about what you read please let us know at your next visit so we can discuss your concerns and take corrective action if need be.  We are right here with you.

## 2022-10-30 NOTE — Therapy (Deleted)
OUTPATIENT PHYSICAL THERAPY FEMALE PELVIC TREATMENT   Patient Name: [name] MRN: [id number] DOB:[birth year], 51 y.o., female Today's Date: 10/30/2022        Past Medical History:  Diagnosis Date   Arthritis    Asthma    Complication of anesthesia    respirations slow down after coming out from Anesthesia   Endometriosis    GERD (gastroesophageal reflux disease)    H/O right nephrectomy    Mass of right kidney    Other fatigue    Palpitations    Post-operative nausea and vomiting    Shortness of breath on exertion    Past Surgical History:  Procedure Laterality Date   CERVICAL DISC SURGERY     CESAREAN SECTION     [phone]   CHOLECYSTECTOMY     COLONOSCOPY  12/17/2017   Dr.Stark   EUS N/A 03/14/2017   Procedure: UPPER ENDOSCOPIC ULTRASOUND (EUS) LINEAR;  Surgeon: Milus Banister, MD;  Location: [facility];  Service: Endoscopy;  Laterality: N/A;   I & D EXTREMITY Left 05/26/2021   Procedure: IRRIGATION AND DEBRIDEMENT EXTREMITY WITH COMPLEX CLOSURE OF WOUND;  Surgeon: Meredith Pel, MD;  Location: [place];  Service: Orthopedics;  Laterality: Left;   KNEE ARTHROSCOPY Left 05/26/2021   Procedure: ARTHROSCOPIC KNEE WASHOUT;  Surgeon: Meredith Pel, MD;  Location: [place];  Service: Orthopedics;  Laterality: Left;   LAPAROSCOPIC ABDOMINAL EXPLORATION N/A 05/17/2017   Procedure: LAPAROSCOPIC POSSIBLE OPEN REMOVAL OF STOMACH MASS;  Surgeon: Jackolyn Confer, MD;  Location: [facility];  Service: General;  Laterality: N/A;   LAPAROSCOPIC ASSISTED VAGINAL HYSTERECTOMY  2003   LAPAROSCOPIC NEPHRECTOMY Right 05/17/2017   Procedure: RIGHT RADICAL NEPHRECTOMY, URETERECTOMY;  Surgeon: Ardis Hughs, MD;  Location: [facility];  Service: Urology;  Laterality: Right;   tubal preganancy     Patient Active Problem List   Diagnosis Date Noted   Bronchitis 10/17/2022   History of endometriosis 08/03/2022   Lower abdominal pain 08/03/2022   Pelvic  adhesive disease 08/03/2022   Abnormal craving 08/01/2022   Class 2 severe obesity with serious comorbidity and body mass index (BMI) of 36.0 to 36.9 in adult ([place]) 08/01/2022   Vitamin D deficiency 02/22/2022   NAFLD (nonalcoholic fatty liver disease) 02/22/2022   Pre-diabetes 02/20/2022   H/O right nephrectomy 01/02/2022   Nausea without vomiting 01/02/2022   Graves disease 12/14/2021   Hyperthyroidism 12/14/2021   Multinodular goiter 12/14/2021   Arthritis 11/17/2021   Open fracture of left patella    Laceration of left knee with complication    Open wound of left knee    S/P left knee arthroscopy 05/27/2021   Laceration of left knee 05/26/2021   Mass of ureter s/p resection 05/17/2017 05/18/2017   Atrophic kidney s/p right nepherctomy 05/17/2017 62/83/1517   Complication of anesthesia    Gastric tumor s/p partial gastrectomy 05/17/2016 05/17/2017   Solitary pulmonary nodule 02/18/2017   Genetic testing 04/04/2016   Family history of colon cancer 03/14/2016   Family history of breast cancer in female 03/14/2016   Class 1 obesity with serious comorbidity and body mass index (BMI) of 30.0 to 30.9 in adult 01/18/2012   Adjustment disorder with depressed mood 03/12/2011   Esophageal reflux 08/29/2010   Insomnia, unspecified 09/13/2009   Anxiety state 04/20/2008   Asthma 04/20/2008   LIVER FUNCTION TESTS, ABNORMAL 04/20/2008   Attention deficit hyperactivity disorder (ADHD) 06/10/2007    PCP: Lafonda Mosses, MD  REFERRING PROVIDER: Lafonda Mosses,  MD  REFERRING DIAG: M62.89 (ICD-10-CM) - Pelvic floor dysfunction  THERAPY DIAG:  No diagnosis found.  Rationale for Evaluation and Treatment Rehabilitation  ONSET DATE: chronic   SUBJECTIVE:                                                                                                                                                                                           SUBJECTIVE STATEMENT: 10/30/2022:She was  sick the past couple days and had a lot of coughing. The coughing did cause some leakage. She was not a fan of the dry needling.   Bottle Fluid intakeYes: a liter day due to her having one kidney / 7 bottles of water a day  :     PAIN:  Are you having pain? Yes NPRS scale:  2-3 /10 Pain location:  front on the groin R >L  Pain type: throbbing Pain description:  By the night time she is in a lot of pain.     Aggravating factors: Sexual intercourse  Relieving factors: sitting with the heating pad , moving sometimes helps  PRECAUTIONS: None  WEIGHT BEARING RESTRICTIONS No  FALLS:  Has patient fallen in last 6 months? No  LIVING ENVIRONMENT: Lives with: lives with their family and lives with their spouse Lives in: House/apartment  OCCUPATION: Cleaning homes- unable due to pain   PLOF: Independent was walking 13 miles a day   PATIENT GOALS get rid of the pain   PERTINENT HISTORY:   Caesarean section , colonoscopy , endometriosis, GERD, SOB, tubal pregancy, constipation, kidney removal - 5-6 years ago, hysterectomy Sexual abuse: No  BOWEL MOVEMENT Pain with bowel movement: Yes Type of bowel movement:Type (Bristol Stool Scale) loosed since the stool softer  and Strain Yes was laxtive a day  Fully empty rectum: No Leakage: No Pads: No Fiber supplement: Yes: Miralax and probiotic    URINATION Pain with urination: Yes every now again-  Fully empty bladder: Yes:   Stream:  stop and go - fluid pill has help  Urgency: Yes: not supposed to hold  Frequency: every hour  Leakage: Coughing and Sneezing Pads: No  INTERCOURSE Pain with intercourse: Initial Penetration, During Penetration, and After Intercourse- Haven't been sexual active in 3 months due to the pain Ability to have vaginal penetration:  Yes:   Climax: No due to pain  Marinoff Scale: 3/3  PREGNANCY C-section deliveries 3  Currently pregnant No 4 miscarriages  PROLAPSE Cystocele but was corrected      OBJECTIVE:   DIAGNOSTIC FINDINGS:    PATIENT SURVEYS:   PFIQ-7  COGNITION:  Overall cognitive status: Within functional limits for tasks  assessed     SENSATION:  Light touch: Appears intact  Proprioception: Appears intact  MUSCLE LENGTH: Hamstrings: Right 70 deg; Left 65 deg  LUMBAR SPECIAL TESTS:  Active Straight leg raise test: Positive with compression reported it was easier and Single leg stance test: Positive- hip drop while standing on left leg   FUNCTIONAL TESTS:    GAIT: Distance walked: WFL                POSTURE: rounded shoulders, anterior pelvic tilt, and weight shift right   PELVIC ALIGNMENT:  right anterior directed pelvis   LUMBARAROM/PROM  A/PROM A/PROM  eval  Flexion   Extension   Right lateral flexion   Left lateral flexion   Right rotation   Left rotation    (Blank rows = not tested)  LOWER EXTREMITY ROM:  Passive ROM Right eval Left eval  Hip flexion 75% 75%  Hip extension    Hip abduction    Hip adduction    Hip internal rotation 25% 25%  Hip external rotation 50% 50%  Knee flexion    Knee extension    Ankle dorsiflexion    Ankle plantarflexion    Ankle inversion    Ankle eversion     (Blank rows = not tested)  LOWER EXTREMITY MMT:  MMT Right eval Left eval  Hip flexion 3+/5 painful  4/5  Hip extension/ 5/5   Hip abduction 4/5 painful 4/5  painful  Hip adduction 5/5 painful 5/5 painful   Hip internal rotation    Hip external rotation    Knee flexion    Knee extension    Ankle dorsiflexion    Ankle plantarflexion    Ankle inversion    Ankle eversion      PALPATION:   General: adductor tenderness at insertion, hip flexor insertion                 External Perineal Exam: Bil pubic symphysis tenderness, lower perineum body,                              Internal Pelvic Floor: tenderness lateral to urethra BIL R>L, tenderness to obturator internus R>L, Pain at  vaginal introitus, unable to assess posterior  pelvic floor due to pain    Patient confirms identification and approves PT to assess internal pelvic floor and treatment Yes  No emotional/communication barriers or cognitive limitation. Patient is motivated to learn. Patient understands and agrees with treatment goals and plan. PT explains patient will be examined in standing, sitting, and lying down to see how their muscles and joints work. When they are ready, they will be asked to remove their underwear so PT can examine their perineum. The patient is also given the option of providing their own chaperone as one is not provided in our facility. The patient also has the right and is explained the right to defer or refuse any part of the evaluation or treatment including the internal exam. With the patient's consent, PT will use one gloved finger to gently assess the muscles of the pelvic floor, seeing how well it contracts and relaxes and if there is muscle symmetry. After, the patient will get dressed and PT and patient will discuss exam findings and plan of care. PT and patient discuss plan of care, schedule, attendance policy and HEP activities.   PELVIC MMT:   MMT eval  Vaginal 3/5, 4 quick flicks, 3 second holds  Internal Anal Sphincter   External Anal Sphincter   Puborectalis   Diastasis Recti   (Blank rows = not tested)        TONE: High tone  PROLAPSE: No noticeable prolapse   TODAY'S TREATMENT  10/10/2022: Manual: Abdominal release  Bladder mobilization  STM to lumbar paraspinals Exercise  Lumbar supine rotation  Single leg to chest  Both knees to chest rocking  Sidelying hip abduction on right side 1x 12 Supine butterfly abduction  Supine TA marches   09/12/2022: Manual: Abdominal release  Bladder mobilization  Liver mobilization  STM to lumbar paraspinals Trigger Point Dry-Needling  Treatment instructions: Expect mild to moderate muscle soreness. S/S of pneumothorax if dry needled over a lung field, and to  seek immediate medical attention should they occur. Patient verbalized understanding of these instructions and education.  Patient Consent Given: Yes Education handout provided: No Muscles treated: lumbar multifidi Treatment response/outcome: soft tissue lengthening  Exercise  Lumbar supine rotation  Single leg to chest  Both knees to chest rocking  Ball rollouts   08/21/2022: EVAL and HEP NeuroRed:  Diaphragmatic breathing   Diaphragmatic breathing with butterfly stretch  Diaphragmatic breathing  with child pose    PATIENT EDUCATION:  Education details: HEP provided  Person educated: Patient Education method: Explanation, Demonstration, and Handouts Education comprehension: verbalized understanding   HOME EXERCISE PROGRAM: Access Code: [email or web address] URL: [email or web address] Date: 08/21/2022 Prepared by: Jari Favre  Exercises - Supine Diaphragmatic Breathing  - 1 x daily - 7 x weekly - 1 sets - 10 reps - Supine Butterfly Groin Stretch  - 1 x daily - 7 x weekly - 1 sets - 3 reps - 30 sec hold - Diaphragmatic Breathing in Child's Pose with Pelvic Floor Relaxation  - 1 x daily - 7 x weekly - 1 sets - 5 reps - 30 sec hold  ASSESSMENT:  CLINICAL IMPRESSION: Patient responded well to therapy today. Today's session focused abdominal release and soft tissue release of lumbar spine. Patient is consistently having tenderness with abdominal fascial release primarily near ascending colon. Patient had relief with the STM of lumbar paraspinals.  Today we started basic core strengthening and gave a HEP to address core weakness.Patient would benefit from skilled therapy to continue to address pelvic floor strength, endurance , and pain relief.    OBJECTIVE IMPAIRMENTS decreased coordination, decreased endurance, decreased mobility, decreased ROM, and decreased strength.   ACTIVITY LIMITATIONS lifting, bending, and standing  PARTICIPATION LIMITATIONS: cleaning,  interpersonal relationship, community activity, and occupation  PERSONAL FACTORS Past/current experiences, Time since onset of injury/illness/exacerbation, and 3+ comorbidities:   Cesarean section , colonoscopy , endometriosis, GERD, SOB, tubal pregancy, constipation, kidney removal; 5-6 years ago are also affecting patient's functional outcome.   REHAB POTENTIAL: Good  CLINICAL DECISION MAKING: Evolving/moderate complexity  EVALUATION COMPLEXITY: Moderate   GOALS: Goals reviewed with patient? Yes  SHORT TERM GOALS: Target date:10/02/2022 Updated:09/12/2022  Patient will be ind with initial HEP Baseline: Goal status: MET   LONG TERM GOALS: Target date: 11/13/2022 Updated:09/12/2022  Pt will be independent with advanced HEP to maintain improvements made throughout therapy  Baseline:  Goal status: INITIAL  2.  Pt will report 50% reduction of pain due to improvements in posture, strength, and muscle length  Baseline:  Goal status: INITIAL  3.  Pt will report her BMs are complete due to improved bowel habits and evacuation techniques.  Baseline: partial BM achieved  Goal status: MET  4.  Pt to demonstrate at least 4/5  pelvic floor strength for improved pelvic stability and decreased strain at pelvic floor/ decrease leakage.  Baseline: 3/5 Goal status: INITIAL  5.  Pt to report improved time between bladder voids to at least 2 hours for improved QOL with decreased urinary frequency.   Baseline:  Goal status: INITIAL   PLAN: PT FREQUENCY: 1x/week  PT DURATION: 12 weeks  PLANNED INTERVENTIONS: Therapeutic exercises, Therapeutic activity, Neuromuscular re-education, Balance training, Gait training, Patient/Family education, Self Care, Joint mobilization, Dry Needling, Cryotherapy, Moist heat, Taping, Biofeedback, and Manual therapy  PLAN FOR NEXT SESSION: add functional core engagement, isolation of pelvic floor engagement, reassess her pelvic floor strength and  f/u on   abdominal massage.   Gustavus Bryant, PT 10/30/22 7:56 AM

## 2022-11-08 ENCOUNTER — Encounter (INDEPENDENT_AMBULATORY_CARE_PROVIDER_SITE_OTHER): Payer: Self-pay | Admitting: Family Medicine

## 2022-11-08 ENCOUNTER — Ambulatory Visit (INDEPENDENT_AMBULATORY_CARE_PROVIDER_SITE_OTHER): Payer: 59 | Admitting: Family Medicine

## 2022-11-08 VITALS — BP 113/75 | HR 86 | Temp 97.7°F | Ht 61.0 in | Wt 128.4 lb

## 2022-11-08 DIAGNOSIS — R632 Polyphagia: Secondary | ICD-10-CM | POA: Diagnosis not present

## 2022-11-08 DIAGNOSIS — Z6824 Body mass index (BMI) 24.0-24.9, adult: Secondary | ICD-10-CM

## 2022-11-08 DIAGNOSIS — R7303 Prediabetes: Secondary | ICD-10-CM

## 2022-11-08 DIAGNOSIS — E669 Obesity, unspecified: Secondary | ICD-10-CM | POA: Diagnosis not present

## 2022-11-09 DIAGNOSIS — N3000 Acute cystitis without hematuria: Secondary | ICD-10-CM | POA: Diagnosis not present

## 2022-11-09 DIAGNOSIS — N182 Chronic kidney disease, stage 2 (mild): Secondary | ICD-10-CM | POA: Diagnosis not present

## 2022-11-09 DIAGNOSIS — R8271 Bacteriuria: Secondary | ICD-10-CM | POA: Diagnosis not present

## 2022-11-23 NOTE — Progress Notes (Unsigned)
Chief Complaint:   OBESITY [name] is here to discuss her progress with her obesity treatment plan along with follow-up of her obesity related diagnoses. [name] is on keeping a food journal and adhering to recommended goals of 1000 calories and 90 protein and states she is following her eating plan approximately 50% of the time. [name] states she is 10,000 steps daily 6 days/week.  Today's visit was #: 24 Starting weight: 190 LBS Starting date: 11/29/2021 Today's weight: 128 LBS Today's date: 11/08/2022 Total lbs lost to date: 45 LBS Total lbs lost since last in-office visit: 1 LB  Interim History: Patient without issues or concerns with meal plan.  She and her family were sick over the holidays and did not eat much due to illness for couple of days.  Did not get exercise.  Subjective:   1. Polyphagia Patient has been getting her Mounjaro from [place] [facility].  Just filled 90-day supply.  She is tolerating it well no side effects, no family history of MEN 1 or 2, or medullary thyroid cancer.  No history of pancreatitis.  2. Pre-diabetes A1c was 5.66 months ago.  Patient wondering if worsened over the past 1 month off plan eating.  Last fasting insulin was 7.7 at that time.  Assessment/Plan:  No orders of the defined types were placed in this encounter.   Medications Discontinued During This Encounter  Medication Reason   azithromycin (ZITHROMAX Z-PAK) 250 MG tablet Completed Course   Polyethylene Glycol 3350 (MIRALAX PO) Completed Course   predniSONE (DELTASONE) 20 MG tablet Completed Course     No orders of the defined types were placed in this encounter.    1. Polyphagia We will refill Mounjaro for patient in future when needed.  She pays cash and will check on price on Zepbound.  Continue PNP, journal and track intake.  Patient desires to wait 115 pounds.  I recommend she focus on health fitness rather than absolute numbers on the scale.  2. Pre-diabetes Check  fasting insulin and A1c at next office visit along with other labs with patient's NAFLD, CMP, HLD, FLP, vitamin D, for vitamin D deficiency.  We will also recheck IC since it has been greater than 1 year ago.  All labs reviewed with patient today as well as old IC results.  Visceral fat ratings, abdominal circumference etc.  Extensive counseling done.  3. Obesity with current BMI of 24.3 Patient's goal is to walk 10+ miles per day.  She prefers 13 miles it takes her 2 hours.  [employer] or resistance band training recommended for weights 3 to 5 days/week.  Gave patient bands and band it exercises handouts.  [name] is currently in the action stage of change. As such, her goal is to continue with weight loss efforts. She has agreed to keeping a food journal and adhering to recommended goals of 1000 calories and 90 protein.   Exercise goals:  As is.  Behavioral modification strategies: planning for success and keeping a strict food journal.  [name] has agreed to follow-up with our clinic in 4 weeks. She was informed of the importance of frequent follow-up visits to maximize her success with intensive lifestyle modifications for her multiple health conditions.   Objective:   Blood pressure 113/75, pulse 86, temperature 97.7 F (36.5 C), height '5\' 1"'$  (1.549 m), weight 128 lb 6.4 oz (58.2 kg), SpO2 98 %. Body mass index is 24.26 kg/m.  General: Cooperative, alert, well developed, in no acute  distress. HEENT: Conjunctivae and lids unremarkable. Cardiovascular: Regular rhythm.  Lungs: Normal work of breathing. Neurologic: No focal deficits.   Lab Results  Component Value Date   CREATININE 0.82 05/24/2022   BUN 12 05/24/2022   NA 141 05/24/2022   K 4.8 05/24/2022   CL 102 05/24/2022   CO2 25 05/24/2022   Lab Results  Component Value Date   ALT 36 (H) 05/24/2022   AST 20 05/24/2022   ALKPHOS 98 05/24/2022   BILITOT 0.4 05/24/2022   Lab Results  Component Value Date   HGBA1C 5.6  05/24/2022   HGBA1C 5.7 (H) 02/22/2022   HGBA1C 6.3 09/26/2021   Lab Results  Component Value Date   INSULIN 7.7 05/24/2022   Lab Results  Component Value Date   TSH 1.42 09/12/2022   Lab Results  Component Value Date   CHOL 189 05/24/2022   HDL 49 05/24/2022   LDLCALC 124 (H) 05/24/2022   TRIG 90 05/24/2022   CHOLHDL 3.9 05/24/2022   Lab Results  Component Value Date   VD25OH 62.0 05/24/2022   VD25OH 55.0 02/22/2022   VD25OH 30.3 11/29/2021   Lab Results  Component Value Date   WBC 6.9 11/16/2021   HGB 13.5 11/16/2021   HCT 43.2 11/16/2021   MCV 81.4 11/16/2021   PLT 327.0 11/16/2021   No results found for: "IRON", "TIBC", "FERRITIN"  Attestation Statements:   Reviewed by clinician on day of visit: allergies, medications, problem list, medical history, surgical history, family history, social history, and previous encounter notes.  I, Davy Pique, RMA, am acting as Location manager for [employer], DO.   I have reviewed the above documentation for accuracy and completeness, and I agree with the above. [name], D.O.  The [place] was signed into law in 2016 which includes the topic of electronic health records.  This provides immediate access to information in MyChart.  This includes consultation notes, operative notes, office notes, lab results and pathology reports.  If you have any questions about what you read please let us know at your next visit so we can discuss your concerns and take corrective action if need be.  We are right here with you.

## 2022-12-03 ENCOUNTER — Ambulatory Visit (INDEPENDENT_AMBULATORY_CARE_PROVIDER_SITE_OTHER): Payer: 59 | Admitting: Family Medicine

## 2022-12-03 ENCOUNTER — Encounter (INDEPENDENT_AMBULATORY_CARE_PROVIDER_SITE_OTHER): Payer: Self-pay | Admitting: Family Medicine

## 2022-12-03 VITALS — BP 90/54 | HR 83 | Temp 98.1°F | Ht 61.0 in | Wt 125.6 lb

## 2022-12-03 DIAGNOSIS — E669 Obesity, unspecified: Secondary | ICD-10-CM

## 2022-12-03 DIAGNOSIS — Z6836 Body mass index (BMI) 36.0-36.9, adult: Secondary | ICD-10-CM

## 2022-12-03 DIAGNOSIS — K529 Noninfective gastroenteritis and colitis, unspecified: Secondary | ICD-10-CM | POA: Diagnosis not present

## 2022-12-03 DIAGNOSIS — Z6823 Body mass index (BMI) 23.0-23.9, adult: Secondary | ICD-10-CM | POA: Diagnosis not present

## 2022-12-03 DIAGNOSIS — R0602 Shortness of breath: Secondary | ICD-10-CM | POA: Diagnosis not present

## 2022-12-03 DIAGNOSIS — E559 Vitamin D deficiency, unspecified: Secondary | ICD-10-CM | POA: Diagnosis not present

## 2022-12-03 DIAGNOSIS — R7303 Prediabetes: Secondary | ICD-10-CM

## 2022-12-03 MED ORDER — VITAMIN D3 25 MCG (1000 UT) PO CAPS: ORAL_CAPSULE | ORAL | 5 refills | Status: DC

## 2022-12-03 MED ORDER — PROMETHAZINE HCL 25 MG RE SUPP: 25.0000 mg | Freq: Four times a day (QID) | RECTAL | 0 refills | Status: DC | PRN

## 2022-12-04 LAB — LIPID PANEL WITH LDL/HDL RATIO
Cholesterol, Total: 243 mg/dL — ABNORMAL HIGH (ref 100–199)
HDL: 53 mg/dL (ref >39)
LDL Chol Calc (NIH): 167 mg/dL — ABNORMAL HIGH (ref 0–99)
LDL/HDL Ratio: 3.2 ratio (ref 0.0–3.2)
Triglycerides: 129 mg/dL (ref 0–149)
VLDL Cholesterol Cal: 23 mg/dL (ref 5–40)

## 2022-12-04 LAB — COMPREHENSIVE METABOLIC PANEL
ALT: 50 IU/L — ABNORMAL HIGH (ref 0–32)
AST: 30 IU/L (ref 0–40)
Albumin/Globulin Ratio: 2.1 (ref 1.2–2.2)
Albumin: 4.9 g/dL (ref 3.8–4.9)
Alkaline Phosphatase: 105 IU/L (ref 44–121)
BUN/Creatinine Ratio: 10 (ref 9–23)
BUN: 8 mg/dL (ref 6–24)
Bilirubin Total: 0.5 mg/dL (ref 0.0–1.2)
CO2: 23 mmol/L (ref 20–29)
Calcium: 10.2 mg/dL (ref 8.7–10.2)
Chloride: 102 mmol/L (ref 96–106)
Creatinine, Ser: 0.82 mg/dL (ref 0.57–1.00)
Globulin, Total: 2.3 g/dL (ref 1.5–4.5)
Glucose: 81 mg/dL (ref 70–99)
Potassium: 4.8 mmol/L (ref 3.5–5.2)
Sodium: 141 mmol/L (ref 134–144)
Total Protein: 7.2 g/dL (ref 6.0–8.5)
eGFR: 85 mL/min/{1.73_m2} (ref >59)

## 2022-12-04 LAB — HEMOGLOBIN A1C
Est. average glucose Bld gHb Est-mCnc: 111 mg/dL
Hgb A1c MFr Bld: 5.5 % (ref 4.8–5.6)

## 2022-12-04 LAB — VITAMIN D 25 HYDROXY (VIT D DEFICIENCY, FRACTURES): Vit D, 25-Hydroxy: 60.1 ng/mL (ref 30.0–100.0)

## 2022-12-04 LAB — INSULIN, RANDOM: INSULIN: 6.7 u[IU]/mL (ref 2.6–24.9)

## 2022-12-05 ENCOUNTER — Other Ambulatory Visit: Payer: Self-pay | Admitting: Gastroenterology

## 2022-12-10 ENCOUNTER — Telehealth: Payer: Self-pay | Admitting: Family Medicine

## 2022-12-10 NOTE — Telephone Encounter (Signed)
Nope thanks

## 2022-12-10 NOTE — Telephone Encounter (Signed)
Pt states her face is swollen and in pain. Dentist appt this Wednesday she would like ABX called in to [facility] [address] [place], [place], [place] [address]

## 2022-12-10 NOTE — Telephone Encounter (Signed)
I know we do not prescribe abx without visit and we do not do dental care is there anything I can recommend to her?

## 2022-12-11 NOTE — Telephone Encounter (Signed)
I called patient this morning. [facility], and had tooth extraction, started on azithromycin for infection. As needed follow up per dental provider, but recommended she contact her dentis tomorrow as they may want to follow up with her closer. No other apparent needs at this time. Option of virtual and in office visit discussed if needed.

## 2022-12-11 NOTE — Telephone Encounter (Addendum)
Wanted to ensure you saw this Dr Carlota Raspberry

## 2022-12-14 ENCOUNTER — Telehealth: Payer: Self-pay | Admitting: Gastroenterology

## 2022-12-14 MED ORDER — PANTOPRAZOLE SODIUM 40 MG PO TBEC: 40.0000 mg | DELAYED_RELEASE_TABLET | Freq: Two times a day (BID) | ORAL | 0 refills | Status: DC

## 2022-12-14 NOTE — Telephone Encounter (Signed)
Informed patient that I sent her prescription of pantoprazole to her pharmacy and to keep her upcoming appt in March. Patient verbalized understanding.

## 2022-12-14 NOTE — Telephone Encounter (Signed)
Inbound call from patient requesting refill for Prontoix. Patient was scheduled for OV with Dr. Fuller Plan on 3/5 at 10:50. Patient is requesting a call to be advised when prescription has been sent. Please advise.

## 2022-12-17 ENCOUNTER — Other Ambulatory Visit: Payer: Self-pay | Admitting: Family Medicine

## 2022-12-17 DIAGNOSIS — Z803 Family history of malignant neoplasm of breast: Secondary | ICD-10-CM

## 2022-12-17 DIAGNOSIS — R21 Rash and other nonspecific skin eruption: Secondary | ICD-10-CM

## 2022-12-19 ENCOUNTER — Ambulatory Visit: Payer: 59

## 2022-12-19 ENCOUNTER — Ambulatory Visit
Admission: RE | Admit: 2022-12-19 | Discharge: 2022-12-19 | Disposition: A | Discharge status: Home / Self Care | Payer: 59 | Source: Ambulatory Visit | Attending: Family Medicine | Admitting: Family Medicine

## 2022-12-19 DIAGNOSIS — R21 Rash and other nonspecific skin eruption: Secondary | ICD-10-CM

## 2022-12-19 DIAGNOSIS — R928 Other abnormal and inconclusive findings on diagnostic imaging of breast: Secondary | ICD-10-CM | POA: Diagnosis not present

## 2022-12-19 DIAGNOSIS — Z803 Family history of malignant neoplasm of breast: Secondary | ICD-10-CM

## 2022-12-24 NOTE — Progress Notes (Unsigned)
Chief Complaint:   OBESITY [name] is here to discuss her progress with her obesity treatment plan along with follow-up of her obesity related diagnoses. [name] is on keeping a food journal and adhering to recommended goals of 1000 calories and 90 grams of protein and states she is following her eating plan approximately 50% of the time. [name] states she is walking and lifting weights for 60 minutes 6 times per week.  Today's visit was #: 21 Starting weight: 190 lbs Starting date: 11/29/2021 Today's weight: 125 lbs Today's date: 12/03/2022 Total lbs lost to date: 65 Total lbs lost since last in-office visit: 3  Interim History: [name] is here for a follow up office visit.  We reviewed her meal plan and all questions were answered.  Patient's food recall appears to be accurate and consistent with what is on plan when she is following it.   When eating on plan, her hunger and cravings are well controlled.    Subjective:   1. Viral Gastroenteritis [name] got a GI bug from her grandchildren, and entire family had it. She had nausea, vomiting, and diarrhea for 7 days. 2 times in the past 30 days. She is out of Phenergan and she desires more.   2. Prediabetes [name] is not on medications. Denies hunger or cravings.   3. Vitamin D deficiency She is currently taking OTC vitamin D 2,000 IU each day. She denies nausea, vomiting or muscle weakness.  4. SOB (shortness of breath) on exertion [name] has a history of asthma. IC 1397, now 1181 upon recheck. Went down 220 points over the past and 1+ years, and despite 65 lbs down, still excellent.   Assessment/Plan:   Orders Placed This Encounter  Procedures   Comprehensive metabolic panel   Hemoglobin A1c   Insulin, random   Lipid Panel With LDL/HDL Ratio   VITAMIN D 25 Hydroxy (Vit-D Deficiency, Fractures)    Medications Discontinued During This Encounter  Medication Reason   Cholecalciferol (VITAMIN D3) 25 MCG (1000 UT) CAPS  Reorder     Meds ordered this encounter  Medications   promethazine (PHENERGAN) 25 MG suppository    Sig: Place 1 suppository (25 mg total) rectally every 6 (six) hours as needed for nausea or vomiting.    Dispense:  12 each    Refill:  0   Cholecalciferol (VITAMIN D3) 25 MCG (1000 UT) CAPS    Sig: Pt states she takes 2000IU qd    Dispense:  60 capsule    Refill:  5     1. Viral Gastroenteritis We will check labs today. Refill Phenergan, and only use as needed. Counseling was done. If occurs again, follow-up with PCP.   - Comprehensive metabolic panel - promethazine (PHENERGAN) 25 MG suppository; Place 1 suppository (25 mg total) rectally every 6 (six) hours as needed for nausea or vomiting.  Dispense: 12 each; Refill: 0  2. Prediabetes We will check labs today. [name] will continue Mounjaro.   - Hemoglobin A1c - Insulin, random - Lipid Panel With LDL/HDL Ratio  3. Vitamin D deficiency We will check labs today. Low Vitamin D level contributes to fatigue and are associated with obesity, breast, and colon cancer. She agrees to continue to take Vitamin D and will follow-up for routine testing of Vitamin D, at least 2-3 times per year to avoid over-replacement.  - VITAMIN D 25 Hydroxy (Vit-D Deficiency, Fractures) - Cholecalciferol (VITAMIN D3) 25 MCG (1000 UT) CAPS; Pt states she takes 2000IU  qd  Dispense: 60 capsule; Refill: 5  4. SOB (shortness of breath) on exertion Need to decrease intake slightly to [phone] calories with 80+ grams of protein.   5. Obesity with current BMI of 23.7 Continue Mounjaro 15 mg weekly. Declines the need for a refill at this time. I recommended the patient to focus on weight maintenance and no weight loss at this time. Continue prudent nutritional plan and exercise.   [name] is currently in the action stage of change. As such, her goal is to maintain weight for now. She has agreed to change to keeping a food journal and adhering to recommended goals of  [phone] calories and 80+ grams of protein daily.   Exercise goals: As is.   Behavioral modification strategies: increasing lean protein intake, decreasing simple carbohydrates, and planning for success.  [name] has agreed to follow-up with our clinic in 2 weeks. She was informed of the importance of frequent follow-up visits to maximize her success with intensive lifestyle modifications for her multiple health conditions.   [name] was informed we would discuss her lab results at her next visit unless there is a critical issue that needs to be addressed sooner. [name] agreed to keep her next visit at the agreed upon time to discuss these results.  Objective:   Blood pressure (!) 90/54, pulse 83, temperature 98.1 F (36.7 C), height 5' 1"$  (1.549 m), weight 125 lb 9.6 oz (57 kg), SpO2 94 %. Body mass index is 23.73 kg/m.  General: Cooperative, alert, well developed, in no acute distress. HEENT: Conjunctivae and lids unremarkable. Cardiovascular: Regular rhythm.  Lungs: Normal work of breathing. Neurologic: No focal deficits.   Lab Results  Component Value Date   CREATININE 0.82 12/03/2022   BUN 8 12/03/2022   NA 141 12/03/2022   K 4.8 12/03/2022   CL 102 12/03/2022   CO2 23 12/03/2022   Lab Results  Component Value Date   ALT 50 (H) 12/03/2022   AST 30 12/03/2022   ALKPHOS 105 12/03/2022   BILITOT 0.5 12/03/2022   Lab Results  Component Value Date   HGBA1C 5.5 12/03/2022   HGBA1C 5.6 05/24/2022   HGBA1C 5.7 (H) 02/22/2022   HGBA1C 6.3 09/26/2021   Lab Results  Component Value Date   INSULIN 6.7 12/03/2022   INSULIN 7.7 05/24/2022   Lab Results  Component Value Date   TSH 1.42 09/12/2022   Lab Results  Component Value Date   CHOL 243 (H) 12/03/2022   HDL 53 12/03/2022   LDLCALC 167 (H) 12/03/2022   TRIG 129 12/03/2022   CHOLHDL 3.9 05/24/2022   Lab Results  Component Value Date   VD25OH 60.1 12/03/2022   VD25OH 62.0 05/24/2022   VD25OH 55.0 02/22/2022   Lab  Results  Component Value Date   WBC 6.9 11/16/2021   HGB 13.5 11/16/2021   HCT 43.2 11/16/2021   MCV 81.4 11/16/2021   PLT 327.0 11/16/2021   No results found for: "IRON", "TIBC", "FERRITIN"  Attestation Statements:   Reviewed by clinician on day of visit: allergies, medications, problem list, medical history, surgical history, family history, social history, and previous encounter notes.   Wilhemena Durie, am acting as transcriptionist for [employer], DO.   I have reviewed the above documentation for accuracy and completeness, and I agree with the above. [name], D.O.  The [place] was signed into law in 2016 which includes the topic of electronic health records.  This provides immediate access to  information in [place].  This includes consultation notes, operative notes, office notes, lab results and pathology reports.  If you have any questions about what you read please let us know at your next visit so we can discuss your concerns and take corrective action if need be.  We are right here with you.

## 2023-01-08 ENCOUNTER — Ambulatory Visit: Payer: 59 | Admitting: Gastroenterology

## 2023-01-08 ENCOUNTER — Encounter (INDEPENDENT_AMBULATORY_CARE_PROVIDER_SITE_OTHER): Payer: Self-pay | Admitting: Family Medicine

## 2023-01-08 ENCOUNTER — Encounter: Payer: Self-pay | Admitting: Gastroenterology

## 2023-01-08 ENCOUNTER — Ambulatory Visit (INDEPENDENT_AMBULATORY_CARE_PROVIDER_SITE_OTHER): Payer: 59 | Admitting: Family Medicine

## 2023-01-08 VITALS — BP 90/60 | HR 64 | Ht 61.0 in | Wt 130.8 lb

## 2023-01-08 VITALS — BP 115/75 | HR 80 | Temp 98.4°F | Ht 61.0 in | Wt 127.6 lb

## 2023-01-08 DIAGNOSIS — E7849 Other hyperlipidemia: Secondary | ICD-10-CM | POA: Diagnosis not present

## 2023-01-08 DIAGNOSIS — Z800 Family history of malignant neoplasm of digestive organs: Secondary | ICD-10-CM

## 2023-01-08 DIAGNOSIS — R7303 Prediabetes: Secondary | ICD-10-CM | POA: Diagnosis not present

## 2023-01-08 DIAGNOSIS — K5900 Constipation, unspecified: Secondary | ICD-10-CM

## 2023-01-08 DIAGNOSIS — Z83719 Family history of colon polyps, unspecified: Secondary | ICD-10-CM | POA: Diagnosis not present

## 2023-01-08 DIAGNOSIS — K219 Gastro-esophageal reflux disease without esophagitis: Secondary | ICD-10-CM | POA: Diagnosis not present

## 2023-01-08 DIAGNOSIS — K760 Fatty (change of) liver, not elsewhere classified: Secondary | ICD-10-CM | POA: Diagnosis not present

## 2023-01-08 DIAGNOSIS — E559 Vitamin D deficiency, unspecified: Secondary | ICD-10-CM | POA: Diagnosis not present

## 2023-01-08 DIAGNOSIS — K529 Noninfective gastroenteritis and colitis, unspecified: Secondary | ICD-10-CM

## 2023-01-08 DIAGNOSIS — Z6824 Body mass index (BMI) 24.0-24.9, adult: Secondary | ICD-10-CM | POA: Diagnosis not present

## 2023-01-08 DIAGNOSIS — E7841 Elevated Lipoprotein(a): Secondary | ICD-10-CM | POA: Insufficient documentation

## 2023-01-08 MED ORDER — PANTOPRAZOLE SODIUM 40 MG PO TBEC: 40.0000 mg | DELAYED_RELEASE_TABLET | Freq: Two times a day (BID) | ORAL | 11 refills | Status: DC

## 2023-01-08 MED ORDER — PANTOPRAZOLE SODIUM 40 MG PO TBEC: 40.0000 mg | DELAYED_RELEASE_TABLET | Freq: Every day | ORAL | 11 refills | Status: DC

## 2023-01-08 MED ORDER — PROMETHAZINE HCL 25 MG RE SUPP: 25.0000 mg | Freq: Four times a day (QID) | RECTAL | 0 refills | Status: DC | PRN

## 2023-01-08 NOTE — Progress Notes (Signed)
    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 If not effective then try Miralax daily  REV 1 year   HPI    This is a 52 year old female returning for follow-up of GERD, constipation, FHCC, FHCP.  She states her reflux symptoms are under good control on pantoprazole daily.  She has intermittent breakthrough symptoms that are effectively treated with a second dose of pantoprazole.  She has a strong family history of colon cancer and her sisters have all had colon polyps.  We discussed a 2-year interval for her next colonoscopy which is reasonable. She has ongoing constipation that has not changed.    Labs / Imaging       Latest Ref Rng & Units 12/03/2022    9:10 AM 05/24/2022   10:34 AM 02/22/2022   10:59 AM  Hepatic Function  Total Protein 6.0 - 8.5 g/dL 7.2  6.9  7.1   Albumin 3.8 - 4.9 g/dL 4.9  4.6  4.8   AST 0 - 40 IU/L '30  20  30   '$ ALT 0 - 32 IU/L 50  36  60   Alk Phosphatase 44 - 121 IU/L 105  98  105   Total Bilirubin 0.0 - 1.2 mg/dL 0.5  0.4  0.4        Latest Ref Rng & Units 11/16/2021    8:21 AM 11/05/2019    1:15 AM 11/19/2017    9:43 AM  CBC  WBC 4.0 - 10.5 K/uL 6.9  11.2  10.7   Hemoglobin 12.0 - 15.0 g/dL 13.5  13.2  13.7   Hematocrit 36.0 - 46.0 % 43.2  42.2  42.9   Platelets 150.0 - 400.0 K/uL 327.0  349  381.0    Current Medications, Allergies,  Past Medical History, Past Surgical History, Family History and Social History were reviewed in [employer] record.   Physical Exam: General: Well developed, well nourished, no acute distress Head: Normocephalic and atraumatic Eyes: Sclerae anicteric, EOMI Ears: Normal auditory acuity Mouth: No deformities or lesions noted Lungs: Clear throughout to auscultation Heart: Regular rate and rhythm; No murmurs, rubs or bruits Abdomen: Soft, non tender and non distended. No masses, hepatosplenomegaly or hernias noted. Normal Bowel sounds Rectal: Not done Musculoskeletal: Symmetrical with no gross deformities  Pulses:  Normal pulses noted Extremities: No edema or deformities noted Neurological: Alert oriented x 4, grossly nonfocal Psychological:  Alert and cooperative. Normal mood and affect   Lukka Black T. Fuller Plan, MD 01/08/2023, 11:10 AM

## 2023-01-08 NOTE — Patient Instructions (Signed)
The 10-year ASCVD risk score (Arnett DK, et al., 2019) is: 3.6%   Values used to calculate the score:     Age: 52 years     Sex: Female     Is Non-Hispanic African American: No     Diabetic: Yes     Tobacco smoker: No     Systolic Blood Pressure: [id number] mmHg     Is BP treated: No     HDL Cholesterol: 53 mg/dL     Total Cholesterol: 243 mg/dL

## 2023-01-08 NOTE — Patient Instructions (Signed)
We have sent the following medications to your pharmacy for you to pick up at your convenience: pantoprazole.   The [facility] providers would like to encourage you to use [facility] to communicate with providers for non-urgent requests or questions.  Due to long hold times on the telephone, sending your provider a message by [facility] may be a faster and more efficient way to get a response.  Please allow 48 business hours for a response.  Please remember that this is for non-urgent requests.   Thank you for choosing me and [facility].  Pricilla Riffle. Dagoberto Ligas., MD., Marval Regal

## 2023-01-08 NOTE — Progress Notes (Signed)
Office: [phone]  /  Fax: [phone]  WEIGHT SUMMARY AND BIOMETRICS  Weight Lost Since Last Visit: +2  No data recorded  Vitals Temp: 98.4 F (36.9 C) BP: 115/75 Pulse Rate: 80 SpO2: 96 %   Anthropometric Measurements Height: '5\' 1"'$  (1.549 m) Weight: 127 lb 9.6 oz (57.9 kg) BMI (Calculated): 24.12 Weight at Last Visit: 125lb Weight Lost Since Last Visit: +2 Starting Weight: 190lb Total Weight Loss (lbs): 65 lb (29.5 kg) Peak Weight: 200lb   Body Composition  Body Fat %: 32.3 % Fat Mass (lbs): 41.2 lbs Muscle Mass (lbs): 82 lbs Total Body Water (lbs): 59.6 lbs Visceral Fat Rating : 6   Other Clinical Data Fasting: no Labs: no Today's Visit #: 22 Starting Date: 11/29/21     HPI  Chief Complaint: OBESITY  [name] is here to discuss her progress with her obesity treatment plan. She is on the keeping a food journal and adhering to recommended goals of [phone] calories and 80+ protein and states she is following her eating plan approximately 100 % of the time. She states she is exercising 2-3 hours x6 days per week.   Interval History:  Since last office visit she states that she is still exercising 2-3 hours per day with walking and weight lifting.  She is currently using Mounjaro '15mg'$  weekly for medical weight loss. She denies side effects. However, she has been out of the [facility] for 2 weeks due to issues with Mounjaro's coupons. Her cravings and appetite have been good despite being off the medication. However, she is disappointed that she ate a full burger today as opposed to the usual 1/2 that she eats to feel satiated.   She is still using my fitness pal.   Review of Systems: Pertinent positives were addressed with patient today.   PHYSICAL EXAM:  Blood pressure 115/75, pulse 80, temperature 98.4 F (36.9 C), height '5\' 1"'$  (1.549 m), weight 127 lb 9.6 oz (57.9 kg), SpO2 96 %. Body mass index is 24.11 kg/m.  General: Well Developed, well  nourished, and in no acute distress.  HEENT: Normocephalic, atraumatic Skin: Warm and dry, cap RF less 2 sec, good turgor Chest:  Normal excursion, shape, no gross abn Respiratory: speaking in full sentences, no conversational dyspnea NeuroM-Sk: Ambulates w/o assistance, moves * 4 Psych: A and O *3, insight good, mood-full   DIAGNOSTIC DATA REVIEWED:  BMET    Component Value Date/Time   NA 141 12/03/2022 0910   K 4.8 12/03/2022 0910   CL 102 12/03/2022 0910   CO2 23 12/03/2022 0910   GLUCOSE 81 12/03/2022 0910   GLUCOSE 83 01/02/2022 1216   BUN 8 12/03/2022 0910   CREATININE 0.82 12/03/2022 0910   CALCIUM 10.2 12/03/2022 0910   GFRNONAA >60 11/05/2019 0115   GFRAA >60 11/05/2019 0115   Lab Results  Component Value Date   HGBA1C 5.5 12/03/2022   HGBA1C 6.3 09/26/2021   Lab Results  Component Value Date   INSULIN 6.7 12/03/2022   INSULIN 7.7 05/24/2022   Lab Results  Component Value Date   TSH 1.42 09/12/2022   CBC    Component Value Date/Time   WBC 6.9 11/16/2021 0821   RBC 5.31 (H) 11/16/2021 0821   HGB 13.5 11/16/2021 0821   HCT 43.2 11/16/2021 0821   PLT 327.0 11/16/2021 0821   MCV 81.4 11/16/2021 0821   MCH 26.6 11/05/2019 0115   MCHC 31.3 11/16/2021 0821   RDW 15.3 11/16/2021 0821   Iron Studies  No results found for: "IRON", "TIBC", "FERRITIN", "IRONPCTSAT" Lipid Panel     Component Value Date/Time   CHOL 243 (H) 12/03/2022 0910   TRIG 129 12/03/2022 0910   HDL 53 12/03/2022 0910   CHOLHDL 3.9 05/24/2022 1034   CHOLHDL 4 09/26/2021 0759   VLDL 27.0 09/26/2021 0759   LDLCALC 167 (H) 12/03/2022 0910   Hepatic Function Panel     Component Value Date/Time   PROT 7.2 12/03/2022 0910   ALBUMIN 4.9 12/03/2022 0910   AST 30 12/03/2022 0910   ALT 50 (H) 12/03/2022 0910   ALKPHOS 105 12/03/2022 0910   BILITOT 0.5 12/03/2022 0910   BILIDIR 0.1 04/20/2008 1145      Component Value Date/Time   TSH 1.42 09/12/2022 0933   Nutritional Lab Results   Component Value Date   VD25OH 60.1 12/03/2022   VD25OH 62.0 05/24/2022   VD25OH 55.0 02/22/2022     ASSESSMENT AND PLAN  No orders of the defined types were placed in this encounter.   There are no discontinued medications.   No orders of the defined types were placed in this encounter.     TREATMENT PLAN FOR OBESITY:  Recommended Dietary Goals  [name] is currently in the action stage of change. As such, her goal is to continue weight management plan. She has agreed to keeping a food journal and adhering to recommended goals of [phone] calories and 80+ protein.  Behavioral Intervention  We discussed the following Behavioral Modification Strategies today: increasing lean protein intake, increasing vegetables, avoiding skipping meals, and work on tracking and journaling calories using tracking App.  Additional resources provided today: N/A  Recommended Physical Activity Goals  [name] has been advised to work up to 150 minutes of moderate intensity aerobic activity a week and strengthening exercises 2-3 times per week for cardiovascular health, weight loss maintenance and preservation of muscle mass.   She has agreed to increase physical activity in their day and reduce sedentary time (increase NEAT).  and continue physical activity as is.    ASSOCIATED CONDITIONS ADDRESSED TODAY  Morbid obesity (HCC)-start bmi 35.90/date 11/29/21 Assessment: Condition is Stable. Labs were reviewed. Biometric data collected today, was reviewed with patient.  Fat mass was 38.6 and is now at 41.2 fat. Plan:  Viral Gastroenteritis Assessment: Condition is  stable . Symptoms have resolved. Plan: Patient requests Phenergan suppository to have available to her if she needs it again due to frequent grandchildren exposures with x3 episodes of GI symptoms in the last few months.  Prediabetes Assessment: Condition is Stable. Labs were reviewed. Last A1c was 5.5 on 12/03/22. She is having difficulty  getting Mounjaro as the coupons are not available due to a cyber hack. Plan: Continue Mounjaro '15mg'$  weekly. She plans to continue working on getting the coupon.  Vitamin D deficiency Assessment: Condition is Stable. Labs were reviewed. Vitamin D was 60.1 on 12/03/22. Plan: Continue Vitamin D3 2000 IU qd.  Other Hyperlipidemia Assessment: Condition is Worsening. Labs were reviewed. Last total cholesterol was 243, HDL was 53, LDL was 167, and triglycerides were 129 on 12/03/22. Plan: Advised to decrease fatty food intake and reviewed how this impacts her cholesterol. ASCVD risk score is 3.6%. She would like to remain off of cholesterol and triglyceride lowering medications at this time.  NAFLD Assessment: Condition is Worsening. Labs were reviewed. ALT has worsened to 50. She reports being told by PCP and GI in the past that she has fatty liver disease. She does not drink alcohol.  Plan: Advised to decrease fatty food intake and reviewed how this impacts her liver.   No follow-ups on file.Marland Kitchen She was informed of the importance of frequent follow up visits to maximize her success with intensive lifestyle modifications for her multiple health conditions.   ATTESTASTION STATEMENTS:  Reviewed by clinician on day of visit: allergies, medications, problem list, medical history, surgical history, family history, social history, and previous encounter notes.   Time spent on visit including pre-visit chart review and post-visit care and charting was 35 minutes.    I,Alexis Herring,acting as a Education administrator for [employer], DO.,have documented all relevant documentation on the behalf of Mellody Dance, DO,as directed by  Mellody Dance, DO while in the presence of Mellody Dance, DO.   I, Mellody Dance, DO, have reviewed all documentation for this visit. The documentation on 01/08/23 for the exam, diagnosis, procedures, and orders are all accurate and complete.     [employer]

## 2023-01-29 ENCOUNTER — Ambulatory Visit (INDEPENDENT_AMBULATORY_CARE_PROVIDER_SITE_OTHER): Payer: 59 | Admitting: Family Medicine

## 2023-01-29 ENCOUNTER — Encounter (INDEPENDENT_AMBULATORY_CARE_PROVIDER_SITE_OTHER): Payer: Self-pay | Admitting: Family Medicine

## 2023-01-29 VITALS — BP 114/72 | HR 88 | Temp 98.4°F | Ht 61.0 in | Wt 128.4 lb

## 2023-01-29 DIAGNOSIS — R7303 Prediabetes: Secondary | ICD-10-CM | POA: Diagnosis not present

## 2023-01-29 DIAGNOSIS — E559 Vitamin D deficiency, unspecified: Secondary | ICD-10-CM | POA: Diagnosis not present

## 2023-01-29 DIAGNOSIS — Z6824 Body mass index (BMI) 24.0-24.9, adult: Secondary | ICD-10-CM

## 2023-01-29 NOTE — Progress Notes (Signed)
[name], D.O.  ABFM, ABOM Specializing in Clinical Bariatric Medicine  Office located at: [address], [place]  [address]     Assessment and Plan:   Prediabetes Assessment: Condition is Controlled.  Lab Results  Component Value Date   HGBA1C 5.5 12/03/2022   HGBA1C 5.6 05/24/2022   HGBA1C 5.7 (H) 02/22/2022   INSULIN 6.7 12/03/2022   INSULIN 7.7 05/24/2022  She had been off  Mounjaro 15 mg weekly for 1 month because she was unable to obtain it, but she restarted it 2 weeks ago. Compliance good, cravings and hunger are well controlled.   Plan: Continue with Mounjaro 15 mg weekly.  - [name] will continue to work on weight loss, exercise, via their meal plan we devised to help decrease the risk of progressing to diabetes.    Vitamin D deficiency Assessment: Condition is At goal.  Lab Results  Component Value Date   VD25OH 60.1 12/03/2022   VD25OH 62.0 05/24/2022   VD25OH 55.0 02/22/2022  She reports good compliance and tolerance with OTC Vitamin D3 2K IU daily.   Plan: Continue with OTC Vitamin D3 2K IU.  - weight loss will likely improve availability of vitamin D, thus encouraged [name] to continue with meal plan and their weight loss efforts to further improve this condition.  Thus, we will need to monitor levels regularly (every 3-4 mo on average) to keep levels within normal limits and prevent over supplementation.   TREATMENT PLAN FOR OBESITY: BMI 24.0-24.9, adult-current 24.3 Morbid obesity (HCC)-start bmi 35.90/date 11/29/21 Assessment: Condition is improving. Biometric data collected today, was reviewed with patient.  Fat mass has decreased by 1lb. Muscle mass has increased by 6.2lb. Total body water has decreased by 1.2lb.   Plan: Continue with  Journaling [phone] calories with 80+ of protein. I also recommended her to get in all her protein because she is exercising frequently.   Behavioral Intervention Additional resources provided  today: patient declined Evidence-based interventions for health behavior change were utilized today including the discussion of self monitoring techniques, problem-solving barriers and SMART goal setting techniques.   Regarding patient's less desirable eating habits and patterns, we employed the technique of small changes.  Pt will specifically work on: continuing with journaling and hitting protein goals for next visit.    Recommended Physical Activity Goals Wynn has been advised to work up to 150 minutes of moderate intensity aerobic activity a week and strengthening exercises 2-3 times per week for cardiovascular health, weight loss maintenance and preservation of muscle mass.  She has agreed to Continue current level of physical activity    FOLLOW UP: Return in about 4 weeks (around 02/26/2023). She was informed of the importance of frequent follow up visits to maximize her success with intensive lifestyle modifications for her multiple health conditions.  Subjective:   Chief complaint: Obesity [name] is here to discuss her progress with her obesity treatment plan. She is on the Journaling [phone] calories with 80+ of protein. She states she is following her eating plan approximately 100% of the time. She states she is exercising 120 minutes 5 days per week.  Interval History:  [name] is here for a follow up office visit.Since the last office visit she has been mostly following her meal plan. She is also exercising and lifting very frequently.  We reviewed her meal plan and all questions were answered. Patient's food recall appears to be accurate and consistent with what is on plan when  she is following it. When eating on plan, her hunger and cravings are well controlled.     Pharmacotherapy for weight loss: She is currently taking  Mounjaro  for medical weight loss.  Denies side effects.    Review of Systems:  Pertinent positives were addressed with patient  today.  Weight Summary and Biometrics   Weight Lost Since Last Visit: 0  Weight Gained Since Last Visit: 1lb   Vitals Temp: 98.4 F (36.9 C) BP: 114/72 Pulse Rate: 88 SpO2: 95 %   Anthropometric Measurements Height: 5\' 1"  (1.549 m) Weight: 128 lb 6.4 oz (58.2 kg) BMI (Calculated): 24.27 Weight at Last Visit: 127lb Weight Lost Since Last Visit: 0 Weight Gained Since Last Visit: 1lb Starting Weight: 190lb Total Weight Loss (lbs): 64 lb (29 kg) Peak Weight: 200lb   Body Composition  Body Fat %: 31.3 % Fat Mass (lbs): 40.2 lbs Muscle Mass (lbs): 88.2 lbs Total Body Water (lbs): 58.4 lbs Visceral Fat Rating : 6   Other Clinical Data Fasting: no Labs: no Today's Visit #: 23 Starting Date: 11/29/21    Objective:   PHYSICAL EXAM:  Blood pressure 114/72, pulse 88, temperature 98.4 F (36.9 C), height 5\' 1"  (1.549 m), weight 128 lb 6.4 oz (58.2 kg), SpO2 95 %. Body mass index is 24.26 kg/m.  General: Well Developed, well nourished, and in no acute distress.  HEENT: Normocephalic, atraumatic Skin: Warm and dry, cap RF less 2 sec, good turgor Chest:  Normal excursion, shape, no gross abn Respiratory: speaking in full sentences, no conversational dyspnea NeuroM-Sk: Ambulates w/o assistance, moves * 4 Psych: A and O *3, insight good, mood-full  DIAGNOSTIC DATA REVIEWED:  BMET    Component Value Date/Time   NA 141 12/03/2022 0910   K 4.8 12/03/2022 0910   CL 102 12/03/2022 0910   CO2 23 12/03/2022 0910   GLUCOSE 81 12/03/2022 0910   GLUCOSE 83 01/02/2022 1216   BUN 8 12/03/2022 0910   CREATININE 0.82 12/03/2022 0910   CALCIUM 10.2 12/03/2022 0910   GFRNONAA >60 11/05/2019 0115   GFRAA >60 11/05/2019 0115   Lab Results  Component Value Date   HGBA1C 5.5 12/03/2022   HGBA1C 6.3 09/26/2021   Lab Results  Component Value Date   INSULIN 6.7 12/03/2022   INSULIN 7.7 05/24/2022   Lab Results  Component Value Date   TSH 1.42 09/12/2022   CBC     Component Value Date/Time   WBC 6.9 11/16/2021 0821   RBC 5.31 (H) 11/16/2021 0821   HGB 13.5 11/16/2021 0821   HCT 43.2 11/16/2021 0821   PLT 327.0 11/16/2021 0821   MCV 81.4 11/16/2021 0821   MCH 26.6 11/05/2019 0115   MCHC 31.3 11/16/2021 0821   RDW 15.3 11/16/2021 0821   Iron Studies No results found for: "IRON", "TIBC", "FERRITIN", "IRONPCTSAT" Lipid Panel     Component Value Date/Time   CHOL 243 (H) 12/03/2022 0910   TRIG 129 12/03/2022 0910   HDL 53 12/03/2022 0910   CHOLHDL 3.9 05/24/2022 1034   CHOLHDL 4 09/26/2021 0759   VLDL 27.0 09/26/2021 0759   LDLCALC 167 (H) 12/03/2022 0910   Hepatic Function Panel     Component Value Date/Time   PROT 7.2 12/03/2022 0910   ALBUMIN 4.9 12/03/2022 0910   AST 30 12/03/2022 0910   ALT 50 (H) 12/03/2022 0910   ALKPHOS 105 12/03/2022 0910   BILITOT 0.5 12/03/2022 0910   BILIDIR 0.1 04/20/2008 1145  Component Value Date/Time   TSH 1.42 09/12/2022 0933   Nutritional Lab Results  Component Value Date   VD25OH 60.1 12/03/2022   VD25OH 62.0 05/24/2022   VD25OH 55.0 02/22/2022    Attestations:   Reviewed by clinician on day of visit: allergies, medications, problem list, medical history, surgical history, family history, social history, and previous encounter notes.  This encounter took 40 total minutes of time including any pre-visit and post-visit time spent on this date of service only, including taking a thorough history, reviewing any labs and/or imaging, reviewing prior notes, counseling the patient, coordinating care as well as documenting in the electronic health record on the date of service.   I,Special Puri,acting as a Education administrator for [employer], DO.,have documented all relevant documentation on the behalf of Mellody Dance, DO,as directed by  Mellody Dance, DO while in the presence of Mellody Dance, DO.   I, Mellody Dance, DO, have reviewed all documentation for this visit. The documentation on  01/29/23 for the exam, diagnosis, procedures, and orders are all accurate and complete.

## 2023-01-31 ENCOUNTER — Ambulatory Visit (INDEPENDENT_AMBULATORY_CARE_PROVIDER_SITE_OTHER): Payer: 59 | Admitting: Adult Health

## 2023-02-20 ENCOUNTER — Encounter: Payer: Self-pay | Admitting: Internal Medicine

## 2023-02-20 ENCOUNTER — Ambulatory Visit: Payer: 59 | Admitting: Internal Medicine

## 2023-02-20 VITALS — BP 120/80 | HR 65 | Ht 61.0 in | Wt 131.0 lb

## 2023-02-20 DIAGNOSIS — E0500 Thyrotoxicosis with diffuse goiter without thyrotoxic crisis or storm: Secondary | ICD-10-CM | POA: Diagnosis not present

## 2023-02-20 DIAGNOSIS — E0590 Thyrotoxicosis, unspecified without thyrotoxic crisis or storm: Secondary | ICD-10-CM

## 2023-02-20 DIAGNOSIS — E042 Nontoxic multinodular goiter: Secondary | ICD-10-CM | POA: Diagnosis not present

## 2023-02-20 LAB — TSH: TSH: 1.21 u[IU]/mL (ref 0.35–5.50)

## 2023-02-20 LAB — T4, FREE: Free T4: 0.89 ng/dL (ref 0.60–1.60)

## 2023-02-20 MED ORDER — METHIMAZOLE 5 MG PO TABS: 2.5000 mg | ORAL_TABLET | ORAL | 3 refills | Status: DC

## 2023-02-20 NOTE — Progress Notes (Signed)
Name: [name]  MRN/ DOB: [id number], [birth year]    Age/ Sex: 53 y.o., female     PCP: Shade Flood, MD   Reason for Endocrinology Evaluation: Hyperthyroidism     Initial Endocrinology Clinic Visit: 10/19/2021    PATIENT IDENTIFIER: [name] is a 52 y.o., female with a past medical history of hyperthyroidism, Asthma and Hx of right nephrectomy for benign reasons. She has followed with [facility] since 10/19/2021 for consultative assistance with management of her hyperthyroidism.   HISTORICAL SUMMARY:  She has been noted with suppressed TSh at 0.00 uIU/mL in 09/2021 ( no concommitent FT4at the time) Repeat testing confirmed a TSH 0.01 uIU/mL with normal FT4 at 0.8 ng/dL and normal FT3 at 4.2 pg/mL    She was also found to have slight elevation of Anti-TPO Abs at 16 IU/mL  TRAB slightly elevated at 2.66 IU/L   She was started on methimazole 10/2021   Denies radiation exposure   Thyroid ultrasound 10/20/2021 showed multinodular goiter   Mother and sister with thyroid disease    SUBJECTIVE:    Today (02/20/2023):  [name] is here for hyperthyroidism.    Weight has been fluctuating  No local neck enlargement  Denies palpations Has occasional  constipation but no diarrhea  Denies tremors  No anxiety  Continues with  dry eyes , sees ophthalmology next week    Methimazole 5 mg, half a tablet four days a week  ( Monday Wednesday, Friday and Sunday )    HISTORY:  Past Medical History:  Past Medical History:  Diagnosis Date   Arthritis    Asthma    Complication of anesthesia    respirations slow down after coming out from Anesthesia   Endometriosis    GERD (gastroesophageal reflux disease)    H/O right nephrectomy    Mass of right kidney    Other fatigue    Palpitations    Post-operative nausea and vomiting    Shortness of breath on exertion    Past Surgical History:  Past Surgical History:   Procedure Laterality Date   CERVICAL DISC SURGERY     CESAREAN SECTION     x3 [phone])   CHOLECYSTECTOMY     COLONOSCOPY  12/17/2017   Dr.Stark   EUS N/A 03/14/2017   Procedure: UPPER ENDOSCOPIC ULTRASOUND (EUS) LINEAR;  Surgeon: Rachael Fee, MD;  Location: [facility];  Service: Endoscopy;  Laterality: N/A;   I & D EXTREMITY Left 05/26/2021   Procedure: IRRIGATION AND DEBRIDEMENT EXTREMITY WITH COMPLEX CLOSURE OF WOUND;  Surgeon: Cammy Copa, MD;  Location: [facility] OR;  Service: Orthopedics;  Laterality: Left;   KNEE ARTHROSCOPY Left 05/26/2021   Procedure: ARTHROSCOPIC KNEE WASHOUT;  Surgeon: Cammy Copa, MD;  Location: [name] OR;  Service: Orthopedics;  Laterality: Left;   LAPAROSCOPIC ABDOMINAL EXPLORATION N/A 05/17/2017   Procedure: LAPAROSCOPIC POSSIBLE OPEN REMOVAL OF STOMACH MASS;  Surgeon: Avel Peace, MD;  Location: [facility] ORS;  Service: General;  Laterality: N/A;   LAPAROSCOPIC ASSISTED VAGINAL HYSTERECTOMY  2003   LAPAROSCOPIC NEPHRECTOMY Right 05/17/2017   Procedure: RIGHT RADICAL NEPHRECTOMY, URETERECTOMY;  Surgeon: Crist Fat, MD;  Location: [facility];  Service: Urology;  Laterality: Right;   TOTAL ABDOMINAL HYSTERECTOMY     tubal preganancy     Social History:  reports that she has never smoked. She has never used smokeless tobacco. She reports that she does not drink alcohol and does not use drugs. Family History:  Family History  Problem Relation Age of Onset   Alcoholism Mother    Thyroid disease Mother    Stroke Mother    Hypertension Mother    Cancer Mother 54       "back cancer"   Other Mother        hysterectomy at 59 for "pre-cancerous cervical cells"   Obesity Father    Cancer Father    Hypertension Father    Colon cancer Father 39   Cervical cancer Sister        full sister dx. unspecified age   Colon polyps Sister        unspecified number   Colon polyps Sister        paternal half-sister w/ colon polyps dx. age 20 or  younger - unspecified number   Colon cancer Sister 7       paternal half-sister    Colon polyps Sister        paternal half-sister; unspecified number   Colon cancer Sister        paternal half-sister dx. before age 18; has had two colon cancers   Breast cancer Sister        paternal half-sister dx under age 75   Renal cancer Sister 66       paternal half-sister dx. w/ "LRC - lymphoid renal cancer"   Cancer Sister 21       paternal half-sister dx cancer of wrist that was not a skin cancer   Breast cancer Maternal Grandmother        unspecified age   Diabetes Maternal Grandmother    Diabetes Paternal Grandfather    Colon polyps Son 48       has had a "bleeding polyp"   Colon cancer Paternal Aunt        (x2) paternal aunts with colon cancer at unspecified ages; lim info   Breast cancer Paternal Aunt        (x3) paternal aunts with breast cancer at unspecified ages; lim info   Colon cancer Paternal Uncle        dx. unspecified age; lim info   Cervical cancer Other 68       niece; +hysterectomy   Alcoholism Other    Obesity Other    Esophageal cancer Neg Hx    Stomach cancer Neg Hx      HOME MEDICATIONS: Allergies as of 02/20/2023       Reactions   Amoxicillin Anaphylaxis   All "cillins" pt was 2-23 years old  Has patient had a PCN reaction causing immediate rash, facial/tongue/throat swelling, SOB or lightheadedness with hypotension: yes Has patient had a PCN reaction causing severe rash involving mucus membranes or skin necrosis: unknown Has patient had a PCN reaction that required hospitalization yes Has patient had a PCN reaction occurring within the last 10 years:no If all of the above answers are "NO", then may proceed with Cephalosporin use.   Ibuprofen Anaphylaxis, Hives   Keflex [cephalexin] Anaphylaxis   Penicillins Anaphylaxis   Has patient had a PCN reaction causing immediate rash, facial/tongue/throat swelling, SOB or lightheadedness with hypotension:yes Has  patient had a PCN reaction causing severe rash involving mucus membranes or skin necrosis: unknown Has patient had a PCN reaction that required hospitalization yes  Has patient had a PCN reaction occurring within the last 10 years: no If all of the above answers are "NO", then may proceed with Cephalosporin use.   Baby Powder [methylbenzethonium] Hives   Doxycycline Diarrhea, Rash  Medication List        Accurate as of February 20, 2023 11:13 AM. If you have any questions, ask your nurse or doctor.          STOP taking these medications    benzonatate 200 MG capsule Commonly known as: TESSALON Stopped by: Scarlette Shorts, MD       TAKE these medications    albuterol 108 (90 Base) MCG/ACT inhaler Commonly known as: Proventil HFA Inhale 2 puffs into the lungs every 6 (six) hours as needed for wheezing or shortness of breath.   Cequa 0.09 % Soln Generic drug: cycloSPORINE (PF) Apply 1 drop to eye 2 (two) times daily.   chlorpheniramine-HYDROcodone 10-8 MG/5ML Commonly known as: TUSSIONEX Take 5 mLs by mouth every 12 (twelve) hours as needed for cough.   docusate 60 MG/15ML syrup Commonly known as: COLACE Take 60 mg by mouth daily.   furosemide 20 MG tablet Commonly known as: LASIX Take 0.5 tablets (10 mg total) by mouth daily.   methimazole 5 MG tablet Commonly known as: TAPAZOLE Take 0.5 tablets (2.5 mg total) by mouth as directed. Half a tablet 4 days a week   Mounjaro 15 MG/0.5ML Pen Generic drug: tirzepatide SMARTSIG:1 pre-filled pen syringe SUB-Q Once a Week   NON FORMULARY Cultrelle   pantoprazole 40 MG tablet Commonly known as: PROTONIX Take 1 tablet (40 mg total) by mouth daily. Disregard the twice daily dosing prescription.   promethazine 25 MG suppository Commonly known as: PHENERGAN Place 1 suppository (25 mg total) rectally every 6 (six) hours as needed for nausea or vomiting.   promethazine-dextromethorphan 6.25-15 MG/5ML  syrup Commonly known as: PROMETHAZINE-DM Take 5 mLs by mouth 4 (four) times daily as needed.   Vitamin D3 25 MCG (1000 UT) Caps Pt states she takes 2000IU qd          OBJECTIVE:   PHYSICAL EXAM: VS: BP 120/80 (BP Location: Left Arm, Patient Position: Sitting, Cuff Size: Small)   Pulse 65   Ht  (1.549 m)   Wt 131 lb (59.4 kg)   SpO2 98%   BMI 24.75 kg/m    EXAM: General: Pt appears well and is in NAD  Eyes: External eye exam normal   Neck: General: Supple without adenopathy. Thyroid: Thyroid size normal.  No goiter or nodules appreciated.   Lungs: Clear with good BS bilat with no rales, rhonchi, or wheezes  Heart: Auscultation: RRR.  Abdomen: Normoactive bowel sounds, soft, nontender, without masses or organomegaly palpable  Extremities:  BL LE: No pretibial edema normal ROM and strength.  Mental Status: Judgment, insight: Intact Orientation: Oriented to time, place, and person Mood and affect: No depression, anxiety, or agitation     DATA REVIEWED:   Latest Reference Range & Units 02/20/23 11:26  TSH 0.35 - 5.50 uIU/mL 1.21  T4,Free(Direct) 0.60 - 1.60 ng/dL 1.47      Latest Reference Range & Units 11/16/21 08:21  TRAB <=2.00 IU/L 2.66 (H)    Latest Reference Range & Units 12/03/22 09:10  Sodium 134 - 144 mmol/L 141  Potassium 3.5 - 5.2 mmol/L 4.8  Chloride 96 - 106 mmol/L 102  CO2 20 - 29 mmol/L 23  Glucose 70 - 99 mg/dL 81  BUN 6 - 24 mg/dL 8  Creatinine 8.29 - 5.62 mg/dL 1.30  Calcium 8.7 - 86.5 mg/dL 78.4  BUN/Creatinine Ratio 9 - 23  10  eGFR >59 mL/min/1.73 85  Alkaline Phosphatase 44 - 121 IU/L 105  Albumin  3.8 - 4.9 g/dL 4.9  Albumin/Globulin Ratio 1.2 - 2.2  2.1  AST 0 - 40 IU/L 30  ALT 0 - 32 IU/L 50 (H)  Total Protein 6.0 - 8.5 g/dL 7.2  Total Bilirubin 0.0 - 1.2 mg/dL 0.5        Thyroid ultrasound 10/18/2022  Parenchymal Echotexture: Mildly heterogenous   Isthmus: 0.3 cm   Right lobe: 3.7 x 1.6 x 1.6 cm   Left lobe:  4.4 x 1.1 x 1.3 cm   _________________________________________________________   Estimated total number of nodules >/= 1 cm: 0   Number of spongiform nodules >/=  2 cm not described below (TR1): 0   Number of mixed cystic and solid nodules >/= 1.5 cm not described below (TR2): 0   _________________________________________________________   No > 1 cm nodules are seen within the thyroid gland.   Additional nodules, such as labeled # 1 (0.6 cm RIGHT inferior TR-4) and # 2 (0.7 cm LEFT mid TR-4) do not meet threshold for follow-up nor biopsy per current criteria.   No cervical adenopathy or abnormal fluid collection within the imaged neck.   IMPRESSION: 1. Heterogeneity of the thyroid gland without > 1 cm nodule. 2. Two (2) sub-1 cm nodules do not meet threshold for follow-up nor biopsy per current criteria.  ASSESSMENT / PLAN / RECOMMENDATIONS:   Hyperthyroidism:  -Clinically she is improving -She is tolerating methimazole -Her hyperthyroidism is due to Graves' disease as there was slight elevation in TRAb -TFTs are normal, no change   Medications   Methimazole 5 mg, half a tablet 4 days a week   2.  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  Multinodular goiter:  -Thyroid ultrasound 10/2021 showed multiple nodules that did not meet criteria for biopsy, repeat 10/2022 showed stability     Follow-up in 6 months    Signed electronically by: Lyndle Herrlich, MD  [facility] Endocrinology  [facility] [address] [place], [place] [address] Phone: [phone] FAX: [phone]      CC: Shade Flood, MD [address] [place] [address] Phone: [phone]  Fax: [phone]   Return to Endocrinology clinic as below: Future Appointments  Date Time Provider Department Center  02/26/2023 10:40 AM Thomasene Lot, DO  [facility] None

## 2023-02-26 ENCOUNTER — Ambulatory Visit (INDEPENDENT_AMBULATORY_CARE_PROVIDER_SITE_OTHER): Payer: 59 | Admitting: Family Medicine

## 2023-02-26 NOTE — Progress Notes (Incomplete)
[name], D.O.  ABFM, ABOM Specializing in Clinical Bariatric Medicine  Office located at: [address] [place], [place]  [address]     Assessment and Plan:   No orders of the defined types were placed in this encounter.   There are no discontinued medications.   No orders of the defined types were placed in this encounter.    ***   TREATMENT PLAN FOR OBESITY:  Assessment: Condition is {docourse:29403:::1}. {BiometricData (Optional):29179} Fat mass has {DID:29233} by ***lb. Muscle mass has {DID:29233} by ***lb. Total body water has {DID:29233} by ***lb.   Plan:  [name] is currently in the action stage of change. As such, her goal is to continue weight management plan. [name] will work on healthier eating habits and try their best to follow the {mealplan:29239} best they can.   Behavioral Intervention Additional resources provided today: {weightresources:29185} Evidence-based interventions for health behavior change were utilized today including the discussion of self monitoring techniques, problem-solving barriers and SMART goal setting techniques.   Regarding Whitney less desirable eating habits and patterns, we employed the technique of small changes.  Pt will specifically work on: *** for next visit.    Recommended Physical Activity Goals [name] has been advised to work up to 150 minutes of moderate intensity aerobic activity a week and strengthening exercises 2-3 times per week for cardiovascular health, weight loss maintenance and preservation of muscle mass.  She has agreed to {EMEXERCISE:28847}  Pharmacotherapy We discussed various medication options to help [name] with her weight loss efforts and we both agreed to ***.   FOLLOW UP: No follow-ups on file.*** She was informed of the importance of frequent follow up visits to maximize her success with intensive lifestyle modifications for her multiple health conditions.   Subjective:   Chief  complaint: Obesity [name] is here to discuss her progress with her obesity treatment plan. She is on the {MWMwtlossportion/plan2:23431} and states she is following her eating plan approximately ***% of the time. She states she is exercising *** minutes *** days per week.  Interval History:  [name] is here for a follow up office visit. Since last office visit she ***  We reviewed her meal plan and all questions were answered. [name]. When eating on plan, her hunger and cravings are well controlled.      Pharmacotherapy for weight loss: She {srtis (Optional):29129} currently taking {srtpreviousweightlossmeds (Optional):29124} for medical weight loss.  Denies side effects.    Review of Systems:  Pertinent positives were addressed with patient today.   Weight Summary and Biometrics   No data recorded No data recorded ***  No data recorded No data recorded No data recorded No data recorded   Objective:   PHYSICAL EXAM: There were no vitals taken for this visit. There is no height or weight on file to calculate BMI.  General: Well Developed, well nourished, and in no acute distress.  HEENT: Normocephalic, atraumatic Skin: Warm and dry, cap RF less 2 sec, good turgor Chest:  Normal excursion, shape, no gross abn Respiratory: speaking in full sentences, no conversational dyspnea NeuroM-Sk: Ambulates w/o assistance, moves * 4 Psych: A and O *3, insight good, mood-full  DIAGNOSTIC DATA REVIEWED:  BMET    Component Value Date/Time   NA 141 12/03/2022 0910   K 4.8 12/03/2022 0910   CL 102 12/03/2022 0910   CO2 23 12/03/2022 0910  GLUCOSE 81 12/03/2022 0910   GLUCOSE 83 01/02/2022 1216   BUN 8 12/03/2022 0910   CREATININE 0.82 12/03/2022 0910   CALCIUM 10.2 12/03/2022 0910   GFRNONAA >60 11/05/2019 0115   GFRAA >60 11/05/2019 0115   Lab Results  Component  Value Date   HGBA1C 5.5 12/03/2022   HGBA1C 6.3 09/26/2021   Lab Results  Component Value Date   INSULIN 6.7 12/03/2022   INSULIN 7.7 05/24/2022   Lab Results  Component Value Date   TSH 1.21 02/20/2023   CBC    Component Value Date/Time   WBC 6.9 11/16/2021 0821   RBC 5.31 (H) 11/16/2021 0821   HGB 13.5 11/16/2021 0821   HCT 43.2 11/16/2021 0821   PLT 327.0 11/16/2021 0821   MCV 81.4 11/16/2021 0821   MCH 26.6 11/05/2019 0115   MCHC 31.3 11/16/2021 0821   RDW 15.3 11/16/2021 0821   Iron Studies No results found for: "IRON", "TIBC", "FERRITIN", "IRONPCTSAT" Lipid Panel     Component Value Date/Time   CHOL 243 (H) 12/03/2022 0910   TRIG 129 12/03/2022 0910   HDL 53 12/03/2022 0910   CHOLHDL 3.9 05/24/2022 1034   CHOLHDL 4 09/26/2021 0759   VLDL 27.0 09/26/2021 0759   LDLCALC 167 (H) 12/03/2022 0910   Hepatic Function Panel     Component Value Date/Time   PROT 7.2 12/03/2022 0910   ALBUMIN 4.9 12/03/2022 0910   AST 30 12/03/2022 0910   ALT 50 (H) 12/03/2022 0910   ALKPHOS 105 12/03/2022 0910   BILITOT 0.5 12/03/2022 0910   BILIDIR 0.1 04/20/2008 1145      Component Value Date/Time   TSH 1.21 02/20/2023 1126   Nutritional Lab Results  Component Value Date   VD25OH 60.1 12/03/2022   VD25OH 62.0 05/24/2022   VD25OH 55.0 02/22/2022    Attestations:   Reviewed by clinician on day of visit: allergies, medications, problem list, medical history, surgical history, family history, social history, and previous encounter notes.   Patient was in the office today and time spent on visit including pre-visit chart review and post-visit care/coordination of care and electronic medical record documentation was *** minutes. 50% of the time was in face to face counseling of this Whitney medical condition(s) and providing education on treatment options to include the first-line treatment of diet and lifestyle modification.   I,Special Puri,acting as a Neurosurgeon for  [employer], DO.,have documented all relevant documentation on the behalf of Thomasene Lot, DO,as directed by  Thomasene Lot, DO while in the presence of Thomasene Lot, DO.   I, Thomasene Lot, DO, have reviewed all documentation for this visit. The documentation on 02/26/23 for the exam, diagnosis, procedures, and orders are all accurate and complete.

## 2023-02-27 DIAGNOSIS — N182 Chronic kidney disease, stage 2 (mild): Secondary | ICD-10-CM | POA: Diagnosis not present

## 2023-02-27 DIAGNOSIS — K760 Fatty (change of) liver, not elsewhere classified: Secondary | ICD-10-CM | POA: Diagnosis not present

## 2023-02-27 DIAGNOSIS — R21 Rash and other nonspecific skin eruption: Secondary | ICD-10-CM | POA: Diagnosis not present

## 2023-02-27 DIAGNOSIS — R7303 Prediabetes: Secondary | ICD-10-CM | POA: Diagnosis not present

## 2023-03-13 ENCOUNTER — Ambulatory Visit: Payer: 59 | Admitting: Internal Medicine

## 2023-03-27 ENCOUNTER — Encounter (INDEPENDENT_AMBULATORY_CARE_PROVIDER_SITE_OTHER): Payer: Self-pay | Admitting: Family Medicine

## 2023-03-27 ENCOUNTER — Ambulatory Visit (INDEPENDENT_AMBULATORY_CARE_PROVIDER_SITE_OTHER): Payer: 59 | Admitting: Family Medicine

## 2023-03-27 VITALS — BP 107/69 | HR 68 | Temp 97.6°F | Ht 61.0 in | Wt 126.6 lb

## 2023-03-27 DIAGNOSIS — E559 Vitamin D deficiency, unspecified: Secondary | ICD-10-CM | POA: Diagnosis not present

## 2023-03-27 DIAGNOSIS — T753XXD Motion sickness, subsequent encounter: Secondary | ICD-10-CM | POA: Diagnosis not present

## 2023-03-27 DIAGNOSIS — Z6823 Body mass index (BMI) 23.0-23.9, adult: Secondary | ICD-10-CM

## 2023-03-27 DIAGNOSIS — Z6824 Body mass index (BMI) 24.0-24.9, adult: Secondary | ICD-10-CM

## 2023-03-27 DIAGNOSIS — R7303 Prediabetes: Secondary | ICD-10-CM

## 2023-03-27 MED ORDER — PROMETHAZINE HCL 25 MG PO TABS: 25.0000 mg | ORAL_TABLET | Freq: Four times a day (QID) | ORAL | 0 refills | Status: DC | PRN

## 2023-03-27 NOTE — Progress Notes (Signed)
[name], D.O.  ABFM, ABOM Specializing in Clinical Bariatric Medicine  Office located at: [address] [place], [place]  [address]     Assessment and Plan:    Check fasting labs next OV   Vitamin D deficiency Assessment: Condition is stable Lab Results  Component Value Date   VD25OH 60.1 12/03/2022   VD25OH 62.0 05/24/2022   VD25OH 55.0 02/22/2022  - No issues with OTC Vitamin D3 2K IU daily. Denies any adverse side effects.  Plan: - Continue with OTC supplement.   - Will continue to monitor levels regularly to keep levels within normal limits and prevent over supplementation.   Prediabetes Assessment: Condition is stable.  Lab Results  Component Value Date   HGBA1C 5.5 12/03/2022   HGBA1C 5.6 05/24/2022   HGBA1C 5.7 (H) 02/22/2022   INSULIN 6.7 12/03/2022   INSULIN 7.7 05/24/2022  - She has been compliant and tolerant with Mounjaro 15 mg weekly. Denies any side effects. - She drives to [place] to obtain her [employer]! - Her hunger and cravings are mostly controlled when eating on plan.   Plan:  - Continue with med.  - Continue to decrease simple carbs/ sugars; increase fiber and proteins -> follow her meal plan.   - Zameria will continue to work on weight loss, exercise, via their meal plan we devised to help decrease the risk of progressing to diabetes.    Motion sickness, subsequent encounter Assessment: Condition is stable. - Patient endorses she is going on a car trip and typically gets motion sickness in the car. - She takes Phenergan 25 mg prn. Denies any adverse effects.   Plan: - Will refill Phenergan today per pt request.   TREATMENT PLAN FOR OBESITY: BMI 24.0-24.9, adult-current 23.93 Morbid obesity (HCC)-start bmi 35.90/date 11/29/21 Assessment:  [name] is here to discuss her progress with her obesity treatment plan along with follow-up of her obesity related diagnoses. See Medical Weight Management Flowsheet for  complete bioelectrical impedance results.  Condition is improving. Biometric data collected today, was reviewed with patient.   Since last office visit on 01/29/23 patient's Fat mass has decreased by 0.4 lb. Muscle mass has decreased by 5.8 lb. Total body water has increased by 0.4 lb.  Counseling done on how various foods will affect these numbers and how to maximize success  Total lbs lost to date: 64 Total weight loss percentage to date: 33.68   Plan:  - Continue with journaling and recommended goals of [phone] calories and 80+ protein.   - Recommended patient to try recipes from [employer] Intervention Additional resources provided today: patient declined Evidence-based interventions for health behavior change were utilized today including the discussion of self monitoring techniques, problem-solving barriers and SMART goal setting techniques.   Regarding patient's less desirable eating habits and patterns, we employed the technique of small changes.  Pt will specifically work on: continue adherence to prudent nutritional plan for next visit.    Recommended Physical Activity Goals  Kerri has been advised to slowly work up to 150 minutes of moderate intensity aerobic activity a week and strengthening exercises 2-3 times per week for cardiovascular health, weight loss maintenance and preservation of muscle mass.   She has agreed to Continue current level of physical activity   FOLLOW UP: Return in about 4 weeks (around 04/24/2023). She was informed of the importance of frequent follow up visits to maximize her success with intensive lifestyle modifications for her  multiple health conditions.   Subjective:   Chief complaint: Obesity [name] is here to discuss her progress with her obesity treatment plan. She is on the keeping a food journal and adhering to recommended goals of [phone] calories and 80+ protein and states she is following her eating plan  approximately 90% of the time. She states she is exercising (waking 13 miles and gym 2 hrs) 6 days per week.  Interval History:  [name] is here for a follow up office visit.     Since last office visit:   - Reports that she was sick for 2 weeks. - After recovering for being sick, she has been hitting her protein and calorie goals on most days.  - States that she has been stressed due to husbands health issues. For stress management, she walks and goes to the gym.  - Patient recently seen by nephrologist and will bring hard copy of lab results next OV.  Pharmacotherapy for weight loss: She is currently taking  Mounjaro  for medical weight loss.  Denies side effects.    Review of Systems:  Pertinent positives were addressed with patient today.  Weight Summary and Biometrics   Weight Lost Since Last Visit: 2 lb  Weight Gained Since Last Visit: 0    Vitals Temp: 97.6 F (36.4 C) BP: 107/69 Pulse Rate: 68 SpO2: 100 %   Anthropometric Measurements Height: 5\' 1"  (1.549 m) Weight: 126 lb 9.6 oz (57.4 kg) BMI (Calculated): 23.93 Weight at Last Visit: 128 lb Weight Lost Since Last Visit: 2 lb Weight Gained Since Last Visit: 0 Starting Weight: 190 lb Total Weight Loss (lbs): 66 lb (29.9 kg) Peak Weight: 200 lb   Body Composition  Body Fat %: 31.4 % Fat Mass (lbs): 39.8 lbs Muscle Mass (lbs): 82.4 lbs Total Body Water (lbs): 58.8 lbs Visceral Fat Rating : 6   Other Clinical Data Fasting: No Labs: No Today's Visit #: 24 Starting Date: 11/29/21   Objective:   PHYSICAL EXAM: Blood pressure 107/69, pulse 68, temperature 97.6 F (36.4 C), height 5\' 1"  (1.549 m), weight 126 lb 9.6 oz (57.4 kg), SpO2 100 %. Body mass index is 23.92 kg/m.  General: Well Developed, well nourished, and in no acute distress.  HEENT: Normocephalic, atraumatic Skin: Warm and dry, cap RF less 2 sec, good turgor Chest:  Normal excursion, shape, no gross abn Respiratory:  speaking in full sentences, no conversational dyspnea NeuroM-Sk: Ambulates w/o assistance, moves * 4 Psych: A and O *3, insight good, mood-full  DIAGNOSTIC DATA REVIEWED:  BMET    Component Value Date/Time   NA 141 12/03/2022 0910   K 4.8 12/03/2022 0910   CL 102 12/03/2022 0910   CO2 23 12/03/2022 0910   GLUCOSE 81 12/03/2022 0910   GLUCOSE 83 01/02/2022 1216   BUN 8 12/03/2022 0910   CREATININE 0.82 12/03/2022 0910   CALCIUM 10.2 12/03/2022 0910   GFRNONAA >60 11/05/2019 0115   GFRAA >60 11/05/2019 0115   Lab Results  Component Value Date   HGBA1C 5.5 12/03/2022   HGBA1C 6.3 09/26/2021   Lab Results  Component Value Date   INSULIN 6.7 12/03/2022   INSULIN 7.7 05/24/2022   Lab Results  Component Value Date   TSH 1.21 02/20/2023   CBC    Component Value Date/Time   WBC 6.9 11/16/2021 0821   RBC 5.31 (H) 11/16/2021 0821   HGB 13.5 11/16/2021 0821   HCT 43.2 11/16/2021 0821   PLT 327.0 11/16/2021 9147  MCV 81.4 11/16/2021 0821   MCH 26.6 11/05/2019 0115   MCHC 31.3 11/16/2021 0821   RDW 15.3 11/16/2021 0821   Iron Studies No results found for: "IRON", "TIBC", "FERRITIN", "IRONPCTSAT" Lipid Panel     Component Value Date/Time   CHOL 243 (H) 12/03/2022 0910   TRIG 129 12/03/2022 0910   HDL 53 12/03/2022 0910   CHOLHDL 3.9 05/24/2022 1034   CHOLHDL 4 09/26/2021 0759   VLDL 27.0 09/26/2021 0759   LDLCALC 167 (H) 12/03/2022 0910   Hepatic Function Panel     Component Value Date/Time   PROT 7.2 12/03/2022 0910   ALBUMIN 4.9 12/03/2022 0910   AST 30 12/03/2022 0910   ALT 50 (H) 12/03/2022 0910   ALKPHOS 105 12/03/2022 0910   BILITOT 0.5 12/03/2022 0910   BILIDIR 0.1 04/20/2008 1145      Component Value Date/Time   TSH 1.21 02/20/2023 1126   Nutritional Lab Results  Component Value Date   VD25OH 60.1 12/03/2022   VD25OH 62.0 05/24/2022   VD25OH 55.0 02/22/2022    Attestations:   Reviewed by clinician on day of visit: allergies, medications,  problem list, medical history, surgical history, family history, social history, and previous encounter notes.   Patient was in the office today and time spent on visit including pre-visit chart review and post-visit care/coordination of care and electronic medical record documentation was 30 minutes. 50% of that time was in face to face counseling of this patient's medical condition(s) and providing education on treatment options to always include the first-line treatment of diet and lifestyle modification.    I,Special Puri,acting as a Neurosurgeon for [employer], DO.,have documented all relevant documentation on the behalf of Thomasene Lot, DO,as directed by  Thomasene Lot, DO while in the presence of Thomasene Lot, DO.   I, Thomasene Lot, DO, have reviewed all documentation for this visit. The documentation on 03/27/23 for the exam, diagnosis, procedures, and orders are all accurate and complete.

## 2023-04-25 ENCOUNTER — Encounter (INDEPENDENT_AMBULATORY_CARE_PROVIDER_SITE_OTHER): Payer: Self-pay | Admitting: Family Medicine

## 2023-04-25 ENCOUNTER — Ambulatory Visit (INDEPENDENT_AMBULATORY_CARE_PROVIDER_SITE_OTHER): Payer: 59 | Admitting: Family Medicine

## 2023-04-25 VITALS — BP 105/73 | HR 62 | Temp 98.0°F | Ht 61.0 in | Wt 130.0 lb

## 2023-04-25 DIAGNOSIS — R7303 Prediabetes: Secondary | ICD-10-CM

## 2023-04-25 DIAGNOSIS — E7849 Other hyperlipidemia: Secondary | ICD-10-CM | POA: Diagnosis not present

## 2023-04-25 DIAGNOSIS — Z6824 Body mass index (BMI) 24.0-24.9, adult: Secondary | ICD-10-CM | POA: Diagnosis not present

## 2023-04-25 DIAGNOSIS — R638 Other symptoms and signs concerning food and fluid intake: Secondary | ICD-10-CM

## 2023-04-25 DIAGNOSIS — E559 Vitamin D deficiency, unspecified: Secondary | ICD-10-CM

## 2023-04-25 MED ORDER — TOPIRAMATE 50 MG PO TABS: ORAL_TABLET | ORAL | 0 refills | Status: DC

## 2023-04-25 NOTE — Progress Notes (Signed)
[name], D.O.  ABFM, ABOM Specializing in Clinical Bariatric Medicine  Office located at: [address] [place], [place]  [address]     Assessment and Plan:   Orders Placed This Encounter  Procedures   VITAMIN D 25 Hydroxy (Vit-D Deficiency, Fractures)   Lipid Panel With LDL/HDL Ratio   Comprehensive metabolic panel   Hemoglobin A1c   Insulin, random    Meds ordered this encounter  Medications   topiramate (TOPAMAX) 50 MG tablet    Sig: 1 twice daily    Dispense:  60 tablet    Refill:  0     Prediabetes Abnormal Cravings Assessment: Dorothyann endorses being off Mounjaro 15 mg once weekly due to national drug shortage. Pt reports that her hunger and cravings have increased since stopping the Mounjaro.   Lab Results  Component Value Date   HGBA1C 5.5 12/03/2022   HGBA1C 5.6 05/24/2022   HGBA1C 5.7 (H) 02/22/2022   INSULIN 6.7 12/03/2022   INSULIN 7.7 05/24/2022    Plan: Recheck labs. Begin Topamax. Start with 1/2 tab at morning and 1/2 tab at night for 3-4 days, and then slowly increase to two full tablets daily. Discussed anticipated benefits, possible risks of starting Topamax. All questions were answered appropriately.   Continue her prudent nutritional plan that is low in simple carbohydrates, saturated fats and trans fats to goal of 5-10% weight loss to achieve significant health benefits.  Pt encouraged to continually advance exercise and cardiovascular fitness as tolerated throughout weight loss journey. Will continue to monitor condition closely alongside PCP.    Vitamin D deficiency Assessment: Condition is begin treated with OTC Cholecalciferol 2,000 lU every day. Denies any side effects.  Lab Results  Component Value Date   VD25OH 60.1 12/03/2022   VD25OH 62.0 05/24/2022   VD25OH 55.0 02/22/2022   Plan: Recheck labs. Pt advised to maintain with OTC supplement.   Weight loss will likely improve availability of vitamin D, thus encouraged Ann-Marie  to continue with meal plan and their weight loss efforts to further improve this condition.     Other hyperlipidemia Assessment: Condition is diet/exercise controlled and not quite optimized. Patient is not taking any statin.   Lab Results  Component Value Date   CHOL 243 (H) 12/03/2022   HDL 53 12/03/2022   LDLCALC 167 (H) 12/03/2022   TRIG 129 12/03/2022   CHOLHDL 3.9 05/24/2022   Plan:  Recheck labs. Pt agrees to continue with our treatment plan of a heart-heathy, low cholesterol meal plan We will continue routine screening as patient continues to achieve health goals along their weight loss journey     TREATMENT PLAN FOR OBESITY: BMI 24.0-24.9, adult-current 25.39 Morbid obesity (HCC)-start bmi 35.90/date 11/29/21 Assessment: Normajean Rupinski is here to discuss her progress with her obesity treatment plan along with follow-up of her obesity related diagnoses. See Medical Weight Management Flowsheet for complete bioelectrical impedance results.  Condition is not optimized. Biometric data collected today, was reviewed with patient.   Since last office visit on 03/27/23 patient's  Muscle mass has decreased by 2.4 lb. Fat mass has increased by 5.8 lb. Total body water has increased by 1.8 lb.  Counseling done on how various foods will affect these numbers and how to maximize success  Total lbs lost to date: - 60 Total weight loss percentage to date: 31.58  Plan: Continue with journaling and recommended goals of [phone] calories and 80+ protein.  Behavioral Intervention Additional resources provided today:  patient declined Evidence-based interventions for health behavior change were utilized today including the discussion of self monitoring techniques, problem-solving barriers and SMART goal setting techniques.   Regarding patient's less desirable eating habits and patterns, we employed the technique of small changes.  Pt will specifically work on: continue with prescribed  meal plan and current exercise regiment for next visit.    Recommended Physical Activity Goals  Johnathan has been advised to slowly work up to 150 minutes of moderate intensity aerobic activity a week and strengthening exercises 2-3 times per week for cardiovascular health, weight loss maintenance and preservation of muscle mass.   She has agreed to Continue current level of physical activity   FOLLOW UP: Return in about 3 weeks (around 05/16/2023). She was informed of the importance of frequent follow up visits to maximize her success with intensive lifestyle modifications for her multiple health conditions.  Subjective:   Chief complaint: Obesity Laelani is here to discuss her progress with her obesity treatment plan. She is on the keeping a food journal and adhering to recommended goals of [phone] calories and 80+ protein and states she is following her eating plan approximately 20% of the time. She states she is doing walking/weights 4-5 hrs 6 days per week.  Interval History:  Sparrow Fedak is here for a follow up office visit. Since last OV, Isobel endorses eating more off plan foods, however she notes that her protein consumption has been good. She's been off Mounjaro for the past month due to national drug shortage and reports having more hunger and cravings.   Review of Systems:  Pertinent positives were addressed with patient today.  Reviewed by clinician on day of visit: allergies, medications, problem list, medical history, surgical history, family history, social history, and previous encounter notes.  Weight Summary and Biometrics   Weight Lost Since Last Visit: 0lb  Weight Gained Since Last Visit: 4lb    Vitals Temp: 98 F (36.7 C) BP: 105/73 Pulse Rate: 62 SpO2: 100 %   Anthropometric Measurements Height: 5\' 1"  (1.549 m) Weight: 130 lb (59 kg) BMI (Calculated): 24.58 Weight at Last Visit: 126lb Weight Lost Since Last Visit: 0lb Weight Gained Since Last  Visit: 4lb Starting Weight: 190lb Total Weight Loss (lbs): 62 lb (28.1 kg) Peak Weight: 200lb   Body Composition  Body Fat %: 35.1 % Fat Mass (lbs): 45.6 lbs Muscle Mass (lbs): 80 lbs Total Body Water (lbs): 60.6 lbs Visceral Fat Rating : 7   Other Clinical Data Fasting: yes Labs: yes Today's Visit #: 25 Starting Date: 11/29/21   Objective:   PHYSICAL EXAM: Blood pressure 105/73, pulse 62, temperature 98 F (36.7 C), height 5\' 1"  (1.549 m), weight 130 lb (59 kg), SpO2 100 %. Body mass index is 24.56 kg/m.  General: Well Developed, well nourished, and in no acute distress.  HEENT: Normocephalic, atraumatic Skin: Warm and dry, cap RF less 2 sec, good turgor Chest:  Normal excursion, shape, no gross abn Respiratory: speaking in full sentences, no conversational dyspnea NeuroM-Sk: Ambulates w/o assistance, moves * 4 Psych: A and O *3, insight good, mood-full  DIAGNOSTIC DATA REVIEWED:  BMET    Component Value Date/Time   NA 141 12/03/2022 0910   K 4.8 12/03/2022 0910   CL 102 12/03/2022 0910   CO2 23 12/03/2022 0910   GLUCOSE 81 12/03/2022 0910   GLUCOSE 83 01/02/2022 1216   BUN 8 12/03/2022 0910   CREATININE 0.82 12/03/2022 0910   CALCIUM 10.2 12/03/2022 0910  GFRNONAA >60 11/05/2019 0115   GFRAA >60 11/05/2019 0115   Lab Results  Component Value Date   HGBA1C 5.5 12/03/2022   HGBA1C 6.3 09/26/2021   Lab Results  Component Value Date   INSULIN 6.7 12/03/2022   INSULIN 7.7 05/24/2022   Lab Results  Component Value Date   TSH 1.21 02/20/2023   CBC    Component Value Date/Time   WBC 6.9 11/16/2021 0821   RBC 5.31 (H) 11/16/2021 0821   HGB 13.5 11/16/2021 0821   HCT 43.2 11/16/2021 0821   PLT 327.0 11/16/2021 0821   MCV 81.4 11/16/2021 0821   MCH 26.6 11/05/2019 0115   MCHC 31.3 11/16/2021 0821   RDW 15.3 11/16/2021 0821   Iron Studies No results found for: "IRON", "TIBC", "FERRITIN", "IRONPCTSAT" Lipid Panel     Component Value Date/Time    CHOL 243 (H) 12/03/2022 0910   TRIG 129 12/03/2022 0910   HDL 53 12/03/2022 0910   CHOLHDL 3.9 05/24/2022 1034   CHOLHDL 4 09/26/2021 0759   VLDL 27.0 09/26/2021 0759   LDLCALC 167 (H) 12/03/2022 0910   Hepatic Function Panel     Component Value Date/Time   PROT 7.2 12/03/2022 0910   ALBUMIN 4.9 12/03/2022 0910   AST 30 12/03/2022 0910   ALT 50 (H) 12/03/2022 0910   ALKPHOS 105 12/03/2022 0910   BILITOT 0.5 12/03/2022 0910   BILIDIR 0.1 04/20/2008 1145      Component Value Date/Time   TSH 1.21 02/20/2023 1126   Nutritional Lab Results  Component Value Date   VD25OH 60.1 12/03/2022   VD25OH 62.0 05/24/2022   VD25OH 55.0 02/22/2022    Attestations:   I, Special Puri, acting as a Stage manager for [employer], DO., have compiled all relevant documentation for today's office visit on behalf of Thomasene Lot, DO, while in the presence of [employer], DO.  I have reviewed the above documentation for accuracy and completeness, and I agree with the above. [name], D.O.  The 21st Century Cures Act was signed into law in 2016 which includes the topic of electronic health records.  This provides immediate access to information in MyChart.  This includes consultation notes, operative notes, office notes, lab results and pathology reports.  If you have any questions about what you read please let us know at your next visit so we can discuss your concerns and take corrective action if need be.  We are right here with you.

## 2023-04-26 LAB — LIPID PANEL WITH LDL/HDL RATIO
Cholesterol, Total: 215 mg/dL — ABNORMAL HIGH (ref 100–199)
HDL: 66 mg/dL (ref >39)
LDL Chol Calc (NIH): 133 mg/dL — ABNORMAL HIGH (ref 0–99)
LDL/HDL Ratio: 2 ratio (ref 0.0–3.2)
Triglycerides: 90 mg/dL (ref 0–149)
VLDL Cholesterol Cal: 16 mg/dL (ref 5–40)

## 2023-04-26 LAB — COMPREHENSIVE METABOLIC PANEL
ALT: 56 IU/L — ABNORMAL HIGH (ref 0–32)
AST: 33 IU/L (ref 0–40)
Albumin: 4.4 g/dL (ref 3.8–4.9)
Alkaline Phosphatase: 104 IU/L (ref 44–121)
BUN/Creatinine Ratio: 14 (ref 9–23)
BUN: 11 mg/dL (ref 6–24)
Bilirubin Total: <0.2 mg/dL (ref 0.0–1.2)
CO2: 27 mmol/L (ref 20–29)
Calcium: 9.8 mg/dL (ref 8.7–10.2)
Chloride: 105 mmol/L (ref 96–106)
Creatinine, Ser: 0.81 mg/dL (ref 0.57–1.00)
Globulin, Total: 2.3 g/dL (ref 1.5–4.5)
Glucose: 80 mg/dL (ref 70–99)
Potassium: 4.5 mmol/L (ref 3.5–5.2)
Sodium: 144 mmol/L (ref 134–144)
Total Protein: 6.7 g/dL (ref 6.0–8.5)
eGFR: 86 mL/min/{1.73_m2} (ref >59)

## 2023-04-26 LAB — HEMOGLOBIN A1C
Est. average glucose Bld gHb Est-mCnc: 114 mg/dL
Hgb A1c MFr Bld: 5.6 % (ref 4.8–5.6)

## 2023-04-26 LAB — INSULIN, RANDOM: INSULIN: 11.9 u[IU]/mL (ref 2.6–24.9)

## 2023-04-26 LAB — VITAMIN D 25 HYDROXY (VIT D DEFICIENCY, FRACTURES): Vit D, 25-Hydroxy: 51.9 ng/mL (ref 30.0–100.0)

## 2023-05-16 ENCOUNTER — Other Ambulatory Visit (INDEPENDENT_AMBULATORY_CARE_PROVIDER_SITE_OTHER): Payer: Self-pay | Admitting: Family Medicine

## 2023-05-16 DIAGNOSIS — R638 Other symptoms and signs concerning food and fluid intake: Secondary | ICD-10-CM

## 2023-05-22 ENCOUNTER — Ambulatory Visit (INDEPENDENT_AMBULATORY_CARE_PROVIDER_SITE_OTHER): Payer: 59 | Admitting: Adult Health

## 2023-06-18 ENCOUNTER — Ambulatory Visit (INDEPENDENT_AMBULATORY_CARE_PROVIDER_SITE_OTHER): Payer: 59 | Admitting: Adult Health

## 2023-06-18 ENCOUNTER — Encounter (INDEPENDENT_AMBULATORY_CARE_PROVIDER_SITE_OTHER): Payer: Self-pay | Admitting: Adult Health

## 2023-06-18 VITALS — BP 120/85 | HR 70 | Temp 98.1°F | Ht 61.0 in | Wt 133.0 lb

## 2023-06-18 DIAGNOSIS — E559 Vitamin D deficiency, unspecified: Secondary | ICD-10-CM | POA: Diagnosis not present

## 2023-06-18 DIAGNOSIS — Z6825 Body mass index (BMI) 25.0-25.9, adult: Secondary | ICD-10-CM | POA: Diagnosis not present

## 2023-06-18 DIAGNOSIS — E669 Obesity, unspecified: Secondary | ICD-10-CM | POA: Diagnosis not present

## 2023-06-18 DIAGNOSIS — R748 Abnormal levels of other serum enzymes: Secondary | ICD-10-CM

## 2023-06-18 DIAGNOSIS — E7849 Other hyperlipidemia: Secondary | ICD-10-CM

## 2023-06-18 DIAGNOSIS — Z6824 Body mass index (BMI) 24.0-24.9, adult: Secondary | ICD-10-CM

## 2023-06-18 DIAGNOSIS — R7303 Prediabetes: Secondary | ICD-10-CM

## 2023-06-18 NOTE — Progress Notes (Unsigned)
WEIGHT SUMMARY AND BIOMETRICS  Vitals Temp: 98.1 F (36.7 C) BP: 120/85 Pulse Rate: 70 SpO2: 95 %   Anthropometric Measurements Height: 5\' 1"  (1.549 m) Weight: 133 lb (60.3 kg) BMI (Calculated): 25.14 Weight at Last Visit: 130lb Weight Lost Since Last Visit: 0 Weight Gained Since Last Visit: 3lb Starting Weight: 190lb Total Weight Loss (lbs): 59 lb (26.8 kg) Peak Weight: 200lb   Body Composition  Body Fat %: 33.1 % Fat Mass (lbs): 44 lbs Muscle Mass (lbs): 84.4 lbs Total Body Water (lbs): 61.4 lbs Visceral Fat Rating : 7   Other Clinical Data Fasting: no Labs: no Today's Visit #: 26 Starting Date: 11/29/21    Chief Complaint:   OBESITY [name] is here to discuss her progress with her obesity treatment plan. She is on the keeping a food journal and adhering to recommended goals of [phone] calories and 80 protein and states she is following her eating plan approximately 90 % of the time. She states she is exercising Walking, Gym Exercise 120 minutes 3 times per week.   Interim History: *** Hunger/appetite-*** Cravings- *** Stress- *** Sleep- *** Exercise-*** Hydration-***  Subjective:   1. Elevated liver enzymes Discussed Labs  2. Other hyperlipidemia Discussed Labs  3. Prediabetes Discussed Labs  4. Vitamin D deficiency Discussed Labs  5.    Assessment/Plan:   1. Elevated liver enzymes ***  2. Other hyperlipidemia ***  3. Prediabetes ***  4. Vitamin D deficiency ***  5. BMI 24.0-24.9, adult-current 24.58  [name] is currently in the action stage of change. As such, her goal is to continue with weight loss efforts. She has agreed to keeping a food journal and adhering to recommended goals of [phone] calories and 80+ protein.   Exercise goals: For substantial health benefits, adults should do at least 150 minutes (2 hours and 30 minutes) a week of moderate-intensity, or 75 minutes (1 hour and 15 minutes) a week of  vigorous-intensity aerobic physical activity, or an equivalent combination of moderate- and vigorous-intensity aerobic activity. Aerobic activity should be performed in episodes of at least 10 minutes, and preferably, it should be spread throughout the week.  Behavioral modification strategies: increasing lean protein intake, decreasing simple carbohydrates, increasing vegetables, increasing water intake, decreasing eating out, no skipping meals, meal planning and cooking strategies, avoiding temptations, and planning for success.  Female has agreed to follow-up with our clinic in 4 weeks. She was informed of the importance of frequent follow-up visits to maximize her success with intensive lifestyle modifications for her multiple health conditions.     Objective:   Blood pressure 120/85, pulse 70, temperature 98.1 F (36.7 C), height 5\' 1"  (1.549 m), weight 133 lb (60.3 kg), SpO2 95%. Body mass index is 25.13 kg/m.  General: Cooperative, alert, well developed, in no acute distress. HEENT: Conjunctivae and lids unremarkable. Cardiovascular: Regular rhythm.  Lungs: Normal work of breathing. Neurologic: No focal deficits.   Lab Results  Component Value Date   CREATININE 0.81 04/25/2023   BUN 11 04/25/2023   NA 144 04/25/2023   K 4.5 04/25/2023   CL 105 04/25/2023   CO2 27 04/25/2023   Lab Results  Component Value Date   ALT 56 (H) 04/25/2023   AST 33 04/25/2023   ALKPHOS 104 04/25/2023   BILITOT <0.2 04/25/2023   Lab Results  Component Value Date   HGBA1C 5.6 04/25/2023   HGBA1C 5.5 12/03/2022   HGBA1C 5.6 05/24/2022   HGBA1C 5.7 (H) 02/22/2022  HGBA1C 6.3 09/26/2021   Lab Results  Component Value Date   INSULIN 11.9 04/25/2023   INSULIN 6.7 12/03/2022   INSULIN 7.7 05/24/2022   Lab Results  Component Value Date   TSH 1.21 02/20/2023   Lab Results  Component Value Date   CHOL 215 (H) 04/25/2023   HDL 66 04/25/2023   LDLCALC 133 (H) 04/25/2023   TRIG 90  04/25/2023   CHOLHDL 3.9 05/24/2022   Lab Results  Component Value Date   VD25OH 51.9 04/25/2023   VD25OH 60.1 12/03/2022   VD25OH 62.0 05/24/2022   Lab Results  Component Value Date   WBC 6.9 11/16/2021   HGB 13.5 11/16/2021   HCT 43.2 11/16/2021   MCV 81.4 11/16/2021   PLT 327.0 11/16/2021   No results found for: "IRON", "TIBC", "FERRITIN"  Attestation Statements:   Reviewed by clinician on day of visit: allergies, medications, problem list, medical history, surgical history, family history, social history, and previous encounter notes.  I have reviewed the above documentation for accuracy and completeness, and I agree with the above. -   d. , NP-C

## 2023-07-22 ENCOUNTER — Encounter (INDEPENDENT_AMBULATORY_CARE_PROVIDER_SITE_OTHER): Payer: Self-pay | Admitting: Family Medicine

## 2023-07-22 ENCOUNTER — Ambulatory Visit (INDEPENDENT_AMBULATORY_CARE_PROVIDER_SITE_OTHER): Payer: 59 | Admitting: Family Medicine

## 2023-07-22 VITALS — BP 112/66 | HR 78 | Temp 97.6°F | Ht 61.0 in | Wt 132.0 lb

## 2023-07-22 DIAGNOSIS — E7849 Other hyperlipidemia: Secondary | ICD-10-CM | POA: Diagnosis not present

## 2023-07-22 DIAGNOSIS — R7303 Prediabetes: Secondary | ICD-10-CM | POA: Diagnosis not present

## 2023-07-22 DIAGNOSIS — Z6824 Body mass index (BMI) 24.0-24.9, adult: Secondary | ICD-10-CM

## 2023-07-22 DIAGNOSIS — E559 Vitamin D deficiency, unspecified: Secondary | ICD-10-CM | POA: Diagnosis not present

## 2023-07-22 MED ORDER — VITAMIN D3 25 MCG (1000 UT) PO CAPS: ORAL_CAPSULE | ORAL | Status: DC

## 2023-07-22 NOTE — Progress Notes (Signed)
[name], D.O.  ABFM, ABOM Specializing in Clinical Bariatric Medicine  Office located at: [address] [place], [place]  [address]     Assessment and Plan:  Next OV check CMP, FLD, vitamin D, and fasting insuline and A1c.  Medications Discontinued During This Encounter  Medication Reason   Cholecalciferol (VITAMIN D3) 25 MCG (1000 UT) CAPS Reorder    Meds ordered this encounter  Medications   Cholecalciferol (VITAMIN D3) 25 MCG (1000 UT) CAPS    Sig: Pt states she takes 2000IU qd    Other hyperlipidemia Assessment: Condition is Improving, but not optimized.Marland Kitchen Her LDL improved from 167 on 12/03/2022 to 133 on 04/25/2023. This is diet/exercise controlled.  Lab Results  Component Value Date   CHOL 215 (H) 04/25/2023   HDL 66 04/25/2023   LDLCALC 133 (H) 04/25/2023   TRIG 90 04/25/2023   CHOLHDL 3.9 05/24/2022   Plan: - Continue with meds and/or our treatment plan of a heart-heathy, low cholesterol meal plan.  - Cardiovascular risk and specific lipid/LDL goals reviewed.  - I stressed the importance that patient continue with our prudent nutritional plan that is low in saturated and trans fats, and low in fatty carbs to improve these numbers.   - We will continue routine screening as patient continues to achieve health goals along their weight loss journey and recheck labs next ov.     Prediabetes Assessment: Condition is Not at goal.. She informed me that Topiramate gave her the worse headaches to the point where she threw up so she inquired about taking Phentermine.  Lab Results  Component Value Date   HGBA1C 5.6 04/25/2023   HGBA1C 5.5 12/03/2022   HGBA1C 5.6 05/24/2022   INSULIN 11.9 04/25/2023   INSULIN 6.7 12/03/2022   INSULIN 7.7 05/24/2022   Lab Results  Component Value Date   CREATININE 0.81 04/25/2023   BUN 11 04/25/2023   NA 144 04/25/2023   K 4.5 04/25/2023   CL 105 04/25/2023   CO2 27 04/25/2023      Component Value Date/Time    PROT 6.7 04/25/2023 0954   ALBUMIN 4.4 04/25/2023 0954   AST 33 04/25/2023 0954   ALT 56 (H) 04/25/2023 0954   ALKPHOS 104 04/25/2023 0954   BILITOT <0.2 04/25/2023 0954   BILIDIR 0.1 04/20/2008 1145    Plan: - Continue Mounjaro as directed.  She denies need for refill.   - I informed her that I don't think Phentermine would be the best option to take in between her Mounjaro and explained why and the side effects and risks.   - Anticipatory guidance given.    - Illeana will continue to work on weight loss, exercise, via their meal plan we devised to help decrease the risk of progressing to diabetes.   - We will recheck A1c and fasting insulin level in approximately 3 months from last check, or as deemed appropriate.    Vitamin D deficiency Assessment: Condition is At goal.. She continues OTC vitamin D without difficulty. She denies any adverse side effects and tolerates this well.  Lab Results  Component Value Date   VD25OH 51.9 04/25/2023   VD25OH 60.1 12/03/2022   VD25OH 62.0 05/24/2022   Plan: - Continue OTC vitamin D 2,000lU as directed. I will refill today.  - We will need to monitor levels regularly (every 3-4 mo on average) to keep levels within normal limits and prevent over supplementation.   TREATMENT PLAN FOR OBESITY: BMI 24.0-24.9, adult-current  24.95 Morbid obesity (HCC)-start bmi 35.90/date 11/29/21 Assessment:  [name] is here to discuss her progress with her obesity treatment plan along with follow-up of her obesity related diagnoses. See Medical Weight Management Flowsheet for complete bioelectrical impedance results.  Condition is docourse: improving. Biometric data collected today, was reviewed with patient.   Since last office visit on 06/18/2023 patient's  Muscle mass has increased by 2lb. Fat mass has decreased by 2.8lb. Total body water has decreased by 1.6lb.  Counseling done on how various foods will affect these numbers and how to maximize  success  Total lbs lost to date: 58 Total weight loss percentage to date: 30.53%   Plan: Continue to journal and adhere to the recommended goals of [phone] calories and 80+g of protein  - I advised her to go to on [email or web address]  Behavioral Intervention Additional resources provided today: patient declined Evidence-based interventions for health behavior change were utilized today including the discussion of self monitoring techniques, problem-solving barriers and SMART goal setting techniques.   Regarding patient's less desirable eating habits and patterns, we employed the technique of small changes.  Pt will specifically work on: Pt states her goal is to lose "15lbs" but we recommended she decrease fatty foods to decrease cholesterol and ALT levels and increase strength training for next visit.    She has agreed to Continue current level of physical activity.  FOLLOW UP: Return in about 2 months (around 09/21/2023). She was informed of the importance of frequent follow up visits to maximize her success with intensive lifestyle modifications for her multiple health conditions.  Subjective:   Chief complaint: Obesity Retia is here to discuss her progress with her obesity treatment plan. She is on the keeping a food journal and adhering to recommended goals of [phone] calories and 80+ protein and states she is following her eating plan approximately 80% of the time. She states she is going to the gym and walking 6 days a week.   Interval History:  [name] is here for a follow up office visit. Since last OV,  she has been well. She endorses following the prescribed meal plan and exercising. She continues to not drink alcohol or take tylenol.    Pharmacotherapy for weight loss: She is currently taking Mounjaro for medical weight loss.  Denies side effects.    Review of Systems:  Pertinent positives were addressed with patient today.  Reviewed by clinician on  day of visit: allergies, medications, problem list, medical history, surgical history, family history, social history, and previous encounter notes.  Weight Summary and Biometrics   Weight Lost Since Last Visit: 1  Weight Gained Since Last Visit: 0   Vitals Temp: 97.6 F (36.4 C) BP: 112/66 Pulse Rate: 78 SpO2: 98 %   Anthropometric Measurements Height: 5\' 1"  (1.549 m) Weight: 132 lb (59.9 kg) BMI (Calculated): 24.95 Weight at Last Visit: 133 lb Weight Lost Since Last Visit: 1 Weight Gained Since Last Visit: 0 Starting Weight: 190 lb Total Weight Loss (lbs): 58 lb (26.3 kg) Peak Weight: 200 lb   Body Composition  Body Fat %: 31.1 % Fat Mass (lbs): 41.2 lbs Muscle Mass (lbs): 86.4 lbs Total Body Water (lbs): 59.8 lbs Visceral Fat Rating : 6   Other Clinical Data Fasting: no Labs: no Today's Visit #: 27 Starting Date: 11/29/21    Objective:   PHYSICAL EXAM: Blood pressure 112/66, pulse 78, temperature 97.6 F (36.4 C), height 5\' 1"  (1.549 m), weight 132 lb (  59.9 kg), SpO2 98%. Body mass index is 24.94 kg/m.  General: Well Developed, well nourished, and in no acute distress.  HEENT: Normocephalic, atraumatic Skin: Warm and dry, cap RF less 2 sec, good turgor Chest:  Normal excursion, shape, no gross abn Respiratory: speaking in full sentences, no conversational dyspnea NeuroM-Sk: Ambulates w/o assistance, moves * 4 Psych: A and O *3, insight good, mood-full  DIAGNOSTIC DATA REVIEWED:  BMET    Component Value Date/Time   NA 144 04/25/2023 0954   K 4.5 04/25/2023 0954   CL 105 04/25/2023 0954   CO2 27 04/25/2023 0954   GLUCOSE 80 04/25/2023 0954   GLUCOSE 83 01/02/2022 1216   BUN 11 04/25/2023 0954   CREATININE 0.81 04/25/2023 0954   CALCIUM 9.8 04/25/2023 0954   GFRNONAA >60 11/05/2019 0115   GFRAA >60 11/05/2019 0115   Lab Results  Component Value Date   HGBA1C 5.6 04/25/2023   HGBA1C 6.3 09/26/2021   Lab Results  Component Value Date    INSULIN 11.9 04/25/2023   INSULIN 7.7 05/24/2022   Lab Results  Component Value Date   TSH 1.21 02/20/2023   CBC    Component Value Date/Time   WBC 6.9 11/16/2021 0821   RBC 5.31 (H) 11/16/2021 0821   HGB 13.5 11/16/2021 0821   HCT 43.2 11/16/2021 0821   PLT 327.0 11/16/2021 0821   MCV 81.4 11/16/2021 0821   MCH 26.6 11/05/2019 0115   MCHC 31.3 11/16/2021 0821   RDW 15.3 11/16/2021 0821   Iron Studies No results found for: "IRON", "TIBC", "FERRITIN", "IRONPCTSAT" Lipid Panel     Component Value Date/Time   CHOL 215 (H) 04/25/2023 0954   TRIG 90 04/25/2023 0954   HDL 66 04/25/2023 0954   CHOLHDL 3.9 05/24/2022 1034   CHOLHDL 4 09/26/2021 0759   VLDL 27.0 09/26/2021 0759   LDLCALC 133 (H) 04/25/2023 0954   Hepatic Function Panel     Component Value Date/Time   PROT 6.7 04/25/2023 0954   ALBUMIN 4.4 04/25/2023 0954   AST 33 04/25/2023 0954   ALT 56 (H) 04/25/2023 0954   ALKPHOS 104 04/25/2023 0954   BILITOT <0.2 04/25/2023 0954   BILIDIR 0.1 04/20/2008 1145      Component Value Date/Time   TSH 1.21 02/20/2023 1126   Nutritional Lab Results  Component Value Date   VD25OH 51.9 04/25/2023   VD25OH 60.1 12/03/2022   VD25OH 62.0 05/24/2022    Attestations:  Patient was in the office today and time spent on visit including pre-visit chart review and post-visit care/coordination of care and electronic medical record documentation was 30 minutes. 50% of the time was in face to face counseling of this patient's medical condition(s) and providing education on treatment options to include the first-line treatment of diet and lifestyle modification.  I, Clinical biochemist, acting as a Stage manager for [employer], DO., have compiled all relevant documentation for today's office visit on behalf of Thomasene Lot, DO, while in the presence of [employer], DO.  I have reviewed the above documentation for accuracy and completeness, and I agree with the above.  [name], D.O.  The 21st Century Cures Act was signed into law in 2016 which includes the topic of electronic health records.  This provides immediate access to information in MyChart.  This includes consultation notes, operative notes, office notes, lab results and pathology reports.  If you have any questions about what you read please let us know at your next visit  so we can discuss your concerns and take corrective action if need be.  We are right here with you.

## 2023-08-22 ENCOUNTER — Encounter: Payer: Self-pay | Admitting: Internal Medicine

## 2023-08-22 ENCOUNTER — Ambulatory Visit: Payer: 59 | Admitting: Internal Medicine

## 2023-08-22 VITALS — BP 114/78 | HR 76 | Ht 61.0 in | Wt 134.0 lb

## 2023-08-22 DIAGNOSIS — E0500 Thyrotoxicosis with diffuse goiter without thyrotoxic crisis or storm: Secondary | ICD-10-CM | POA: Diagnosis not present

## 2023-08-22 DIAGNOSIS — E0590 Thyrotoxicosis, unspecified without thyrotoxic crisis or storm: Secondary | ICD-10-CM | POA: Diagnosis not present

## 2023-08-22 LAB — T3, FREE: T3, Free: 3.4 pg/mL (ref 2.3–4.2)

## 2023-08-22 LAB — TSH: TSH: 1.55 u[IU]/mL (ref 0.35–5.50)

## 2023-08-22 LAB — T4, FREE: Free T4: 1.02 ng/dL (ref 0.60–1.60)

## 2023-08-22 NOTE — Progress Notes (Signed)
Name: [name]  MRN/ DOB: [id number], Nov 08, 1970    Age/ Sex: 52 y.o., female     PCP: Shade Flood, MD   Reason for Endocrinology Evaluation: Hyperthyroidism     Initial Endocrinology Clinic Visit: 10/19/2021    PATIENT IDENTIFIER: [name] is a 52 y.o., female with a past medical history of hyperthyroidism, Asthma and Hx of right nephrectomy for benign reasons. She has followed with  Endocrinology clinic since 10/19/2021 for consultative assistance with management of her hyperthyroidism.   HISTORICAL SUMMARY:  She has been noted with suppressed TSh at 0.00 uIU/mL in 09/2021 ( no concommitent FT4at the time) Repeat testing confirmed a TSH 0.01 uIU/mL with normal FT4 at 0.8 ng/dL and normal FT3 at 4.2 pg/mL    She was also found to have slight elevation of Anti-TPO Abs at 16 IU/mL  TRAB slightly elevated at 2.66 IU/L   She was started on methimazole 10/2021   Denies radiation exposure   Thyroid ultrasound 10/20/2021 showed multinodular goiter   Mother and sister with thyroid disease    SUBJECTIVE:    Today (08/22/2023):  [name] is here for hyperthyroidism.  Weight stable  No local neck enlargement  Denies palpations Has occasional  constipation but no diarrhea  Denies tremors   Methimazole 5 mg, half a tablet four days a week  ( Monday Wednesday, Friday and Sunday )    HISTORY:  Past Medical History:  Past Medical History:  Diagnosis Date   Arthritis    Asthma    Complication of anesthesia    respirations slow down after coming out from Anesthesia   Endometriosis    GERD (gastroesophageal reflux disease)    H/O right nephrectomy    Mass of right kidney    Other fatigue    Palpitations    Post-operative nausea and vomiting    Shortness of breath on exertion    Past Surgical History:  Past Surgical History:  Procedure Laterality Date   CERVICAL DISC SURGERY     CESAREAN SECTION     x3  [phone])   CHOLECYSTECTOMY     COLONOSCOPY  12/17/2017   Dr.Stark   EUS N/A 03/14/2017   Procedure: UPPER ENDOSCOPIC ULTRASOUND (EUS) LINEAR;  Surgeon: Rachael Fee, MD;  Location: [facility];  Service: Endoscopy;  Laterality: N/A;   I & D EXTREMITY Left 05/26/2021   Procedure: IRRIGATION AND DEBRIDEMENT EXTREMITY WITH COMPLEX CLOSURE OF WOUND;  Surgeon: Cammy Copa, MD;  Location: [facility] OR;  Service: Orthopedics;  Laterality: Left;   KNEE ARTHROSCOPY Left 05/26/2021   Procedure: ARTHROSCOPIC KNEE WASHOUT;  Surgeon: Cammy Copa, MD;  Location: [facility] OR;  Service: Orthopedics;  Laterality: Left;   LAPAROSCOPIC ABDOMINAL EXPLORATION N/A 05/17/2017   Procedure: LAPAROSCOPIC POSSIBLE OPEN REMOVAL OF STOMACH MASS;  Surgeon: Avel Peace, MD;  Location: [facility] ORS;  Service: General;  Laterality: N/A;   LAPAROSCOPIC ASSISTED VAGINAL HYSTERECTOMY  2003   LAPAROSCOPIC NEPHRECTOMY Right 05/17/2017   Procedure: RIGHT RADICAL NEPHRECTOMY, URETERECTOMY;  Surgeon: Crist Fat, MD;  Location: [facility];  Service: Urology;  Laterality: Right;   TOTAL ABDOMINAL HYSTERECTOMY     tubal preganancy     Social History:  reports that she has never smoked. She has never used smokeless tobacco. She reports that she does not drink alcohol and does not use drugs. Family History:  Family History  Problem Relation Age of Onset   Alcoholism Mother    Thyroid disease Mother  Stroke Mother    Hypertension Mother    Cancer Mother 21       "back cancer"   Other Mother        hysterectomy at 67 for "pre-cancerous cervical cells"   Obesity Father    Cancer Father    Hypertension Father    Colon cancer Father 60   Cervical cancer Sister        full sister dx. unspecified age   Colon polyps Sister        unspecified number   Colon polyps Sister        paternal half-sister w/ colon polyps dx. age 43 or younger - unspecified number   Colon cancer Sister 7       paternal half-sister     Colon polyps Sister        paternal half-sister; unspecified number   Colon cancer Sister        paternal half-sister dx. before age 60; has had two colon cancers   Breast cancer Sister        paternal half-sister dx under age 48   Renal cancer Sister 95       paternal half-sister dx. w/ "LRC - lymphoid renal cancer"   Cancer Sister 8       paternal half-sister dx cancer of wrist that was not a skin cancer   Breast cancer Maternal Grandmother        unspecified age   Diabetes Maternal Grandmother    Diabetes Paternal Grandfather    Colon polyps Son 66       has had a "bleeding polyp"   Colon cancer Paternal Aunt        (x2) paternal aunts with colon cancer at unspecified ages; lim info   Breast cancer Paternal Aunt        (x3) paternal aunts with breast cancer at unspecified ages; lim info   Colon cancer Paternal Uncle        dx. unspecified age; lim info   Cervical cancer Other 48       niece; +hysterectomy   Alcoholism Other    Obesity Other    Esophageal cancer Neg Hx    Stomach cancer Neg Hx      HOME MEDICATIONS: Allergies as of 08/22/2023       Reactions   Amoxicillin Anaphylaxis   All "cillins" pt was 29-69 years old  Has patient had a PCN reaction causing immediate rash, facial/tongue/throat swelling, SOB or lightheadedness with hypotension: yes Has patient had a PCN reaction causing severe rash involving mucus membranes or skin necrosis: unknown Has patient had a PCN reaction that required hospitalization yes Has patient had a PCN reaction occurring within the last 10 years:no If all of the above answers are "NO", then may proceed with Cephalosporin use.   Ibuprofen Anaphylaxis, Hives   Keflex [cephalexin] Anaphylaxis   Penicillins Anaphylaxis   Has patient had a PCN reaction causing immediate rash, facial/tongue/throat swelling, SOB or lightheadedness with hypotension:yes Has patient had a PCN reaction causing severe rash involving mucus membranes or skin  necrosis: unknown Has patient had a PCN reaction that required hospitalization yes  Has patient had a PCN reaction occurring within the last 10 years: no If all of the above answers are "NO", then may proceed with Cephalosporin use.   Baby Powder [methylbenzethonium] Hives   Doxycycline Diarrhea, Rash        Medication List        Accurate as of August 22, 2023 11:33 AM. If you have any questions, ask your nurse or doctor.          albuterol 108 (90 Base) MCG/ACT inhaler Commonly known as: Proventil HFA Inhale 2 puffs into the lungs every 6 (six) hours as needed for wheezing or shortness of breath.   Cequa 0.09 % Soln Generic drug: cycloSPORINE (PF) Apply 1 drop to eye 2 (two) times daily.   docusate 60 MG/15ML syrup Commonly known as: COLACE Take 60 mg by mouth daily.   furosemide 20 MG tablet Commonly known as: LASIX Take 0.5 tablets (10 mg total) by mouth daily.   Latisse 0.03 % ophthalmic solution Generic drug: bimatoprost Apply 1 drop via applicator once a day as directed   methimazole 5 MG tablet Commonly known as: TAPAZOLE Take 0.5 tablets (2.5 mg total) by mouth as directed. Half a tablet 4 days a week   Mounjaro 15 MG/0.5ML Pen Generic drug: tirzepatide SMARTSIG:1 pre-filled pen syringe SUB-Q Once a Week   NON FORMULARY Cultrelle   pantoprazole 40 MG tablet Commonly known as: PROTONIX Take 1 tablet (40 mg total) by mouth daily. Disregard the twice daily dosing prescription.   promethazine 25 MG tablet Commonly known as: PHENERGAN Take 1 tablet (25 mg total) by mouth every 6 (six) hours as needed for nausea or vomiting.   Vitamin D3 25 MCG (1000 UT) Caps Pt states she takes 2000IU qd          OBJECTIVE:   PHYSICAL EXAM: VS: BP 114/78 (BP Location: Left Arm, Patient Position: Sitting, Cuff Size: Small)   Pulse 76   Ht 5\' 1"  (1.549 m)   Wt 134 lb (60.8 kg)   SpO2 95%   BMI 25.32 kg/m    EXAM: General: Pt appears well and is in NAD   Eyes: External eye exam normal   Neck: General: Supple without adenopathy. Thyroid: Thyroid size normal.  No goiter or nodules appreciated.   Lungs: Clear with good BS bilat   Heart: Auscultation: RRR.  Abdomen:  soft, nontender  Extremities:  BL LE: No pretibial edema   Mental Status: Judgment, insight: Intact Orientation: Oriented to time, place, and person Mood and affect: No depression, anxiety, or agitation     DATA REVIEWED:  Latest Reference Range & Units 08/22/23 11:45  TSH 0.35 - 5.50 uIU/mL 1.55  Triiodothyronine,Free,Serum 2.3 - 4.2 pg/mL 3.4  T4,Free(Direct) 0.60 - 1.60 ng/dL 4.09       Latest Reference Range & Units 11/16/21 08:21  TRAB <=2.00 IU/L 2.66 (H)    Latest Reference Range & Units 12/03/22 09:10  Sodium 134 - 144 mmol/L 141  Potassium 3.5 - 5.2 mmol/L 4.8  Chloride 96 - 106 mmol/L 102  CO2 20 - 29 mmol/L 23  Glucose 70 - 99 mg/dL 81  BUN 6 - 24 mg/dL 8  Creatinine 8.11 - 9.14 mg/dL 7.82  Calcium 8.7 - 95.6 mg/dL 21.3  BUN/Creatinine Ratio 9 - 23  10  eGFR >59 mL/min/1.73 85  Alkaline Phosphatase 44 - 121 IU/L 105  Albumin 3.8 - 4.9 g/dL 4.9  Albumin/Globulin Ratio 1.2 - 2.2  2.1  AST 0 - 40 IU/L 30  ALT 0 - 32 IU/L 50 (H)  Total Protein 6.0 - 8.5 g/dL 7.2  Total Bilirubin 0.0 - 1.2 mg/dL 0.5     Thyroid ultrasound 10/18/2022  Parenchymal Echotexture: Mildly heterogenous   Isthmus: 0.3 cm   Right lobe: 3.7 x 1.6 x 1.6 cm   Left lobe: 4.4 x  1.1 x 1.3 cm   _________________________________________________________   Estimated total number of nodules >/= 1 cm: 0   Number of spongiform nodules >/=  2 cm not described below (TR1): 0   Number of mixed cystic and solid nodules >/= 1.5 cm not described below (TR2): 0   _________________________________________________________   No > 1 cm nodules are seen within the thyroid gland.   Additional nodules, such as labeled # 1 (0.6 cm RIGHT inferior TR-4) and # 2 (0.7 cm LEFT mid TR-4)  do not meet threshold for follow-up nor biopsy per current criteria.   No cervical adenopathy or abnormal fluid collection within the imaged neck.   IMPRESSION: 1. Heterogeneity of the thyroid gland without > 1 cm nodule. 2. Two (2) sub-1 cm nodules do not meet threshold for follow-up nor biopsy per current criteria.  ASSESSMENT / PLAN / RECOMMENDATIONS:   Hyperthyroidism:  -Clinically she is improving -Her hyperthyroidism is due to Graves' disease as there was slight elevation in TRAb -TFTs remain within normal range   Medications   Continue methimazole 5 mg, half a tablet 4 days a week   2.  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  Multinodular goiter:  -Thyroid ultrasound 10/2021 showed multiple nodules that did not meet criteria for biopsy, repeat 10/2022 showed stability     Follow-up in 6 months    Signed electronically by: Lyndle Herrlich, MD  [facility] Endocrinology  [facility] [address] [place], [place] [address] Phone: [phone] FAX: [phone]      CC: Shade Flood, MD [address] [place] [address] Phone: [phone]  Fax: [phone]   Return to Endocrinology clinic as below: Future Appointments  Date Time Provider Department Center  09/05/2023 10:30 AM [facility] PREVISIT RM 50 [facility]  09/23/2023  8:00 AM Thomasene Lot, DO [facility] None  09/25/2023  8:00 AM Meryl Dare, MD [facility]

## 2023-08-23 ENCOUNTER — Encounter: Payer: Self-pay | Admitting: Internal Medicine

## 2023-08-23 MED ORDER — METHIMAZOLE 5 MG PO TABS: 2.5000 mg | ORAL_TABLET | ORAL | 3 refills | Status: DC

## 2023-09-02 DIAGNOSIS — R1031 Right lower quadrant pain: Secondary | ICD-10-CM | POA: Diagnosis not present

## 2023-09-02 DIAGNOSIS — Z905 Acquired absence of kidney: Secondary | ICD-10-CM | POA: Diagnosis not present

## 2023-09-04 ENCOUNTER — Telehealth: Payer: Self-pay | Admitting: Family Medicine

## 2023-09-04 DIAGNOSIS — Z87892 Personal history of anaphylaxis: Secondary | ICD-10-CM

## 2023-09-04 DIAGNOSIS — Z886 Allergy status to analgesic agent status: Secondary | ICD-10-CM

## 2023-09-04 MED ORDER — EPINEPHRINE 0.3 MG/0.3ML IJ SOAJ
0.3000 mg | INTRAMUSCULAR | 0 refills | Status: AC | PRN
Start: 2023-09-04 — End: ?

## 2023-09-04 NOTE — Telephone Encounter (Signed)
Pt is asking for Epipen for a trip she is going on with her husband

## 2023-09-04 NOTE — Telephone Encounter (Signed)
Please clarify medication request.  Ephedrine injection was from July 2018, anesthesia preop.  If she requesting a different medication?  If she is requesting ephedrine that is not a medicine I would usually prescribe and would need to know reason for doing so.  May be best to schedule office visit.  Let me know.

## 2023-09-04 NOTE — Telephone Encounter (Signed)
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

## 2023-09-04 NOTE — Telephone Encounter (Signed)
Noted.  History of NSAID allergy with anaphylaxis, EpiPen ordered.

## 2023-09-04 NOTE — Telephone Encounter (Signed)
Sent to Dr.Greene Please advise.

## 2023-09-05 ENCOUNTER — Ambulatory Visit (AMBULATORY_SURGERY_CENTER): Payer: 59

## 2023-09-05 VITALS — Ht 61.0 in | Wt 134.0 lb

## 2023-09-05 DIAGNOSIS — Z860100 Personal history of colon polyps, unspecified: Secondary | ICD-10-CM

## 2023-09-05 DIAGNOSIS — Z800 Family history of malignant neoplasm of digestive organs: Secondary | ICD-10-CM

## 2023-09-05 MED ORDER — NA SULFATE-K SULFATE-MG SULF 17.5-3.13-1.6 GM/177ML PO SOLN: 1.0000 | Freq: Once | ORAL | 0 refills | Status: AC

## 2023-09-05 NOTE — Telephone Encounter (Signed)
Pt has been notified.

## 2023-09-05 NOTE — Progress Notes (Signed)
Pre visit completed in person; Patient verified name, DOB, and address; No egg or soy allergy known to patient;  No issues known to pt with past sedation with any surgeries or procedures----other than PONV; Patient denies ever being told they had issues or difficulty with intubation-----other than PONV; No FH of Malignant Hyperthermia; Pt is not on diet pills; Pt is not on home 02;  Pt is not on blood thinners;  Pt reports issues with constipation- patient takes Colace daily to prevent constipation; No A fib or A flutter; Have any cardiac testing pending--NO Insurance verified during PV appt--- [employer] Pt can ambulate without assistance;  Pt denies use of chewing tobacco; Discussed diabetic/weight loss medication holds; Discussed NSAID holds; Checked BMI to be less than 50; Pt instructed to use [employer] or GoodRx for a price reduction on prep;  Patient's chart reviewed by Cathlyn Parsons CNRA prior to previsit and patient appropriate for the [facility]; Pre visit completed and red dot placed by patient's name on their procedure day (on provider's schedule);     Instructions printed and given to patient at time of PV appt; GoodRx [facility] coupon given to patient as well;

## 2023-09-16 ENCOUNTER — Encounter: Payer: Self-pay | Admitting: Gastroenterology

## 2023-09-19 NOTE — Progress Notes (Signed)
[name], D.O.  ABFM, ABOM Specializing in Clinical Bariatric Medicine  Office located at: [address] [place], [place]  [address]   Assessment and Plan:  Review labs with pt next OV.  FOR THE DISEASE OF OBESITY: BMI 24.0-24.9, adult-current 24.76 Morbid obesity (HCC)-start bmi 35.90/date 11/29/21 Since last office visit on 07/22/2023 patient's  Muscle mass has decreased by 2.8lb. Fat mass has increased by 2.4lb. Total body water has increased by 0.6lb.  Counseling done on how various foods will affect these numbers and how to maximize success  Total lbs lost to date: 59 Total weight loss percentage to date: 31.05%    Recommended Dietary Goals [name] is currently in the action stage of change. As such, her goal is to continue weight management plan.  She has agreed to: continue current plan   Behavioral Intervention We discussed the following Behavioral Modification Strategies today: increasing lean protein intake to established goals, continue to practice mindfulness when eating, and continue to work on maintaining a reduced calorie state, getting the recommended amount of protein, incorporating whole foods, making healthy choices, staying well hydrated and practicing mindfulness when eating..  Additional resources provided today: Metformin information and Thanksgiving Strategies handouts.   Evidence-based interventions for health behavior change were utilized today including the discussion of self monitoring techniques, problem-solving barriers and SMART goal setting techniques.   Regarding patient's less desirable eating habits and patterns, we employed the technique of small changes.   Pt will specifically work on: Get down 1% in body fat for next visit.    Recommended Physical Activity Goals [name] has been advised to work up to 150 minutes of moderate intensity aerobic activity a week and strengthening exercises 2-3 times per week for cardiovascular health,  weight loss maintenance and preservation of muscle mass.   She has agreed to :  Continue current level of physical activity  and continue to gradually increase the amount and intensity of exercise    Pharmacotherapy We discussed various medication options to help [name] with her weight loss efforts and we both agreed to : continue with nutritional and behavioral strategies   FOR ASSOCIATED CONDITIONS ADDRESSED TODAY: Vitamin D deficiency Assessment: Condition is Controlled.. Pt continues OTC oral vitamin D 2,000lU without significant difficulty or adverse side effects.  Lab Results  Component Value Date   VD25OH 51.9 04/25/2023   VD25OH 60.1 12/03/2022   VD25OH 62.0 05/24/2022   Plan: - Continue with OTC oral vitamin D supplement once daily.    - Informed patient to continue to take the medicine until told otherwise.     - weight loss will likely improve availability of vitamin D, thus encouraged [name] to continue with meal plan and their weight loss efforts to further improve this condition. Vitamin D deficiency -     VITAMIN D 25 Hydroxy (Vit-D Deficiency, Fractures)   Other hyperlipidemia Assessment: Condition is Not optimized.. This is diet/exercise controlled. Pt endorses trying to cut back on her intake of trans fats and high saturated ft foods.  Lab Results  Component Value Date   CHOL 215 (H) 04/25/2023   HDL 66 04/25/2023   LDLCALC 133 (H) 04/25/2023   TRIG 90 04/25/2023   CHOLHDL 3.9 05/24/2022   Plan: - We extensively discussed several lifestyle modifications today and she will continue to work on diet, exercise and weight loss efforts.   - I stressed the importance that patient continue with our prudent nutritional plan that is low in saturated and  trans fats, and low in fatty carbs to improve these numbers.   - We will continue routine screening as patient continues to achieve health goals along their weight loss journey Other hyperlipidemia -     Comprehensive  metabolic panel -     Lipid Panel With LDL/HDL Ratio    Prediabetes Assessment: Condition is Not optimized.. Pt currently takes [name] without difficulty. She endorses this helps with her hunger and cravings. She denies experiencing any adverse side effects. She reminded me that when she was on Topiramate it gave her severe headaches.  Lab Results  Component Value Date   HGBA1C 5.6 04/25/2023   HGBA1C 5.5 12/03/2022   HGBA1C 5.6 05/24/2022   INSULIN 11.9 04/25/2023   INSULIN 6.7 12/03/2022   INSULIN 7.7 05/24/2022    Plan: - Continue Mounrjaro 15mg  once weekly.    - In addition, we discussed the risks and benefits of various medication options such as Metformin which can help [place] in the management of this disease process as well as with weight loss.    - She will begin Metformin 500mg  once daily. (Start half a tablet with lunch and after tolerating this well 4-5 days increase to one full tablet.     - Handouts provided at pt's request after education provided.  All concerns/questions addressed.   Prediabetes -     Hemoglobin A1c -     Insulin, random -     metFORMIN HCl; 1 po with lunch daily  Dispense: 30 tablet; Refill: 0  FOLLOW UP: Return in about 29 days (around 10/22/2023). She was informed of the importance of frequent follow up visits to maximize her success with intensive lifestyle modifications for her multiple health conditions. [name] is aware that we will review all of her lab results at our next visit together in person.  She is aware that if anything is critical/ life threatening with the results, we will be contacting her via MyChart or by my CMA will be calling them prior to the office visit to discuss acute management.    Subjective:   Chief complaint: Obesity [name] is here to discuss her progress with her obesity treatment plan. She is on the keeping a food journal and adhering to recommended goals of [phone] calories and 80+ protein and states  she is following her eating plan approximately 90% of the time. She states she is walking + weights 13 miles 120 minutes 6 days per week.  Interval History:  [name] is here for a follow up office visit. Since last OV,  she has been well. She informed me that she has a strong history of colon cancer and will receive a colonoscopy once yearly. She has one colonoscopy coming up so she is currently not taking all of her medications which makes her constipated. She endorses wanting to lose 10lbs.   Barriers identified: none.   Pharmacotherapy for weight loss: She is currently taking Monjauro with diabetes as the primary indication with adequate clinical response  and without side effects..   Review of Systems:  Pertinent positives were addressed with patient today.  Reviewed by clinician on day of visit: allergies, medications, problem list, medical history, surgical history, family history, social history, and previous encounter notes.  Weight Summary and Biometrics   Weight Lost Since Last Visit: 1lb  Weight Gained Since Last Visit: 0lb    Vitals Temp: 97.8 F (36.6 C) BP: 107/67 Pulse Rate: 70 SpO2: 99 %   Anthropometric Measurements  Height: 5\' 1"  (1.549 m) Weight: 131 lb (59.4 kg) BMI (Calculated): 24.76 Weight at Last Visit: 132lb Weight Lost Since Last Visit: 1lb Weight Gained Since Last Visit: 0lb Starting Weight: 190lb Total Weight Loss (lbs): 59 lb (26.8 kg) Peak Weight: 200   Body Composition  Body Fat %: 33.1 % Fat Mass (lbs): 43.6 lbs Muscle Mass (lbs): 83.6 lbs Total Body Water (lbs): 60.4 lbs Visceral Fat Rating : 7   Other Clinical Data Fasting: yes Labs: yes Today's Visit #: 28 Starting Date: 11/29/21    Objective:   PHYSICAL EXAM: Blood pressure 107/67, pulse 70, temperature 97.8 F (36.6 C), height 5\' 1"  (1.549 m), weight 131 lb (59.4 kg), SpO2 99%. Body mass index is 24.75 kg/m.  General: she is overweight, cooperative  and in no acute distress. PSYCH: Has normal mood, affect and thought process.   HEENT: EOMI, sclerae are anicteric. Lungs: Normal breathing effort, no conversational dyspnea. Extremities: Moves * 4 Neurologic: A and O * 3, good insight  DIAGNOSTIC DATA REVIEWED: BMET    Component Value Date/Time   NA 144 04/25/2023 0954   K 4.5 04/25/2023 0954   CL 105 04/25/2023 0954   CO2 27 04/25/2023 0954   GLUCOSE 80 04/25/2023 0954   GLUCOSE 83 01/02/2022 1216   BUN 11 04/25/2023 0954   CREATININE 0.81 04/25/2023 0954   CALCIUM 9.8 04/25/2023 0954   GFRNONAA >60 11/05/2019 0115   GFRAA >60 11/05/2019 0115   Lab Results  Component Value Date   HGBA1C 5.6 04/25/2023   HGBA1C 6.3 09/26/2021   Lab Results  Component Value Date   INSULIN 11.9 04/25/2023   INSULIN 7.7 05/24/2022   Lab Results  Component Value Date   TSH 1.55 08/22/2023   CBC    Component Value Date/Time   WBC 6.9 11/16/2021 0821   RBC 5.31 (H) 11/16/2021 0821   HGB 13.5 11/16/2021 0821   HCT 43.2 11/16/2021 0821   PLT 327.0 11/16/2021 0821   MCV 81.4 11/16/2021 0821   MCH 26.6 11/05/2019 0115   MCHC 31.3 11/16/2021 0821   RDW 15.3 11/16/2021 0821   Iron Studies No results found for: "IRON", "TIBC", "FERRITIN", "IRONPCTSAT" Lipid Panel     Component Value Date/Time   CHOL 215 (H) 04/25/2023 0954   TRIG 90 04/25/2023 0954   HDL 66 04/25/2023 0954   CHOLHDL 3.9 05/24/2022 1034   CHOLHDL 4 09/26/2021 0759   VLDL 27.0 09/26/2021 0759   LDLCALC 133 (H) 04/25/2023 0954   Hepatic Function Panel     Component Value Date/Time   PROT 6.7 04/25/2023 0954   ALBUMIN 4.4 04/25/2023 0954   AST 33 04/25/2023 0954   ALT 56 (H) 04/25/2023 0954   ALKPHOS 104 04/25/2023 0954   BILITOT <0.2 04/25/2023 0954   BILIDIR 0.1 04/20/2008 1145      Component Value Date/Time   TSH 1.55 08/22/2023 1145   Nutritional Lab Results  Component Value Date   VD25OH 51.9 04/25/2023   VD25OH 60.1 12/03/2022   VD25OH 62.0  05/24/2022    Attestations:   I, Clinical biochemist, acting as a Stage manager for [employer], DO., have compiled all relevant documentation for today's office visit on behalf of Thomasene Lot, DO, while in the presence of [employer], DO.  Reviewed by clinician on day of visit: allergies, medications, problem list, medical history, surgical history, family history, social history, and previous encounter notes pertinent to patient's obesity diagnosis. preparing to see patient (e.g. review and interpretation  of tests, old notes ), obtaining and/or reviewing separately obtained history, performing a medically appropriate examination or evaluation, counseling and educating the patient, ordering medications, test or procedures, documenting clinical information in the electronic or other health care record, and independently interpreting results and communicating results to the patient, family, or caregiver   I have reviewed the above documentation for accuracy and completeness, and I agree with the above. [name], D.O.  The 21st Century Cures Act was signed into law in 2016 which includes the topic of electronic health records.  This provides immediate access to information in MyChart.  This includes consultation notes, operative notes, office notes, lab results and pathology reports.  If you have any questions about what you read please let us know at your next visit so we can discuss your concerns and take corrective action if need be.  We are right here with you.

## 2023-09-23 ENCOUNTER — Other Ambulatory Visit: Payer: Self-pay | Admitting: Family Medicine

## 2023-09-23 ENCOUNTER — Encounter (INDEPENDENT_AMBULATORY_CARE_PROVIDER_SITE_OTHER): Payer: Self-pay | Admitting: Family Medicine

## 2023-09-23 ENCOUNTER — Ambulatory Visit (INDEPENDENT_AMBULATORY_CARE_PROVIDER_SITE_OTHER): Payer: 59 | Admitting: Family Medicine

## 2023-09-23 ENCOUNTER — Telehealth: Payer: Self-pay | Admitting: Family Medicine

## 2023-09-23 VITALS — BP 107/67 | HR 70 | Temp 97.8°F | Ht 61.0 in | Wt 131.0 lb

## 2023-09-23 DIAGNOSIS — E7849 Other hyperlipidemia: Secondary | ICD-10-CM

## 2023-09-23 DIAGNOSIS — Z6824 Body mass index (BMI) 24.0-24.9, adult: Secondary | ICD-10-CM | POA: Diagnosis not present

## 2023-09-23 DIAGNOSIS — R7303 Prediabetes: Secondary | ICD-10-CM | POA: Diagnosis not present

## 2023-09-23 DIAGNOSIS — R609 Edema, unspecified: Secondary | ICD-10-CM

## 2023-09-23 DIAGNOSIS — E559 Vitamin D deficiency, unspecified: Secondary | ICD-10-CM

## 2023-09-23 MED ORDER — FUROSEMIDE 20 MG PO TABS: 10.0000 mg | ORAL_TABLET | Freq: Every day | ORAL | 0 refills | Status: DC

## 2023-09-23 MED ORDER — METFORMIN HCL 500 MG PO TABS: ORAL_TABLET | ORAL | 0 refills | Status: DC

## 2023-09-23 NOTE — Telephone Encounter (Signed)
Sent in and informed pt

## 2023-09-23 NOTE — Telephone Encounter (Signed)
Encourage patient to contact the pharmacy for refills or they can request refills through [facility]   WHAT PHARMACY WOULD THEY LIKE THIS SENT TO:  [id number] - [place], [place] - [address], [place] [address]    MEDICATION NAME & DOSE: furosemide (LASIX) 20 MG tablet   NOTES/COMMENTS FROM PATIENT:      Front office please notify patient: It takes 48-72 hours to process rx refill requests Ask patient to call pharmacy to ensure rx is ready before heading there.

## 2023-09-24 LAB — HEMOGLOBIN A1C
Est. average glucose Bld gHb Est-mCnc: 114 mg/dL
Hgb A1c MFr Bld: 5.6 % (ref 4.8–5.6)

## 2023-09-24 LAB — COMPREHENSIVE METABOLIC PANEL
ALT: 25 [IU]/L (ref 0–32)
AST: 19 [IU]/L (ref 0–40)
Albumin: 4.4 g/dL (ref 3.8–4.9)
Alkaline Phosphatase: 89 [IU]/L (ref 44–121)
BUN/Creatinine Ratio: 13 (ref 9–23)
BUN: 10 mg/dL (ref 6–24)
Bilirubin Total: 0.4 mg/dL (ref 0.0–1.2)
CO2: 25 mmol/L (ref 20–29)
Calcium: 9.5 mg/dL (ref 8.7–10.2)
Chloride: 105 mmol/L (ref 96–106)
Creatinine, Ser: 0.78 mg/dL (ref 0.57–1.00)
Globulin, Total: 2.1 g/dL (ref 1.5–4.5)
Glucose: 87 mg/dL (ref 70–99)
Potassium: 4.4 mmol/L (ref 3.5–5.2)
Sodium: 143 mmol/L (ref 134–144)
Total Protein: 6.5 g/dL (ref 6.0–8.5)
eGFR: 90 mL/min/{1.73_m2} (ref >59)

## 2023-09-24 LAB — LIPID PANEL WITH LDL/HDL RATIO
Cholesterol, Total: 226 mg/dL — ABNORMAL HIGH (ref 100–199)
HDL: 58 mg/dL (ref >39)
LDL Chol Calc (NIH): 155 mg/dL — ABNORMAL HIGH (ref 0–99)
LDL/HDL Ratio: 2.7 ratio (ref 0.0–3.2)
Triglycerides: 75 mg/dL (ref 0–149)
VLDL Cholesterol Cal: 13 mg/dL (ref 5–40)

## 2023-09-24 LAB — VITAMIN D 25 HYDROXY (VIT D DEFICIENCY, FRACTURES): Vit D, 25-Hydroxy: 49.1 ng/mL (ref 30.0–100.0)

## 2023-09-24 LAB — INSULIN, RANDOM: INSULIN: 5.8 u[IU]/mL (ref 2.6–24.9)

## 2023-09-25 ENCOUNTER — Encounter: Payer: Self-pay | Admitting: Gastroenterology

## 2023-09-25 ENCOUNTER — Ambulatory Visit (AMBULATORY_SURGERY_CENTER): Payer: 59 | Admitting: Gastroenterology

## 2023-09-25 VITALS — BP 123/69 | HR 81 | Temp 97.8°F | Resp 18 | Ht 61.0 in | Wt 134.0 lb

## 2023-09-25 DIAGNOSIS — D123 Benign neoplasm of transverse colon: Secondary | ICD-10-CM | POA: Diagnosis not present

## 2023-09-25 DIAGNOSIS — D124 Benign neoplasm of descending colon: Secondary | ICD-10-CM

## 2023-09-25 DIAGNOSIS — Z860101 Personal history of adenomatous and serrated colon polyps: Secondary | ICD-10-CM

## 2023-09-25 DIAGNOSIS — Z800 Family history of malignant neoplasm of digestive organs: Secondary | ICD-10-CM

## 2023-09-25 DIAGNOSIS — Z1211 Encounter for screening for malignant neoplasm of colon: Secondary | ICD-10-CM | POA: Diagnosis not present

## 2023-09-25 DIAGNOSIS — Z09 Encounter for follow-up examination after completed treatment for conditions other than malignant neoplasm: Secondary | ICD-10-CM | POA: Diagnosis not present

## 2023-09-25 DIAGNOSIS — J45909 Unspecified asthma, uncomplicated: Secondary | ICD-10-CM | POA: Diagnosis not present

## 2023-09-25 MED ORDER — SODIUM CHLORIDE 0.9 % IV SOLN: 500.0000 mL | Freq: Once | INTRAVENOUS | Status: DC

## 2023-09-25 NOTE — Op Note (Addendum)
[facility] Patient Name: [name] Procedure Date: 09/25/2023 7:28 AM MRN: [id number] Endoscopist: Meryl Dare , MD, [phone] Age: 52 Referring MD:  Date of Birth: 1971/03/07 Gender: Female Account #: [id number] Procedure:                Colonoscopy Indications:              Surveillance: Personal history of adenomatous                            polyps on last colonoscopy 3 years ago, Family                            history of colon cancer, multiple 1st and 2nd                            degree relatives Medicines:                Monitored Anesthesia Care Procedure:                Pre-Anesthesia Assessment:                           - Prior to the procedure, a History and Physical                            was performed, and patient medications and                            allergies were reviewed. The patient's tolerance of                            previous anesthesia was also reviewed. The risks                            and benefits of the procedure and the sedation                            options and risks were discussed with the patient.                            All questions were answered, and informed consent                            was obtained. Prior Anticoagulants: The patient has                            taken no anticoagulant or antiplatelet agents. ASA                            Grade Assessment: II - A patient with mild systemic                            disease. After reviewing the risks and benefits,  the patient was deemed in satisfactory condition to                            undergo the procedure.                           After obtaining informed consent, the colonoscope                            was passed under direct vision. Throughout the                            procedure, the patient's blood pressure, pulse, and                            oxygen saturations were monitored continuously.  The                            Olympus Scope SN: [id number] was introduced through                            the anus and advanced to the the cecum, identified                            by appendiceal orifice and ileocecal valve. The                            ileocecal valve, appendiceal orifice, and rectum                            were photographed. The quality of the bowel                            preparation was good. The colonoscopy was performed                            without difficulty. The patient tolerated the                            procedure well. Scope In: 8:09:46 AM Scope Out: 8:24:22 AM Scope Withdrawal Time: 0 hours 11 minutes 18 seconds  Total Procedure Duration: 0 hours 14 minutes 36 seconds  Findings:                 The perianal and digital rectal examinations were                            normal.                           Two sessile polyps were found in the descending                            colon and hepatic flexure. The polyps were 7 to 8  mm in size. These polyps were removed with a cold                            snare. Resection and retrieval were complete.                           A few small-mouthed diverticula were found in the                            left colon. There was no evidence of diverticular                            bleeding.                           The exam was otherwise without abnormality on                            direct and retroflexion views. Complications:            No immediate complications. Estimated blood loss:                            None. Estimated Blood Loss:     Estimated blood loss: none. Impression:               - Two 7 to 8 mm polyps in the descending colon and                            at the hepatic flexure.                           - Mild diverticulosis in the left colon.                           - The examination was otherwise normal on direct                             and retroflexion views. Recommendation:           - Repeat colonoscopy, likely 3 years, after studies                            are complete for surveillance based on pathology                            results.                           - Patient has a contact number available for                            emergencies. The signs and symptoms of potential                            delayed complications were discussed with the  patient. Return to normal activities tomorrow.                            Written discharge instructions were provided to the                            patient.                           - Resume previous diet adding high fiber.                           - Continue present medications.                           - Await pathology results. Meryl Dare, MD 09/25/2023 8:30:30 AM This report has been signed electronically.

## 2023-09-25 NOTE — Patient Instructions (Signed)
**  Patient did not want to take any discharge paperwork home with her, everything was discussed before she was discharged but was then shredded**  YOU HAD AN ENDOSCOPIC PROCEDURE TODAY AT THE [facility]:   Refer to the procedure report that was given to you for any specific questions about what was found during the examination.  If the procedure report does not answer your questions, please call your gastroenterologist to clarify.  If you requested that your care partner not be given the details of your procedure findings, then the procedure report has been included in a sealed envelope for you to review at your convenience later.  YOU SHOULD EXPECT: Some feelings of bloating in the abdomen. Passage of more gas than usual.  Walking can help get rid of the air that was put into your GI tract during the procedure and reduce the bloating. If you had a lower endoscopy (such as a colonoscopy or flexible sigmoidoscopy) you may notice spotting of blood in your stool or on the toilet paper. If you underwent a bowel prep for your procedure, you may not have a normal bowel movement for a few days.  Please Note:  You might notice some irritation and congestion in your nose or some drainage.  This is from the oxygen used during your procedure.  There is no need for concern and it should clear up in a day or so.  SYMPTOMS TO REPORT IMMEDIATELY:  Following lower endoscopy (colonoscopy or flexible sigmoidoscopy):  Excessive amounts of blood in the stool  Significant tenderness or worsening of abdominal pains  Swelling of the abdomen that is new, acute  Fever of 100F or higher  For urgent or emergent issues, a gastroenterologist can be reached at any hour by calling [phone]. Do not use MyChart messaging for urgent concerns.    DIET:  We do recommend a small meal at first, but then you may proceed to your regular diet.  Drink plenty of fluids but you should avoid alcoholic beverages for 24  hours.  ACTIVITY:  You should plan to take it easy for the rest of today and you should NOT DRIVE or use heavy machinery until tomorrow (because of the sedation medicines used during the test).    FOLLOW UP: Our staff will call the number listed on your records the next business day following your procedure.  We will call around 7:15- 8:00 am to check on you and address any questions or concerns that you may have regarding the information given to you following your procedure. If we do not reach you, we will leave a message.     If any biopsies were taken you will be contacted by phone or by letter within the next 1-3 weeks.  Please call us at [phone] if you have not heard about the biopsies in 3 weeks.    SIGNATURES/CONFIDENTIALITY: You and/or your care partner have signed paperwork which will be entered into your electronic medical record.  These signatures attest to the fact that that the information above on your After Visit Summary has been reviewed and is understood.  Full responsibility of the confidentiality of this discharge information lies with you and/or your care-partner.

## 2023-09-25 NOTE — Progress Notes (Signed)
History & Physical  Primary Care Physician:  Shade Flood, MD Primary Gastroenterologist: Claudette Head, MD  Impression / Plan:  Personal history of adenomatous colon polyps, multiple first and second-degree relatives with colon cancer in multiple first-degree relatives with colon polyps for colonoscopy.  CHIEF COMPLAINT:  FHCC, FHCP, Personal history of colon polyps   HPI: [name] is a 52 y.o. female with a personal history of adenomatous colon polyps, multiple first and second-degree relatives with colon cancer in multiple first-degree relatives with colon polyps for colonoscopy.   Past Medical History:  Diagnosis Date   Arthritis    Asthma    Complication of anesthesia    respirations slow down after coming out from Anesthesia   Endometriosis    GERD (gastroesophageal reflux disease)    H/O right nephrectomy    Mass of right kidney    Other fatigue    Palpitations    Post-operative nausea and vomiting    Shortness of breath on exertion     Past Surgical History:  Procedure Laterality Date   CERVICAL DISC SURGERY     CESAREAN SECTION     x3 [phone]   CHOLECYSTECTOMY     COLONOSCOPY  12/17/2017   Dr.Jaqulyn Chancellor   COLONOSCOPY  2022   MS-MAC-suprep(good)-TA/tics   EUS N/A 03/14/2017   Procedure: UPPER ENDOSCOPIC ULTRASOUND (EUS) LINEAR;  Surgeon: Rachael Fee, MD;  Location: [facility];  Service: Endoscopy;  Laterality: N/A;   I & D EXTREMITY Left 05/26/2021   Procedure: IRRIGATION AND DEBRIDEMENT EXTREMITY WITH COMPLEX CLOSURE OF WOUND;  Surgeon: Cammy Copa, MD;  Location: [facility] OR;  Service: Orthopedics;  Laterality: Left;   KNEE ARTHROSCOPY Left 05/26/2021   Procedure: ARTHROSCOPIC KNEE WASHOUT;  Surgeon: Cammy Copa, MD;  Location: [facility] OR;  Service: Orthopedics;  Laterality: Left;   LAPAROSCOPIC ABDOMINAL EXPLORATION N/A 05/17/2017   Procedure: LAPAROSCOPIC POSSIBLE OPEN REMOVAL OF STOMACH MASS;  Surgeon: Avel Peace, MD;  Location: [facility] ORS;  Service: General;  Laterality: N/A;   LAPAROSCOPIC ASSISTED VAGINAL HYSTERECTOMY  2003   LAPAROSCOPIC NEPHRECTOMY Right 05/17/2017   Procedure: RIGHT RADICAL NEPHRECTOMY, URETERECTOMY;  Surgeon: Crist Fat, MD;  Location: [facility];  Service: Urology;  Laterality: Right;   TOTAL ABDOMINAL HYSTERECTOMY     TUBAL LIGATION      Prior to Admission medications   Medication Sig Start Date End Date Taking? Authorizing Provider  CEQUA 0.09 % SOLN Apply 1 drop to eye 2 (two) times daily. 12/29/21  Yes [provider]  Cholecalciferol (VITAMIN D3) 25 MCG (1000 UT) CAPS Pt states she takes 2000IU qd 07/22/23  Yes Opalski, Gavin Pound, DO  docusate (COLACE) 60 MG/15ML syrup Take 60 mg by mouth daily.   Yes [provider]  furosemide (LASIX) 20 MG tablet Take 0.5 tablets (10 mg total) by mouth daily. 09/23/23  Yes Shade Flood, MD  LATISSE 0.03 % ophthalmic solution Apply 1 drop via applicator once a day as directed 07/18/23  Yes [provider]  methimazole (TAPAZOLE) 5 MG tablet Take 0.5 tablets (2.5 mg total) by mouth as directed. Half a tablet 4 days a week 08/23/23  Yes Shamleffer, Konrad Dolores, MD  Multiple Vitamin (MULTIVITAMIN PO) Take 1 tablet by mouth daily at 6 (six) AM.   Yes [provider]  pantoprazole (PROTONIX) 40 MG tablet Take 1 tablet (40 mg total) by mouth daily. Disregard the twice daily dosing prescription. Patient taking differently: Take 40 mg by mouth daily. 01/08/23  Yes Meryl Dare, MD  Probiotic Product (CULTURELLE PROBIOTICS PO) Take 1 tablet by mouth daily at 6 (six) AM.   Yes [provider]  albuterol (PROVENTIL HFA) 108 (90 Base) MCG/ACT inhaler Inhale 2 puffs into the lungs every 6 (six) hours as needed for wheezing or shortness of breath. 06/05/22   Shade Flood, MD  EPINEPHrine (EPIPEN 2-PAK) 0.3 mg/0.3 mL IJ SOAJ injection Inject 0.3 mg into the muscle as needed for anaphylaxis.  09/04/23   Shade Flood, MD  metFORMIN (GLUCOPHAGE) 500 MG tablet 1 po with lunch daily 09/23/23   Thomasene Lot, DO  [facility] 15 MG/0.5ML Pen SMARTSIG:1 pre-filled pen syringe SUB-Q Once a Week 07/10/22   [provider]  promethazine (PHENERGAN) 25 MG tablet Take 1 tablet (25 mg total) by mouth every 6 (six) hours as needed for nausea or vomiting. 03/27/23   Thomasene Lot, DO    Current Outpatient Medications  Medication Sig Dispense Refill   CEQUA 0.09 % SOLN Apply 1 drop to eye 2 (two) times daily.     Cholecalciferol (VITAMIN D3) 25 MCG (1000 UT) CAPS Pt states she takes 2000IU qd     docusate (COLACE) 60 MG/15ML syrup Take 60 mg by mouth daily.     furosemide (LASIX) 20 MG tablet Take 0.5 tablets (10 mg total) by mouth daily. 90 tablet 0   LATISSE 0.03 % ophthalmic solution Apply 1 drop via applicator once a day as directed     methimazole (TAPAZOLE) 5 MG tablet Take 0.5 tablets (2.5 mg total) by mouth as directed. Half a tablet 4 days a week 26 tablet 3   Multiple Vitamin (MULTIVITAMIN PO) Take 1 tablet by mouth daily at 6 (six) AM.     pantoprazole (PROTONIX) 40 MG tablet Take 1 tablet (40 mg total) by mouth daily. Disregard the twice daily dosing prescription. (Patient taking differently: Take 40 mg by mouth daily.) 30 tablet 11   Probiotic Product (CULTURELLE PROBIOTICS PO) Take 1 tablet by mouth daily at 6 (six) AM.     albuterol (PROVENTIL HFA) 108 (90 Base) MCG/ACT inhaler Inhale 2 puffs into the lungs every 6 (six) hours as needed for wheezing or shortness of breath. 18 g 0   EPINEPHrine (EPIPEN 2-PAK) 0.3 mg/0.3 mL IJ SOAJ injection Inject 0.3 mg into the muscle as needed for anaphylaxis. 2 each 0   metFORMIN (GLUCOPHAGE) 500 MG tablet 1 po with lunch daily 30 tablet 0   MOUNJARO 15 MG/0.5ML Pen SMARTSIG:1 pre-filled pen syringe SUB-Q Once a Week     promethazine (PHENERGAN) 25 MG tablet Take 1 tablet (25 mg total) by mouth every 6 (six) hours as needed for nausea  or vomiting. 30 tablet 0   Current Facility-Administered Medications  Medication Dose Route Frequency Provider Last Rate Last Admin   0.9 %  sodium chloride infusion  500 mL Intravenous Once Meryl Dare, MD        Allergies as of 09/25/2023 - Review Complete 09/25/2023  Allergen Reaction Noted   Amoxicillin Anaphylaxis 03/08/2017   Ibuprofen Anaphylaxis and Hives 05/09/2017   Keflex [cephalexin] Anaphylaxis 05/09/2017   Penicillins Anaphylaxis    Baby powder [methylbenzethonium] Hives 04/05/2017   Doxycycline Diarrhea and Rash 12/25/2016    Family History  Problem Relation Age of Onset   Alcoholism Mother    Thyroid disease Mother    Stroke Mother    Hypertension Mother    Cancer Mother 43       "back cancer"  Other Mother        hysterectomy at 42 for "pre-cancerous cervical cells"   Colon polyps Father 6   Obesity Father    Cancer Father    Hypertension Father    Colon cancer Father 29   Cervical cancer Sister        full sister dx. unspecified age   Colon polyps Sister        unspecified number   Colon polyps Sister        paternal half-sister w/ colon polyps dx. age 70 or younger - unspecified number   Colon cancer Sister 7       paternal half-sister    Colon polyps Sister        paternal half-sister; unspecified number   Colon polyps Sister 15   Colon cancer Sister        paternal half-sister dx. before age 12; has had two colon cancers   Breast cancer Sister        paternal half-sister dx under age 14   Renal cancer Sister 109       paternal half-sister dx. w/ "LRC - lymphoid renal cancer"   Cancer Sister 61       paternal half-sister dx cancer of wrist that was not a skin cancer   Colon cancer Paternal Aunt        (x2) paternal aunts with colon cancer at unspecified ages; lim info   Breast cancer Paternal Aunt        (x3) paternal aunts with breast cancer at unspecified ages; lim info   Colon cancer Paternal Uncle        dx. unspecified age; lim  info   Breast cancer Maternal Grandmother        unspecified age   Diabetes Maternal Grandmother    Diabetes Paternal Grandfather    Colon polyps Son 20       has had a "bleeding polyp"   Cervical cancer Other 39       niece; +hysterectomy   Alcoholism Other    Obesity Other    Esophageal cancer Neg Hx    Stomach cancer Neg Hx    Rectal cancer Neg Hx     Social History   Socioeconomic History   Marital status: Married    Spouse name: Onalee Hua   Number of children: 3   Years of education: Not on file   Highest education level: Not on file  Occupational History   Occupation: Psychologist, 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    Comment: Used to work as Lawyer at [employer]  Tobacco Use   Smoking status: Never   Smokeless tobacco: Never  Vaping Use   Vaping status: Never Used  Substance and Sexual Activity   Alcohol use: No   Drug use: No   Sexual activity: Yes    Birth control/protection: Surgical  Other Topics Concern   Not on file  Social History Narrative   Not on file   Social Determinants of Health   Financial Resource Strain: Not on file  Food Insecurity: No Food Insecurity (11/17/2021)   Received from [facility], [facility]   Hunger Vital Sign    Worried About Running Out of Food in the Last Year: Never true    Ran Out of Food in the Last Year: Never true  Transportation Needs: Not on file  Physical Activity: Not on file  Stress: Not on file  Social Connections: Unknown (03/18/2022)   [facility]  Social Network    Social Network: Not on file  Intimate Partner Violence: Unknown (02/07/2022)   Received from [facility], [facility]   HITS    Physically Hurt: Not on file    Insult or Talk Down To: Not on file    Threaten Physical Harm: Not on file    Scream or Curse: Not on file    Review of Systems:  All systems reviewed were negative except where noted in HPI.   Physical Exam:  General:  Alert, well-developed, in NAD Head:  Normocephalic  and atraumatic. Eyes:  Sclera clear, no icterus.   Conjunctiva pink. Ears:  Normal auditory acuity. Mouth:  No deformity or lesions.  Neck:  Supple; no masses. Lungs:  Clear throughout to auscultation.   No wheezes, crackles, or rhonchi.  Heart:  Regular rate and rhythm; no murmurs. Abdomen:  Soft, nondistended, nontender. No masses, hepatomegaly. No palpable masses.  Normal bowel sounds.    Rectal:  Deferred   Msk:  Symmetrical without gross deformities. Extremities:  Without edema. Neurologic:  Alert and  oriented x 4; grossly normal neurologically. Skin:  Intact without significant lesions or rashes. Psych:  Alert and cooperative. Normal mood and affect.   Venita Lick. Russella Dar  09/25/2023, 8:01 AM See Loretha Stapler, [facility], to contact our on call provider

## 2023-09-25 NOTE — Progress Notes (Signed)
Called to room to assist during endoscopic procedure.  Patient ID and intended procedure confirmed with present staff. Received instructions for my participation in the procedure from the performing physician.  

## 2023-09-25 NOTE — Progress Notes (Signed)
Pt's states no medical or surgical changes since previsit or office visit. 

## 2023-09-25 NOTE — Progress Notes (Signed)
Sedate, gd SR, tolerated procedure well, VSS, report to RN 

## 2023-09-26 ENCOUNTER — Telehealth: Payer: Self-pay | Admitting: *Deleted

## 2023-09-26 NOTE — Telephone Encounter (Signed)
  Follow up Call-     09/25/2023    7:16 AM 09/20/2021   10:51 AM  Call back number  Post procedure Call Back phone  # [phone]  Permission to leave phone message Yes No     Patient questions:  Do you have a fever, pain , or abdominal swelling? No. Pain Score  0 *  Have you tolerated food without any problems? Yes.    Have you been able to return to your normal activities? Yes.    Do you have any questions about your discharge instructions: Diet   No. Medications  No. Follow up visit  No.  Do you have questions or concerns about your Care? No.  Actions: * If pain score is 4 or above: No action needed, pain <4.

## 2023-09-27 LAB — SURGICAL PATHOLOGY

## 2023-10-01 ENCOUNTER — Encounter: Payer: Self-pay | Admitting: Family Medicine

## 2023-10-01 DIAGNOSIS — Z1231 Encounter for screening mammogram for malignant neoplasm of breast: Secondary | ICD-10-CM

## 2023-10-08 ENCOUNTER — Encounter: Payer: Self-pay | Admitting: Gastroenterology

## 2023-11-11 ENCOUNTER — Other Ambulatory Visit: Payer: Self-pay

## 2023-11-11 DIAGNOSIS — T753XXD Motion sickness, subsequent encounter: Secondary | ICD-10-CM

## 2023-11-11 DIAGNOSIS — J45991 Cough variant asthma: Secondary | ICD-10-CM

## 2023-11-11 NOTE — Telephone Encounter (Signed)
 Pt asking PCP to Rx Promethazine, notes cannot see Olpalsky until next month at least

## 2023-11-12 ENCOUNTER — Ambulatory Visit (INDEPENDENT_AMBULATORY_CARE_PROVIDER_SITE_OTHER): Payer: 59 | Admitting: Family Medicine

## 2023-11-12 MED ORDER — PROMETHAZINE HCL 25 MG PO TABS: 25.0000 mg | ORAL_TABLET | Freq: Four times a day (QID) | ORAL | 0 refills | Status: DC | PRN

## 2023-11-12 MED ORDER — ALBUTEROL SULFATE HFA 108 (90 BASE) MCG/ACT IN AERS: 2.0000 | INHALATION_SPRAY | Freq: Four times a day (QID) | RESPIRATORY_TRACT | 0 refills | Status: AC | PRN

## 2023-11-12 NOTE — Telephone Encounter (Addendum)
 Called pt she states she's seeing Dr Val Eagle tomorrow and she'll get her to fill prescription -doesn't want to make appt. And thanks for filling inhaler.

## 2023-11-12 NOTE — Telephone Encounter (Signed)
 Chart reviewed.  History of gastroenteritis, Phenergan  use at that time.  I can refill that temporarily to have if needed for gastrointestinal illness but if she requires that medication routinely needs office visit to discuss further.  I have also refilled albuterol  for now, but it looks like her last visit with me was in 2023.  Please schedule follow-up visit.

## 2023-11-13 ENCOUNTER — Other Ambulatory Visit (INDEPENDENT_AMBULATORY_CARE_PROVIDER_SITE_OTHER): Payer: Self-pay | Admitting: Family Medicine

## 2023-11-13 ENCOUNTER — Ambulatory Visit (INDEPENDENT_AMBULATORY_CARE_PROVIDER_SITE_OTHER): Payer: 59 | Admitting: Family Medicine

## 2023-11-13 ENCOUNTER — Encounter (INDEPENDENT_AMBULATORY_CARE_PROVIDER_SITE_OTHER): Payer: Self-pay | Admitting: Family Medicine

## 2023-11-13 DIAGNOSIS — T753XXD Motion sickness, subsequent encounter: Secondary | ICD-10-CM

## 2023-11-13 DIAGNOSIS — E7849 Other hyperlipidemia: Secondary | ICD-10-CM | POA: Diagnosis not present

## 2023-11-13 DIAGNOSIS — R7303 Prediabetes: Secondary | ICD-10-CM

## 2023-11-13 DIAGNOSIS — Z6824 Body mass index (BMI) 24.0-24.9, adult: Secondary | ICD-10-CM

## 2023-11-13 DIAGNOSIS — E559 Vitamin D deficiency, unspecified: Secondary | ICD-10-CM | POA: Diagnosis not present

## 2023-11-13 MED ORDER — METFORMIN HCL 500 MG PO TABS: ORAL_TABLET | ORAL | 1 refills | Status: DC

## 2023-11-13 NOTE — Progress Notes (Signed)
 [name], D.O.  ABFM, ABOM Specializing in Clinical Bariatric Medicine  Office located at: [address] [place], [place]  [address]   Assessment and Plan:  Recheck BMP due to being on Metformin  for about 3 months.   FOR THE DISEASE OF OBESITY: Morbid obesity (HCC)-start bmi 35.90/date 11/29/21 BMI 24.0-24.9, adult-current 24.76 Assessment & Plan: Since last office visit on 09/23/23 patient's muscle mass has increased by 1.2 lb. Fat mass has decreased by 1.2lb. Total body water  has decreased by 2.2lb.  Counseling done on how various foods will affect these numbers and how to maximize success  Total lbs lost to date: 59 lbs Total weight loss percentage to date: -31.05%  Recommended Dietary Goals Melissa is currently in the action stage of change. As such, her goal is to continue weight management plan.  She has agreed to: continue current plan   Behavioral Intervention We discussed the following today: increasing lean protein intake to established goals, decreasing simple carbohydrates , keeping healthy foods at home, decreasing eating out or consumption of processed foods, and making healthy choices when eating convenient foods, and practice mindfulness eating and understand the difference between hunger signals and cravings  Additional resources provided today: None  Evidence-based interventions for health behavior change were utilized today including the discussion of self monitoring techniques, problem-solving barriers and SMART goal setting techniques.   Regarding patient's less desirable eating habits and patterns, we employed the technique of small changes.   Pt will specifically work on: get back on track by increasing exercise and following meal plan for next visit.    Recommended Physical Activity Goals Shanta has been advised to work up to 150 minutes of moderate intensity aerobic activity a week and strengthening exercises 2-3 times per week for  cardiovascular health, weight loss maintenance and preservation of muscle mass.   She has agreed to :  Increase physical activity in their day and reduce sedentary time (increase NEAT).   Pharmacotherapy We discussed various medication options to help Aveah with her weight loss efforts and we both agreed to : continue with nutritional and behavioral strategies and continue current anti-obesity medication regimen  Pt taking Mounjaro  15 mg once weekly. Tolerating well with no reported side effects. Managed by telehealth provider in [place] . Continue with current regimen. Advised to eat on-plan while taking Mounjaro .   FOR ASSOCIATED CONDITIONS ADDRESSED TODAY: Prediabetes Assessment & Plan: Lab Results  Component Value Date   HGBA1C 5.6 09/23/2023   HGBA1C 5.6 04/25/2023   HGBA1C 5.5 12/03/2022   INSULIN  5.8 09/23/2023   INSULIN  11.9 04/25/2023   INSULIN  6.7 12/03/2022    Last A1C stable and at goal. Last insulin  at goal and improved to 5.8, prior was 11.9. Pt is taking Metformin  500 mg once daily, started taking on 11/18. Tolerating well, reports no side effects. Endorses som cravings for sweets. Will recheck her renal function at next office visit given she started Metformin  on 11/18. Continue with medication as directed with no changes.   Orders:  -Refill Metformin , no dose changes.    Motion sickness, subsequent encounter Assessment & Plan: Pt is on promethazine  25 mg for nausea. Tolerating well with no side effects. Requested a refill from PCP yesterday. Reports needing to take promethazine  more frequently while in [place]  (riding ferry).   Continue with current regimen as directed by PCP and encouraged her to follow up with PCP as instructed. We will continue to monitor her condition as it pertains to her  weight loss journey.    Vitamin D  deficiency Assessment & Plan: Lab Results  Component Value Date   VD25OH 49.1 09/23/2023   VD25OH 51.9 04/25/2023   VD25OH 60.1  12/03/2022   Last vitamin D  of 49.1 stable but slightly below goal of 50+. Treating condition with OTC vitamin D  2000 IUs daily. Well tolerated with no side effects. No concerns today. Continue with current supplementation. Will continue monitoring her levels in the future as it relates to her weight loss.    Other hyperlipidemia Assessment & Plan: Lab Results  Component Value Date   CHOL 226 (H) 09/23/2023   HDL 58 09/23/2023   LDLCALC 155 (H) 09/23/2023   TRIG 75 09/23/2023   CHOLHDL 3.9 05/24/2022   The 10-year ASCVD risk score (Arnett DK, et al., 2019) is: 3.2%   Values used to calculate the score:     Age: 53 years     Sex: Female     Is Non-Hispanic African American: No     Diabetic: Yes     Tobacco smoker: No     Systolic Blood Pressure: 118 mmHg     Is BP treated: No     HDL Cholesterol: 58 mg/dL     Total Cholesterol: 226 mg/dL  Condition managed with diet and exercise. Pt no currently on any medications or statin therapy. Reviewed last lipid panel, LDL increased to 155 from prior labs of 133. She attributes this to eating more red meat, eating more bread rolls, and cakes she baked. Pt advised to increase exercise and to eat more lean proteins. I recommend she avoid trans fats, fried foods, processed foods and eating out. Continue to manage with diet/exercise.    Follow up:   Return in about 6 weeks (around 12/25/2023). She was informed of the importance of frequent follow up visits to maximize her success with intensive lifestyle modifications for her multiple health conditions.  Subjective:   Chief complaint: Obesity [name] is here to discuss her progress with her obesity treatment plan. She is keeping a food journal and adhering to recommended goals of [phone] calories and 80+ g of protein and states she is following her eating plan approximately 20% of the time. She states she is walking 60 minutes 2 days per week.  Interval History:  Buffi Ewton is here  for a follow up office visit. Since last OV, she went to [facility] for vacation and states the cold weather made her feel ill. Not eating much due to nausea. Endorses nausea after drinking hot cocoa. Per pt wasn't able to eat much. Reports some off-plan eating over the holidays.   Barriers identified:  Recovering from feeling ill/nausea from [place] trip and holiday celebration eating.   Pharmacotherapy for weight loss: She is currently taking Metformin  (off label use for incretin effect and / or insulin  resistance and / or diabetes prevention) with adequate clinical response  and without side effects. and Monjauro with prediabetes / insulin  resistance as the primary indication with adequate clinical response  and without side effects.. Patient did not respond or had adverse reaction to metformin  in the past..   Review of Systems:  Pertinent positives were addressed with patient today.  Reviewed by clinician on day of visit: allergies, medications, problem list, medical history, surgical history, family history, social history, and previous encounter notes.  Weight Summary and Biometrics   Weight Lost Since Last Visit: 0lb  Weight Gained Since Last Visit: 0lb   Vitals Temp: 98 F (36.7 C)  BP: 118/78 Pulse Rate: 74 SpO2: 99 %   Anthropometric Measurements Height: 5' 1 (1.549 m) Weight: 131 lb (59.4 kg) BMI (Calculated): 24.76 Weight at Last Visit: 131lb Weight Lost Since Last Visit: 0lb Weight Gained Since Last Visit: 0lb Starting Weight: 190lb Total Weight Loss (lbs): 59 lb (26.8 kg) Peak Weight: 200lb   Body Composition  Body Fat %: 32.1 % Fat Mass (lbs): 42.4 lbs Muscle Mass (lbs): 84.8 lbs Total Body Water  (lbs): 58.2 lbs Visceral Fat Rating : 7   Other Clinical Data Fasting: no Labs: no Today's Visit #: 29 Starting Date: 11/29/21    Objective:   PHYSICAL EXAM: Blood pressure 118/78, pulse 74, temperature 98 F (36.7 C), height 5' 1 (1.549 m), weight 131 lb (59.4  kg), SpO2 99%. Body mass index is 24.75 kg/m.  General: she is overweight, cooperative and in no acute distress. PSYCH: Has normal mood, affect and thought process.   HEENT: EOMI, sclerae are anicteric. Lungs: Normal breathing effort, no conversational dyspnea. Extremities: Moves * 4 Neurologic: A and O * 3, good insight  DIAGNOSTIC DATA REVIEWED: BMET    Component Value Date/Time   NA 143 09/23/2023 0839   K 4.4 09/23/2023 0839   CL 105 09/23/2023 0839   CO2 25 09/23/2023 0839   GLUCOSE 87 09/23/2023 0839   GLUCOSE 83 01/02/2022 1216   BUN 10 09/23/2023 0839   CREATININE 0.78 09/23/2023 0839   CALCIUM 9.5 09/23/2023 0839   GFRNONAA >60 11/05/2019 0115   GFRAA >60 11/05/2019 0115   Lab Results  Component Value Date   HGBA1C 5.6 09/23/2023   HGBA1C 6.3 09/26/2021   Lab Results  Component Value Date   INSULIN  5.8 09/23/2023   INSULIN  7.7 05/24/2022   Lab Results  Component Value Date   TSH 1.55 08/22/2023   CBC    Component Value Date/Time   WBC 6.9 11/16/2021 0821   RBC 5.31 (H) 11/16/2021 0821   HGB 13.5 11/16/2021 0821   HCT 43.2 11/16/2021 0821   PLT 327.0 11/16/2021 0821   MCV 81.4 11/16/2021 0821   MCH 26.6 11/05/2019 0115   MCHC 31.3 11/16/2021 0821   RDW 15.3 11/16/2021 0821   Iron Studies No results found for: IRON, TIBC, FERRITIN, IRONPCTSAT Lipid Panel     Component Value Date/Time   CHOL 226 (H) 09/23/2023 0839   TRIG 75 09/23/2023 0839   HDL 58 09/23/2023 0839   CHOLHDL 3.9 05/24/2022 1034   CHOLHDL 4 09/26/2021 0759   VLDL 27.0 09/26/2021 0759   LDLCALC 155 (H) 09/23/2023 0839   Hepatic Function Panel     Component Value Date/Time   PROT 6.5 09/23/2023 0839   ALBUMIN 4.4 09/23/2023 0839   AST 19 09/23/2023 0839   ALT 25 09/23/2023 0839   ALKPHOS 89 09/23/2023 0839   BILITOT 0.4 09/23/2023 0839   BILIDIR 0.1 04/20/2008 1145      Component Value Date/Time   TSH 1.55 08/22/2023 1145   Nutritional Lab Results   Component Value Date   VD25OH 49.1 09/23/2023   VD25OH 51.9 04/25/2023   VD25OH 60.1 12/03/2022    Attestations:   I, Vernell Forest, acting as a stage manager for Whitney Jenkins, DO., have compiled all relevant documentation for today's office visit on behalf of Whitney Jenkins, DO, while in the presence of [employer], DO.  Reviewed by clinician on day of visit: allergies, medications, problem list, medical history, surgical history, family history, social history, and previous encounter notes pertinent to  patient's obesity diagnosis.  I have reviewed the above documentation for accuracy and completeness, and I agree with the above. Whitney JINNY [name], D.O.  The 21st Century Cures Act was signed into law in 2016 which includes the topic of electronic health records.  This provides immediate access to information in MyChart.  This includes consultation notes, operative notes, office notes, lab results and pathology reports.  If you have any questions about what you read please let us  know at your next visit so we can discuss your concerns and take corrective action if need be.  We are right here with you.

## 2023-11-13 NOTE — Patient Instructions (Signed)
 The 10-year ASCVD risk score (Arnett DK, et al., 2019) is: 3.2%   Values used to calculate the score:     Age: 53 years     Sex: Female     Is Non-Hispanic African American: No     Diabetic: Yes     Tobacco smoker: No     Systolic Blood Pressure: 118 mmHg     Is BP treated: No     HDL Cholesterol: 58 mg/dL     Total Cholesterol: 226 mg/dL

## 2023-12-10 ENCOUNTER — Other Ambulatory Visit: Payer: Self-pay | Admitting: Family Medicine

## 2023-12-10 DIAGNOSIS — J45991 Cough variant asthma: Secondary | ICD-10-CM

## 2023-12-24 ENCOUNTER — Ambulatory Visit (INDEPENDENT_AMBULATORY_CARE_PROVIDER_SITE_OTHER): Payer: 59 | Admitting: Family Medicine

## 2024-01-02 ENCOUNTER — Ambulatory Visit (INDEPENDENT_AMBULATORY_CARE_PROVIDER_SITE_OTHER): Payer: 59 | Admitting: Family Medicine

## 2024-01-08 ENCOUNTER — Ambulatory Visit (INDEPENDENT_AMBULATORY_CARE_PROVIDER_SITE_OTHER): Payer: 59 | Admitting: Family Medicine

## 2024-01-08 ENCOUNTER — Encounter (INDEPENDENT_AMBULATORY_CARE_PROVIDER_SITE_OTHER): Payer: Self-pay | Admitting: Family Medicine

## 2024-01-08 VITALS — BP 114/70 | HR 104 | Temp 98.4°F | Ht 61.0 in | Wt 134.0 lb

## 2024-01-08 DIAGNOSIS — E559 Vitamin D deficiency, unspecified: Secondary | ICD-10-CM

## 2024-01-08 DIAGNOSIS — R7303 Prediabetes: Secondary | ICD-10-CM | POA: Diagnosis not present

## 2024-01-08 DIAGNOSIS — Z6825 Body mass index (BMI) 25.0-25.9, adult: Secondary | ICD-10-CM

## 2024-01-08 DIAGNOSIS — Z6824 Body mass index (BMI) 24.0-24.9, adult: Secondary | ICD-10-CM

## 2024-01-08 DIAGNOSIS — E7849 Other hyperlipidemia: Secondary | ICD-10-CM | POA: Diagnosis not present

## 2024-01-08 MED ORDER — METFORMIN HCL 500 MG PO TABS: ORAL_TABLET | ORAL | 1 refills | Status: DC

## 2024-01-08 NOTE — Patient Instructions (Signed)
 The 10-year ASCVD risk score (Arnett DK, et al., 2019) is: 3.3%   Values used to calculate the score:     Age: 53 years     Sex: Female     Is Non-Hispanic African American: No     Diabetic: Yes     Tobacco smoker: No     Systolic Blood Pressure: 114 mmHg     Is BP treated: No     HDL Cholesterol: 58 mg/dL     Total Cholesterol: 226 mg/dL

## 2024-01-08 NOTE — Progress Notes (Signed)
 [name], D.O.  ABFM, ABOM Specializing in Clinical Bariatric Medicine  Office located at: [address] [place], [place]  [address]   Assessment and Plan:   FOR THE DISEASE OF OBESITY:  BMI 24.0-24.9, adult-current 25.33 Morbid obesity (HCC)-start bmi 35.90/date 11/29/21 Assessment & Plan: Since last office visit on 11/13/2023 patient's  Muscle mass has decreased by 0.2 lb. Fat mass has increased by 3 lb. Total body water has increased by 2.2 lb.  Counseling done on how various foods will affect these numbers and how to maximize success  Total lbs lost to date: 56 lbs  Total weight loss percentage to date: 29.47%    Recommended Dietary Goals [name] is currently in the action stage of change. As such, her goal is to continue weight management plan.  She has agreed to: continue current plan   Behavioral Intervention We discussed the following today: continue to work on maintaining a reduced calorie state, getting the recommended amount of protein, incorporating whole foods, making healthy choices, staying well hydrated and practicing mindfulness when eating.  Additional resources provided today: None  Evidence-based interventions for health behavior change were utilized today including the discussion of self monitoring techniques, problem-solving barriers and SMART goal setting techniques.   Regarding patient's less desirable eating habits and patterns, we employed the technique of small changes.   Pt will specifically work on: n/a   Recommended Physical Activity Goals Peytyn has been advised to work up to 150 minutes of moderate intensity aerobic activity a week and strengthening exercises 2-3 times per week for cardiovascular health, weight loss maintenance and preservation of muscle mass.   She has agreed to : continue to gradually increase the amount and intensity of exercise routine   Pharmacotherapy We both agreed to : continue with nutritional and behavioral  strategies and maintain with current medications.   FOR ASSOCIATED CONDITIONS ADDRESSED TODAY:   Prediabetes Assessment & Plan: Relevant medications: Metformin 500 mg daily & Mounjaro 15 mg sub-q weekly. Overall she is tolerating both medicines well - occasionally she has constipation. She feels that her hunger/cravings for sweets are abated. Continue current medicines and balanced diet focusing on protein, fruits, and vegetables while limiting simple carbohydrates. Reminded her about adequate hydration.   Relevant Orders:  -     metFORMIN HCl; 1 po with lunch daily  Dispense: 30 tablet; Refill: 1   Other hyperlipidemia Assessment & Plan: Lab Results  Component Value Date   CHOL 226 (H) 09/23/2023   HDL 58 09/23/2023   LDLCALC 155 (H) 09/23/2023   TRIG 75 09/23/2023   CHOLHDL 3.9 05/24/2022   The 10-year ASCVD risk score (Arnett DK, et al., 2019) is: 3.3%   Values used to calculate the score:     Age: 67 years     Sex: Female     Is Non-Hispanic African American: No     Diabetic: Yes     Tobacco smoker: No     Systolic Blood Pressure: 114 mmHg     Is BP treated: No     HDL Cholesterol: 58 mg/dL     Total Cholesterol: 226 mg/dL  Not on cholesterol medicines. Her ASCVD score is likely lower than 3.3% as she is not a diabetic. Ideal HDL is >60. LDL is elevated at 155. Pt advised to reduce saturated and trans fats in diet. Increase exercise as able.    Vitamin D deficiency Assessment & Plan: Pt is doing well on OTC Vitamin D 2000 lU  daily. Continue regimen. Recheck periodically.   Follow up:   Return 03/03/2024. She was informed of the importance of frequent follow up visits to maximize her success with intensive lifestyle modifications for her multiple health conditions.  Subjective:   Chief complaint: Obesity Maya is here to discuss her progress with her obesity treatment plan. She is  keeping a food journal and adhering to recommended goals of [phone] calories and  80+ g of protein and states she is following her eating plan approximately 100% of the time. She states she is walking 10 miles, 2 days per week + weights - restarted this 2 weeks ago.   Interval History:  [name] is here for a follow up office visit. Since last OV on 11/13/2023, [name] is up 3 lbs. Pt had the flu for about 2.5 weeks. Her symptoms included fatigue and loss of appetite. She was mainly eating soups. The fatigue continued which prohibited her from doing her normal exercise regimen. She is feeling better now & restarted her walking regimen 2 weeks ago. She will be having a Tummy Tuck in the near future.   Pharmacotherapy for weight loss: She is currently taking  Metformin 500 mg daily & Mounjaro 15 mg weekly . She obtains the [place] from a telehealth provider in [place].   Review of Systems:  Pertinent positives were addressed with patient today.  Reviewed by clinician on day of visit: allergies, medications, problem list, medical history, surgical history, family history, social history, and previous encounter notes.  Weight Summary and Biometrics   Weight Lost Since Last Visit: 0  Weight Gained Since Last Visit: 3 lb    Vitals Temp: 98.4 F (36.9 C) BP: 114/70 Pulse Rate: (!) 104 SpO2: 90 %   Anthropometric Measurements Height: 5\' 1"  (1.549 m) Weight: 134 lb (60.8 kg) BMI (Calculated): 25.33 Weight at Last Visit: 131 lb Weight Lost Since Last Visit: 0 Weight Gained Since Last Visit: 3 lb Starting Weight: 190 lb Total Weight Loss (lbs): 56 lb (25.4 kg) Peak Weight: 200 lb   Body Composition  Body Fat %: 33.8 % Fat Mass (lbs): 45.4 lbs Muscle Mass (lbs): 84.6 lbs Total Body Water (lbs): 60.4 lbs Visceral Fat Rating : 7   Other Clinical Data Fasting: No Labs: No Today's Visit #: 30 Starting Date: 11/29/21   Objective:   PHYSICAL EXAM: Blood pressure 114/70, pulse (!) 104, temperature 98.4 F (36.9 C), height 5\' 1"  (1.549 m), weight  134 lb (60.8 kg), SpO2 90%. Body mass index is 25.32 kg/m.  General: she is overweight, cooperative and in no acute distress. PSYCH: Has normal mood, affect and thought process.   HEENT: EOMI, sclerae are anicteric. Lungs: Normal breathing effort, no conversational dyspnea. Extremities: Moves * 4 Neurologic: A and O * 3, good insight  DIAGNOSTIC DATA REVIEWED: BMET    Component Value Date/Time   NA 143 09/23/2023 0839   K 4.4 09/23/2023 0839   CL 105 09/23/2023 0839   CO2 25 09/23/2023 0839   GLUCOSE 87 09/23/2023 0839   GLUCOSE 83 01/02/2022 1216   BUN 10 09/23/2023 0839   CREATININE 0.78 09/23/2023 0839   CALCIUM 9.5 09/23/2023 0839   GFRNONAA >60 11/05/2019 0115   GFRAA >60 11/05/2019 0115   Lab Results  Component Value Date   HGBA1C 5.6 09/23/2023   HGBA1C 6.3 09/26/2021   Lab Results  Component Value Date   INSULIN 5.8 09/23/2023   INSULIN 7.7 05/24/2022   Lab Results  Component Value  Date   TSH 1.55 08/22/2023   CBC    Component Value Date/Time   WBC 6.9 11/16/2021 0821   RBC 5.31 (H) 11/16/2021 0821   HGB 13.5 11/16/2021 0821   HCT 43.2 11/16/2021 0821   PLT 327.0 11/16/2021 0821   MCV 81.4 11/16/2021 0821   MCH 26.6 11/05/2019 0115   MCHC 31.3 11/16/2021 0821   RDW 15.3 11/16/2021 0821   Iron Studies No results found for: "IRON", "TIBC", "FERRITIN", "IRONPCTSAT" Lipid Panel     Component Value Date/Time   CHOL 226 (H) 09/23/2023 0839   TRIG 75 09/23/2023 0839   HDL 58 09/23/2023 0839   CHOLHDL 3.9 05/24/2022 1034   CHOLHDL 4 09/26/2021 0759   VLDL 27.0 09/26/2021 0759   LDLCALC 155 (H) 09/23/2023 0839   Hepatic Function Panel     Component Value Date/Time   PROT 6.5 09/23/2023 0839   ALBUMIN 4.4 09/23/2023 0839   AST 19 09/23/2023 0839   ALT 25 09/23/2023 0839   ALKPHOS 89 09/23/2023 0839   BILITOT 0.4 09/23/2023 0839   BILIDIR 0.1 04/20/2008 1145      Component Value Date/Time   TSH 1.55 08/22/2023 1145   Nutritional Lab  Results  Component Value Date   VD25OH 49.1 09/23/2023   VD25OH 51.9 04/25/2023   VD25OH 60.1 12/03/2022   Attestations:   I, Special Puri, acting as a Stage manager for [employer], DO., have compiled all relevant documentation for today's office visit on behalf of Thomasene Lot, DO, while in the presence of [employer], DO.  I have reviewed the above documentation for accuracy and completeness, and I agree with the above. [name], D.O.  The 21st Century Cures Act was signed into law in 2016 which includes the topic of electronic health records.  This provides immediate access to information in MyChart.  This includes consultation notes, operative notes, office notes, lab results and pathology reports.  If you have any questions about what you read please let us know at your next visit so we can discuss your concerns and take corrective action if need be.  We are right here with you.

## 2024-01-31 ENCOUNTER — Other Ambulatory Visit: Payer: Self-pay | Admitting: Family Medicine

## 2024-01-31 DIAGNOSIS — Z1231 Encounter for screening mammogram for malignant neoplasm of breast: Secondary | ICD-10-CM

## 2024-02-13 ENCOUNTER — Ambulatory Visit: Payer: 59 | Admitting: Internal Medicine

## 2024-02-13 ENCOUNTER — Encounter: Payer: Self-pay | Admitting: Internal Medicine

## 2024-02-13 VITALS — BP 118/70 | HR 84 | Ht 61.0 in | Wt 137.6 lb

## 2024-02-13 DIAGNOSIS — E0590 Thyrotoxicosis, unspecified without thyrotoxic crisis or storm: Secondary | ICD-10-CM | POA: Diagnosis not present

## 2024-02-13 DIAGNOSIS — H02834 Dermatochalasis of left upper eyelid: Secondary | ICD-10-CM | POA: Diagnosis not present

## 2024-02-13 DIAGNOSIS — H02421 Myogenic ptosis of right eyelid: Secondary | ICD-10-CM | POA: Diagnosis not present

## 2024-02-13 DIAGNOSIS — H02831 Dermatochalasis of right upper eyelid: Secondary | ICD-10-CM | POA: Diagnosis not present

## 2024-02-13 DIAGNOSIS — E0500 Thyrotoxicosis with diffuse goiter without thyrotoxic crisis or storm: Secondary | ICD-10-CM

## 2024-02-13 DIAGNOSIS — H53483 Generalized contraction of visual field, bilateral: Secondary | ICD-10-CM | POA: Diagnosis not present

## 2024-02-13 DIAGNOSIS — D485 Neoplasm of uncertain behavior of skin: Secondary | ICD-10-CM | POA: Diagnosis not present

## 2024-02-13 DIAGNOSIS — Z01818 Encounter for other preprocedural examination: Secondary | ICD-10-CM | POA: Diagnosis not present

## 2024-02-13 DIAGNOSIS — H0279 Other degenerative disorders of eyelid and periocular area: Secondary | ICD-10-CM | POA: Diagnosis not present

## 2024-02-13 DIAGNOSIS — E042 Nontoxic multinodular goiter: Secondary | ICD-10-CM | POA: Diagnosis not present

## 2024-02-13 DIAGNOSIS — H57813 Brow ptosis, bilateral: Secondary | ICD-10-CM | POA: Diagnosis not present

## 2024-02-13 DIAGNOSIS — H02422 Myogenic ptosis of left eyelid: Secondary | ICD-10-CM | POA: Diagnosis not present

## 2024-02-13 DIAGNOSIS — H02423 Myogenic ptosis of bilateral eyelids: Secondary | ICD-10-CM | POA: Diagnosis not present

## 2024-02-13 LAB — TSH: TSH: 1.1 m[IU]/L

## 2024-02-13 LAB — T4, FREE: Free T4: 1.4 ng/dL (ref 0.8–1.8)

## 2024-02-13 NOTE — Progress Notes (Unsigned)
 Name: [name]  MRN/ DOB: [id number], Aug 09, 1971    Age/ Sex: 53 y.o., female     PCP: Shade Flood, MD   Reason for Endocrinology Evaluation: Hyperthyroidism     Initial Endocrinology Clinic Visit: 10/19/2021    PATIENT IDENTIFIER: [name] is a 53 y.o., female with a past medical history of hyperthyroidism, Asthma and Hx of right nephrectomy for benign reasons. She has followed with [facility] since 10/19/2021 for consultative assistance with management of her hyperthyroidism.   HISTORICAL SUMMARY:  She has been noted with suppressed TSh at 0.00 uIU/mL in 09/2021 ( no concommitent FT4at the time) Repeat testing confirmed a TSH 0.01 uIU/mL with normal FT4 at 0.8 ng/dL and normal FT3 at 4.2 pg/mL    She was also found to have slight elevation of Anti-TPO Abs at 16 IU/mL  TRAB slightly elevated at 2.66 IU/L   She was started on methimazole 10/2021   Denies radiation exposure   Thyroid ultrasound 10/20/2021 showed multinodular goiter   Mother and sister with thyroid disease    SUBJECTIVE:    Today (02/13/2024):  [name] is here for hyperthyroidism.   She continues to follow-up with healthy weight and wellness Weight stable  No local neck enlargement  Denies palpations Has occasional  constipation and  diarrhea - uses colace  Denies tremors   Methimazole 5 mg, half a tablet four days a week  ( Monday, Wednesday, Friday and Sunday )    HISTORY:  Past Medical History:  Past Medical History:  Diagnosis Date   Arthritis    Asthma    Complication of anesthesia    respirations slow down after coming out from Anesthesia   Endometriosis    GERD (gastroesophageal reflux disease)    H/O right nephrectomy    Mass of right kidney    Other fatigue    Palpitations    Post-operative nausea and vomiting    Shortness of breath on exertion    Past Surgical History:  Past Surgical History:  Procedure Laterality  Date   CERVICAL DISC SURGERY     CESAREAN SECTION     x3 [phone])   CHOLECYSTECTOMY     COLONOSCOPY  12/17/2017   Dr.Stark   COLONOSCOPY  2022   MS-MAC-suprep(good)-TA/tics   EUS N/A 03/14/2017   Procedure: UPPER ENDOSCOPIC ULTRASOUND (EUS) LINEAR;  Surgeon: Rachael Fee, MD;  Location: [facility];  Service: Endoscopy;  Laterality: N/A;   I & D EXTREMITY Left 05/26/2021   Procedure: IRRIGATION AND DEBRIDEMENT EXTREMITY WITH COMPLEX CLOSURE OF WOUND;  Surgeon: Cammy Copa, MD;  Location: [facility] OR;  Service: Orthopedics;  Laterality: Left;   KNEE ARTHROSCOPY Left 05/26/2021   Procedure: ARTHROSCOPIC KNEE WASHOUT;  Surgeon: Cammy Copa, MD;  Location: [facility] OR;  Service: Orthopedics;  Laterality: Left;   LAPAROSCOPIC ABDOMINAL EXPLORATION N/A 05/17/2017   Procedure: LAPAROSCOPIC POSSIBLE OPEN REMOVAL OF STOMACH MASS;  Surgeon: Avel Peace, MD;  Location: [facility] ORS;  Service: General;  Laterality: N/A;   LAPAROSCOPIC ASSISTED VAGINAL HYSTERECTOMY  2003   LAPAROSCOPIC NEPHRECTOMY Right 05/17/2017   Procedure: RIGHT RADICAL NEPHRECTOMY, URETERECTOMY;  Surgeon: Crist Fat, MD;  Location: [facility];  Service: Urology;  Laterality: Right;   TOTAL ABDOMINAL HYSTERECTOMY     TUBAL LIGATION     Social History:  reports that she has never smoked. She has never used smokeless tobacco. She reports that she does not drink alcohol and does not use drugs. Family  History:  Family History  Problem Relation Age of Onset   Alcoholism Mother    Thyroid disease Mother    Stroke Mother    Hypertension Mother    Cancer Mother 12       "back cancer"   Other Mother        hysterectomy at 5 for "pre-cancerous cervical cells"   Colon polyps Father 57   Obesity Father    Cancer Father    Hypertension Father    Colon cancer Father 36   Cervical cancer Sister        full sister dx. unspecified age   Colon polyps Sister        unspecified number   Colon polyps Sister         paternal half-sister w/ colon polyps dx. age 25 or younger - unspecified number   Colon cancer Sister 7       paternal half-sister    Colon polyps Sister        paternal half-sister; unspecified number   Colon polyps Sister 34   Colon cancer Sister        paternal half-sister dx. before age 39; has had two colon cancers   Breast cancer Sister        paternal half-sister dx under age 72   Renal cancer Sister 42       paternal half-sister dx. w/ "LRC - lymphoid renal cancer"   Cancer Sister 47       paternal half-sister dx cancer of wrist that was not a skin cancer   Colon cancer Paternal Aunt        (x2) paternal aunts with colon cancer at unspecified ages; lim info   Breast cancer Paternal Aunt        (x3) paternal aunts with breast cancer at unspecified ages; lim info   Colon cancer Paternal Uncle        dx. unspecified age; lim info   Breast cancer Maternal Grandmother        unspecified age   Diabetes Maternal Grandmother    Diabetes Paternal Grandfather    Colon polyps Son 35       has had a "bleeding polyp"   Cervical cancer Other 24       niece; +hysterectomy   Alcoholism Other    Obesity Other    Esophageal cancer Neg Hx    Stomach cancer Neg Hx    Rectal cancer Neg Hx      HOME MEDICATIONS: Allergies as of 02/13/2024       Reactions   Amoxicillin Anaphylaxis   All "cillins" pt was 34-34 years old  Has patient had a PCN reaction causing immediate rash, facial/tongue/throat swelling, SOB or lightheadedness with hypotension: yes Has patient had a PCN reaction causing severe rash involving mucus membranes or skin necrosis: unknown Has patient had a PCN reaction that required hospitalization yes Has patient had a PCN reaction occurring within the last 10 years:no If all of the above answers are "NO", then may proceed with Cephalosporin use.   Ibuprofen Anaphylaxis, Hives   Keflex [cephalexin] Anaphylaxis   Penicillins Anaphylaxis   Has patient had a PCN reaction  causing immediate rash, facial/tongue/throat swelling, SOB or lightheadedness with hypotension:yes Has patient had a PCN reaction causing severe rash involving mucus membranes or skin necrosis: unknown Has patient had a PCN reaction that required hospitalization yes  Has patient had a PCN reaction occurring within the last 10 years: no If all of the  above answers are "NO", then may proceed with Cephalosporin use.   Baby Powder [methylbenzethonium] Hives   Doxycycline Diarrhea, Rash        Medication List        Accurate as of February 13, 2024 10:07 AM. If you have any questions, ask your nurse or doctor.          albuterol 108 (90 Base) MCG/ACT inhaler Commonly known as: Proventil HFA Inhale 2 puffs into the lungs every 6 (six) hours as needed for wheezing or shortness of breath.   Cequa 0.09 % Soln Generic drug: cycloSPORINE (PF) Apply 1 drop to eye 2 (two) times daily.   CULTURELLE PROBIOTICS PO Take 1 tablet by mouth daily at 6 (six) AM.   docusate 60 MG/15ML syrup Commonly known as: COLACE Take 60 mg by mouth daily.   EPINEPHrine 0.3 mg/0.3 mL Soaj injection Commonly known as: EpiPen 2-Pak Inject 0.3 mg into the muscle as needed for anaphylaxis.   furosemide 20 MG tablet Commonly known as: LASIX Take 0.5 tablets (10 mg total) by mouth daily.   Latisse 0.03 % ophthalmic solution Generic drug: bimatoprost Apply 1 drop via applicator once a day as directed   metFORMIN 500 MG tablet Commonly known as: GLUCOPHAGE 1 po with lunch daily   methimazole 5 MG tablet Commonly known as: TAPAZOLE Take 0.5 tablets (2.5 mg total) by mouth as directed. Half a tablet 4 days a week   Mounjaro 15 MG/0.5ML Pen Generic drug: tirzepatide SMARTSIG:1 pre-filled pen syringe SUB-Q Once a Week   MULTIVITAMIN PO Take 1 tablet by mouth daily at 6 (six) AM.   pantoprazole 40 MG tablet Commonly known as: PROTONIX Take 1 tablet (40 mg total) by mouth daily. Disregard the twice daily  dosing prescription. What changed: additional instructions   promethazine 25 MG tablet Commonly known as: PHENERGAN Take 1 tablet (25 mg total) by mouth every 6 (six) hours as needed for nausea or vomiting.   Vitamin D3 25 MCG (1000 UT) Caps Pt states she takes 2000IU qd          OBJECTIVE:   PHYSICAL EXAM: VS: BP 118/70 (BP Location: Left Arm, Patient Position: Sitting, Cuff Size: Small)   Pulse 84   Ht 5\' 1"  (1.549 m)   Wt 137 lb 9.6 oz (62.4 kg)   SpO2 99%   BMI 26.00 kg/m     Filed Weights   02/13/24 1006  Weight: 137 lb 9.6 oz (62.4 kg)      EXAM: General: Pt appears well and is in NAD  Neck: General: Supple without adenopathy. Thyroid: Thyroid size normal.  No goiter or nodules appreciated.   Lungs: Clear with good BS bilat   Heart: Auscultation: RRR.  Abdomen:  soft, nontender  Extremities:  BL LE: No pretibial edema   Mental Status: Judgment, insight: Intact Orientation: Oriented to time, place, and person Mood and affect: No depression, anxiety, or agitation     DATA REVIEWED:   Latest Reference Range & Units 02/13/24 10:21  TSH mIU/L 1.10  T4,Free(Direct) 0.8 - 1.8 ng/dL 1.4      Latest Reference Range & Units 11/16/21 08:21  TRAB <=2.00 IU/L 2.66 (H)    Latest Reference Range & Units 12/03/22 09:10  Sodium 134 - 144 mmol/L 141  Potassium 3.5 - 5.2 mmol/L 4.8  Chloride 96 - 106 mmol/L 102  CO2 20 - 29 mmol/L 23  Glucose 70 - 99 mg/dL 81  BUN 6 - 24 mg/dL 8  Creatinine 0.57 - 1.00 mg/dL 0.10  Calcium 8.7 - 27.2 mg/dL 53.6  BUN/Creatinine Ratio 9 - 23  10  eGFR >59 mL/min/1.73 85  Alkaline Phosphatase 44 - 121 IU/L 105  Albumin 3.8 - 4.9 g/dL 4.9  Albumin/Globulin Ratio 1.2 - 2.2  2.1  AST 0 - 40 IU/L 30  ALT 0 - 32 IU/L 50 (H)  Total Protein 6.0 - 8.5 g/dL 7.2  Total Bilirubin 0.0 - 1.2 mg/dL 0.5     Thyroid ultrasound 10/18/2022  Parenchymal Echotexture: Mildly heterogenous   Isthmus: 0.3 cm   Right lobe: 3.7 x 1.6 x  1.6 cm   Left lobe: 4.4 x 1.1 x 1.3 cm   _________________________________________________________   Estimated total number of nodules >/= 1 cm: 0   Number of spongiform nodules >/=  2 cm not described below (TR1): 0   Number of mixed cystic and solid nodules >/= 1.5 cm not described below (TR2): 0   _________________________________________________________   No > 1 cm nodules are seen within the thyroid gland.   Additional nodules, such as labeled # 1 (0.6 cm RIGHT inferior TR-4) and # 2 (0.7 cm LEFT mid TR-4) do not meet threshold for follow-up nor biopsy per current criteria.   No cervical adenopathy or abnormal fluid collection within the imaged neck.   IMPRESSION: 1. Heterogeneity of the thyroid gland without > 1 cm nodule. 2. Two (2) sub-1 cm nodules do not meet threshold for follow-up nor biopsy per current criteria.  ASSESSMENT / PLAN / RECOMMENDATIONS:   Hyperthyroidism:  -Patient is clinically euthyroid -Her hyperthyroidism is due to Graves' disease as there was slight elevation in TRAb -TFTs remain within normal range, no change  Medications   Continue methimazole 5 mg, half a tablet 4 days a week   2.  Graves' disease  -Per patient she has been diagnosed with Graves' orbitopathy   3.  Multinodular goiter:  -Thyroid ultrasound 10/2021 showed multiple nodules that did not meet criteria for biopsy, repeat 10/2022 showed stability  -Will proceed with repeat ultrasound this year    Follow-up in 6 months    Signed electronically by: Lyndle Herrlich, MD  [facility] Endocrinology  [facility] [address] [place], [place] [address] Phone: [phone] FAX: [phone]      CC: Shade Flood, MD [address] [place] [address] Phone: [phone]  Fax: [phone]   Return to Endocrinology clinic as below: Future Appointments  Date Time Provider Department Center  02/13/2024 10:10 AM  [name], Konrad Dolores, MD [facility] None  02/19/2024  8:10 AM [facility] MM 2 [facility]  03/03/2024  8:20 AM Opalski, Gavin Pound, DO [facility] None

## 2024-02-14 ENCOUNTER — Encounter: Payer: Self-pay | Admitting: Internal Medicine

## 2024-02-14 MED ORDER — METHIMAZOLE 5 MG PO TABS: 2.5000 mg | ORAL_TABLET | ORAL | 3 refills | Status: DC

## 2024-02-19 ENCOUNTER — Telehealth: Payer: Self-pay

## 2024-02-19 ENCOUNTER — Other Ambulatory Visit: Payer: Self-pay | Admitting: Family Medicine

## 2024-02-19 ENCOUNTER — Ambulatory Visit
Admission: RE | Admit: 2024-02-19 | Discharge: 2024-02-19 | Disposition: A | Discharge status: Home / Self Care | Source: Ambulatory Visit | Attending: Family Medicine | Admitting: Family Medicine

## 2024-02-19 DIAGNOSIS — Z1231 Encounter for screening mammogram for malignant neoplasm of breast: Secondary | ICD-10-CM

## 2024-02-19 DIAGNOSIS — N644 Mastodynia: Secondary | ICD-10-CM

## 2024-02-19 NOTE — Telephone Encounter (Signed)
 Noted. Chart reviewed. Mammogram 12/19/22. Screening mammogram was recommended last year, [name] scheduled on 01/31/24 for screening mammogram today.  I called the breast center of [facility], spoke with Aden Agreste in scheduling.  Reason for cancellation of screening mammogram today was that patient stated she had focal pain in left breast, which would change that to a diagnostic mammogram need.  Aden Agreste did note that this question is asked when initially scheduling if they do have any symptoms, so not sure if that changed from the time of scheduling to today.  Called patient. She states she called here a week and a half ago - and told someone here that she needed a diagnostic mammogram. No phone call noted.  She also noted that she had discussed this with the person who scheduled her screening mammogram and they had advised that she need to contact her doctor for a diagnostic exam.  Unfortunately I do not see a record of any calls at this time.  Pain in both breasts past few months, worse in left breast - lateral left breast past several months. No new lumps, but no self exam. Chronic discharge from nipples at times, no recent changes and no nipple bleeding. No new rash/redness.   I will order a diagnostic mammogram, bilateral for bilateral breast pain, left greater than right.  If other imaging deemed necessary by radiologist, I can sign off on paperwork from [facility].  Plan discussed with patient with understanding expressed.

## 2024-02-19 NOTE — Addendum Note (Signed)
 Addended by: Cainan Trull R on: 02/19/2024 01:16 PM   Modules accepted: Orders

## 2024-02-19 NOTE — Telephone Encounter (Signed)
 Copied from CRM [id number]. Topic: Referral - Status >> Feb 19, 2024  8:10 AM Marlan Silva wrote: Reason for CRM: Patient called in stating that she needed a diagnostic mammogram, and that the wrong order was sent. She was unable to get her mammogram done today and she is extremely upset. Patient is requesting a call back once the correct order is placed in her chart.

## 2024-02-19 NOTE — Telephone Encounter (Signed)
 I do not see where we ordered this at all for this year? Do you recall signing paper order for patient? I am not finding reference to ordering this exam at this time.

## 2024-02-20 ENCOUNTER — Ambulatory Visit

## 2024-02-20 ENCOUNTER — Ambulatory Visit
Admission: RE | Admit: 2024-02-20 | Discharge: 2024-02-20 | Disposition: A | Discharge status: Home / Self Care | Source: Ambulatory Visit | Attending: Internal Medicine | Admitting: Internal Medicine

## 2024-02-20 ENCOUNTER — Ambulatory Visit
Admission: RE | Admit: 2024-02-20 | Discharge: 2024-02-20 | Disposition: A | Discharge status: Home / Self Care | Source: Ambulatory Visit | Attending: Family Medicine | Admitting: Family Medicine

## 2024-02-20 DIAGNOSIS — N644 Mastodynia: Secondary | ICD-10-CM | POA: Diagnosis not present

## 2024-02-20 DIAGNOSIS — E042 Nontoxic multinodular goiter: Secondary | ICD-10-CM | POA: Diagnosis not present

## 2024-02-20 DIAGNOSIS — Z1231 Encounter for screening mammogram for malignant neoplasm of breast: Secondary | ICD-10-CM

## 2024-02-24 ENCOUNTER — Encounter: Payer: Self-pay | Admitting: Internal Medicine

## 2024-02-25 DIAGNOSIS — R300 Dysuria: Secondary | ICD-10-CM | POA: Diagnosis not present

## 2024-02-26 ENCOUNTER — Telehealth (INDEPENDENT_AMBULATORY_CARE_PROVIDER_SITE_OTHER): Payer: Self-pay | Admitting: Family Medicine

## 2024-02-26 ENCOUNTER — Telehealth: Payer: Self-pay | Admitting: Family Medicine

## 2024-02-26 ENCOUNTER — Other Ambulatory Visit (INDEPENDENT_AMBULATORY_CARE_PROVIDER_SITE_OTHER): Payer: Self-pay | Admitting: Family Medicine

## 2024-02-26 DIAGNOSIS — R7303 Prediabetes: Secondary | ICD-10-CM

## 2024-02-26 DIAGNOSIS — E559 Vitamin D deficiency, unspecified: Secondary | ICD-10-CM

## 2024-02-26 DIAGNOSIS — E7849 Other hyperlipidemia: Secondary | ICD-10-CM

## 2024-02-26 NOTE — Telephone Encounter (Signed)
 Placed in your sign folder

## 2024-02-26 NOTE — Telephone Encounter (Signed)
 04/23 pt want to see if she can do blood work before her appt due to scheduling conflict, wants to know ifshe could receive a call. Not active on mychart

## 2024-02-26 NOTE — Telephone Encounter (Signed)
 Paperwork completed and placed in fax bin at back nurse station

## 2024-02-26 NOTE — Telephone Encounter (Signed)
 Called patient and informed her that the order for her labs has been placed in her chart and she can come in the morning for her labs. I informed that she needs to be fasting. Pt verbalized understanding. Pt had no questions at this time but was encouraged to call back if questions arise.

## 2024-02-26 NOTE — Telephone Encounter (Signed)
 The breast center of [facility] faxed over a document that needs review, signature and faxed back to its designated area.  Please advise, Thanks  FAX # [phone]

## 2024-02-27 DIAGNOSIS — R7303 Prediabetes: Secondary | ICD-10-CM | POA: Diagnosis not present

## 2024-02-27 DIAGNOSIS — E559 Vitamin D deficiency, unspecified: Secondary | ICD-10-CM | POA: Diagnosis not present

## 2024-02-27 DIAGNOSIS — E7849 Other hyperlipidemia: Secondary | ICD-10-CM | POA: Diagnosis not present

## 2024-02-28 LAB — COMPREHENSIVE METABOLIC PANEL WITH GFR
ALT: 21 IU/L (ref 0–32)
AST: 21 IU/L (ref 0–40)
Albumin: 4.6 g/dL (ref 3.8–4.9)
Alkaline Phosphatase: 94 IU/L (ref 44–121)
BUN/Creatinine Ratio: 14 (ref 9–23)
BUN: 12 mg/dL (ref 6–24)
Bilirubin Total: 0.3 mg/dL (ref 0.0–1.2)
CO2: 25 mmol/L (ref 20–29)
Calcium: 10 mg/dL (ref 8.7–10.2)
Chloride: 102 mmol/L (ref 96–106)
Creatinine, Ser: 0.83 mg/dL (ref 0.57–1.00)
Globulin, Total: 1.7 g/dL (ref 1.5–4.5)
Glucose: 91 mg/dL (ref 70–99)
Potassium: 4.6 mmol/L (ref 3.5–5.2)
Sodium: 142 mmol/L (ref 134–144)
Total Protein: 6.3 g/dL (ref 6.0–8.5)
eGFR: 83 mL/min/{1.73_m2} (ref >59)

## 2024-02-28 LAB — LIPID PANEL
Chol/HDL Ratio: 3.9 ratio (ref 0.0–4.4)
Cholesterol, Total: 228 mg/dL — ABNORMAL HIGH (ref 100–199)
HDL: 58 mg/dL (ref >39)
LDL Chol Calc (NIH): 151 mg/dL — ABNORMAL HIGH (ref 0–99)
Triglycerides: 106 mg/dL (ref 0–149)
VLDL Cholesterol Cal: 19 mg/dL (ref 5–40)

## 2024-02-28 LAB — HEMOGLOBIN A1C
Est. average glucose Bld gHb Est-mCnc: 108 mg/dL
Hgb A1c MFr Bld: 5.4 % (ref 4.8–5.6)

## 2024-02-28 LAB — VITAMIN D 25 HYDROXY (VIT D DEFICIENCY, FRACTURES): Vit D, 25-Hydroxy: 57.9 ng/mL (ref 30.0–100.0)

## 2024-03-02 ENCOUNTER — Ambulatory Visit (INDEPENDENT_AMBULATORY_CARE_PROVIDER_SITE_OTHER): Admitting: Family Medicine

## 2024-03-03 ENCOUNTER — Ambulatory Visit (INDEPENDENT_AMBULATORY_CARE_PROVIDER_SITE_OTHER): Admitting: Family Medicine

## 2024-03-04 ENCOUNTER — Other Ambulatory Visit: Payer: Self-pay | Admitting: Family Medicine

## 2024-03-04 ENCOUNTER — Other Ambulatory Visit (INDEPENDENT_AMBULATORY_CARE_PROVIDER_SITE_OTHER): Payer: Self-pay | Admitting: Family Medicine

## 2024-03-04 DIAGNOSIS — T753XXD Motion sickness, subsequent encounter: Secondary | ICD-10-CM

## 2024-03-04 DIAGNOSIS — R7303 Prediabetes: Secondary | ICD-10-CM

## 2024-03-04 MED ORDER — METFORMIN HCL 500 MG PO TABS: ORAL_TABLET | ORAL | 0 refills | Status: DC

## 2024-03-04 NOTE — Telephone Encounter (Signed)
 Copied from CRM [id number]. Topic: Clinical - Medication Refill >> Mar 04, 2024  3:23 PM Danae Duncans wrote: Most Recent Primary Care Visit:  Provider: Jarold Merlin B  Department: [facility]  Visit Type: OFFICE VISIT  Date: 10/23/2022  Medication: promethazine  (PHENERGAN ) 25 MG tablet  Has the patient contacted their pharmacy? Yes (Agent: If no, request that the patient contact the pharmacy for the refill. If patient does not wish to contact the pharmacy document the reason why and proceed with request.) (Agent: If yes, when and what did the pharmacy advise?)  Is this the correct pharmacy for this prescription? Yes If no, delete pharmacy and type the correct one.  This is the patient's preferred pharmacy:  [facility] [address], [place] - [address] [place] [address] Phone: [phone] Fax: [phone]   Has the prescription been filled recently? No  Is the patient out of the medication? Yes- Pt is going out of town and is needing the medication asap  Has the patient been seen for an appointment in the last year OR does the patient have an upcoming appointment? Yes  Can we respond through MyChart? Yes  Agent: Please be advised that Rx refills may take up to 3 business days. We ask that you follow-up with your pharmacy.

## 2024-03-05 ENCOUNTER — Telehealth: Payer: Self-pay

## 2024-03-05 ENCOUNTER — Other Ambulatory Visit: Payer: Self-pay

## 2024-03-05 ENCOUNTER — Other Ambulatory Visit: Payer: Self-pay | Admitting: Family Medicine

## 2024-03-05 DIAGNOSIS — K760 Fatty (change of) liver, not elsewhere classified: Secondary | ICD-10-CM | POA: Diagnosis not present

## 2024-03-05 DIAGNOSIS — R7303 Prediabetes: Secondary | ICD-10-CM | POA: Diagnosis not present

## 2024-03-05 DIAGNOSIS — E039 Hypothyroidism, unspecified: Secondary | ICD-10-CM | POA: Diagnosis not present

## 2024-03-05 DIAGNOSIS — N809 Endometriosis, unspecified: Secondary | ICD-10-CM | POA: Diagnosis not present

## 2024-03-05 DIAGNOSIS — N182 Chronic kidney disease, stage 2 (mild): Secondary | ICD-10-CM | POA: Diagnosis not present

## 2024-03-05 DIAGNOSIS — T753XXD Motion sickness, subsequent encounter: Secondary | ICD-10-CM

## 2024-03-05 NOTE — Telephone Encounter (Signed)
 Copied from CRM [id number]. Topic: Clinical - Medication Refill >> Mar 04, 2024  3:23 PM Danae Duncans wrote: Most Recent Primary Care Visit:  Provider: Jarold Merlin B  Department: [facility]  Visit Type: OFFICE VISIT  Date: 10/23/2022  Medication: promethazine  (PHENERGAN ) 25 MG tablet  Has the patient contacted their pharmacy? Yes (Agent: If no, request that the patient contact the pharmacy for the refill. If patient does not wish to contact the pharmacy document the reason why and proceed with request.) (Agent: If yes, when and what did the pharmacy advise?)  Is this the correct pharmacy for this prescription? Yes If no, delete pharmacy and type the correct one.  This is the patient's preferred pharmacy:  [facility] [address] [place] - [address] [place] [address] Phone: [phone] Fax: [phone]   Has the prescription been filled recently? No  Is the patient out of the medication? Yes- Pt is going out of town and is needing the medication asap  Has the patient been seen for an appointment in the last year OR does the patient have an upcoming appointment? Yes  Can we respond through MyChart? Yes  Agent: Please be advised that Rx refills may take up to 3 business days. We ask that you follow-up with your pharmacy. >> Mar 05, 2024  1:42 PM Allyne Areola wrote: Patient needs the refill urgently, she is going out of town tomorrow.

## 2024-03-06 ENCOUNTER — Telehealth: Payer: Self-pay

## 2024-03-06 NOTE — Telephone Encounter (Signed)
 This was sent in for patient but she does need a follow up with Dr. Ester Helms she is overdue.

## 2024-03-06 NOTE — Telephone Encounter (Signed)
 Phenergan  has been sent in for the patient. Please call this patient and get her scheduled for an appt with Dr. Ester Helms. She is well overdue for a follow up with him.

## 2024-03-23 ENCOUNTER — Telehealth: Payer: Self-pay | Admitting: Gastroenterology

## 2024-03-23 MED ORDER — PANTOPRAZOLE SODIUM 40 MG PO TBEC: 40.0000 mg | DELAYED_RELEASE_TABLET | Freq: Every day | ORAL | 3 refills | Status: DC

## 2024-03-23 NOTE — Telephone Encounter (Signed)
 Patient was last seen 09/2023 for colonoscopy with Dr. Sandrea Cruel. Patient is requesting refill of pantoprazole . Dr. Karene Oto, you are DOD. Can we refill pantoprazole ?

## 2024-03-23 NOTE — Telephone Encounter (Signed)
 Error

## 2024-03-23 NOTE — Telephone Encounter (Signed)
 PT is calling to have a refill for pantoprazole  to be sent to [facility]. She also stated that she will be calling in the end of the week to make a decision on the provider she would like to take over her care.

## 2024-03-23 NOTE — Telephone Encounter (Signed)
 Prescription sent to patient's pharmacy.

## 2024-03-26 ENCOUNTER — Ambulatory Visit: Admitting: Family Medicine

## 2024-04-03 ENCOUNTER — Telehealth: Payer: Self-pay

## 2024-04-03 DIAGNOSIS — M25512 Pain in left shoulder: Secondary | ICD-10-CM

## 2024-04-03 NOTE — Telephone Encounter (Signed)
 Patient has not been since since 10/17/2018 for a sick visit. Patient has an appointment scheduled for 05/11/2024.  Patient is looking to get a referral for Dr.Landall at [facility] and [facility] Specialist for cortisone shots.

## 2024-04-03 NOTE — Telephone Encounter (Signed)
 Copied from CRM [id number]. Topic: Referral - Prior Authorization Question >> Apr 03, 2024  9:40 AM Dimple Francis wrote: Reason for CRM: Patient looking to get a referral for  Dr Jesslyn Moro at [facility] and [facility] for a cortisone shot [address] #[phone]

## 2024-04-06 DIAGNOSIS — H53483 Generalized contraction of visual field, bilateral: Secondary | ICD-10-CM | POA: Diagnosis not present

## 2024-04-06 NOTE — Telephone Encounter (Signed)
 Noted.  She has been under the care of medical weight loss specialist with ongoing lab work.  Can review chronic medical conditions at her upcoming July 7 appointment.  Called patient regarding request for referral. No answer - will try to reach her again tonight or tomorrow to review request and specifics for that referral.

## 2024-04-07 NOTE — Telephone Encounter (Signed)
 Spoke with patient. Has been having L shoulder issues from lifting.   Hurts to lift, sleep on shoulder. For years. No recent injury/trauma.  Some lifting with job for years.  Patient of Dr. Agatha Horsfall, has seen him for other concerns and would like to follow back up with him to evaluate shoulder.  Referral placed.

## 2024-04-09 ENCOUNTER — Encounter: Payer: Self-pay | Admitting: Ophthalmology

## 2024-04-09 ENCOUNTER — Other Ambulatory Visit: Payer: Self-pay | Admitting: Ophthalmology

## 2024-04-09 DIAGNOSIS — H53483 Generalized contraction of visual field, bilateral: Secondary | ICD-10-CM | POA: Diagnosis not present

## 2024-04-09 DIAGNOSIS — H02423 Myogenic ptosis of bilateral eyelids: Secondary | ICD-10-CM | POA: Diagnosis not present

## 2024-04-09 DIAGNOSIS — H0279 Other degenerative disorders of eyelid and periocular area: Secondary | ICD-10-CM | POA: Diagnosis not present

## 2024-04-09 DIAGNOSIS — H57813 Brow ptosis, bilateral: Secondary | ICD-10-CM | POA: Diagnosis not present

## 2024-04-09 DIAGNOSIS — H02422 Myogenic ptosis of left eyelid: Secondary | ICD-10-CM | POA: Diagnosis not present

## 2024-04-09 DIAGNOSIS — E0500 Thyrotoxicosis with diffuse goiter without thyrotoxic crisis or storm: Secondary | ICD-10-CM | POA: Diagnosis not present

## 2024-04-09 DIAGNOSIS — H02831 Dermatochalasis of right upper eyelid: Secondary | ICD-10-CM | POA: Diagnosis not present

## 2024-04-09 DIAGNOSIS — H02834 Dermatochalasis of left upper eyelid: Secondary | ICD-10-CM | POA: Diagnosis not present

## 2024-04-09 DIAGNOSIS — H02421 Myogenic ptosis of right eyelid: Secondary | ICD-10-CM | POA: Diagnosis not present

## 2024-04-09 DIAGNOSIS — D485 Neoplasm of uncertain behavior of skin: Secondary | ICD-10-CM | POA: Diagnosis not present

## 2024-04-09 DIAGNOSIS — Z01818 Encounter for other preprocedural examination: Secondary | ICD-10-CM | POA: Diagnosis not present

## 2024-04-09 DIAGNOSIS — H05241 Constant exophthalmos, right eye: Secondary | ICD-10-CM | POA: Diagnosis not present

## 2024-04-10 DIAGNOSIS — M542 Cervicalgia: Secondary | ICD-10-CM | POA: Diagnosis not present

## 2024-04-10 DIAGNOSIS — M7502 Adhesive capsulitis of left shoulder: Secondary | ICD-10-CM | POA: Diagnosis not present

## 2024-04-13 DIAGNOSIS — E0500 Thyrotoxicosis with diffuse goiter without thyrotoxic crisis or storm: Secondary | ICD-10-CM | POA: Diagnosis not present

## 2024-04-14 ENCOUNTER — Ambulatory Visit
Admission: RE | Admit: 2024-04-14 | Discharge: 2024-04-14 | Disposition: A | Discharge status: Home / Self Care | Source: Ambulatory Visit | Attending: Ophthalmology | Admitting: Ophthalmology

## 2024-04-14 DIAGNOSIS — R519 Headache, unspecified: Secondary | ICD-10-CM | POA: Diagnosis not present

## 2024-04-14 DIAGNOSIS — E0500 Thyrotoxicosis with diffuse goiter without thyrotoxic crisis or storm: Secondary | ICD-10-CM

## 2024-04-14 DIAGNOSIS — H05241 Constant exophthalmos, right eye: Secondary | ICD-10-CM | POA: Diagnosis not present

## 2024-04-27 ENCOUNTER — Encounter (INDEPENDENT_AMBULATORY_CARE_PROVIDER_SITE_OTHER): Payer: Self-pay | Admitting: Family Medicine

## 2024-04-27 ENCOUNTER — Ambulatory Visit (INDEPENDENT_AMBULATORY_CARE_PROVIDER_SITE_OTHER): Admitting: Family Medicine

## 2024-04-27 VITALS — BP 112/73 | HR 65 | Temp 97.7°F | Ht 61.0 in | Wt 131.0 lb

## 2024-04-27 DIAGNOSIS — E559 Vitamin D deficiency, unspecified: Secondary | ICD-10-CM

## 2024-04-27 DIAGNOSIS — E7849 Other hyperlipidemia: Secondary | ICD-10-CM

## 2024-04-27 DIAGNOSIS — Z6824 Body mass index (BMI) 24.0-24.9, adult: Secondary | ICD-10-CM | POA: Diagnosis not present

## 2024-04-27 DIAGNOSIS — R7303 Prediabetes: Secondary | ICD-10-CM

## 2024-04-27 MED ORDER — VITAMIN D3 25 MCG (1000 UT) PO CAPS
ORAL_CAPSULE | ORAL | Status: AC
Start: 2024-04-27 — End: ?

## 2024-04-27 MED ORDER — METFORMIN HCL 500 MG PO TABS
ORAL_TABLET | ORAL | 0 refills | Status: DC
Start: 2024-04-27 — End: 2024-06-22

## 2024-04-27 NOTE — Progress Notes (Signed)
 [name], D.O.  ABFM, ABOM Specializing in Clinical Bariatric Medicine  Office located at: [address] [place], [place]  [address]   Assessment and Plan:   Medications Discontinued During This Encounter  Medication Reason   Cholecalciferol (VITAMIN D3) 25 MCG (1000 UT) CAPS Reorder   metFORMIN  (GLUCOPHAGE ) 500 MG tablet Reorder     Meds ordered this encounter  Medications   Cholecalciferol (VITAMIN D3) 25 MCG (1000 UT) CAPS    Sig: Pt states she takes 2000IU every day-->  change to 2,000 IU every other day during the summer months   metFORMIN  (GLUCOPHAGE ) 500 MG tablet    Sig: 1 po with lunch daily    Dispense:  90 tablet    Refill:  0    Do not send RF request     FOR THE DISEASE OF OBESITY:  BMI 24.0-24.9, adult-current 24.75 Morbid obesity (HCC)-start bmi 35.90/date 11/29/21 Assessment & Plan: Since last office visit on 01/08/2024 patient's  Muscle mass has decreased by 0.4 lb. Fat mass has increased by 2.8 lb. Total body water  has decreased by 1.8 lb.  Counseling done on how various foods will affect these numbers and how to maximize success  Total lbs lost to date: 59 lbs  Total weight loss percentage to date: 31.05%    Recommended Dietary Goals [name] is currently in the action stage of change. As such, her goal is to continue weight management plan.  She has agreed to: continue current plan   Behavioral Intervention We discussed the following today: increasing water  intake  and continue to work on implementation of reduced calorie nutritional plan, reducing saturated and trans fats in her diets.   Additional resources provided today: None  Evidence-based interventions for health behavior change were utilized today including the discussion of self monitoring techniques, problem-solving barriers and SMART goal setting techniques.   Regarding patient's less desirable eating habits and patterns, we employed the technique of small changes.   Pt will  specifically work on: n/a    Recommended Physical Activity Goals [name] has been advised to work up to 300-450 minutes of moderate intensity aerobic activity a week and strengthening exercises 2-3 times per week for cardiovascular health, weight loss maintenance and preservation of muscle mass.   She has agreed to : Continue current level of physical activity    Pharmacotherapy Continue same regimen.    ASSOCIATED CONDITIONS ADDRESSED TODAY:   Prediabetes Assessment & Plan: Lab Results  Component Value Date   HGBA1C 5.4 02/27/2024   HGBA1C 5.6 09/23/2023   HGBA1C 5.6 04/25/2023   INSULIN  5.8 09/23/2023   INSULIN  11.9 04/25/2023   INSULIN  6.7 12/03/2022   Lab Results  Component Value Date   CREATININE 0.83 02/27/2024   BUN 12 02/27/2024   NA 142 02/27/2024   K 4.6 02/27/2024   CL 102 02/27/2024   CO2 25 02/27/2024      Component Value Date/Time   PROT 6.3 02/27/2024 0750   ALBUMIN 4.6 02/27/2024 0750   AST 21 02/27/2024 0750   ALT 21 02/27/2024 0750   ALKPHOS 94 02/27/2024 0750   BILITOT 0.3 02/27/2024 0750   BILIDIR 0.1 04/20/2008 1145    Relevant medications: Metformin  500 mg daily and Mounjaro 15 mg wkly (was off Mounjaro for a few weeks d/t difficulties obtaining it but is now back on it). Tolerating both medicines well, no GI SE. Good control of hunger and cravings. Lab wise, her A1c, fasting insulin , kidney function, electrolytes, and  liver enzymes are essentially at goal.   Continue current medicines, exercise regimen and balanced diet focusing on protein, fruits, and vegetables while limiting simple carbohydrates.     Vitamin D  deficiency Assessment & Plan: Lab Results  Component Value Date   VD25OH 57.9 02/27/2024   VD25OH 49.1 09/23/2023   VD25OH 51.9 04/25/2023   On OTC VD 2,000 lU daily. Her VD is at goal.Pt instructed to take OTC VD 2,000 lU every other day  during the summer months only. Recheck: 1-2 months.     Other  hyperlipidemia Assessment & Plan: Lab Results  Component Value Date   CHOL 228 (H) 02/27/2024   HDL 58 02/27/2024   LDLCALC 151 (H) 02/27/2024   TRIG 106 02/27/2024   CHOLHDL 3.9 02/27/2024   The 10-year ASCVD risk score (Arnett DK, et al., 2019) is: 3.2%   Values used to calculate the score:     Age: 53 years     Clincally relevant sex: Female     Is Non-Hispanic African American: No     Diabetic: Yes     Tobacco smoker: No     Systolic Blood Pressure: 112 mmHg     Is BP treated: No     HDL Cholesterol: 58 mg/dL     Total Cholesterol: 228 mg/dL  Does not want to be on cholesterol medications. Her ASCVD score is likely lower than 3.2% as she is not a diabetic. Her LDL improved from 155 to 151; states she's been very mindful of decreasing her saturated and trans fat intake.   Continue to work on nutrition plan -decreasing simple carbohydrates, increasing lean proteins, decreasing saturated and cholesterol , avoiding trans fats and exercise as able to improve lipids. Recheck lipid panel in 6 months.     Follow up:   Return 06/22/2024 at 9:00 AM. She was informed of the importance of frequent follow up visits to maximize her success with intensive lifestyle modifications for her multiple health conditions.   Subjective:   Chief complaint: Obesity [name] is here to discuss her progress with her obesity treatment plan. She is  keeping a food journal and adhering to recommended goals of [phone] calories and 80+ g of protein and states she is following her eating plan approximately 100% of the time.    Interval History:  [name] is here for a follow up office visit. Since last OV on 01/08/2024, [name] is down 3 lbs. States she has been diligently working on reducing her processed sugar, saturated fat, and trans fat intake. Is walking 16 miles for 3 hrs 7 days a week.    Pharmacotherapy that aid with weight loss: Metformin  500 mg daily and Mounjaro 15 mg wkly.     Weight Summary and Biometrics   Weight Lost Since Last Visit: 3lb  Weight Gained Since Last Visit: 0lb   Vitals Temp: 97.7 F (36.5 C) BP: 112/73 Pulse Rate: 65 SpO2: 98 %   Anthropometric Measurements Height: 5' 1 (1.549 m) Weight: 131 lb (59.4 kg) BMI (Calculated): 24.76 Weight at Last Visit: 134lb Weight Lost Since Last Visit: 3lb Weight Gained Since Last Visit: 0lb Starting Weight: 190lb Total Weight Loss (lbs): 59 lb (26.8 kg)   Body Composition  Body Fat %: 32.4 % Fat Mass (lbs): 42.6 lbs Muscle Mass (lbs): 84.2 lbs Total Body Water  (lbs): 58.6 lbs Visceral Fat Rating : 7   Other Clinical Data Fasting: No Labs: No Today's Visit #: 31 Starting Date: 11/29/21    Objective:  PHYSICAL EXAM: Blood pressure 112/73, pulse 65, temperature 97.7 F (36.5 C), height 5' 1 (1.549 m), weight 131 lb (59.4 kg), SpO2 98%. Body mass index is 24.75 kg/m.  General: she is overweight, cooperative and in no acute distress. PSYCH: Has normal mood, affect and thought process.   HEENT: EOMI, sclerae are anicteric. Lungs: Normal breathing effort, no conversational dyspnea. Extremities: Moves * 4 Neurologic: A and O * 3, good insight  DIAGNOSTIC DATA REVIEWED: BMET    Component Value Date/Time   NA 142 02/27/2024 0750   K 4.6 02/27/2024 0750   CL 102 02/27/2024 0750   CO2 25 02/27/2024 0750   GLUCOSE 91 02/27/2024 0750   GLUCOSE 83 01/02/2022 1216   BUN 12 02/27/2024 0750   CREATININE 0.83 02/27/2024 0750   CALCIUM 10.0 02/27/2024 0750   GFRNONAA >60 11/05/2019 0115   GFRAA >60 11/05/2019 0115   Lab Results  Component Value Date   HGBA1C 5.4 02/27/2024   HGBA1C 6.3 09/26/2021   Lab Results  Component Value Date   INSULIN  5.8 09/23/2023   INSULIN  7.7 05/24/2022   Lab Results  Component Value Date   TSH 1.10 02/13/2024   CBC    Component Value Date/Time   WBC 6.9 11/16/2021 0821   RBC 5.31 (H) 11/16/2021 0821   HGB 13.5 11/16/2021 0821    HCT 43.2 11/16/2021 0821   PLT 327.0 11/16/2021 0821   MCV 81.4 11/16/2021 0821   MCH 26.6 11/05/2019 0115   MCHC 31.3 11/16/2021 0821   RDW 15.3 11/16/2021 0821   Iron Studies No results found for: IRON, TIBC, FERRITIN, IRONPCTSAT Lipid Panel     Component Value Date/Time   CHOL 228 (H) 02/27/2024 0750   TRIG 106 02/27/2024 0750   HDL 58 02/27/2024 0750   CHOLHDL 3.9 02/27/2024 0750   CHOLHDL 4 09/26/2021 0759   VLDL 27.0 09/26/2021 0759   LDLCALC 151 (H) 02/27/2024 0750   Hepatic Function Panel     Component Value Date/Time   PROT 6.3 02/27/2024 0750   ALBUMIN 4.6 02/27/2024 0750   AST 21 02/27/2024 0750   ALT 21 02/27/2024 0750   ALKPHOS 94 02/27/2024 0750   BILITOT 0.3 02/27/2024 0750   BILIDIR 0.1 04/20/2008 1145      Component Value Date/Time   TSH 1.10 02/13/2024 1021   Nutritional Lab Results  Component Value Date   VD25OH 57.9 02/27/2024   VD25OH 49.1 09/23/2023   VD25OH 51.9 04/25/2023    Attestations:   I, Special Puri, acting as a Stage manager for [employer], DO., have compiled all relevant documentation for today's office visit on behalf of Barnie Jenkins, DO, while in the presence of [employer], DO.  Reviewed by clinician on day of visit: allergies, medications, problem list, medical history, surgical history, family history, social history, and previous encounter notes pertinent to patient's obesity diagnosis.  I have reviewed the above documentation for accuracy and completeness, and I agree with the above. Barnie JINNY Jenkins, D.O.  The 21st Century Cures Act was signed into law in 2016 which includes the topic of electronic health records.  This provides immediate access to information in MyChart. This includes consultation notes, operative notes, office notes, lab results and pathology reports.  If you have any questions about what you read please let us  know at your next visit so we can discuss your concerns and take corrective  action if need be.  We are right here with you.

## 2024-04-27 NOTE — Patient Instructions (Signed)
 The 10-year ASCVD risk score (Arnett DK, et al., 2019) is: 3.2%   Values used to calculate the score:     Age: 53 years     Clincally relevant sex: Female     Is Non-Hispanic African American: No     Diabetic: Yes     Tobacco smoker: No     Systolic Blood Pressure: 112 mmHg     Is BP treated: No     HDL Cholesterol: 58 mg/dL     Total Cholesterol: 228 mg/dL

## 2024-04-28 ENCOUNTER — Ambulatory Visit (INDEPENDENT_AMBULATORY_CARE_PROVIDER_SITE_OTHER): Admitting: Family Medicine

## 2024-05-11 ENCOUNTER — Ambulatory Visit: Admitting: Family Medicine

## 2024-05-12 ENCOUNTER — Telehealth: Payer: Self-pay | Admitting: Family Medicine

## 2024-05-12 DIAGNOSIS — R609 Edema, unspecified: Secondary | ICD-10-CM

## 2024-05-12 MED ORDER — FUROSEMIDE 20 MG PO TABS: 10.0000 mg | ORAL_TABLET | Freq: Every day | ORAL | 0 refills | Status: DC

## 2024-05-12 NOTE — Telephone Encounter (Signed)
 Sent refill as requested and called patient to inform her, also provided the Patient Experience number for her recent phone experience

## 2024-05-12 NOTE — Telephone Encounter (Signed)
 Encourage patient to contact the pharmacy for refills or they can request refills through [facility]  (Please schedule appointment if patient has not been seen in over a year)    WHAT PHARMACY WOULD THEY LIKE THIS SENT TO: [id number] - [place], [place] - [address]   MEDICATION NAME & DOSE: furosemide  (LASIX ) 20 MG tablet   NOTES/COMMENTS FROM PATIENT: Pt called [facility] on 05/07/24 to reschedule appt due to husband needing to go to ER, agent advised her that someone would return her call but there was never a message sent to us  regarding the message so pt was never called back. She has scheduled an appt on 8/11 unless there is a cancellation for a sooner appt      Front office please notify patient: It takes 48-72 hours to process rx refill requests Ask patient to call pharmacy to ensure rx is ready before heading there.

## 2024-05-12 NOTE — Addendum Note (Signed)
 Addended by: Callie Facey K on: 05/12/2024 08:36 AM   Modules accepted: Orders

## 2024-05-19 ENCOUNTER — Telehealth (INDEPENDENT_AMBULATORY_CARE_PROVIDER_SITE_OTHER): Payer: Self-pay | Admitting: Family Medicine

## 2024-05-19 NOTE — Telephone Encounter (Signed)
 Patient called stating that she needs her last labs sent to Toribio Minerva at [facility] in [place] . Patient states she needs her last labs sent today asap. Patient wants Dr. MALVA and/or her assistant to call her as soon as the labs are sent to [employer]. Please call the patient at the number on file.

## 2024-05-20 ENCOUNTER — Telehealth: Payer: Self-pay | Admitting: Family Medicine

## 2024-05-20 ENCOUNTER — Telehealth (INDEPENDENT_AMBULATORY_CARE_PROVIDER_SITE_OTHER): Payer: Self-pay

## 2024-05-20 NOTE — Telephone Encounter (Signed)
 Form was rcvd and fax to medical records.  Called patient to let her know that form has been faxed.     Copied from CRM [id number]. Topic: Clinical - Medical Advice >> May 20, 2024  2:45 PM Charolett L wrote: Reason for CRM: Davied from [employer] to request medical records and she was adv to fax over request  Cb# [phone] [phone]

## 2024-05-20 NOTE — Telephone Encounter (Signed)
 Type of form received: [employer] organiser - Requesting Medical Records  Additional comments:   Received by: Fax  Form should be Faxed/mailed to: (address/ fax #) [phone]  Is patient requesting call for pickup: N/A  Form placed:  Labeled & placed in provider bin  Attach charge sheet.  Provider will determine charge.  Individual made aware of 3-5 business day turn around? N/A

## 2024-05-20 NOTE — Telephone Encounter (Signed)
 Reviewed document and this is a medical release form and has been sent to Medical records for release of information

## 2024-05-20 NOTE — Telephone Encounter (Signed)
 Call pt to inform her that her labs result has been sent to Dr Toribio Minerva in [place] . Pt verbalized understanding. Pt had no questions at this time but was encouraged to call back if questions arise.

## 2024-05-22 NOTE — Telephone Encounter (Signed)
 Fax was originally received on 7/16 - sent to Medical Records department. They would need to reach out to Medical Records to follow-up, that request is not handled in the provider's office.  Medical Records # [phone]

## 2024-05-27 ENCOUNTER — Telehealth: Payer: Self-pay

## 2024-05-27 NOTE — Telephone Encounter (Signed)
 Please see message below in regards to question about status.   Copied from CRM #[id number]. Topic: General - Other >> May 27, 2024  9:09 AM Aleatha C wrote: Reason for CRM: [employer]  on behalf of [employer] want to check if the medical records were received and  if the medical authorization was HIPAA compliant [phone] ext [phone]

## 2024-05-28 NOTE — Telephone Encounter (Signed)
 Copied from CRM #[id number]. Topic: General - Other >> May 27, 2024  9:09 AM Aleatha C wrote: Reason for CRM: [employer]  on behalf of [employer] want to check if the medical records were received and  if the medical authorization was HIPAA compliant [phone] ext [phone] >> May 28, 2024 11:01 AM Rosina BIRCH wrote: Davied from [employer] called wanting to check the status of the medical records being sent to them CB [phone] ext [phone] Fax [phone]

## 2024-05-29 ENCOUNTER — Telehealth: Payer: Self-pay | Admitting: Family Medicine

## 2024-05-29 NOTE — Telephone Encounter (Signed)
 [employer] is calling for another update on records.

## 2024-05-29 NOTE — Telephone Encounter (Signed)
 Copied from CRM [id number]. Topic: Medical Record Request - Records Request >> May 29, 2024  2:36 PM Gennette ORN wrote: Reason for CRM: [name] [phone] calling status regarding medical records.

## 2024-06-01 NOTE — Telephone Encounter (Signed)
 The medical records fax that we have is [phone]

## 2024-06-01 NOTE — Telephone Encounter (Signed)
 Documents were sent 05/20/24 to medical records

## 2024-06-01 NOTE — Telephone Encounter (Signed)
 Copied from CRM [id number]. Topic: General - Call Back - No Documentation >> Jun 01, 2024 11:15 AM Rea BROCKS wrote:   Reason for CRM: Nillie from [employer] called to verify status on receipt of medical records. Provided her with the email from the messages today.

## 2024-06-01 NOTE — Telephone Encounter (Signed)
 Called patient and gave her the number for [employer] and Medical records.

## 2024-06-04 DIAGNOSIS — L820 Inflamed seborrheic keratosis: Secondary | ICD-10-CM | POA: Diagnosis not present

## 2024-06-04 DIAGNOSIS — L720 Epidermal cyst: Secondary | ICD-10-CM | POA: Diagnosis not present

## 2024-06-04 DIAGNOSIS — D485 Neoplasm of uncertain behavior of skin: Secondary | ICD-10-CM | POA: Diagnosis not present

## 2024-06-15 ENCOUNTER — Ambulatory Visit: Admitting: Family Medicine

## 2024-06-15 VITALS — BP 106/72 | HR 93 | Temp 98.5°F | Ht 60.0 in | Wt 132.8 lb

## 2024-06-15 DIAGNOSIS — R609 Edema, unspecified: Secondary | ICD-10-CM | POA: Diagnosis not present

## 2024-06-15 DIAGNOSIS — K219 Gastro-esophageal reflux disease without esophagitis: Secondary | ICD-10-CM | POA: Diagnosis not present

## 2024-06-15 DIAGNOSIS — Z0184 Encounter for antibody response examination: Secondary | ICD-10-CM | POA: Diagnosis not present

## 2024-06-15 DIAGNOSIS — R7303 Prediabetes: Secondary | ICD-10-CM

## 2024-06-15 DIAGNOSIS — Z8619 Personal history of other infectious and parasitic diseases: Secondary | ICD-10-CM | POA: Diagnosis not present

## 2024-06-15 DIAGNOSIS — E7849 Other hyperlipidemia: Secondary | ICD-10-CM

## 2024-06-15 MED ORDER — VALACYCLOVIR HCL 1 G PO TABS: 2000.0000 mg | ORAL_TABLET | Freq: Two times a day (BID) | ORAL | 1 refills | Status: AC

## 2024-06-15 MED ORDER — FUROSEMIDE 20 MG PO TABS: 10.0000 mg | ORAL_TABLET | Freq: Every day | ORAL | 3 refills | Status: AC

## 2024-06-15 MED ORDER — PANTOPRAZOLE SODIUM 40 MG PO TBEC
40.0000 mg | DELAYED_RELEASE_TABLET | Freq: Every day | ORAL | 3 refills | Status: AC
Start: 2024-06-15 — End: ?

## 2024-06-15 NOTE — Progress Notes (Signed)
 Subjective:  Patient ID: [name], female    DOB: 25-Nov-1970  Age: 53 y.o. MRN: [id number]  CC:  Chief Complaint  Patient presents with   Medication Refill    Here to get meds refilled.     HPI [name] presents for   Follow-up.   History of  prediabetes: Lab Results  Component Value Date   HGBA1C 5.4 02/27/2024   Wt Readings from Last 3 Encounters:  06/15/24 132 lb 12.8 oz (60.2 kg)  04/27/24 131 lb (59.4 kg)  02/13/24 137 lb 9.6 oz (62.4 kg)   Has also been under the care of healthy weight and wellness, Dr. Midge.  Treated with Mounjaro  15 mg weekly and metformin  500 mg daily. Uses colace for constipation.  Phenergan  if needed for n/v with motion sickness - hx of vertigo. Uses with travel. 3-4 per month.   Lab Results  Component Value Date   CHOL 228 (H) 02/27/2024   HDL 58 02/27/2024   LDLCALC 151 (H) 02/27/2024   TRIG 106 02/27/2024   CHOLHDL 3.9 02/27/2024   Hyperlipidemia: Not on meds, trying to not take meds. No early heart disease in family.  The 10-year ASCVD risk score (Arnett DK, et al., 2019) is: 2.9%   Values used to calculate the score:     Age: 46 years     Clincally relevant sex: Female     Is Non-Hispanic African American: No     Diabetic: Yes     Tobacco smoker: No     Systolic Blood Pressure: 106 mmHg     Is BP treated: No     HDL Cholesterol: 58 mg/dL     Total Cholesterol: 228 mg/dL  Lab Results  Component Value Date   CHOL 228 (H) 02/27/2024   HDL 58 02/27/2024   LDLCALC 151 (H) 02/27/2024   TRIG 106 02/27/2024   CHOLHDL 3.9 02/27/2024   Lab Results  Component Value Date   ALT 21 02/27/2024   AST 21 02/27/2024   ALKPHOS 94 02/27/2024   BILITOT 0.3 02/27/2024      GERD: Plans to establish with new GI - protonix  daily - stable control.   Dependent edema Last discussed October 2023.  History of hypothyroidism followed by endocrinology, on methimazole . furosemide  has been used for dependent  edema with history of solitary kidney status post nephrectomy that had been followed by nephrology at that time.  Had been using furosemide  1/2 pill daily. Managing swelling at this dose.  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 Last flare few weeks. Works well with 2000mg  x2 doses. Needs refill.   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  Declines covid vaccines.  MMR - had in past - told has to have repeat every few years? Bad batch in past. Request titer.  Declines pna vaccine.  Declines shingrix.    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   Abnormal craving 08/01/2022   Class 2 severe obesity with serious comorbidity and body mass index (BMI) of 36.0 to 36.9 in adult [facility]) 08/01/2022   Vitamin D  deficiency 02/22/2022   NAFLD (nonalcoholic fatty liver disease) 95/79/7976   Pre-diabetes 02/20/2022   H/O right nephrectomy 01/02/2022   Nausea without vomiting 01/02/2022   Graves disease 12/14/2021   Hyperthyroidism 12/14/2021   Multinodular goiter 12/14/2021   Arthritis 11/17/2021   Open fracture of left patella    Laceration of left knee with complication    Open wound of left knee    S/P left knee arthroscopy 05/27/2021   Laceration of left knee 05/26/2021   Mass of ureter s/p resection 05/17/2017 05/18/2017   Atrophic  kidney s/p right nepherctomy 05/17/2017 05/18/2017   Complication of anesthesia    Gastric tumor s/p partial gastrectomy 05/17/2016 05/17/2017   Solitary pulmonary nodule 02/18/2017   Genetic testing 04/04/2016   Family history of colon cancer 03/14/2016   Family history of breast cancer in female 03/14/2016   Class 1 obesity with serious comorbidity and body mass index (BMI) of 30.0 to 30.9 in adult 01/18/2012   Adjustment disorder with depressed mood 03/12/2011   Esophageal reflux 08/29/2010   Insomnia, unspecified 09/13/2009   Anxiety state 04/20/2008   Asthma 04/20/2008   LIVER FUNCTION TESTS, ABNORMAL 04/20/2008   Attention deficit hyperactivity disorder (ADHD) 06/10/2007   Past Medical History:  Diagnosis Date   Arthritis    Asthma    Complication of anesthesia    respirations slow down after coming out from Anesthesia   Endometriosis    GERD (gastroesophageal reflux disease)    H/O right nephrectomy    Mass of right kidney    Other fatigue    Palpitations    Post-operative nausea and vomiting    Shortness of breath on exertion    Past Surgical History:  Procedure Laterality Date   CERVICAL DISC SURGERY     CESAREAN SECTION     [id number]   CHOLECYSTECTOMY     COLONOSCOPY  12/17/2017   Dr.Stark   COLONOSCOPY  2022   MS-MAC-suprep(good)-TA/tics   EUS N/A 03/14/2017   Procedure: UPPER ENDOSCOPIC ULTRASOUND (EUS) LINEAR;  Surgeon: Teressa Toribio SQUIBB, MD;  Location: [facility];  Service: Endoscopy;  Laterality: N/A;   I & D EXTREMITY Left 05/26/2021   Procedure: IRRIGATION AND DEBRIDEMENT EXTREMITY WITH COMPLEX CLOSURE OF WOUND;  Surgeon: Addie Cordella Hamilton, MD;  Location: [facility];  Service: Orthopedics;  Laterality: Left;   KNEE ARTHROSCOPY Left 05/26/2021   Procedure: ARTHROSCOPIC KNEE WASHOUT;  Surgeon: Addie Cordella Hamilton, MD;  Location: [facility] OR;  Service: Orthopedics;  Laterality: Left;   LAPAROSCOPIC ABDOMINAL EXPLORATION N/A 05/17/2017   Procedure:  LAPAROSCOPIC POSSIBLE OPEN REMOVAL OF STOMACH MASS;  Surgeon: Lily Boas, MD;  Location: [facility];  Service: General;  Laterality: N/A;   LAPAROSCOPIC ASSISTED VAGINAL HYSTERECTOMY  2003   LAPAROSCOPIC NEPHRECTOMY Right 05/17/2017   Procedure: RIGHT RADICAL NEPHRECTOMY, URETERECTOMY;  Surgeon: Cam Morene ORN, MD;  Location: [facility];  Service: Urology;  Laterality: Right;   TOTAL ABDOMINAL HYSTERECTOMY     TUBAL LIGATION     Allergies  Allergen Reactions   Amoxicillin Anaphylaxis    All cillins pt was 65-37 years old  Has patient had a PCN reaction causing immediate rash, facial/tongue/throat swelling, SOB or lightheadedness with hypotension: yes Has  patient had a PCN reaction causing severe rash involving mucus membranes or skin necrosis: unknown Has patient had a PCN reaction that required hospitalization yes Has patient had a PCN reaction occurring within the last 10 years:no If all of the above answers are NO, then may proceed with Cephalosporin use.    Ibuprofen Anaphylaxis and Hives   Keflex [Cephalexin] Anaphylaxis   Penicillins Anaphylaxis    Has patient had a PCN reaction causing immediate rash, facial/tongue/throat swelling, SOB or lightheadedness with hypotension:yes Has patient had a PCN reaction causing severe rash involving mucus membranes or skin necrosis: unknown Has patient had a PCN reaction that required hospitalization yes  Has patient had a PCN reaction occurring within the last 10 years: no If all of the above answers are NO, then may proceed with Cephalosporin use.    Baby Powder [Methylbenzethonium] Hives   Doxycycline  Diarrhea and Rash   Prior to Admission medications   Medication Sig Start Date End Date Taking? Authorizing Provider  albuterol  (PROVENTIL  HFA) 108 (90 Base) MCG/ACT inhaler Inhale 2 puffs into the lungs every 6 (six) hours as needed for wheezing or shortness of breath. 11/12/23  Yes Levora Reyes SAUNDERS, MD  CEQUA 0.09 % SOLN Apply 1 drop  to eye 2 (two) times daily. 12/29/21  Yes [provider]  Cholecalciferol (VITAMIN D3) 25 MCG (1000 UT) CAPS Pt states she takes 2000IU every day-->  change to 2,000 IU every other day during the summer months 04/27/24  Yes Opalski, Barnie, DO  docusate (COLACE) 60 MG/15ML syrup Take 60 mg by mouth daily.   Yes [provider]  EPINEPHrine  (EPIPEN  2-PAK) 0.3 mg/0.3 mL IJ SOAJ injection Inject 0.3 mg into the muscle as needed for anaphylaxis. 09/04/23  Yes Levora Reyes SAUNDERS, MD  furosemide  (LASIX ) 20 MG tablet Take 0.5 tablets (10 mg total) by mouth daily. 05/12/24  Yes Levora Reyes SAUNDERS, MD  LATISSE 0.03 % ophthalmic solution Apply 1 drop via applicator once a day as directed 07/18/23  Yes [provider]  metFORMIN  (GLUCOPHAGE ) 500 MG tablet 1 po with lunch daily 04/27/24  Yes Opalski, Barnie, DO  methimazole  (TAPAZOLE ) 5 MG tablet Take 0.5 tablets (2.5 mg total) by mouth as directed. Half a tablet 4 days a week 02/14/24  Yes Shamleffer, Ibtehal Jaralla, MD  MOUNJARO  15 MG/0.5ML Pen SMARTSIG:1 pre-filled pen syringe SUB-Q Once a Week 07/10/22  Yes [provider]  Multiple Vitamin (MULTIVITAMIN PO) Take 1 tablet by mouth daily at 6 (six) AM.   Yes [provider]  pantoprazole  (PROTONIX ) 40 MG tablet Take 1 tablet (40 mg total) by mouth daily. 03/23/24  Yes Cirigliano, Vito V, DO  Probiotic Product (CULTURELLE PROBIOTICS PO) Take 1 tablet by mouth daily at 6 (six) AM.   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    Comment: Used to work as Lawyer at [employer]  Tobacco Use   Smoking  status: Never   Smokeless tobacco: Never  Vaping Use   Vaping status: Never Used  Substance and Sexual Activity   Alcohol use:  No   Drug use: No   Sexual activity: Yes    Birth control/protection: Surgical  Other Topics Concern   Not on file  Social History Narrative   Not on file   Social Drivers of Health   Financial Resource Strain: Not on file  Food Insecurity: No Food Insecurity (11/17/2021)   [facility]   Hunger Vital Sign    Within the past 12 months, you worried that your food would run out before you got the money to buy more.: Never true    Within the past 12 months, the food you bought just didn't last and you didn't have money to get more.: Never true  Transportation Needs: Not on file  Physical Activity: Not on file  Stress: Not on file  Social Connections: Unknown (03/18/2022)   Received from [facility]   Social Network    Social Network: Not on file  Intimate Partner Violence: Unknown (02/07/2022)   Received from [facility]   HITS    Physically Hurt: Not on file    Insult or Talk Down To: Not on file    Threaten Physical Harm: Not on file    Scream or Curse: Not on file    Review of Systems   Objective:   Vitals:   06/15/24 1343  BP: 106/72  Pulse: 93  Temp: 98.5 F (36.9 C)  TempSrc: Oral  SpO2: 95%  Weight: 132 lb 12.8 oz (60.2 kg)  Height: 5' (1.524 m)     Physical Exam Vitals reviewed.  Constitutional:      Appearance: Normal appearance. She is well-developed.  HENT:     Head: Normocephalic and atraumatic.  Eyes:     Conjunctiva/sclera: Conjunctivae normal.     Pupils: Pupils are equal, round, and reactive to light.  Neck:     Vascular: No carotid bruit.  Cardiovascular:     Rate and Rhythm: Normal rate and regular rhythm.     Heart sounds: Normal heart sounds.  Pulmonary:     Effort: Pulmonary effort is normal.     Breath sounds: Normal breath sounds.  Abdominal:     Palpations: Abdomen is soft. There is  no pulsatile mass.     Tenderness: There is no abdominal tenderness.  Musculoskeletal:     Right lower leg: No edema.     Left lower leg: No edema.  Skin:    General: Skin is warm and dry.  Neurological:     Mental Status: She is alert and oriented to person, place, and time.  Psychiatric:        Mood and Affect: Mood normal.        Behavior: Behavior normal.        Assessment & Plan:  [name] is a 53 y.o. female . Prediabetes  - Commended on efforts, followed by healthy weight and wellness, on Mounjaro , metformin  as above.  Labs noted recently, defer labs at this time to weight specialist.  No new meds.  Dependent edema - Plan: furosemide  (LASIX ) 20 MG tablet  - Stable with half dose of furosemide .  As weight improves may be able to space out dosing.  Refilled.  Recent electrolytes noted.  Other hyperlipidemia  - Low ASCVD risk score with recent labs as above, continue diet/exercise approach.  Immunity status testing - Plan: Measles/Mumps/Rubella Immunity  - As above she has been advised previously that may need MMR booster intermittently.  Will check antibody status as above, and if immune  would not recommend further vaccine at this time.   History of cold sores - Plan: valACYclovir  (VALTREX ) 1000 MG tablet  - Stable with intermittent dosing of Valtrex  2 g x 1 with repeat x 1 in 12 hours.  Refilled.  Gastroesophageal reflux disease, unspecified whether esophagitis present - Plan: pantoprazole  (PROTONIX ) 40 MG tablet  - Stable with current dose Protonix , plans to follow-up with GI, meds refilled.  - Infrequent need for Phenergan  as above, okay to refill when needed with RTC precautions if increased use  Meds ordered this encounter  Medications   furosemide  (LASIX ) 20 MG tablet    Sig: Take 0.5 tablets (10 mg total) by mouth daily.    Dispense:  45 tablet    Refill:  3   pantoprazole  (PROTONIX ) 40 MG tablet    Sig: Take 1 tablet (40 mg total) by mouth  daily.    Dispense:  90 tablet    Refill:  3   valACYclovir  (VALTREX ) 1000 MG tablet    Sig: Take 2 tablets (2,000 mg total) by mouth every 12 (twelve) hours for 2 doses.    Dispense:  20 tablet    Refill:  1   Patient Instructions  I will check the measles mumps and rubella antibody testing to see if you need a booster.  Other labs noted from April, will not repeat any at this time.  Continue furosemide  same dose, as weight improves you can try coming off that a day or 2/week to see if that is still needed.  No change in heartburn medication for now but follow-up with new gastroenterologist as we discussed.  Keep follow-up with other specialists as planned.   10-year heart disease risk score was low even with your elevated cholesterol reading, so I think that diet and exercise approach is reasonable for now.  Valtrex  refilled if needed for cold sores.  Follow-up with me in a year for physical but happy to see you sooner if any new concerns.  Take care!    Signed,   Reyes Pines, MD  Primary Care, [facility] 06/15/24 2:32 PM

## 2024-06-15 NOTE — Patient Instructions (Addendum)
 I will check the measles mumps and rubella antibody testing to see if you need a booster.  Other labs noted from April, will not repeat any at this time.  Continue furosemide  same dose, as weight improves you can try coming off that a day or 2/week to see if that is still needed.  No change in heartburn medication for now but follow-up with new gastroenterologist as we discussed.  Keep follow-up with other specialists as planned.   10-year heart disease risk score was low even with your elevated cholesterol reading, so I think that diet and exercise approach is reasonable for now.  Valtrex  refilled if needed for cold sores.  Follow-up with me in a year for physical but happy to see you sooner if any new concerns.  Take care!

## 2024-06-16 ENCOUNTER — Encounter: Payer: Self-pay | Admitting: Family Medicine

## 2024-06-16 ENCOUNTER — Ambulatory Visit: Payer: Self-pay | Admitting: Family Medicine

## 2024-06-16 LAB — MEASLES/MUMPS/RUBELLA IMMUNITY
Mumps IgG: 42.4 [AU]/ml
Rubella: 1.26 {index}
Rubeola IgG: 24.6 [AU]/ml

## 2024-06-17 NOTE — Progress Notes (Signed)
 Pt has been notified.

## 2024-06-22 ENCOUNTER — Ambulatory Visit (INDEPENDENT_AMBULATORY_CARE_PROVIDER_SITE_OTHER): Admitting: Family Medicine

## 2024-06-22 ENCOUNTER — Encounter (INDEPENDENT_AMBULATORY_CARE_PROVIDER_SITE_OTHER): Payer: Self-pay | Admitting: Family Medicine

## 2024-06-22 VITALS — BP 104/72 | HR 89 | Temp 98.1°F | Ht 61.0 in | Wt 129.0 lb

## 2024-06-22 DIAGNOSIS — E559 Vitamin D deficiency, unspecified: Secondary | ICD-10-CM | POA: Diagnosis not present

## 2024-06-22 DIAGNOSIS — R7303 Prediabetes: Secondary | ICD-10-CM | POA: Diagnosis not present

## 2024-06-22 DIAGNOSIS — Z6824 Body mass index (BMI) 24.0-24.9, adult: Secondary | ICD-10-CM

## 2024-06-22 MED ORDER — METFORMIN HCL 500 MG PO TABS: ORAL_TABLET | ORAL | 0 refills | Status: DC

## 2024-06-22 MED ORDER — MOUNJARO 15 MG/0.5ML ~~LOC~~ SOAJ: 15.0000 mg | SUBCUTANEOUS | 2 refills | Status: DC

## 2024-06-22 NOTE — Progress Notes (Signed)
 [name], D.O.  ABFM, ABOM Specializing in Clinical Bariatric Medicine  Office located at: [address] [place], [place]  [address]   Assessment and Plan:   Medications Discontinued During This Encounter  Medication Reason   MOUNJARO  15 MG/0.5ML Pen Reorder   metFORMIN  (GLUCOPHAGE ) 500 MG tablet Reorder     Meds ordered this encounter  Medications   MOUNJARO  15 MG/0.5ML Pen    Sig: Inject 15 mg into the skin once a week.    Dispense:  2 mL    Refill:  2   metFORMIN  (GLUCOPHAGE ) 500 MG tablet    Sig: 1 po with lunch daily    Dispense:  90 tablet    Refill:  0    Do not send RF request     FOR THE DISEASE OF OBESITY:  BMI 24.0-24.9, adult-current 24.39 Morbid obesity (HCC)-start bmi 35.90/date 11/29/21 Assessment & Plan: Since last office visit on 04/27/2024 patient's muscle mass has decreased by 2.2 lbs. Fat mass has increased by 0.6 lbs. Total body water  has increased by 0.2 lbs.  Body fat % has increased by 0.2  %. Counseling done on how various foods will affect these numbers and how to maximize success  Total lbs lost to date: - 61 lbs Total weight loss percentage to date: -32.11 %   Recommended Dietary Goals [name] is currently in the action stage of change. As such, her goal is to continue weight management plan.  She has agreed to: continue current plan   Behavioral Intervention We discussed the following today: increasing lean protein intake to established goals, decreasing simple carbohydrates , and work on tracking and journaling calories using tracking application  Additional resources provided today: none  Evidence-based interventions for health behavior change were utilized today including the discussion of self monitoring techniques, problem-solving barriers and SMART goal setting techniques.   Regarding patient's less desirable eating habits and patterns, we employed the technique of small changes.   Goal: journal for a few days to  increase mindfulness.    Recommended Physical Activity Goals [name] has been advised to work up to 300-450 minutes of moderate intensity aerobic activity a week and strengthening exercises 2-3 times per week for cardiovascular health, weight loss maintenance and preservation of muscle mass.   She has agreed to : continue walking regimen and add more weight training.    Pharmacotherapy Continue same regimen.   ASSOCIATED CONDITIONS ADDRESSED TODAY:   Prediabetes Assessment & Plan: Lab Results  Component Value Date   HGBA1C 5.4 02/27/2024   HGBA1C 5.6 09/23/2023   HGBA1C 5.6 04/25/2023   INSULIN  5.8 09/23/2023   INSULIN  11.9 04/25/2023   INSULIN  6.7 12/03/2022    Pre-DM managed with Metformin  500 mg daily and Mounjaro  15 mg weekly; no side effects reported. Good control of hunger and cravings. Continue current medicines, exercise regimen and balanced diet focusing on protein, fruits, and vegetables while limiting simple carbohydrates.     Vitamin D  deficiency Assessment & Plan: Lab Results  Component Value Date   VD25OH 57.9 02/27/2024   VD25OH 49.1 09/23/2023   VD25OH 51.9 04/25/2023   Pt is doing well on OTC Vit D 2,000 international units every other day. No acute concerns. Continue same regimen. Recheck as deemed medically necessary.    Follow up:   Return 09/14/2024 10:00 AM.  She was informed of the importance of frequent follow up visits to maximize her success with intensive lifestyle modifications for her multiple health conditions.  Subjective:   Chief complaint: Obesity [name] is here to discuss her progress with her obesity treatment plan. She is  keeping a food journal and adhering to recommended goals of [phone] calories and 80+ g of protein and states she is following her eating plan approximately 95% of the time. She states she is walking 3.5 hours per day and doing weights 30 minutes 4 days a week.    Interval History:  [name] is  here for a follow up office visit. Since last OV on 04/27/2024 , she is down 2 lbs. She is mentally tracking all her foods. States she is getting in her lean proteins, not skipping meals, and is drinking adequate amounts of water . She has good control of hunger and cravings.    Pharmacotherapy that aid with weight loss: She is currently taking Metformin  500 mg daily and Mounjaro  15 mg weekly.   Review of Systems:  Pertinent positives were addressed with patient today.  Reviewed by clinician on day of visit: allergies, medications, problem list, medical history, surgical history, family history, social history, and previous encounter notes.  Weight Summary and Biometrics   Weight Lost Since Last Visit: 2lb  Weight Gained Since Last Visit: 0   Vitals Temp: 98.1 F (36.7 C) BP: 104/72 Pulse Rate: 89 SpO2: 98 %   Anthropometric Measurements Height: 5' 1 (1.549 m) Weight: 129 lb (58.5 kg) BMI (Calculated): 24.39 Weight at Last Visit: 131lb Weight Lost Since Last Visit: 2lb Weight Gained Since Last Visit: 0 Starting Weight: 190lb Total Weight Loss (lbs): 61 lb (27.7 kg)   Body Composition  Body Fat %: 33.3 % Fat Mass (lbs): 43.2 lbs Muscle Mass (lbs): 82 lbs Total Body Water  (lbs): 58.8 lbs Visceral Fat Rating : 7   Other Clinical Data Fasting: no Labs: no Today's Visit #: 32 Starting Date: 11/29/21    Objective:   PHYSICAL EXAM: Blood pressure 104/72, pulse 89, temperature 98.1 F (36.7 C), height 5' 1 (1.549 m), weight 129 lb (58.5 kg), SpO2 98%. Body mass index is 24.37 kg/m.  General: she is overweight, cooperative and in no acute distress. PSYCH: Has normal mood, affect and thought process.   HEENT: EOMI, sclerae are anicteric. Lungs: Normal breathing effort, no conversational dyspnea. Extremities: Moves * 4 Neurologic: A and O * 3, good insight  DIAGNOSTIC DATA REVIEWED: BMET    Component Value Date/Time   NA 142 02/27/2024 0750   K 4.6  02/27/2024 0750   CL 102 02/27/2024 0750   CO2 25 02/27/2024 0750   GLUCOSE 91 02/27/2024 0750   GLUCOSE 83 01/02/2022 1216   BUN 12 02/27/2024 0750   CREATININE 0.83 02/27/2024 0750   CALCIUM 10.0 02/27/2024 0750   GFRNONAA >60 11/05/2019 0115   GFRAA >60 11/05/2019 0115   Lab Results  Component Value Date   HGBA1C 5.4 02/27/2024   HGBA1C 6.3 09/26/2021   Lab Results  Component Value Date   INSULIN  5.8 09/23/2023   INSULIN  7.7 05/24/2022   Lab Results  Component Value Date   TSH 1.10 02/13/2024   CBC    Component Value Date/Time   WBC 6.9 11/16/2021 0821   RBC 5.31 (H) 11/16/2021 0821   HGB 13.5 11/16/2021 0821   HCT 43.2 11/16/2021 0821   PLT 327.0 11/16/2021 0821   MCV 81.4 11/16/2021 0821   MCH 26.6 11/05/2019 0115   MCHC 31.3 11/16/2021 0821   RDW 15.3 11/16/2021 0821   Iron Studies No results found for: IRON, TIBC,  FERRITIN, IRONPCTSAT Lipid Panel     Component Value Date/Time   CHOL 228 (H) 02/27/2024 0750   TRIG 106 02/27/2024 0750   HDL 58 02/27/2024 0750   CHOLHDL 3.9 02/27/2024 0750   CHOLHDL 4 09/26/2021 0759   VLDL 27.0 09/26/2021 0759   LDLCALC 151 (H) 02/27/2024 0750   Hepatic Function Panel     Component Value Date/Time   PROT 6.3 02/27/2024 0750   ALBUMIN 4.6 02/27/2024 0750   AST 21 02/27/2024 0750   ALT 21 02/27/2024 0750   ALKPHOS 94 02/27/2024 0750   BILITOT 0.3 02/27/2024 0750   BILIDIR 0.1 04/20/2008 1145      Component Value Date/Time   TSH 1.10 02/13/2024 1021   Nutritional Lab Results  Component Value Date   VD25OH 57.9 02/27/2024   VD25OH 49.1 09/23/2023   VD25OH 51.9 04/25/2023    Attestations:   I, Special Puri, acting as a Stage manager for [employer], DO., have compiled all relevant documentation for today's office visit on behalf of Whitney Jenkins, DO, while in the presence of [employer], DO.  I have reviewed the above documentation for accuracy and completeness, and I agree with the  above. Whitney JINNY [name], D.O.  The 21st Century Cures Act was signed into law in 2016 which includes the topic of electronic health records.  This provides immediate access to information in MyChart.  This includes consultation notes, operative notes, office notes, lab results and pathology reports.  If you have any questions about what you read please let us  know at your next visit so we can discuss your concerns and take corrective action if need be.  We are right here with you.

## 2024-06-29 DIAGNOSIS — H02422 Myogenic ptosis of left eyelid: Secondary | ICD-10-CM | POA: Diagnosis not present

## 2024-06-29 DIAGNOSIS — Z01818 Encounter for other preprocedural examination: Secondary | ICD-10-CM | POA: Diagnosis not present

## 2024-06-29 DIAGNOSIS — H02834 Dermatochalasis of left upper eyelid: Secondary | ICD-10-CM | POA: Diagnosis not present

## 2024-06-29 DIAGNOSIS — Z09 Encounter for follow-up examination after completed treatment for conditions other than malignant neoplasm: Secondary | ICD-10-CM | POA: Diagnosis not present

## 2024-06-29 DIAGNOSIS — H02423 Myogenic ptosis of bilateral eyelids: Secondary | ICD-10-CM | POA: Diagnosis not present

## 2024-06-29 DIAGNOSIS — H02831 Dermatochalasis of right upper eyelid: Secondary | ICD-10-CM | POA: Diagnosis not present

## 2024-06-29 DIAGNOSIS — E0500 Thyrotoxicosis with diffuse goiter without thyrotoxic crisis or storm: Secondary | ICD-10-CM | POA: Diagnosis not present

## 2024-06-29 DIAGNOSIS — H02421 Myogenic ptosis of right eyelid: Secondary | ICD-10-CM | POA: Diagnosis not present

## 2024-06-29 DIAGNOSIS — H57813 Brow ptosis, bilateral: Secondary | ICD-10-CM | POA: Diagnosis not present

## 2024-06-29 DIAGNOSIS — H05241 Constant exophthalmos, right eye: Secondary | ICD-10-CM | POA: Diagnosis not present

## 2024-06-29 DIAGNOSIS — H53483 Generalized contraction of visual field, bilateral: Secondary | ICD-10-CM | POA: Diagnosis not present

## 2024-06-29 DIAGNOSIS — H0279 Other degenerative disorders of eyelid and periocular area: Secondary | ICD-10-CM | POA: Diagnosis not present

## 2024-07-21 ENCOUNTER — Telehealth: Payer: Self-pay

## 2024-07-21 NOTE — Telephone Encounter (Signed)
 Pt notes pain Rt ear pain/pressure for 2 days asked that you just send in ear drops, advised would need an appt. Made one for 07/22/2024 afternoon but patient requested I ask and then cancel appt if not needed.  Please advise if you will send drops or if pt needs to keep appt?

## 2024-07-21 NOTE — Telephone Encounter (Signed)
Yes, needs appointment. Thanks

## 2024-07-21 NOTE — Telephone Encounter (Signed)
 Pt has been informed.

## 2024-07-22 ENCOUNTER — Encounter: Payer: Self-pay | Admitting: Family Medicine

## 2024-07-22 ENCOUNTER — Ambulatory Visit: Admitting: Family Medicine

## 2024-07-22 VITALS — BP 106/64 | HR 100 | Temp 98.5°F | Resp 18 | Ht 61.0 in | Wt 134.6 lb

## 2024-07-22 DIAGNOSIS — R051 Acute cough: Secondary | ICD-10-CM | POA: Diagnosis not present

## 2024-07-22 DIAGNOSIS — R0789 Other chest pain: Secondary | ICD-10-CM

## 2024-07-22 DIAGNOSIS — H60501 Unspecified acute noninfective otitis externa, right ear: Secondary | ICD-10-CM | POA: Diagnosis not present

## 2024-07-22 DIAGNOSIS — H9201 Otalgia, right ear: Secondary | ICD-10-CM

## 2024-07-22 DIAGNOSIS — J029 Acute pharyngitis, unspecified: Secondary | ICD-10-CM

## 2024-07-22 LAB — POCT RAPID STREP A (OFFICE): Rapid Strep A Screen: NEGATIVE

## 2024-07-22 LAB — POC INFLUENZA A&B (BINAX/QUICKVUE)
Influenza A, POC: NEGATIVE
Influenza B, POC: NEGATIVE

## 2024-07-22 LAB — POC COVID19 BINAXNOW: SARS Coronavirus 2 Ag: NEGATIVE

## 2024-07-22 MED ORDER — NEOMYCIN-POLYMYXIN-HC 3.5-10000-1 OT SUSP: 3.0000 [drp] | Freq: Three times a day (TID) | OTIC | 0 refills | Status: AC

## 2024-07-22 NOTE — Patient Instructions (Addendum)
 Strep COVID and flu test were negative/normal today, but I suspect you may have another virus with some of your other symptoms.  I do see some redness within the canal on the right side and a possible small abrasion.  Otherwise the canal does not look swollen, and I do not see any pus within the canal.  However this can be an early sign of otitis externa or outer ear infection.  I did not see any obvious middle ear infection.  Sometimes nasal congestion and pressure can also cause some referred pain to the ear.  Sore throat can also cause referred pain to the ear.  You can try saline nasal spray for nasal congestion to see if that helps.  Flonase over-the-counter is another option if needed.  Please be seen if any worsening symptoms, make sure to drink plenty fluids and rest.  Hang in there!

## 2024-07-22 NOTE — Progress Notes (Signed)
 Subjective:  Patient ID: [name], female    DOB: [birth year]  Age: 53 y.o. MRN: [id number]  CC:  Chief Complaint  Patient presents with   Ear Pain    ST, diarrhea, right ear and sore chest. Cough. Put some ear drops in her ear that were her granddaughters. Sx started 4 days ago.     HPI [name] presents for   Ear pain: Sore throat, cough, R ear pain. Started 4 days ago. Initial R ear pain - ache. Pain with swallowing. No discharge.  Sore throat past 2 days with some runny nose. Cough past few days.  Diarrhea starting 2 days ago and chest has been sore.  No home testing.  No known sick contacts. Travel to [place]  2 weeks ago.  No fever.  Drinking fluids - hurts to swallow.  Allergic to ibuprofen, advised to nat take tylenol  by her specialists.  Hx of NAFLD - avoids tylenol , alcohol.   Tx: used granddaughters ear drops - once last night. Unknown med.        History Patient Active Problem List   Diagnosis Date Noted   Other hyperlipidemia 01/08/2023   HLD- inc LDL 01/08/2023   Morbid obesity (HCC)-start bmi 35.90/date 11/29/21 01/08/2023   BMI 24.0-24.9, adult-current 24.1 01/08/2023   SOB (shortness of breath) on exertion 12/03/2022   Viral Gastroenteritis 12/03/2022   Polyphagia 11/08/2022   Bronchitis 10/17/2022   History of endometriosis 08/03/2022   Lower abdominal pain 08/03/2022   Pelvic adhesive disease 08/03/2022   Abnormal craving 08/01/2022   Class 2 severe obesity with serious comorbidity and body mass index (BMI) of 36.0 to 36.9 in adult (HCC) 08/01/2022   Vitamin D  deficiency 02/22/2022   NAFLD (nonalcoholic fatty liver disease) 95/79/7976   Pre-diabetes 02/20/2022   H/O right nephrectomy 01/02/2022   Nausea without vomiting 01/02/2022   Graves disease 12/14/2021   Hyperthyroidism 12/14/2021   Multinodular goiter 12/14/2021   Arthritis 11/17/2021   Open fracture of left patella    Laceration of left knee with  complication    Open wound of left knee    S/P left knee arthroscopy 05/27/2021   Laceration of left knee 05/26/2021   Vertigo 04/28/2020   Mass of ureter s/p resection 05/17/2017 05/18/2017   Atrophic kidney s/p right nepherctomy 05/17/2017 05/18/2017   Complication of anesthesia    Gastric tumor s/p partial gastrectomy 05/17/2016 05/17/2017   Solitary pulmonary nodule 02/18/2017   Genetic testing 04/04/2016   Family history of colon cancer 03/14/2016   Family history of breast cancer in female 03/14/2016   Class 1 obesity with serious comorbidity and body mass index (BMI) of 30.0 to 30.9 in adult 01/18/2012   Adjustment disorder with depressed mood 03/12/2011   Esophageal reflux 08/29/2010   Insomnia, unspecified 09/13/2009   Anxiety state 04/20/2008   Asthma 04/20/2008   LIVER FUNCTION TESTS, ABNORMAL 04/20/2008   Attention deficit hyperactivity disorder (ADHD) 06/10/2007   Past Medical History:  Diagnosis Date   Arthritis    Asthma    Complication of anesthesia    respirations slow down after coming out from Anesthesia   Endometriosis    GERD (gastroesophageal reflux disease)    H/O right nephrectomy    Mass of right kidney    Other fatigue    Palpitations    Post-operative nausea and vomiting    Shortness of breath on exertion    Past Surgical History:  Procedure Laterality Date   CERVICAL DISC SURGERY  CESAREAN SECTION     x3 [phone])   CHOLECYSTECTOMY     COLONOSCOPY  12/17/2017   Dr.Stark   COLONOSCOPY  2022   MS-MAC-suprep(good)-TA/tics   EUS N/A 03/14/2017   Procedure: UPPER ENDOSCOPIC ULTRASOUND (EUS) LINEAR;  Surgeon: Teressa Toribio SQUIBB, MD;  Location: [facility];  Service: Endoscopy;  Laterality: N/A;   I & D EXTREMITY Left 05/26/2021   Procedure: IRRIGATION AND DEBRIDEMENT EXTREMITY WITH COMPLEX CLOSURE OF WOUND;  Surgeon: Addie Cordella Hamilton, MD;  Location: [facility] OR;  Service: Orthopedics;  Laterality: Left;   KNEE ARTHROSCOPY Left 05/26/2021    Procedure: ARTHROSCOPIC KNEE WASHOUT;  Surgeon: Addie Cordella Hamilton, MD;  Location: [facility] OR;  Service: Orthopedics;  Laterality: Left;   LAPAROSCOPIC ABDOMINAL EXPLORATION N/A 05/17/2017   Procedure: LAPAROSCOPIC POSSIBLE OPEN REMOVAL OF STOMACH MASS;  Surgeon: Lily Boas, MD;  Location: [facility] ORS;  Service: General;  Laterality: N/A;   LAPAROSCOPIC ASSISTED VAGINAL HYSTERECTOMY  2003   LAPAROSCOPIC NEPHRECTOMY Right 05/17/2017   Procedure: RIGHT RADICAL NEPHRECTOMY, URETERECTOMY;  Surgeon: Cam Morene ORN, MD;  Location: [facility] ORS;  Service: Urology;  Laterality: Right;   TOTAL ABDOMINAL HYSTERECTOMY     TUBAL LIGATION     Allergies  Allergen Reactions   Amoxicillin Anaphylaxis    All cillins pt was 52-67 years old  Has patient had a PCN reaction causing immediate rash, facial/tongue/throat swelling, SOB or lightheadedness with hypotension: yes Has patient had a PCN reaction causing severe rash involving mucus membranes or skin necrosis: unknown Has patient had a PCN reaction that required hospitalization yes Has patient had a PCN reaction occurring within the last 10 years:no If all of the above answers are NO, then may proceed with Cephalosporin use.    Ibuprofen Anaphylaxis and Hives   Keflex [Cephalexin] Anaphylaxis   Penicillins Anaphylaxis    Has patient had a PCN reaction causing immediate rash, facial/tongue/throat swelling, SOB or lightheadedness with hypotension:yes Has patient had a PCN reaction causing severe rash involving mucus membranes or skin necrosis: unknown Has patient had a PCN reaction that required hospitalization yes  Has patient had a PCN reaction occurring within the last 10 years: no If all of the above answers are NO, then may proceed with Cephalosporin use.    Baby Powder [Methylbenzethonium] Hives   Doxycycline  Diarrhea and Rash   Prior to Admission medications   Medication Sig Start Date End Date Taking? Authorizing Provider  albuterol  (PROVENTIL   HFA) 108 (90 Base) MCG/ACT inhaler Inhale 2 puffs into the lungs every 6 (six) hours as needed for wheezing or shortness of breath. 11/12/23   Levora Reyes SAUNDERS, MD  CEQUA 0.09 % SOLN Apply 1 drop to eye 2 (two) times daily. 12/29/21   [provider]  Cholecalciferol (VITAMIN D3) 25 MCG (1000 UT) CAPS Pt states she takes 2000IU every day-->  change to 2,000 IU every other day during the summer months 04/27/24   Opalski, Barnie, DO  docusate (COLACE) 60 MG/15ML syrup Take 60 mg by mouth daily.    [provider]  EPINEPHrine  (EPIPEN  2-PAK) 0.3 mg/0.3 mL IJ SOAJ injection Inject 0.3 mg into the muscle as needed for anaphylaxis. 09/04/23   Levora Reyes SAUNDERS, MD  furosemide  (LASIX ) 20 MG tablet Take 0.5 tablets (10 mg total) by mouth daily. 06/15/24   Charlotte Brafford R, MD  LATISSE 0.03 % ophthalmic solution Apply 1 drop via applicator once a day as directed 07/18/23   [provider]  metFORMIN  (GLUCOPHAGE ) 500 MG tablet 1 po  with lunch daily 06/22/24   Midge Sober, DO  methimazole  (TAPAZOLE ) 5 MG tablet Take 0.5 tablets (2.5 mg total) by mouth as directed. Half a tablet 4 days a week 02/14/24   Shamleffer, Donell Cardinal, MD  MOUNJARO  15 MG/0.5ML Pen Inject 15 mg into the skin once a week. 06/22/24   Midge Sober, DO  Multiple Vitamin (MULTIVITAMIN PO) Take 1 tablet by mouth daily at 6 (six) AM.    [provider]  pantoprazole  (PROTONIX ) 40 MG tablet Take 1 tablet (40 mg total) by mouth daily. 06/15/24   Levora Reyes SAUNDERS, MD  Probiotic Product (CULTURELLE PROBIOTICS PO) Take 1 tablet by mouth daily at 6 (six) AM.    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    Comment: Used to work as Lawyer at [employer]  Tobacco Use   Smoking status: Never   Smokeless tobacco: Never  Vaping Use   Vaping status: Never Used  Substance and Sexual Activity   Alcohol use: No   Drug use: No   Sexual activity: Yes    Birth control/protection: Surgical  Other Topics Concern   Not on file  Social History Narrative   Not on file   Social Drivers of Health   Financial Resource Strain: Not on file  Food Insecurity: No Food Insecurity (11/17/2021)   Received from [facility]   Hunger Vital Sign    Within the past 12 months, you worried that your food would run out before you got the money to buy more.: Never true    Within the past 12 months, the food you bought just didn't last and you didn't have money to get more.: Never true  Transportation Needs: Not on file  Physical Activity: Not on file  Stress: Not on file  Social Connections: Unknown (03/18/2022)   [facility]   Social Network    Social Network: Not on file  Intimate Partner Violence: Unknown (02/07/2022)   Received from [facility]   HITS    Physically Hurt: Not on file    Insult or Talk Down To: Not on file    Threaten Physical Harm: Not on file    Scream or Curse: Not on file    Review of Systems Per HPI  Objective:   Vitals:   07/22/24 1305  BP: 106/64  Pulse: 100  Resp: 18  Temp: 98.5 F (36.9 C)  TempSrc: Temporal  SpO2: 99%  Weight: 134 lb 9.6 oz (61.1 kg)  Height: 5' 1 (1.549 m)     Physical Exam Vitals reviewed.  Constitutional:      General: She is not in acute distress.    Appearance: She is well-developed.  HENT:     Head: Normocephalic and atraumatic.     Right Ear: Hearing and external ear normal.     Left Ear: Hearing, tympanic membrane, ear canal and external ear normal.     Ears:     Comments: Right ear, discomfort with pinna traction, no rash externally, no external ear  swelling.  Canal with slight  erythema, 1 small abraded area with dried blood.  No active bleeding.  No significant canal edema.  No discharge.  TM with small amount of clear fluid at base but no erythema or apparent effusion otherwise.  Not injected.  Left ear, pain-free, canal clear, TM pearly gray.    Nose: Nose normal.     Mouth/Throat:     Mouth: Mucous membranes are moist.     Pharynx: Oropharynx is clear. No oropharyngeal exudate or posterior oropharyngeal erythema.  Eyes:     Conjunctiva/sclera: Conjunctivae normal.     Pupils: Pupils are equal, round, and reactive to light.  Cardiovascular:     Rate and Rhythm: Normal rate and regular rhythm.     Heart sounds: Normal heart sounds. No murmur heard. Pulmonary:     Effort: Pulmonary effort is normal. No respiratory distress.     Breath sounds: Normal breath sounds. No wheezing or rhonchi.  Lymphadenopathy:     Cervical: No cervical adenopathy.  Skin:    General: Skin is warm and dry.     Findings: No rash.  Neurological:     Mental Status: She is alert and oriented to person, place, and time.  Psychiatric:        Mood and Affect: Mood normal.        Behavior: Behavior normal.     Results for orders placed or performed in visit on 07/22/24  POCT rapid strep A   Collection Time: 07/22/24  1:36 PM  Result Value Ref Range   Rapid Strep A Screen Negative Negative  POC COVID-19 BinaxNow   Collection Time: 07/22/24  1:36 PM  Result Value Ref Range   SARS Coronavirus 2 Ag Negative Negative  POC Influenza A&B(BINAX/QUICKVUE)   Collection Time: 07/22/24  1:37 PM  Result Value Ref Range   Influenza A, POC Negative Negative   Influenza B, POC Negative Negative      Assessment & Plan:  [name] is a 53 y.o. female . Sore throat - Plan: POCT rapid strep A  Right ear pain - Plan: POC COVID-19 BinaxNow, POC Influenza A&B(BINAX/QUICKVUE), neomycin -polymyxin-hydrocortisone  (CORTISPORIN) 3.5-10000-1 OTIC  suspension  Acute otitis externa of right ear, unspecified type - Plan: POC COVID-19 BinaxNow, POC Influenza A&B(BINAX/QUICKVUE), neomycin -polymyxin-hydrocortisone  (CORTISPORIN) 3.5-10000-1 OTIC suspension  Acute cough - Plan: POC COVID-19 BinaxNow, POC Influenza A&B(BINAX/QUICKVUE)  Chest discomfort - Plan: POC COVID-19 BinaxNow, POC Influenza A&B(BINAX/QUICKVUE)  Suspected viral syndrome based on symptoms above, negative strep, COVID, and flu testing in office.  Erythema of right ear canal, possible small abrasion with secondary early otitis externa possible.  Some discomfort could be related to serous otitis with congestion as well.  Symptomatic care discussed, start Cortisporin otic suspension, RTC precautions given.   Meds ordered this encounter  Medications   neomycin -polymyxin-hydrocortisone  (CORTISPORIN) 3.5-10000-1 OTIC suspension    Sig: Place 3-4 drops into the right ear 3 (three) times daily. For 1 week.    Dispense:  10 mL    Refill:  0   Patient Instructions  Strep COVID and flu test were negative/normal today, but I suspect you may have another virus with some of your other symptoms.  I do see some redness within the canal on the right side and a possible small abrasion.  Otherwise the canal does not look swollen, and I do not see any pus within the canal.  However this can be an early sign of otitis externa or outer ear infection.  I did not see any obvious middle ear  infection.  Sometimes nasal congestion and pressure can also cause some referred pain to the ear.  Sore throat can also cause referred pain to the ear.  You can try saline nasal spray for nasal congestion to see if that helps.  Flonase over-the-counter is another option if needed.  Please be seen if any worsening symptoms, make sure to drink plenty fluids and rest.  Hang in there!      Signed,   Reyes Pines, MD [facility], [facility] 07/22/24 1:55 PM

## 2024-08-12 ENCOUNTER — Other Ambulatory Visit: Payer: Self-pay | Admitting: Internal Medicine

## 2024-08-14 ENCOUNTER — Encounter: Payer: Self-pay | Admitting: Internal Medicine

## 2024-08-14 ENCOUNTER — Other Ambulatory Visit

## 2024-08-14 ENCOUNTER — Ambulatory Visit: Admitting: Internal Medicine

## 2024-08-14 VITALS — BP 110/74 | HR 75 | Ht 61.0 in | Wt 134.0 lb

## 2024-08-14 DIAGNOSIS — E0590 Thyrotoxicosis, unspecified without thyrotoxic crisis or storm: Secondary | ICD-10-CM | POA: Diagnosis not present

## 2024-08-14 DIAGNOSIS — E0500 Thyrotoxicosis with diffuse goiter without thyrotoxic crisis or storm: Secondary | ICD-10-CM

## 2024-08-14 LAB — T3, FREE: T3, Free: 3.7 pg/mL (ref 2.3–4.2)

## 2024-08-14 LAB — T4, FREE: Free T4: 1.4 ng/dL (ref 0.8–1.8)

## 2024-08-14 LAB — TSH: TSH: 1.02 m[IU]/L

## 2024-08-14 NOTE — Progress Notes (Signed)
 Name: [name]  MRN/ DOB: [id number], 23-Oct-1971    Age/ Sex: 53 y.o., female     PCP: Whitney Reyes SAUNDERS, MD   Reason for Endocrinology Evaluation: Hyperthyroidism     Initial Endocrinology Clinic Visit: 10/19/2021    PATIENT IDENTIFIER: [name] is a 53 y.o., female with a past medical history of hyperthyroidism, Asthma and Hx of right nephrectomy for benign reasons. She has followed with [facility] since 10/19/2021 for consultative assistance with management of her hyperthyroidism.   HISTORICAL SUMMARY:  She has been noted with suppressed TSh at 0.00 uIU/mL in 09/2021 ( no concommitent FT4at the time) Repeat testing confirmed a TSH 0.01 uIU/mL with normal FT4 at 0.8 ng/dL and normal FT3 at 4.2 pg/mL    She was also found to have slight elevation of Anti-TPO Abs at 16 IU/mL  TRAB slightly elevated at 2.66 IU/L   She was started on methimazole  10/2021   Denies radiation exposure   Thyroid  ultrasound 10/20/2021 showed multinodular goiter   Mother and sister with thyroid  disease    SUBJECTIVE:    Today (08/14/2024):  [name] is here for hyperthyroidism.   She continues to follow-up with healthy weight and wellness Weight stable  No local neck swelling  No palpitations  Has rare constipation  No tremors  Scheduled for TED sx 08/26/2023  Methimazole  5 mg, half a tablet four days a week  ( Monday, Wednesday, Friday and Sunday )    HISTORY:  Past Medical History:  Past Medical History:  Diagnosis Date   Arthritis    Asthma    Complication of anesthesia    respirations slow down after coming out from Anesthesia   Endometriosis    GERD (gastroesophageal reflux disease)    H/O right nephrectomy    Mass of right kidney    Other fatigue    Palpitations    Post-operative nausea and vomiting    Shortness of breath on exertion    Past Surgical History:  Past Surgical History:  Procedure Laterality Date    CERVICAL DISC SURGERY     CESAREAN SECTION     x3 [phone])   CHOLECYSTECTOMY     COLONOSCOPY  12/17/2017   Dr.Stark   COLONOSCOPY  2022   MS-MAC-suprep(good)-TA/tics   EUS N/A 03/14/2017   Procedure: UPPER ENDOSCOPIC ULTRASOUND (EUS) LINEAR;  Surgeon: Teressa Toribio SQUIBB, MD;  Location: [facility];  Service: Endoscopy;  Laterality: N/A;   I & D EXTREMITY Left 05/26/2021   Procedure: IRRIGATION AND DEBRIDEMENT EXTREMITY WITH COMPLEX CLOSURE OF WOUND;  Surgeon: Addie Cordella Hamilton, MD;  Location: [facility];  Service: Orthopedics;  Laterality: Left;   KNEE ARTHROSCOPY Left 05/26/2021   Procedure: ARTHROSCOPIC KNEE WASHOUT;  Surgeon: Addie Cordella Hamilton, MD;  Location: [facility] OR;  Service: Orthopedics;  Laterality: Left;   LAPAROSCOPIC ABDOMINAL EXPLORATION N/A 05/17/2017   Procedure: LAPAROSCOPIC POSSIBLE OPEN REMOVAL OF STOMACH MASS;  Surgeon: Lily Boas, MD;  Location: [facility]S;  Service: General;  Laterality: N/A;   LAPAROSCOPIC ASSISTED VAGINAL HYSTERECTOMY  2003   LAPAROSCOPIC NEPHRECTOMY Right 05/17/2017   Procedure: RIGHT RADICAL NEPHRECTOMY, URETERECTOMY;  Surgeon: Cam Morene ORN, MD;  Location: [facility];  Service: Urology;  Laterality: Right;   TOTAL ABDOMINAL HYSTERECTOMY     TUBAL LIGATION     Social History:  reports that she has never smoked. She has never used smokeless tobacco. She reports that she does not drink alcohol and does not use drugs. Family History:  Family History  Problem Relation Age of Onset   Alcoholism Mother    Thyroid  disease Mother    Stroke Mother    Hypertension Mother    Cancer Mother 29       back cancer   Other Mother        hysterectomy at 38 for pre-cancerous cervical cells   Colon polyps Father 56   Obesity Father    Cancer Father    Hypertension Father    Colon cancer Father 36   Cervical cancer Sister        full sister dx. unspecified age   Colon polyps Sister        unspecified number   Colon polyps Sister         paternal half-sister w/ colon polyps dx. age 78 or younger - unspecified number   Colon cancer Sister 7       paternal half-sister    Colon polyps Sister        paternal half-sister; unspecified number   Colon polyps Sister 51   Colon cancer Sister        paternal half-sister dx. before age 57; has had two colon cancers   Breast cancer Sister        paternal half-sister dx under age 47   Renal cancer Sister 50       paternal half-sister dx. w/ LRC - lymphoid renal cancer   Cancer Sister 51       paternal half-sister dx cancer of wrist that was not a skin cancer   Colon cancer Paternal Aunt        (x2) paternal aunts with colon cancer at unspecified ages; lim info   Breast cancer Paternal Aunt        (x3) paternal aunts with breast cancer at unspecified ages; lim info   Colon cancer Paternal Uncle        dx. unspecified age; lim info   Breast cancer Maternal Grandmother        unspecified age   Diabetes Maternal Grandmother    Diabetes Paternal Grandfather    Colon polyps Son 19       has had a bleeding polyp   Cervical cancer Other 47       niece; +hysterectomy   Alcoholism Other    Obesity Other    Esophageal cancer Neg Hx    Stomach cancer Neg Hx    Rectal cancer Neg Hx      HOME MEDICATIONS: Allergies as of 08/14/2024       Reactions   Amoxicillin Anaphylaxis   All cillins pt was 23-48 years old  Has patient had a PCN reaction causing immediate rash, facial/tongue/throat swelling, SOB or lightheadedness with hypotension: yes Has patient had a PCN reaction causing severe rash involving mucus membranes or skin necrosis: unknown Has patient had a PCN reaction that required hospitalization yes Has patient had a PCN reaction occurring within the last 10 years:no If all of the above answers are NO, then may proceed with Cephalosporin use.   Ibuprofen Anaphylaxis, Hives   Keflex [cephalexin] Anaphylaxis   Penicillins Anaphylaxis   Has patient had a PCN reaction  causing immediate rash, facial/tongue/throat swelling, SOB or lightheadedness with hypotension:yes Has patient had a PCN reaction causing severe rash involving mucus membranes or skin necrosis: unknown Has patient had a PCN reaction that required hospitalization yes  Has patient had a PCN reaction occurring within the last 10 years: no If all of the above answers  are NO, then may proceed with Cephalosporin use.   Baby Powder [methylbenzethonium] Hives   Doxycycline  Diarrhea, Rash        Medication List        Accurate as of August 14, 2024  9:00 AM. If you have any questions, ask your nurse or doctor.          albuterol  108 (90 Base) MCG/ACT inhaler Commonly known as: Proventil  HFA Inhale 2 puffs into the lungs every 6 (six) hours as needed for wheezing or shortness of breath.   Cequa 0.09 % Soln Generic drug: cycloSPORINE (PF) Apply 1 drop to eye 2 (two) times daily.   CULTURELLE PROBIOTICS PO Take 1 tablet by mouth daily at 6 (six) AM.   docusate 60 MG/15ML syrup Commonly known as: COLACE Take 60 mg by mouth daily.   EPINEPHrine  0.3 mg/0.3 mL Soaj injection Commonly known as: EpiPen  2-Pak Inject 0.3 mg into the muscle as needed for anaphylaxis.   furosemide  20 MG tablet Commonly known as: LASIX  Take 0.5 tablets (10 mg total) by mouth daily.   Latisse 0.03 % ophthalmic solution Generic drug: bimatoprost Apply 1 drop via applicator once a day as directed   metFORMIN  500 MG tablet Commonly known as: GLUCOPHAGE  1 po with lunch daily   methimazole  5 MG tablet Commonly known as: TAPAZOLE  Take one-half  tablet (2.5 mg total) by mouth 4 days a week as directed.   Mounjaro  15 MG/0.5ML Pen Generic drug: tirzepatide  Inject 15 mg into the skin once a week.   MULTIVITAMIN PO Take 1 tablet by mouth daily at 6 (six) AM.   neomycin -polymyxin-hydrocortisone  3.5-10000-1 OTIC suspension Commonly known as: CORTISPORIN Place 3-4 drops into the right ear 3 (three)  times daily. For 1 week.   pantoprazole  40 MG tablet Commonly known as: PROTONIX  Take 1 tablet (40 mg total) by mouth daily.   promethazine  25 MG tablet Commonly known as: PHENERGAN  TAKE ONE TABLET BY MOUTH EVERY SIX HOURS AS NEEDED FOR NAUSEA & VOMITING What changed: See the new instructions.   valACYclovir  1000 MG tablet Commonly known as: VALTREX  Take 4,000 mg by mouth once.   Vitamin D3 25 MCG (1000 UT) Caps Pt states she takes 2000IU every day-->  change to 2,000 IU every other day during the summer months          OBJECTIVE:   PHYSICAL EXAM: VS: BP 110/74 (BP Location: Left Arm, Patient Position: Sitting, Cuff Size: Large)   Pulse 75   Ht 5' 1 (1.549 m)   Wt 134 lb (60.8 kg)   SpO2 94%   BMI 25.32 kg/m     Filed Weights   08/14/24 0858  Weight: 134 lb (60.8 kg)       EXAM: General: Pt appears well and is in NAD  Neck: General: Supple without adenopathy. Thyroid : Thyroid  size normal.  No goiter or nodules appreciated.   Lungs: Clear with good BS bilat   Heart: Auscultation: RRR.  Abdomen:  soft, nontender  Extremities:  BL LE: No pretibial edema   Mental Status: Judgment, insight: Intact Orientation: Oriented to time, place, and person Mood and affect: No depression, anxiety, or agitation     DATA REVIEWED:   Latest Reference Range & Units 08/14/24 09:23  TSH mIU/L 1.02  Triiodothyronine,Free,Serum 2.3 - 4.2 pg/mL 3.7  T4,Free(Direct) 0.8 - 1.8 ng/dL 1.4      Latest Reference Range & Units 11/16/21 08:21  TRAB <=2.00 IU/L 2.66 (H)    Latest Reference Range &  Units 12/03/22 09:10  Sodium 134 - 144 mmol/L 141  Potassium 3.5 - 5.2 mmol/L 4.8  Chloride 96 - 106 mmol/L 102  CO2 20 - 29 mmol/L 23  Glucose 70 - 99 mg/dL 81  BUN 6 - 24 mg/dL 8  Creatinine 9.42 - 8.99 mg/dL 9.17  Calcium 8.7 - 89.7 mg/dL 89.7  BUN/Creatinine Ratio 9 - 23  10  eGFR >59 mL/min/1.73 85  Alkaline Phosphatase 44 - 121 IU/L 105  Albumin 3.8 - 4.9 g/dL 4.9   Albumin/Globulin Ratio 1.2 - 2.2  2.1  AST 0 - 40 IU/L 30  ALT 0 - 32 IU/L 50 (H)  Total Protein 6.0 - 8.5 g/dL 7.2  Total Bilirubin 0.0 - 1.2 mg/dL 0.5     Thyroid  ultrasound 02/20/2024 Estimated total number of nodules >/= 1 cm: 0   Number of spongiform nodules >/=  2 cm not described below (TR1): 0   Number of mixed cystic and solid nodules >/= 1.5 cm not described below (TR2): 0   _________________________________________________________   No discrete nodules of consequence are seen within the thyroid  gland. Small subcentimeter nodules are noted incidentally in the right upper and lower gland but do not meet criteria for further evaluation. No interval change compared to prior.   IMPRESSION: Mildly heterogeneous and enlarged thyroid  gland containing small thyroid  nodules which are considered incidental and do not warrant further evaluation.      ASSESSMENT / PLAN / RECOMMENDATIONS:   Hyperthyroidism:  -Patient is clinically euthyroid - Hyperthyroidism is due to Graves' disease as there was slight elevation in TRAb - TFTs remain normal, no change   Medications   Continue methimazole  5 mg, half a tablet 4 days a week   2.  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  Multinodular goiter:  -Thyroid  ultrasound 10/2021 showed multiple nodules that did not meet criteria for biopsy, repeat 10/2022 showed stability  - Repeat thyroid  ultrasound in April, 2025 showed mild heterogeneity of the thyroid  gland with subcentimeter nodules not met criteria for follow-up - No local neck symptoms   Follow-up in 6 months    Signed electronically by: Stefano Redgie Butts, MD  [facility]  [facility] [address] [place] [place], [place] [address] Phone: [phone] FAX: [phone]      CC: Whitney Reyes SAUNDERS, MD [address]  [place] [address] Phone: [phone]  Fax: [phone]   Return to Endocrinology clinic as below: Future Appointments  Date Time Provider Department Center  09/14/2024 10:00 AM Midge Sober, DO [facility] None  06/17/2025  8:00 AM Whitney Reyes SAUNDERS, MD [facility]

## 2024-08-18 ENCOUNTER — Ambulatory Visit: Payer: Self-pay | Admitting: Internal Medicine

## 2024-08-18 MED ORDER — METHIMAZOLE 5 MG PO TABS: 2.5000 mg | ORAL_TABLET | ORAL | 3 refills | Status: AC

## 2024-08-25 DIAGNOSIS — E0500 Thyrotoxicosis with diffuse goiter without thyrotoxic crisis or storm: Secondary | ICD-10-CM | POA: Diagnosis not present

## 2024-08-25 DIAGNOSIS — H53483 Generalized contraction of visual field, bilateral: Secondary | ICD-10-CM | POA: Diagnosis not present

## 2024-08-25 DIAGNOSIS — H02423 Myogenic ptosis of bilateral eyelids: Secondary | ICD-10-CM | POA: Diagnosis not present

## 2024-08-25 DIAGNOSIS — H0279 Other degenerative disorders of eyelid and periocular area: Secondary | ICD-10-CM | POA: Diagnosis not present

## 2024-08-25 DIAGNOSIS — H02834 Dermatochalasis of left upper eyelid: Secondary | ICD-10-CM | POA: Diagnosis not present

## 2024-08-25 DIAGNOSIS — H05241 Constant exophthalmos, right eye: Secondary | ICD-10-CM | POA: Diagnosis not present

## 2024-08-25 DIAGNOSIS — H02421 Myogenic ptosis of right eyelid: Secondary | ICD-10-CM | POA: Diagnosis not present

## 2024-08-25 DIAGNOSIS — H02422 Myogenic ptosis of left eyelid: Secondary | ICD-10-CM | POA: Diagnosis not present

## 2024-08-25 DIAGNOSIS — H02831 Dermatochalasis of right upper eyelid: Secondary | ICD-10-CM | POA: Diagnosis not present

## 2024-09-14 ENCOUNTER — Ambulatory Visit (INDEPENDENT_AMBULATORY_CARE_PROVIDER_SITE_OTHER): Payer: Self-pay | Admitting: Family Medicine

## 2024-09-14 ENCOUNTER — Encounter (INDEPENDENT_AMBULATORY_CARE_PROVIDER_SITE_OTHER): Payer: Self-pay | Admitting: Family Medicine

## 2024-09-14 DIAGNOSIS — R7303 Prediabetes: Secondary | ICD-10-CM | POA: Diagnosis not present

## 2024-09-14 DIAGNOSIS — E559 Vitamin D deficiency, unspecified: Secondary | ICD-10-CM

## 2024-09-14 DIAGNOSIS — Z6824 Body mass index (BMI) 24.0-24.9, adult: Secondary | ICD-10-CM | POA: Diagnosis not present

## 2024-09-14 MED ORDER — METFORMIN HCL 500 MG PO TABS: ORAL_TABLET | ORAL | 0 refills | Status: DC

## 2024-09-14 MED ORDER — MOUNJARO 15 MG/0.5ML ~~LOC~~ SOAJ: 15.0000 mg | SUBCUTANEOUS | 2 refills | Status: DC

## 2024-09-14 NOTE — Progress Notes (Signed)
 Whitney DOROTHA [name], D.O.  ABFM, ABOM Specializing in Clinical Bariatric Medicine  Office located at: [address] [place], [place]  [address]      A) FOR THE CHRONIC DISEASE OF OBESITY:  Chief complaint: Obesity [name] [name] here to discuss her progress with her obesity treatment plan.   History of present illness / Interval history:  [name] [name] adelene today for her follow-up office visit.  Since last OV on 06/22/2024, pt weight [name] unchanged.   Had eye surgery three weeks ago which has made it difficult to adhere to health goals.   06/22/24 09:00 09/14/24 09:00   Body Fat % 33.3 % 33.8 %  Muscle Mass (lbs) 82 lbs 81.2 lbs  Fat Mass (lbs) 43.2 lbs 43.6 lbs  Total Body Water  (lbs) 58.8 lbs 59.2 lbs  Visceral Fat Rating  7 7  Counseling done on how various foods will affect these numbers and how to maximize success   Total lbs lost to date: -61 lbs Total Fat Mass in lbs lost to date: -37.4 Total weight loss percentage to date: -32.11 %   Nutrition Therapy She [name] keeping a food journal and adhering to recommended goals of [phone] calories and 80+ g of protein and states she [name] following her eating plan approximately 50 % of the time.   - Tracking Calories/Macros: no -    - Eating More Whole Foods: yes  - Adequate Protein Intake: no -    - Adequate Water  Intake: yes  - Skipping Meals: yes  - Sleeping 7-9 Hours/ Night: no - couldn't lay flat, so she had to sleep sitting up.    [name] currently in the action stage of change. As such, her goal [name] to continue weight management plan.  She has agreed to: continue current plan   Physical Activity Pt [name] not exercising.   [name] has been advised to work up to 300-450 minutes of moderate intensity aerobic activity a week and strengthening exercises 2-3 times per week for cardiovascular health, weight loss maintenance and preservation of muscle mass.  She has agreed to : Think about enjoyable ways to  increase daily physical activity and overcoming barriers to exercise, Increase physical activity in their day and reduce sedentary time (increase NEAT)., Increase volume of physical activity to a goal of 240 minutes a week, and Combine aerobic and strengthening exercises for efficiency and improved cardiometabolic health.   Behavioral Modifications Evidence-based interventions for health behavior change were utilized today including the discussion of  1) self monitoring techniques:  none 2) problem-solving barriers:  Had eye surgery recently, so its been difficult to adhere to exercise and has not been adhering to diet. 3) self care:  exercise 4) SMART goals for next OV:  Increase exercise 10,000-15,000 steps.  Regarding patient's less desirable eating habits and patterns, we employed the technique of small changes.   We discussed the following today: avoiding skipping meals, increasing water  intake , continue to work on implementation of reduced calorie nutritional plan, and continue to practice mindfulness when eating Additional resources provided today: Thanksgiving tips and tricks    Medical Interventions/ Pharmacotherapy Previous Bariatric surgery: none Pharmacotherapy for weight loss: She [name] currently taking Metformin  500 mg once daily and Mounjaro  15 mg once weekly  for medical weight loss.    We discussed various medication options to help [name] with her weight loss efforts and we both agreed to : Continue with current nutritional and behavioral strategies   B)  OBESITY RELATED CONDITIONS ADDRESSED TODAY:    Morbid obesity (HCC)-start bmi 35.90 BMI 24.0-24.9, adult-current 24.39   Prediabetes Assessment & Plan Lab Results  Component Value Date   HGBA1C 5.4 02/27/2024   HGBA1C 5.6 09/23/2023   HGBA1C 5.6 04/25/2023   INSULIN  5.8 09/23/2023   INSULIN  11.9 04/25/2023   INSULIN  6.7 12/03/2022  Currently on Metformin  500 mg daily and Mounjaro  15 mg once weekly with good  compliance and tolerance. Per surgeon's instruction pt was off Mounjaro  a week prior to surgery and a week after surgery.  Denies any side effects, such as GI upset. Reports having decreased appetite, due to surgery. Cont current medication(refill today). Cont decreasing simple carbs/sugars and increasing lean protein.     Vitamin D  deficiency Assessment & Plan  Lab Results  Component Value Date   VD25OH 57.9 02/27/2024   VD25OH 49.1 09/23/2023   VD25OH 51.9 04/25/2023  Currently on OTC Vit D 2,000 international units every other day. Vit D levels at goal. No acute concerns. Cont same regimen (refill today). Recheck as necessary.      Medications Discontinued During This Encounter  Medication Reason   MOUNJARO  15 MG/0.5ML Pen Reorder   metFORMIN  (GLUCOPHAGE ) 500 MG tablet Reorder     Meds ordered this encounter  Medications   metFORMIN  (GLUCOPHAGE ) 500 MG tablet    Sig: 1 po with lunch daily    Dispense:  90 tablet    Refill:  0    Do not send RF request   MOUNJARO  15 MG/0.5ML Pen    Sig: Inject 15 mg into the skin once a week.    Dispense:  2 mL    Refill:  2      Follow up:   Return 11/12/2024 9:40 AM.  She was informed of the importance of frequent follow up visits to maximize her success with intensive lifestyle modifications for her multiple health conditions.   Weight Summary and Biometrics   Weight Lost Since Last Visit: 0lb  Weight Gained Since Last Visit: 0lb    Vitals Temp: 97.7 F (36.5 C) BP: 111/72 Pulse Rate: 73 SpO2: 98 %   Anthropometric Measurements Height: 5' 1 (1.549 m) Weight: 129 lb (58.5 kg) BMI (Calculated): 24.39 Weight at Last Visit: 129lb Weight Lost Since Last Visit: 0lb Weight Gained Since Last Visit: 0lb Starting Weight: 190lb Total Weight Loss (lbs): 61 lb (27.7 kg)   Body Composition  Body Fat %: 33.8 % Fat Mass (lbs): 43.6 lbs Muscle Mass (lbs): 81.2 lbs Total Body Water  (lbs): 59.2 lbs Visceral Fat Rating :  7   Other Clinical Data Fasting: no Labs: no Today's Visit #: 33 Starting Date: 11/29/21    Objective:   PHYSICAL EXAM: Blood pressure 111/72, pulse 73, temperature 97.7 F (36.5 C), height 5' 1 (1.549 m), weight 129 lb (58.5 kg), SpO2 98%. Body mass index [name] 24.37 kg/m.  General: she [name] overweight, cooperative and in no acute distress. PSYCH: Has normal mood, affect and thought process.   HEENT: EOMI, sclerae are anicteric. Lungs: Normal breathing effort, no conversational dyspnea. Extremities: Moves * 4 Neurologic: A and O * 3, good insight  DIAGNOSTIC DATA REVIEWED: BMET    Component Value Date/Time   NA 142 02/27/2024 0750   K 4.6 02/27/2024 0750   CL 102 02/27/2024 0750   CO2 25 02/27/2024 0750   GLUCOSE 91 02/27/2024 0750   GLUCOSE 83 01/02/2022 1216   BUN 12 02/27/2024 0750   CREATININE 0.83 02/27/2024 0750  CALCIUM 10.0 02/27/2024 0750   GFRNONAA >60 11/05/2019 0115   GFRAA >60 11/05/2019 0115   Lab Results  Component Value Date   HGBA1C 5.4 02/27/2024   HGBA1C 6.3 09/26/2021   Lab Results  Component Value Date   INSULIN  5.8 09/23/2023   INSULIN  7.7 05/24/2022   Lab Results  Component Value Date   TSH 1.02 08/14/2024   CBC    Component Value Date/Time   WBC 6.9 11/16/2021 0821   RBC 5.31 (H) 11/16/2021 0821   HGB 13.5 11/16/2021 0821   HCT 43.2 11/16/2021 0821   PLT 327.0 11/16/2021 0821   MCV 81.4 11/16/2021 0821   MCH 26.6 11/05/2019 0115   MCHC 31.3 11/16/2021 0821   RDW 15.3 11/16/2021 0821   Iron Studies No results found for: IRON, TIBC, FERRITIN, IRONPCTSAT Lipid Panel     Component Value Date/Time   CHOL 228 (H) 02/27/2024 0750   TRIG 106 02/27/2024 0750   HDL 58 02/27/2024 0750   CHOLHDL 3.9 02/27/2024 0750   CHOLHDL 4 09/26/2021 0759   VLDL 27.0 09/26/2021 0759   LDLCALC 151 (H) 02/27/2024 0750   Hepatic Function Panel     Component Value Date/Time   PROT 6.3 02/27/2024 0750   ALBUMIN 4.6 02/27/2024 0750    AST 21 02/27/2024 0750   ALT 21 02/27/2024 0750   ALKPHOS 94 02/27/2024 0750   BILITOT 0.3 02/27/2024 0750   BILIDIR 0.1 04/20/2008 1145      Component Value Date/Time   TSH 1.02 08/14/2024 0923   Nutritional Lab Results  Component Value Date   VD25OH 57.9 02/27/2024   VD25OH 49.1 09/23/2023   VD25OH 51.9 04/25/2023    Attestations:   I, Feliciano Mingle, acting as a stage manager for [employer], DO., have compiled all relevant documentation for today's office visit on behalf of Whitney Jenkins, DO, while in the presence of [employer], DO.   I have reviewed the above documentation for accuracy and completeness, and I agree with the above. [name], D.O.  The 21st Century Cures Act was signed into law in 2016 which includes the topic of electronic health records.  This provides immediate access to information in MyChart.  This includes consultation notes, operative notes, office notes, lab results and pathology reports.  If you have any questions about what you read please let us  know at your next visit so we can discuss your concerns and take corrective action if need be.  We are right here with you.

## 2024-10-16 DIAGNOSIS — M7542 Impingement syndrome of left shoulder: Secondary | ICD-10-CM | POA: Diagnosis not present

## 2024-11-04 ENCOUNTER — Telehealth: Payer: Self-pay | Admitting: Family Medicine

## 2024-11-04 DIAGNOSIS — T753XXD Motion sickness, subsequent encounter: Secondary | ICD-10-CM

## 2024-11-04 MED ORDER — PROMETHAZINE HCL 25 MG PO TABS: 25.0000 mg | ORAL_TABLET | Freq: Four times a day (QID) | ORAL | 0 refills | Status: AC | PRN

## 2024-11-04 NOTE — Addendum Note (Signed)
 Addended by: Jamye Balicki R on: 11/04/2024 11:16 AM   Modules accepted: Orders

## 2024-11-04 NOTE — Telephone Encounter (Signed)
"  Okay to refill?  "

## 2024-11-04 NOTE — Telephone Encounter (Signed)
 Patient is requesting her medication Promethazine  25 mg today. She is also requesting it [facility] to [facility]. Can you please call her when its send out [phone]

## 2024-11-04 NOTE — Telephone Encounter (Signed)
 Chart reviewed, has used promethazine  as needed for nausea, motion sickness.  Last filled in May.  Refill ordered.

## 2024-11-12 ENCOUNTER — Encounter (INDEPENDENT_AMBULATORY_CARE_PROVIDER_SITE_OTHER): Payer: Self-pay | Admitting: Family Medicine

## 2024-11-12 ENCOUNTER — Ambulatory Visit (INDEPENDENT_AMBULATORY_CARE_PROVIDER_SITE_OTHER): Admitting: Family Medicine

## 2024-11-12 DIAGNOSIS — E559 Vitamin D deficiency, unspecified: Secondary | ICD-10-CM | POA: Diagnosis not present

## 2024-11-12 DIAGNOSIS — R7303 Prediabetes: Secondary | ICD-10-CM | POA: Diagnosis not present

## 2024-11-12 DIAGNOSIS — E042 Nontoxic multinodular goiter: Secondary | ICD-10-CM

## 2024-11-12 DIAGNOSIS — D490 Neoplasm of unspecified behavior of digestive system: Secondary | ICD-10-CM

## 2024-11-12 DIAGNOSIS — Z6823 Body mass index (BMI) 23.0-23.9, adult: Secondary | ICD-10-CM

## 2024-11-12 DIAGNOSIS — Z6824 Body mass index (BMI) 24.0-24.9, adult: Secondary | ICD-10-CM

## 2024-11-12 DIAGNOSIS — Z905 Acquired absence of kidney: Secondary | ICD-10-CM

## 2024-11-12 MED ORDER — MOUNJARO 15 MG/0.5ML ~~LOC~~ SOAJ: 15.0000 mg | SUBCUTANEOUS | 3 refills | Status: AC

## 2024-11-12 MED ORDER — METFORMIN HCL 500 MG PO TABS: ORAL_TABLET | ORAL | 0 refills | Status: AC

## 2024-11-12 NOTE — Progress Notes (Signed)
 "  [name], D.O.  ABFM, ABOM Specializing in Clinical Bariatric Medicine  Office located at: [address] [place], [place]  [address]     No orders of the defined types were placed in this encounter.   Medications Discontinued During This Encounter  Medication Reason   metFORMIN  (GLUCOPHAGE ) 500 MG tablet Reorder   MOUNJARO  15 MG/0.5ML Pen Reorder     Meds ordered this encounter  Medications   MOUNJARO  15 MG/0.5ML Pen    Sig: Inject 15 mg into the skin once a week.    Dispense:  2 mL    Refill:  3   metFORMIN  (GLUCOPHAGE ) 500 MG tablet    Sig: 1 po with lunch daily    Dispense:  90 tablet    Refill:  0    Do not send RF request     A) FOR THE CHRONIC DISEASE OF OBESITY:  Chief complaint: Obesity Wednesday is here to discuss her progress with her obesity treatment plan.   History of present illness / Interval history:  [name] is here today for her follow-up office visit.  Since last OV on 09/14/24, pt is down 4 lbs.   -Patient states that she has been sick as well as her husband and grand kids. She has been sick since Christmas.  Skipped foods/ meals d/t this and she is not surprised by her muscle loss today as she could not exercise as much as usual   09/14/24 09:00 11/12/24 09:00   Body Fat % 33.8 % 33.6 %  Muscle Mass (lbs) 81.2 lbs 79 lbs  Fat Mass (lbs) 43.6 lbs 42.2 lbs  Total Body Water  (lbs) 59.2 lbs 57 lbs  Visceral Fat Rating  7 7   Counseling done on how various foods lifestyle factors will affect these numbers and how to maximize success for adipose tissue loss.  Total lbs lost to date: - 65 lbs Total Fat Mass in lbs lost to date: - 38.8 lbs Total weight loss percentage to date: - 34.21 %    Morbid obesity (HCC)-start bmi 35.90 BMI 24.0-24.9, adult-current 23.63  Nutrition Therapy She is keeping a food journal and adhering to recommended goals of [phone] calories and 80+ g of protein and states she is following her  eating plan approximately 80 % of the time.   - Tracking Calories/Macros: no   - Eating More Whole Foods: yes  - Adequate Protein Intake: yes  - Adequate Water  Intake: no   - Skipping Meals: no   - Sleeping 7-9 Hours/ Night: no - d/t illness   [name] is currently in the action stage of change. As such, her goal is to continue weight management plan.  She has agreed to: continue current plan   Physical Activity [name] is walking or doing weight 240   minutes 5 or 6 days per week   [name] has been advised to work up to 300-450 minutes of moderate intensity aerobic activity a week and strengthening exercises 2-3 times per week for cardiovascular health, weight loss maintenance and preservation of muscle mass.  She has agreed to : pt intends to return to previous exercise levels of 450 minutes cardio per week and 2-3 d/wk resistance training for next OV   Behavioral Modifications Evidence-based interventions for health behavior change were utilized today including the discussion of  1) self monitoring techniques:  get back to journaling Regarding patient's less desirable eating habits and patterns, we employed the technique of small changes.  We discussed the following today:  - continue to work on maintaining a reduced calorie state,  - getting the recommended amount of protein,  - incorporating whole foods, making healthy choices  - staying well hydrated  Additional resources provided today: None   Medical Interventions/ Pharmacotherapy Previous Bariatric surgery: n/a Pharmacotherapy for weight loss: She is currently taking Metformin  500 mg daily and Mounjaro  15 mg once weekly for medical weight loss.    On Mounjaro  15 mg once weekly with reported good compliance and tolerance. Patient denies having and loose stools but reports having some constipation.  She reports taking some Miralax  to help with the constipation. While she was sick she drank some Sprite and she states that  she thinks it made her constipation worse.  Reminded patient of the importance of eating on plan.     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 Thus [name]'s ongoing GLP-1/ GIP treatment for her chronic, progressive disease of obesity with ongoing risk for obesity-related morbidity, is consider a high medical risk and requires advanced medical decision making.  Management of these prescription medications with potential for significant adverse effects places the patient at HIGH risk of morbidity. Thus longitudinal, regular follow-up is deemed necessary.   We also discussed various lifestyle and dietary modifications to help [name] with her weight loss efforts and with shared decision making, we both agreed to : Adequate clinical response to anti-obesity medication, continue current anti-obesity regimen along with intensive dietary and lifestyle changes   B) OBESITY RELATED CONDITIONS ADDRESSED TODAY:  Prediabetes Assessment & Plan Lab Results  Component Value Date   HGBA1C 5.4 02/27/2024   HGBA1C 5.6 09/23/2023   HGBA1C 5.6 04/25/2023   INSULIN  5.8 09/23/2023   INSULIN  11.9 04/25/2023   INSULIN  6.7 12/03/2022    On Metformin  500 mg once daily with reported good compliance and tolerance.  No complaints of excessive hunger or cravings. Will refill today. Continue following prudent meal plan and decreasing simple carbs and sugars.    Vitamin D  deficiency Assessment & Plan Lab Results  Component Value Date   VD25OH 57.9 02/27/2024   VD25OH 49.1 09/23/2023   VD25OH 51.9 04/25/2023   On OTC Vit D 2000 lU units every other day with reported good compliance and tolerance. Reminded patient the importance of having at goal Vit D levels. Continue with supplementation. Will recheck Vit D levels in the near future.    Medications Discontinued During This Encounter   Medication Reason   metFORMIN  (GLUCOPHAGE ) 500 MG tablet Reorder   MOUNJARO  15 MG/0.5ML Pen Reorder     Meds ordered this encounter  Medications   MOUNJARO  15 MG/0.5ML Pen    Sig: Inject 15 mg into the skin once a week.    Dispense:  2 mL    Refill:  3   metFORMIN  (GLUCOPHAGE ) 500 MG tablet    Sig: 1 po with lunch daily    Dispense:  90 tablet    Refill:  0    Do not send RF request      Follow up:   Return 03/09/2025 at 10:40 AM  She was informed of the importance of frequent follow up visits to maximize her success with intensive lifestyle modifications for her multiple health conditions.   Weight Summary and Biometrics   Weight Lost Since Last Visit: 4lb  Weight Gained Since Last Visit: 0lb  Vitals Temp: 97.9 F (36.6 C) BP: 102/72 Pulse Rate: 83 SpO2: 99 %   Anthropometric Measurements Height: 5' 1 (1.549 m) Weight: 125 lb (56.7 kg) BMI (Calculated): 23.63 Weight at Last Visit: 129lb Weight Lost Since Last Visit: 4lb Weight Gained Since Last Visit: 0lb Starting Weight: 190lb Total Weight Loss (lbs): 57 lb (25.9 kg)   Body Composition  Body Fat %: 33.6 % Fat Mass (lbs): 42.2 lbs Muscle Mass (lbs): 79 lbs Total Body Water  (lbs): 57 lbs Visceral Fat Rating : 7   Other Clinical Data Fasting: no Labs: no Today's Visit #: 34 Starting Date: 11/29/21    Objective:   PHYSICAL EXAM: Blood pressure 102/72, pulse 83, temperature 97.9 F (36.6 C), height 5' 1 (1.549 m), weight 125 lb (56.7 kg), SpO2 99%. Body mass index is 23.62 kg/m.  General: she is overweight, cooperative and in no acute distress. PSYCH: Has normal mood, affect and thought process.   HEENT: EOMI, sclerae are anicteric. Lungs: Normal breathing effort, no conversational dyspnea. Extremities: Moves * 4 Neurologic: A and O * 3, good insight  DIAGNOSTIC DATA REVIEWED: BMET    Component Value Date/Time   NA 142 02/27/2024 0750   K 4.6 02/27/2024 0750   CL 102 02/27/2024  0750   CO2 25 02/27/2024 0750   GLUCOSE 91 02/27/2024 0750   GLUCOSE 83 01/02/2022 1216   BUN 12 02/27/2024 0750   CREATININE 0.83 02/27/2024 0750   CALCIUM 10.0 02/27/2024 0750   GFRNONAA >60 11/05/2019 0115   GFRAA >60 11/05/2019 0115   Lab Results  Component Value Date   HGBA1C 5.4 02/27/2024   HGBA1C 6.3 09/26/2021   Lab Results  Component Value Date   INSULIN  5.8 09/23/2023   INSULIN  7.7 05/24/2022   Lab Results  Component Value Date   TSH 1.02 08/14/2024   CBC    Component Value Date/Time   WBC 6.9 11/16/2021 0821   RBC 5.31 (H) 11/16/2021 0821   HGB 13.5 11/16/2021 0821   HCT 43.2 11/16/2021 0821   PLT 327.0 11/16/2021 0821   MCV 81.4 11/16/2021 0821   MCH 26.6 11/05/2019 0115   MCHC 31.3 11/16/2021 0821   RDW 15.3 11/16/2021 0821   Iron Studies No results found for: IRON, TIBC, FERRITIN, IRONPCTSAT Lipid Panel     Component Value Date/Time   CHOL 228 (H) 02/27/2024 0750   TRIG 106 02/27/2024 0750   HDL 58 02/27/2024 0750   CHOLHDL 3.9 02/27/2024 0750   CHOLHDL 4 09/26/2021 0759   VLDL 27.0 09/26/2021 0759   LDLCALC 151 (H) 02/27/2024 0750   Hepatic Function Panel     Component Value Date/Time   PROT 6.3 02/27/2024 0750   ALBUMIN 4.6 02/27/2024 0750   AST 21 02/27/2024 0750   ALT 21 02/27/2024 0750   ALKPHOS 94 02/27/2024 0750   BILITOT 0.3 02/27/2024 0750   BILIDIR 0.1 04/20/2008 1145      Component Value Date/Time   TSH 1.02 08/14/2024 0923   Nutritional Lab Results  Component Value Date   VD25OH 57.9 02/27/2024   VD25OH 49.1 09/23/2023   VD25OH 51.9 04/25/2023    Attestations:   I, Sonny Laroche, acting as a stage manager for Whitney Jenkins, DO., have compiled all relevant documentation for today's office visit on behalf of Whitney Jenkins, DO, while in the presence of [employer], DO.  I have reviewed the above documentation for accuracy and completeness, and I agree with the above. Whitney JINNY [name], D.O.  The  21st Century Cures Act was signed into law in 2016 which includes the topic of electronic health records.  This provides immediate access to information in MyChart.  This includes consultation notes, operative notes, office notes, lab results and pathology reports.  If you have any questions about what you read please let us  know at your next visit so we can discuss your concerns and take corrective action if need be.  We are right here with you.  "

## 2025-02-12 ENCOUNTER — Ambulatory Visit: Admitting: Internal Medicine

## 2025-03-09 ENCOUNTER — Ambulatory Visit (INDEPENDENT_AMBULATORY_CARE_PROVIDER_SITE_OTHER): Admitting: Family Medicine

## 2025-06-17 ENCOUNTER — Encounter: Admitting: Family Medicine
